# Patient Record
Sex: Female | Born: 1975
Health system: Southern US, Community
[De-identification: ages and names within clinical notes are randomized; demographics above are authoritative.]

## PROBLEM LIST (undated history)

## (undated) DIAGNOSIS — F32A Depression, unspecified: Secondary | ICD-10-CM

## (undated) DIAGNOSIS — R079 Chest pain, unspecified: Secondary | ICD-10-CM

## (undated) DIAGNOSIS — R102 Pelvic and perineal pain: Secondary | ICD-10-CM

## (undated) DIAGNOSIS — K219 Gastro-esophageal reflux disease without esophagitis: Secondary | ICD-10-CM

## (undated) DIAGNOSIS — R51 Headache: Secondary | ICD-10-CM

## (undated) DIAGNOSIS — N289 Disorder of kidney and ureter, unspecified: Secondary | ICD-10-CM

## (undated) DIAGNOSIS — G8929 Other chronic pain: Secondary | ICD-10-CM

## (undated) DIAGNOSIS — M5030 Other cervical disc degeneration, unspecified cervical region: Secondary | ICD-10-CM

## (undated) DIAGNOSIS — G629 Polyneuropathy, unspecified: Secondary | ICD-10-CM

## (undated) DIAGNOSIS — R42 Dizziness and giddiness: Secondary | ICD-10-CM

## (undated) DIAGNOSIS — Z9289 Personal history of other medical treatment: Secondary | ICD-10-CM

## (undated) DIAGNOSIS — I10 Essential (primary) hypertension: Secondary | ICD-10-CM

## (undated) DIAGNOSIS — F329 Major depressive disorder, single episode, unspecified: Secondary | ICD-10-CM

## (undated) DIAGNOSIS — R519 Headache, unspecified: Secondary | ICD-10-CM

## (undated) DIAGNOSIS — K760 Fatty (change of) liver, not elsewhere classified: Secondary | ICD-10-CM

## (undated) HISTORY — DX: Fatty (change of) liver, not elsewhere classified: K76.0

## (undated) HISTORY — DX: Major depressive disorder, single episode, unspecified: F32.9

## (undated) HISTORY — DX: Depression, unspecified: F32.A

## (undated) HISTORY — PX: ROTATOR CUFF REPAIR: SHX139

## (undated) HISTORY — PX: BREAST BIOPSY: SHX20

## (undated) HISTORY — PX: TUBAL LIGATION: SHX77

## (undated) HISTORY — DX: Gastro-esophageal reflux disease without esophagitis: K21.9

---

## 1998-11-03 ENCOUNTER — Ambulatory Visit (HOSPITAL_COMMUNITY): Admission: RE | Admit: 1998-11-03 | Discharge: 1998-11-03 | Payer: Self-pay | Admitting: *Deleted

## 1998-12-08 ENCOUNTER — Inpatient Hospital Stay (HOSPITAL_COMMUNITY): Admission: AD | Admit: 1998-12-08 | Discharge: 1998-12-08 | Payer: Self-pay | Admitting: *Deleted

## 1999-01-14 ENCOUNTER — Ambulatory Visit (HOSPITAL_COMMUNITY): Admission: RE | Admit: 1999-01-14 | Discharge: 1999-01-14 | Payer: Self-pay | Admitting: Obstetrics

## 1999-02-20 ENCOUNTER — Inpatient Hospital Stay (HOSPITAL_COMMUNITY): Admission: AD | Admit: 1999-02-20 | Discharge: 1999-02-20 | Payer: Self-pay | Admitting: *Deleted

## 1999-04-01 ENCOUNTER — Encounter: Payer: Self-pay | Admitting: Obstetrics & Gynecology

## 1999-04-01 ENCOUNTER — Inpatient Hospital Stay (HOSPITAL_COMMUNITY): Admission: AD | Admit: 1999-04-01 | Discharge: 1999-04-07 | Payer: Self-pay | Admitting: Obstetrics & Gynecology

## 1999-04-15 ENCOUNTER — Inpatient Hospital Stay (HOSPITAL_COMMUNITY): Admission: AD | Admit: 1999-04-15 | Discharge: 1999-04-15 | Payer: Self-pay | Admitting: Obstetrics & Gynecology

## 2003-06-23 ENCOUNTER — Emergency Department (HOSPITAL_COMMUNITY): Admission: AD | Admit: 2003-06-23 | Discharge: 2003-06-23 | Payer: Self-pay | Admitting: Family Medicine

## 2003-06-25 ENCOUNTER — Ambulatory Visit (HOSPITAL_COMMUNITY): Admission: RE | Admit: 2003-06-25 | Discharge: 2003-06-25 | Payer: Self-pay | Admitting: Family Medicine

## 2003-12-02 ENCOUNTER — Ambulatory Visit: Payer: Self-pay | Admitting: Nurse Practitioner

## 2004-01-02 ENCOUNTER — Ambulatory Visit: Payer: Self-pay | Admitting: Nurse Practitioner

## 2004-01-28 ENCOUNTER — Ambulatory Visit: Payer: Self-pay | Admitting: Nurse Practitioner

## 2004-08-13 ENCOUNTER — Ambulatory Visit (HOSPITAL_COMMUNITY): Admission: RE | Admit: 2004-08-13 | Discharge: 2004-08-13 | Payer: Self-pay | Admitting: *Deleted

## 2004-10-21 ENCOUNTER — Ambulatory Visit: Payer: Self-pay | Admitting: *Deleted

## 2004-10-27 ENCOUNTER — Ambulatory Visit (HOSPITAL_COMMUNITY): Admission: RE | Admit: 2004-10-27 | Discharge: 2004-10-27 | Payer: Self-pay | Admitting: *Deleted

## 2004-10-27 ENCOUNTER — Ambulatory Visit: Payer: Self-pay | Admitting: *Deleted

## 2004-12-22 ENCOUNTER — Ambulatory Visit: Payer: Self-pay | Admitting: *Deleted

## 2004-12-24 ENCOUNTER — Inpatient Hospital Stay (HOSPITAL_COMMUNITY): Admission: AD | Admit: 2004-12-24 | Discharge: 2004-12-27 | Payer: Self-pay | Admitting: Obstetrics & Gynecology

## 2004-12-24 ENCOUNTER — Ambulatory Visit: Payer: Self-pay | Admitting: Obstetrics & Gynecology

## 2004-12-24 ENCOUNTER — Encounter (INDEPENDENT_AMBULATORY_CARE_PROVIDER_SITE_OTHER): Payer: Self-pay | Admitting: Specialist

## 2005-06-13 ENCOUNTER — Ambulatory Visit: Payer: Self-pay | Admitting: Nurse Practitioner

## 2005-08-05 ENCOUNTER — Ambulatory Visit: Payer: Self-pay | Admitting: *Deleted

## 2007-02-19 ENCOUNTER — Encounter (INDEPENDENT_AMBULATORY_CARE_PROVIDER_SITE_OTHER): Payer: Self-pay | Admitting: Family Medicine

## 2007-02-19 ENCOUNTER — Ambulatory Visit: Payer: Self-pay | Admitting: Family Medicine

## 2007-02-19 LAB — CONVERTED CEMR LAB
Antibody Screen: NEGATIVE
Eosinophils Relative: 1 % (ref 0–5)
HCT: 39 % (ref 36.0–46.0)
Hemoglobin: 12.7 g/dL (ref 12.0–15.0)
Lymphocytes Relative: 22 % (ref 12–46)
Lymphs Abs: 1.9 10*3/uL (ref 0.7–4.0)
Monocytes Absolute: 0.4 10*3/uL (ref 0.1–1.0)
Monocytes Relative: 5 % (ref 3–12)
Neutro Abs: 6.1 10*3/uL (ref 1.7–7.7)
RBC: 4.75 M/uL (ref 3.87–5.11)
RDW: 16.6 % — ABNORMAL HIGH (ref 11.5–15.5)
Rh Type: POSITIVE
Rubella: 83.5 intl units/mL — ABNORMAL HIGH

## 2007-02-20 ENCOUNTER — Encounter: Payer: Self-pay | Admitting: Family Medicine

## 2007-02-20 ENCOUNTER — Encounter (INDEPENDENT_AMBULATORY_CARE_PROVIDER_SITE_OTHER): Payer: Self-pay | Admitting: Family Medicine

## 2007-02-20 ENCOUNTER — Ambulatory Visit: Payer: Self-pay | Admitting: Family Medicine

## 2007-02-20 DIAGNOSIS — E119 Type 2 diabetes mellitus without complications: Secondary | ICD-10-CM

## 2007-02-20 LAB — CONVERTED CEMR LAB
ALT: 25 units/L (ref 0–35)
CO2: 21 meq/L (ref 19–32)
Calcium: 8.4 mg/dL (ref 8.4–10.5)
Chloride: 108 meq/L (ref 96–112)
Creatinine, Ser: 0.45 mg/dL (ref 0.40–1.20)
Glucose, Bld: 100 mg/dL — ABNORMAL HIGH (ref 70–99)
Hgb A1c MFr Bld: 7.6 %
TSH: 1.815 microintl units/mL (ref 0.350–5.50)
Total Bilirubin: 0.4 mg/dL (ref 0.3–1.2)
Uric Acid, Serum: 2.8 mg/dL (ref 2.4–7.0)

## 2007-02-21 ENCOUNTER — Encounter (INDEPENDENT_AMBULATORY_CARE_PROVIDER_SITE_OTHER): Payer: Self-pay | Admitting: *Deleted

## 2007-02-23 ENCOUNTER — Encounter (INDEPENDENT_AMBULATORY_CARE_PROVIDER_SITE_OTHER): Payer: Self-pay | Admitting: Family Medicine

## 2007-03-05 ENCOUNTER — Encounter: Payer: Self-pay | Admitting: *Deleted

## 2007-03-06 ENCOUNTER — Ambulatory Visit: Payer: Self-pay | Admitting: Family Medicine

## 2007-03-06 LAB — CONVERTED CEMR LAB: Protein, U semiquant: NEGATIVE

## 2007-03-14 ENCOUNTER — Encounter (INDEPENDENT_AMBULATORY_CARE_PROVIDER_SITE_OTHER): Payer: Self-pay | Admitting: *Deleted

## 2007-03-19 ENCOUNTER — Encounter: Payer: Self-pay | Admitting: Obstetrics & Gynecology

## 2007-03-19 ENCOUNTER — Encounter: Admission: RE | Admit: 2007-03-19 | Discharge: 2007-03-19 | Payer: Self-pay | Admitting: Obstetrics & Gynecology

## 2007-03-19 ENCOUNTER — Ambulatory Visit: Payer: Self-pay | Admitting: Obstetrics & Gynecology

## 2007-03-26 ENCOUNTER — Ambulatory Visit (HOSPITAL_COMMUNITY): Admission: RE | Admit: 2007-03-26 | Discharge: 2007-03-26 | Payer: Self-pay | Admitting: Obstetrics & Gynecology

## 2007-03-26 ENCOUNTER — Ambulatory Visit: Payer: Self-pay | Admitting: Obstetrics & Gynecology

## 2007-04-02 ENCOUNTER — Ambulatory Visit: Payer: Self-pay | Admitting: Family Medicine

## 2007-04-04 ENCOUNTER — Encounter: Payer: Self-pay | Admitting: *Deleted

## 2007-04-09 ENCOUNTER — Ambulatory Visit (HOSPITAL_COMMUNITY): Admission: RE | Admit: 2007-04-09 | Discharge: 2007-04-09 | Payer: Self-pay | Admitting: Gynecology

## 2007-04-16 ENCOUNTER — Ambulatory Visit: Payer: Self-pay | Admitting: Obstetrics & Gynecology

## 2007-07-23 ENCOUNTER — Ambulatory Visit: Payer: Self-pay | Admitting: Obstetrics & Gynecology

## 2007-07-23 ENCOUNTER — Encounter: Payer: Self-pay | Admitting: Obstetrics & Gynecology

## 2007-07-23 ENCOUNTER — Inpatient Hospital Stay (HOSPITAL_COMMUNITY): Admission: RE | Admit: 2007-07-23 | Discharge: 2007-07-26 | Payer: Self-pay | Admitting: Obstetrics & Gynecology

## 2007-07-30 ENCOUNTER — Ambulatory Visit: Payer: Self-pay | Admitting: Obstetrics & Gynecology

## 2007-08-08 ENCOUNTER — Inpatient Hospital Stay (HOSPITAL_COMMUNITY): Admission: AD | Admit: 2007-08-08 | Discharge: 2007-08-08 | Payer: Self-pay | Admitting: Obstetrics and Gynecology

## 2007-08-17 ENCOUNTER — Telehealth (INDEPENDENT_AMBULATORY_CARE_PROVIDER_SITE_OTHER): Payer: Self-pay | Admitting: Family Medicine

## 2007-08-17 ENCOUNTER — Encounter (INDEPENDENT_AMBULATORY_CARE_PROVIDER_SITE_OTHER): Payer: Self-pay | Admitting: *Deleted

## 2007-08-24 ENCOUNTER — Ambulatory Visit: Payer: Self-pay | Admitting: Family Medicine

## 2007-08-24 ENCOUNTER — Encounter (INDEPENDENT_AMBULATORY_CARE_PROVIDER_SITE_OTHER): Payer: Self-pay | Admitting: Family Medicine

## 2007-08-24 LAB — CONVERTED CEMR LAB
Bilirubin Urine: NEGATIVE
Glucose, Urine, Semiquant: NEGATIVE
Hgb A1c MFr Bld: 5.8 %
Urobilinogen, UA: 0.2
WBC Urine, dipstick: NEGATIVE
pH: 5.5

## 2007-08-29 ENCOUNTER — Encounter (INDEPENDENT_AMBULATORY_CARE_PROVIDER_SITE_OTHER): Payer: Self-pay | Admitting: *Deleted

## 2008-05-29 ENCOUNTER — Ambulatory Visit: Payer: Self-pay | Admitting: Family Medicine

## 2008-05-29 DIAGNOSIS — M549 Dorsalgia, unspecified: Secondary | ICD-10-CM | POA: Insufficient documentation

## 2008-07-23 ENCOUNTER — Encounter (INDEPENDENT_AMBULATORY_CARE_PROVIDER_SITE_OTHER): Payer: Self-pay | Admitting: Family Medicine

## 2008-07-23 ENCOUNTER — Ambulatory Visit: Payer: Self-pay | Admitting: Family Medicine

## 2008-07-25 LAB — CONVERTED CEMR LAB
ALT: 44 units/L — ABNORMAL HIGH (ref 0–35)
AST: 43 units/L — ABNORMAL HIGH (ref 0–37)
Alkaline Phosphatase: 96 units/L (ref 39–117)
CO2: 26 meq/L (ref 19–32)
LDL Cholesterol: 88 mg/dL (ref 0–99)
Sodium: 143 meq/L (ref 135–145)
Total Bilirubin: 0.4 mg/dL (ref 0.3–1.2)
Total CHOL/HDL Ratio: 3.8
Total Protein: 7.3 g/dL (ref 6.0–8.3)
VLDL: 23 mg/dL (ref 0–40)

## 2008-07-29 ENCOUNTER — Ambulatory Visit: Payer: Self-pay | Admitting: Family Medicine

## 2008-07-29 ENCOUNTER — Encounter (INDEPENDENT_AMBULATORY_CARE_PROVIDER_SITE_OTHER): Payer: Self-pay | Admitting: Family Medicine

## 2008-07-30 LAB — CONVERTED CEMR LAB
Bilirubin, Direct: 0.1 mg/dL (ref 0.0–0.3)
HCV Ab: NEGATIVE
Hep B S Ab: NEGATIVE
Hepatitis B Surface Ag: NEGATIVE
Indirect Bilirubin: 0.2 mg/dL (ref 0.0–0.9)
Total Bilirubin: 0.3 mg/dL (ref 0.3–1.2)

## 2008-08-21 ENCOUNTER — Telehealth: Payer: Self-pay | Admitting: *Deleted

## 2008-09-17 ENCOUNTER — Ambulatory Visit: Payer: Self-pay | Admitting: Family Medicine

## 2008-09-17 DIAGNOSIS — K219 Gastro-esophageal reflux disease without esophagitis: Secondary | ICD-10-CM

## 2008-09-23 ENCOUNTER — Ambulatory Visit: Payer: Self-pay | Admitting: Family Medicine

## 2008-09-23 LAB — CONVERTED CEMR LAB: Protein, U semiquant: 30

## 2008-09-25 ENCOUNTER — Encounter: Payer: Self-pay | Admitting: Family Medicine

## 2008-12-04 ENCOUNTER — Ambulatory Visit: Payer: Self-pay | Admitting: Family Medicine

## 2008-12-16 ENCOUNTER — Encounter: Payer: Self-pay | Admitting: Family Medicine

## 2008-12-16 ENCOUNTER — Ambulatory Visit: Payer: Self-pay | Admitting: Family Medicine

## 2008-12-16 DIAGNOSIS — R109 Unspecified abdominal pain: Secondary | ICD-10-CM | POA: Insufficient documentation

## 2008-12-16 LAB — CONVERTED CEMR LAB
Bilirubin Urine: NEGATIVE
Chlamydia, DNA Probe: NEGATIVE
Specific Gravity, Urine: >=1.03
WBC Urine, dipstick: NEGATIVE
pH: 5.5

## 2008-12-18 ENCOUNTER — Encounter (INDEPENDENT_AMBULATORY_CARE_PROVIDER_SITE_OTHER): Payer: Self-pay | Admitting: *Deleted

## 2009-01-23 ENCOUNTER — Telehealth: Payer: Self-pay | Admitting: *Deleted

## 2009-02-16 ENCOUNTER — Telehealth: Payer: Self-pay | Admitting: *Deleted

## 2009-02-18 ENCOUNTER — Ambulatory Visit: Payer: Self-pay | Admitting: Family Medicine

## 2009-02-18 DIAGNOSIS — B353 Tinea pedis: Secondary | ICD-10-CM

## 2009-04-06 ENCOUNTER — Ambulatory Visit: Payer: Self-pay | Admitting: Family Medicine

## 2009-04-14 ENCOUNTER — Encounter: Payer: Self-pay | Admitting: *Deleted

## 2009-04-28 ENCOUNTER — Ambulatory Visit: Payer: Self-pay | Admitting: Family Medicine

## 2009-04-28 DIAGNOSIS — H11159 Pinguecula, unspecified eye: Secondary | ICD-10-CM

## 2009-05-18 ENCOUNTER — Ambulatory Visit: Payer: Self-pay | Admitting: Family Medicine

## 2009-05-18 LAB — CONVERTED CEMR LAB: Hgb A1c MFr Bld: 8.8 %

## 2009-06-01 ENCOUNTER — Ambulatory Visit: Payer: Self-pay | Admitting: Family Medicine

## 2009-06-18 ENCOUNTER — Ambulatory Visit: Payer: Self-pay | Admitting: Family Medicine

## 2009-06-22 ENCOUNTER — Ambulatory Visit: Payer: Self-pay | Admitting: Family Medicine

## 2009-07-20 ENCOUNTER — Ambulatory Visit: Payer: Self-pay | Admitting: Family Medicine

## 2009-08-24 ENCOUNTER — Ambulatory Visit: Payer: Self-pay | Admitting: Family Medicine

## 2009-08-24 DIAGNOSIS — N926 Irregular menstruation, unspecified: Secondary | ICD-10-CM | POA: Insufficient documentation

## 2009-08-24 DIAGNOSIS — R519 Headache, unspecified: Secondary | ICD-10-CM | POA: Insufficient documentation

## 2009-08-24 DIAGNOSIS — R51 Headache: Secondary | ICD-10-CM

## 2009-09-08 ENCOUNTER — Ambulatory Visit: Payer: Self-pay | Admitting: Family Medicine

## 2009-09-08 ENCOUNTER — Encounter: Payer: Self-pay | Admitting: Family Medicine

## 2009-09-08 DIAGNOSIS — J029 Acute pharyngitis, unspecified: Secondary | ICD-10-CM

## 2009-09-08 LAB — CONVERTED CEMR LAB: Rapid Strep: POSITIVE

## 2009-09-09 ENCOUNTER — Emergency Department (HOSPITAL_COMMUNITY): Admission: EM | Admit: 2009-09-09 | Discharge: 2009-09-09 | Payer: Self-pay | Admitting: Emergency Medicine

## 2009-09-09 ENCOUNTER — Emergency Department (HOSPITAL_COMMUNITY): Admission: EM | Admit: 2009-09-09 | Discharge: 2009-09-10 | Payer: Self-pay | Admitting: Emergency Medicine

## 2009-09-15 ENCOUNTER — Ambulatory Visit: Payer: Self-pay | Admitting: Family Medicine

## 2009-09-15 ENCOUNTER — Encounter: Payer: Self-pay | Admitting: Family Medicine

## 2009-09-15 DIAGNOSIS — K5289 Other specified noninfective gastroenteritis and colitis: Secondary | ICD-10-CM

## 2009-09-15 LAB — CONVERTED CEMR LAB
BUN: 8 mg/dL (ref 6–23)
CO2: 25 meq/L (ref 19–32)
Calcium: 9.4 mg/dL (ref 8.4–10.5)
Chloride: 102 meq/L (ref 96–112)
Glucose, Bld: 102 mg/dL — ABNORMAL HIGH (ref 70–99)
Sodium: 138 meq/L (ref 135–145)

## 2009-09-28 ENCOUNTER — Ambulatory Visit: Payer: Self-pay | Admitting: Family Medicine

## 2009-12-10 ENCOUNTER — Encounter: Payer: Self-pay | Admitting: Family Medicine

## 2009-12-10 ENCOUNTER — Ambulatory Visit: Payer: Self-pay | Admitting: Family Medicine

## 2009-12-10 DIAGNOSIS — M25569 Pain in unspecified knee: Secondary | ICD-10-CM

## 2009-12-10 DIAGNOSIS — R300 Dysuria: Secondary | ICD-10-CM | POA: Insufficient documentation

## 2009-12-10 LAB — CONVERTED CEMR LAB
Bilirubin Urine: NEGATIVE
Ketones, urine, test strip: NEGATIVE
Nitrite: NEGATIVE
Protein, U semiquant: 100
Urobilinogen, UA: 0.2
WBC, UA: >20 cells/hpf

## 2009-12-17 ENCOUNTER — Ambulatory Visit: Payer: Self-pay | Admitting: Family Medicine

## 2009-12-17 LAB — CONVERTED CEMR LAB
Glucose, Urine, Semiquant: NEGATIVE
Nitrite: NEGATIVE
Specific Gravity, Urine: >=1.03
WBC Urine, dipstick: NEGATIVE
pH: 5.5

## 2010-02-08 ENCOUNTER — Ambulatory Visit: Payer: Self-pay | Admitting: Family Medicine

## 2010-02-08 DIAGNOSIS — K625 Hemorrhage of anus and rectum: Secondary | ICD-10-CM

## 2010-02-08 DIAGNOSIS — N760 Acute vaginitis: Secondary | ICD-10-CM | POA: Insufficient documentation

## 2010-02-08 DIAGNOSIS — K055 Other periodontal diseases: Secondary | ICD-10-CM

## 2010-02-08 LAB — CONVERTED CEMR LAB: Whiff Test: NEGATIVE

## 2010-03-31 ENCOUNTER — Ambulatory Visit: Admission: RE | Admit: 2010-03-31 | Discharge: 2010-03-31 | Payer: Self-pay | Source: Home / Self Care

## 2010-03-31 DIAGNOSIS — K089 Disorder of teeth and supporting structures, unspecified: Secondary | ICD-10-CM | POA: Insufficient documentation

## 2010-04-03 ENCOUNTER — Emergency Department (HOSPITAL_COMMUNITY)
Admission: EM | Admit: 2010-04-03 | Discharge: 2010-04-03 | Payer: Self-pay | Source: Home / Self Care | Admitting: Emergency Medicine

## 2010-04-04 ENCOUNTER — Encounter: Payer: Self-pay | Admitting: *Deleted

## 2010-04-06 LAB — URINALYSIS, ROUTINE W REFLEX MICROSCOPIC
Ketones, ur: NEGATIVE mg/dL
Protein, ur: 30 mg/dL — AB
Urine Glucose, Fasting: >1000 mg/dL — AB
pH: 6 (ref 5.0–8.0)

## 2010-04-06 LAB — URINE MICROSCOPIC-ADD ON

## 2010-04-07 ENCOUNTER — Ambulatory Visit: Admit: 2010-04-07 | Payer: Self-pay

## 2010-04-07 LAB — URINE CULTURE: Culture  Setup Time: 201201211732

## 2010-04-14 NOTE — Assessment & Plan Note (Addendum)
Summary: nutr. appt/eo   Vital Signs:  Patient profile:   35 year old female Height:      63 inches Weight:      228.3 pounds BMI:     40.59  Vitals Entered By: Wyona Almas PHD (Jul 20, 2009 10:03 AM)  Primary Care Provider:  Ellery Plunk MD   History of Present Illness: Assessment:  Spent 30 minutes with pt.  [name] has been walking 30 minutes 2-3 X DAY.  Food changes she has made include avoiding tortillas, soda, and sweets.  FBS have been 100-115, which is a good reflection of dietary and exercise changes, but her weight has not gone down.  24-hr recall suggests kcal intake of  ~1500: B (8:30 AM)- 1 c hot chocolate; Snk (10 AM)- 1 apple, 1 slice toast, water; L (2 PM)- chx w/ broth, 1 slc toast, water; Snk (3:30 PM)- 1 small mango; Snk (6 PM)- handful of peanuts, water, D (7:30 PM)- steak, 1 1/2 c potatoes, onion, tomato, peppers; Snk (8 PM) - cantaloupe.  She is not always getting veg's at lunch time, and is usually not eating breakfast.  In fact, she sometimes skips other meals, related to depression.  She mentioned today still feeling very sad at her mother's death; it will be 3 months tomorrow, which is Mother's Day in [place].   Nutrition Diagnosis: Continued progress on physical inactivity (NB-2.1) as evidenced by walking up to 3 X day.  Progress on excessive energy intake (NI-1.5) related to expenditure as evidenced by 24-hr recall of 1500 kcal.   Intervention: See Patient Instructions.    Monitoring/Eval:  Dietary intake, body weight, and exercise at 4 to 6-wk F/U WITH INTERPRETOR.      Allergies: No Known Drug Allergies   Complete Medication List: 1)  Glyburide 5 Mg Tabs (Glyburide) .... Take two tablets daily 2)  True Track Meter Strips and Lancets  .... Check 4 times daily. disp 1 month supply 3)  Clotrimazole 1 % Crea (Clotrimazole) .... Apply topical two times a day.  disp qs x1 month. 4)  Lac-hydrin 12 % Lotn (Ammonium lactate) .... Apply to feet 4 times  daily.  disp qs x1 month. 5)  Metformin Hcl 1000 Mg Tabs (Metformin hcl) .... Take one two times a day 6)  Lisinopril 5 Mg Tabs (Lisinopril) .... Take one daily 7)  Colace 100 Mg Caps (Docusate sodium) .... Take one two times a day  Other [facility]  Patient Instructions: 1)  Vegetables at lunch and at dinner.   2)  Great job on all the walking.  Keep up the good work.  Getting outside to walk may be helpful when you feel depressed, even when you don't really want to go.   3)  Try to get some food for breakfast each day, such as yogurt or even a piece of fruit.   4)  Strawberries are especially good for you!   5)  Continue to limit your starchy foods to one or two per meals.   6)  Please schedule at the front desk for an appointment in 4-6 weeks.

## 2010-04-14 NOTE — Miscellaneous (Addendum)
Summary: Re; Ophthalmology referral.  Clinical Lists Changes  received notification from [facility] that they are unable to complete the ophthalmology referral at this time due to lack of volunteer physicians in this specialty group. they will notify patient we they can process this claim. Theresia Lo RN  April 14, 2009 11:03 AM

## 2010-04-14 NOTE — Assessment & Plan Note (Addendum)
Summary: DM/UTI/knee pain   Vital Signs:  Patient profile:   35 year old female Height:      63 inches Weight:      217.3 pounds BMI:     38.63 Temp:     98.3 degrees F oral Pulse rate:   68 / minute BP sitting:   92 / 62  (left arm) Cuff size:   large  Vitals Entered By: Garen Grams LPN (December 10, 2009 9:24 AM) CC: DM, UTI, and knee pain Is Patient Diabetic? Yes Did you bring your meter with you today? No Pain Assessment Patient in pain? no        Primary Care Provider:  Ellery Plunk MD  CC:  DM, UTI, and and knee pain.  History of Present Illness: 1. DMII:  She has been taking her medicines as prescribed.  She hasn't been able to check her blood sugars because she ran out of the strips.    ROS: Denies numbness / weakness, vision changes  2. UTI:  For the past 1 month she has had burning when she urinates.  She also endorses increased frequency and some urine incontinence.  Has not been taking anything for it  ROS: Denies fevers, malaise, hematuria.  Endorses some chronic back pain.  3. Bilateral knee pain:  She has had bilateral knee pain for the past 3 months.  The pain is in the anterior part of her knee right around the knee caps.  It is worse in th morning and hurts when she goes up stairs.  ROS: Denies catching, locking, or giving out  Habits & Providers  Alcohol-Tobacco-Diet     Tobacco Status: never     Cigarette Packs/Day: n/a  Current Medications (verified): 1)  Glyburide 5 Mg Tabs (Glyburide) .... Take Two Tablets Daily 2)  True Track Meter Strips and Lancets .... Check 4 Times Daily. Disp 1 Month Supply 3)  Clotrimazole 1 % Crea (Clotrimazole) .... Apply Topical Two Times A Day.  Disp Qs X1 Month. 4)  Lac-Hydrin 12 % Lotn (Ammonium Lactate) .... Apply To Feet 4 Times Daily.  Disp Qs X1 Month. 5)  Metformin Hcl 1000 Mg Tabs (Metformin Hcl) .... Take One Two Times A Day 6)  Lisinopril 5 Mg Tabs (Lisinopril) .... Take One Daily 7)  Colace 100 Mg  Caps (Docusate Sodium) .... Take One Two Times A Day 8)  Penicillin V Potassium 500 Mg Tabs (Penicillin V Potassium) .... One Tab By Mouth Three Times A Day X 10 Days 9)  Promethazine Hcl 12.5 Mg Tabs (Promethazine Hcl) .... Take 1 Every 6 Hours As Needed For Nausea  Allergies: No Known Drug Allergies  Past History:  Past Medical History: Reviewed history from 05/18/2009 and no changes required. DM - Had GDM with first pregnancy and then later diagnosed with DM. [facility] years ago.  Now with microalbuminuria.  Social History: Reviewed history from 02/20/2007 and no changes required. No smoking, alcohol, or illegal drugs. 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   Physical Exam  General:  vitals reviewed.  Afebrile. no acute distress Lungs:  normal respiratory effort and normal breath sounds.   Heart:  normal rate, regular rhythm, and no murmur.   Abdomen:  + suprapubic tenderness.  + left flank tenderness, no right flank tenderness.  otherwise s/nt/nd.  +bs Msk:  right knee:  No swelling, redness, or deformity.  + Crepitation under the patella with knee extension.  Pain with palpation of the  patella.  No joint line tenderness.  Good stability.  left knee:  No swelling, redness, or deformity.  + Crepitation under the patella with knee extension.  Pain with palpation of the patella.  No joint line tenderness.  Good stability. Extremities:  no lower extremity edema   Impression & Recommendations:  Problem # 1:  DYSURIA (ICD-788.1) Assessment New UA not that impressive.  She did have a lot of WBCs and small blood which is concerning.  She also had flank pain on exam and I'm not sure if this is new or part of her chronic back pain that she endorses.  I am most concerned about early pyelo since she has had this dysuria for 1 month.  I am going to treat aggressively since she has trouble getting into clinic.  I will give her a shot of CTX and then treat with  Cipro.  Would like for her to come back in 1 week to recheck.  Will send urine for culture. Her updated medication list for this problem includes:    Penicillin V Potassium 500 Mg Tabs (Penicillin v potassium) ..... One tab by mouth three times a day x 10 days    Ciprofloxacin Hcl 500 Mg Tabs (Ciprofloxacin hcl) .Marland Kitchen... 1 tab by mouth twice a day for 7 days  Orders: Urinalysis-[facility] (00000) Urine Culture-[facility] ([address]) Rocephin  250mg  (U9811) [facility]- Est  Level 4 (91478)  Problem # 2:  KNEE PAIN, BILATERAL (GNF-621.30) Assessment: New  Exam most consistent with bilateral patellar arthritis or patellofemoral syndrome.  Advised Tylenol / Ibuprofen as needed.  Also provided prescription for knee sleaves.  Orders: [facility]- Est  Level 4 (86578)  Complete Medication List: 1)  Glyburide 5 Mg Tabs (Glyburide) .... Take two tablets daily 2)  True Track Meter Strips and Lancets  .... Check 4 times daily. disp 1 month supply 3)  Clotrimazole 1 % Crea (Clotrimazole) .... Apply topical two times a day.  disp qs x1 month. 4)  Lac-hydrin 12 % Lotn (Ammonium lactate) .... Apply to feet 4 times daily.  disp qs x1 month. 5)  Metformin Hcl 1000 Mg Tabs (Metformin hcl) .... Take one two times a day 6)  Lisinopril 5 Mg Tabs (Lisinopril) .... Take one daily 7)  Colace 100 Mg Caps (Docusate sodium) .... Take one two times a day 8)  Penicillin V Potassium 500 Mg Tabs (Penicillin v potassium) .... One tab by mouth three times a day x 10 days 9)  Promethazine Hcl 12.5 Mg Tabs (Promethazine hcl) .... Take 1 every 6 hours as needed for nausea 10)  Ciprofloxacin Hcl 500 Mg Tabs (Ciprofloxacin hcl) .Marland Kitchen.. 1 tab by mouth twice a day for 7 days  Other Orders: A1C-[facility] (46962)  Patient Instructions: 1)  I think that you probably do have a urinary tract infection 2)  Please take the antibiotics as directed 3)  Your knee pain is likely caused by patellar arthritis.  Tylenol Arthritis and Ibuprofen can both be taken for  this 4)  Please schedule a follow up appointment in 1 week to see how you are doing Prescriptions: CIPROFLOXACIN HCL 500 MG TABS (CIPROFLOXACIN HCL) 1 tab by mouth twice a day for 7 days  #14 x 0   Entered and Authorized by:   Angelena Sole MD   Signed by:   Angelena Sole MD on 12/10/2009   Method used:   Print then Give to Patient   RxID:   [id number] METFORMIN HCL 1000 MG TABS (METFORMIN HCL) take  one two times a day  #60 x 4   Entered and Authorized by:   Angelena Sole MD   Signed by:   Angelena Sole MD on 12/10/2009   Method used:   Print then Give to Patient   RxID:   [id number] GLYBURIDE 5 MG TABS (GLYBURIDE) take two tablets daily  #60 x 4   Entered and Authorized by:   Angelena Sole MD   Signed by:   Angelena Sole MD on 12/10/2009   Method used:   Print then Give to Patient   RxID:   [id number]   Laboratory Results   Urine Tests  Date/Time Received: December 10, 2009 9:08 AM  Date/Time Reported: December 10, 2009 9:49 AM   Routine Urinalysis   Color: yellow Appearance: Clear Glucose: negative   (Normal Range: Negative) Bilirubin: small;  reflex ictotest = negative   (Normal Range: Negative) Ketone: negative   (Normal Range: Negative) Spec. Gravity: >=1.030   (Normal Range: 1.003-1.035) Blood: small   (Normal Range: Negative) pH: 6.0   (Normal Range: 5.0-8.0) Protein: 100   (Normal Range: Negative) Urobilinogen: 0.2   (Normal Range: 0-1) Nitrite: negative   (Normal Range: Negative) Leukocyte Esterace: negative   (Normal Range: Negative)  Urine Microscopic WBC/HPF: >20 RBC/HPF: 0-3 Bacteria/HPF: 2+ rods and cocci Mucous/HPF: 2+ Epithelial/HPF: 15-25    Comments: ...............test performed by......Marland KitchenBonnie A. [place], MLS (ASCP)cm   Blood Tests   Date/Time Received: December 10, 2009 9:08 AM  Date/Time Reported: December 10, 2009 9:49 AM   HGBA1C: 7.4%   (Normal Range: Non-Diabetic - 3-6%   Control Diabetic -  6-8%)  Comments: ...............test performed by......Marland KitchenBonnie A. [place], MLS (ASCP)cm     Medication Administration  Injection # 1:    Medication: Rocephin  250mg     Diagnosis: DYSURIA (ICD-788.1)    Route: IM    Site: RUOQ gluteus    Exp Date: 10/13/2010    Lot #: [id number]    Mfr: baxter    Comments: Meds given in 2 shots.  One in [place] and one in [place].  In total 1 gram of rocephin given    Patient tolerated injection without complications    Given by: Jone Baseman CMA (December 10, 2009 10:29 AM)  Orders Added: 1)  A1C-[facility] [83036] 2)  Urinalysis-[facility] [00000] 3)  Urine Culture-[facility] [[id number]] 4)  Rocephin  250mg  [J0696] 5)  [facility]- Est  Level 4 [10272]

## 2010-04-14 NOTE — Assessment & Plan Note (Addendum)
Summary: F/U  UTI PER [facility]DERS/SPIEGEL NOT AVAIL/KH   Vital Signs:  Patient profile:   35 year old female Height:      63 inches Weight:      219.1 pounds BMI:     38.95 Temp:     98.3 degrees F oral Pulse rate:   78 / minute BP sitting:   123 / 77  (left arm) Cuff size:   large  Vitals Entered By: Garen Grams LPN (December 17, 2009 2:18 PM) CC: f/u UTI Is Patient Diabetic? Yes Did you bring your meter with you today? No Pain Assessment Patient in pain? no        Primary Care Provider:  Ellery Plunk MD  CC:  f/u UTI.  History of Present Illness: 1) UTI: Seen 9/29 by Dr. Lelon Perla for dysuria, left flank pain - treated with Rocephin x 1 dose, ciprofloxacin x 7 days for concern for early pyelonephritis. Urine culture with E Coli sens to cipro, rocephin. Continues to have dysuria and left flank pain which is unchanged from prior. Also reports some increased urge and increased urinary frequency (this is somewhat improved since starting antibiotics). Pain somewhat relieved by Tylenol.   ROS: Denies fever, chills, nausea, vomiting or diarrhea, constipation, hematuria, pyuria, vaginal discharge, lethargy     Habits & Providers  Alcohol-Tobacco-Diet     Tobacco Status: never     Cigarette Packs/Day: n/a  Allergies (verified): No Known Drug Allergies  Past History:  Past Medical History: Last updated: 05/18/2009 DM - Had GDM with first pregnancy and then later diagnosed with DM. [facility] at [facility] years ago.  Now with microalbuminuria.  Physical Exam  General:  vitals reviewed.  afebrile. no acute distress Mouth:  moist membranes  Lungs:  normal respiratory effort and normal breath sounds.   Heart:  normal rate, regular rhythm, and no murmur.   Abdomen:  + suprapubic tenderness.  + left flank tenderness with mild guarding.  no right flank tenderness.  otherwise s/nt/nd.  +bs Pulses:  2+ radials  Extremities:  wel perfused  Skin:  warm and dry     Impression & Recommendations:  Problem # 1:  DYSURIA (ICD-788.1) Assessment Unchanged Will continue course of ciprofloxacin as below. Will treat for concurrent yeats infection (based on urine as well). Afebrile / vital signs stable, however still concern for pyelonephritis as cause of flank pain. Will have follow up in one week - if no improvement in symptoms WOULD CONSIDER U/S at that time. Tylenol / motrin for pain. Red flags that would prompt return to care reviewed with patient.   Her updated medication list for this problem includes:    Ciprofloxacin Hcl 500 Mg Tabs (Ciprofloxacin hcl) .Marland Kitchen... 1 tab by mouth twice a day for 7 days  Orders: Urinalysis-[facility] (00000) [facility]- Est Level  3 (91478)  Complete Medication List: 1)  Glyburide 5 Mg Tabs (Glyburide) .... Take two tablets daily 2)  True Track Meter Strips and Lancets  .... Check 4 times daily. disp 1 month supply 3)  Clotrimazole 1 % Crea (Clotrimazole) .... Apply topical two times a day.  disp qs x1 month. 4)  Lac-hydrin 12 % Lotn (Ammonium lactate) .... Apply to feet 4 times daily.  disp qs x1 month. 5)  Metformin Hcl 1000 Mg Tabs (Metformin hcl) .... Take one two times a day 6)  Lisinopril 5 Mg Tabs (Lisinopril) .... Take one daily 7)  Colace 100 Mg Caps (Docusate sodium) .... Take  one two times a day 8)  Promethazine Hcl 12.5 Mg Tabs (Promethazine hcl) .... Take 1 every 6 hours as needed for nausea 9)  Ciprofloxacin Hcl 500 Mg Tabs (Ciprofloxacin hcl) .Marland Kitchen.. 1 tab by mouth twice a day for 7 days 10)  Fluconazole 150 Mg Tabs (Fluconazole) .... One tab by mouth x 1 day for yeast infection - may repeat x 1 dose if not improving Prescriptions: FLUCONAZOLE 150 MG TABS (FLUCONAZOLE) one tab by mouth x 1 day for yeast infection - may repeat x 1 dose if not improving  #1 x 1   Entered and Authorized by:   Bobby Rumpf  MD   Signed by:   Bobby Rumpf  MD on 12/17/2009   Method used:   Print then Give to Patient   RxID:    [id number] CIPROFLOXACIN HCL 500 MG TABS (CIPROFLOXACIN HCL) 1 tab by mouth twice a day for 7 days  #14 x 0   Entered and Authorized by:   Bobby Rumpf  MD   Signed by:   Bobby Rumpf  MD on 12/17/2009   Method used:   Print then Give to Patient   RxID:   [id number]       Laboratory Results   Urine Tests  Date/Time Received: December 17, 2009 2:13 PM  Date/Time Reported: December 17, 2009 2:39 PM   Routine Urinalysis   Color: yellow Appearance: Clear Glucose: negative   (Normal Range: Negative) Bilirubin: negative   (Normal Range: Negative) Ketone: negative   (Normal Range: Negative) Spec. Gravity: >=1.030   (Normal Range: 1.003-1.035) Blood: small   (Normal Range: Negative) pH: 5.5   (Normal Range: 5.0-8.0) Protein: negative   (Normal Range: Negative) Urobilinogen: 0.2   (Normal Range: 0-1) Nitrite: negative   (Normal Range: Negative) Leukocyte Esterace: negative   (Normal Range: Negative)  Urine Microscopic WBC/HPF: 0-3 RBC/HPF: 0-3 Bacteria/HPF: trace Mucous/HPF: 1+ Epithelial/HPF: 5-10 Yeast/HPF: mod    Comments: ...............test performed by......Marland KitchenBonnie A. [place], MLS (ASCP)cm   Blood Tests   Date/Time Received:  Date/Time Reported:

## 2010-04-14 NOTE — Assessment & Plan Note (Addendum)
Summary: hospital f/u/mj   Vital Signs:  Patient profile:   35 year old female Weight:      216 pounds BMI:     38.40 Temp:     98.8 degrees F oral BP sitting:   123 / 82  (left arm) Cuff size:   regular  Vitals Entered By: Jimmy Footman, CMA (September 15, 2009 2:29 PM) CC: Patient is here for sugar rechack and er f/u. Head & stomach pain chronic Is Patient Diabetic? Yes Did you bring your meter with you today? Yes Pain Assessment Patient in pain? yes     Location: head&stomach Intensity: 9 Type: chronic   Primary Care Provider:  Ellery Plunk MD  CC:  Patient is here for sugar rechack and er f/u. Head & stomach pain chronic.  History of Present Illness: pt went to UC after being seen here for strep throat.  got CTX shot.  has been throwing up and having diarrhea and CBGs have been high.  she noted that one CBG was >400 so she went back to UC and they gave her insulin.  no blood in diarrhea, no fevers since seen for strep.  no sick contacts.  not having diarrhea now but thinks taht she does when she eats.  CBG this am was 120.    Habits & Providers  Alcohol-Tobacco-Diet     Tobacco Status: never  Current Medications (verified): 1)  Glyburide 5 Mg Tabs (Glyburide) .... Take Two Tablets Daily 2)  True Track Meter Strips and Lancets .... Check 4 Times Daily. Disp 1 Month Supply 3)  Clotrimazole 1 % Crea (Clotrimazole) .... Apply Topical Two Times A Day.  Disp Qs X1 Month. 4)  Lac-Hydrin 12 % Lotn (Ammonium Lactate) .... Apply To Feet 4 Times Daily.  Disp Qs X1 Month. 5)  Metformin Hcl 1000 Mg Tabs (Metformin Hcl) .... Take One Two Times A Day 6)  Lisinopril 5 Mg Tabs (Lisinopril) .... Take One Daily 7)  Colace 100 Mg Caps (Docusate Sodium) .... Take One Two Times A Day 8)  Penicillin V Potassium 500 Mg Tabs (Penicillin V Potassium) .... One Tab By Mouth Three Times A Day X 10 Days 9)  Promethazine Hcl 12.5 Mg Tabs (Promethazine Hcl) .... Take 1 Every 6 Hours As Needed For  Nausea  Allergies (verified): No Known Drug Allergies  Review of Systems       The patient complains of abdominal pain.  The patient denies fever and weight loss.    Physical Exam  General:  Well-developed,well-nourished,in no acute distress; alert,appropriate and cooperative throughout examination Lungs:  Normal respiratory effort, chest expands symmetrically. Lungs are clear to auscultation, no crackles or wheezes. Heart:  Normal rate and regular rhythm. S1 and S2 normal without gallop, murmur, click, rub or other extra sounds. Abdomen:  Bowel sounds positive,abdomen soft and non-tender without masses, organomegaly or hernias noted.   Impression & Recommendations:  Problem # 1:  GASTROENTERITIS WITHOUT DEHYDRATION (ICD-558.9) Assessment New think that high CBGs likely stress response from illness.  gave phenergan for nausea.  check BMET to insure that there is not an electrolyte abnormality. Orders: Basic Met-[facility] ([address]) [facility]- Est Level  3 (09811)  Complete Medication List: 1)  Glyburide 5 Mg Tabs (Glyburide) .... Take two tablets daily 2)  True Track Meter Strips and Lancets  .... Check 4 times daily. disp 1 month supply 3)  Clotrimazole 1 % Crea (Clotrimazole) .... Apply topical two times a day.  disp qs x1 month. 4)  Lac-hydrin 12 % Lotn (Ammonium lactate) .... Apply to feet 4 times daily.  disp qs x1 month. 5)  Metformin Hcl 1000 Mg Tabs (Metformin hcl) .... Take one two times a day 6)  Lisinopril 5 Mg Tabs (Lisinopril) .... Take one daily 7)  Colace 100 Mg Caps (Docusate sodium) .... Take one two times a day 8)  Penicillin V Potassium 500 Mg Tabs (Penicillin v potassium) .... One tab by mouth three times a day x 10 days 9)  Promethazine Hcl 12.5 Mg Tabs (Promethazine hcl) .... Take 1 every 6 hours as needed for nausea  Patient Instructions: 1)  Lamento que no se siente bien. Por favor, vuelva a verme en una semana si no son mejores. 2)   Si le da otra fiebre, sangre  en las heces o el azcar en sangre es superior a los 300 venga a verme. 3)   su receta para fenergan est en el departamento de salud Prescriptions: PROMETHAZINE HCL 12.5 MG TABS (PROMETHAZINE HCL) take 1 every 6 hours as needed for nausea  #30 x 1   Entered and Authorized by:   Ellery Plunk MD   Signed by:   Ellery Plunk MD on 09/15/2009   Method used:   Faxed to ...       [facility] (retail)       [address] [place], [place]  [address]       Ph: [phone]       Fax: [phone]   RxID:   [id number]

## 2010-04-14 NOTE — Assessment & Plan Note (Addendum)
Summary: dm/eo   Vital Signs:  Patient profile:   35 year old female Height:      63 inches Weight:      220.5 pounds BMI:     39.20 Temp:     98.7 degrees F oral Pulse rate:   76 / minute BP sitting:   123 / 75  (left arm) Cuff size:   large  Vitals Entered By: Gladstone Pih (August 24, 2009 10:20 AM) CC: F/U DM Is Patient Diabetic? Yes Did you bring your meter with you today? No Pain Assessment Patient in pain? no        Diabetic Foot Exam Foot Inspection Is there a history of a foot ulcer?              No Is there a foot ulcer now?              No Can the patient see the bottom of their feet?          Yes Are the shoes appropriate in style and fit?          No Is there swelling or an abnormal foot shape?          No Are the toenails long?                No Are the toenails thick?                Yes Are the toenails ingrown?              No Is there heavy callous build-up?              No Is there pain in the calf muscle (Intermittent claudication) when walking?    NoIs there a claw toe deformity?              No Is there elevated skin temperature?            No Is there limited ankle dorsiflexion?            No Is there foot or ankle muscle weakness?            No  Diabetic Foot Care Education Patient educated on appropriate care of diabetic feet.   High Risk Feet? Yes Set Next Diabetic Foot Exam here: 12/02/2009   10-g (5.07) Semmes-Weinstein Monofilament Test Performed by: Ellery Plunk MD          Right Foot          Left Foot Visual Inspection     normal         normal Test Control      normal         normal Site 1         normal         normal Site 2         normal         normal Site 3         normal         normal Site 4         normal         normal Site 5         normal         normal Site 6         normal         normal Site 7         normal  normal Site 8         normal         normal Site 9         abnormal         abnormal Site 10          normal         normal  Impression      abnormal         abnormal   Primary Care Provider:  Ellery Plunk MD  CC:  F/U DM.  History of Present Illness: DM- did not bring meter today.  Seeing Dr. Gerilyn Pilgrim for help with nutrition.  CBG in AM around 100.  taking glyburide and metformin and lisinopril  irregular period- hx of BTL after last preg. normally regular but this month had one period for 5 days (usually 8) then had lighter bleeding for 5 dyas 2 weeks later.  has some pain in hip flexors, but likely associated with increased walking, is concerned that hasn't had a june period yhet.  desires pregnancy test.  HA/dizziness- for last two weeks has had occasional HA and dizziness.  CBG are not low when she has checked during symptoms.  urine is mod to dark in color  Habits & Providers  Alcohol-Tobacco-Diet     Tobacco Status: never  Current Medications (verified): 1)  Glyburide 5 Mg Tabs (Glyburide) .... Take Two Tablets Daily 2)  True Track Meter Strips and Lancets .... Check 4 Times Daily. Disp 1 Month Supply 3)  Clotrimazole 1 % Crea (Clotrimazole) .... Apply Topical Two Times A Day.  Disp Qs X1 Month. 4)  Lac-Hydrin 12 % Lotn (Ammonium Lactate) .... Apply To Feet 4 Times Daily.  Disp Qs X1 Month. 5)  Metformin Hcl 1000 Mg Tabs (Metformin Hcl) .... Take One Two Times A Day 6)  Lisinopril 5 Mg Tabs (Lisinopril) .... Take One Daily 7)  Colace 100 Mg Caps (Docusate Sodium) .... Take One Two Times A Day  Allergies (verified): No Known Drug Allergies  Review of Systems       The patient complains of headaches.  The patient denies anorexia, fever, and syncope.    Physical Exam  General:  Well-developed,obese,in no acute distress; alert,appropriate and cooperative throughout examination Head:  Normocephalic and atraumatic without obvious abnormalities. No apparent alopecia or balding. Mouth:  teeth missing.   Lungs:  Normal respiratory effort, chest expands symmetrically. Lungs  are clear to auscultation, no crackles or wheezes. Heart:  Normal rate and regular rhythm. S1 and S2 normal without gallop, murmur, click, rub or other extra sounds.  Diabetes Management Exam:    Foot Exam (with socks and/or shoes not present):       Sensory-Monofilament:          Left foot: abnormal          Right foot: abnormal   Impression & Recommendations:  Problem # 1:  DIABETES-TYPE 2 (ICD-250.00) Assessment Improved A1C greatly improved from 8.8.  continue current regimen. Her updated medication list for this problem includes:    Glyburide 5 Mg Tabs (Glyburide) .Marland Kitchen... Take two tablets daily    Metformin Hcl 1000 Mg Tabs (Metformin hcl) .Marland Kitchen... Take one two times a day    Lisinopril 5 Mg Tabs (Lisinopril) .Marland Kitchen... Take one daily  Orders: A1C-[facility] (54098) [facility]- Est  Level 4 (11914)  Problem # 2:  IRREGULAR MENSES (ICD-626.4) Assessment: New ? stress with recent death of mother.  u preg neg.  will monitor.  Orders: [facility]- Est  Level 4 ([address])  Problem # 3:  HEADACHE (ICD-784.0) Assessment: New with dizziness.  thought that they might be related to low CBGs, asked her to check blood sugar with symptoms.  could consider decrease in glyburide.  also could be dehydration.  advised to drink plenty of fluids. Orders: [facility]- Est  Level 4 (60454)  Complete Medication List: 1)  Glyburide 5 Mg Tabs (Glyburide) .... Take two tablets daily 2)  True Track Meter Strips and Lancets  .... Check 4 times daily. disp 1 month supply 3)  Clotrimazole 1 % Crea (Clotrimazole) .... Apply topical two times a day.  disp qs x1 month. 4)  Lac-hydrin 12 % Lotn (Ammonium lactate) .... Apply to feet 4 times daily.  disp qs x1 month. 5)  Metformin Hcl 1000 Mg Tabs (Metformin hcl) .... Take one two times a day 6)  Lisinopril 5 Mg Tabs (Lisinopril) .... Take one daily 7)  Colace 100 Mg Caps (Docusate sodium) .... Take one two times a day  Other Orders: U Preg-[facility] (09811)  Patient Instructions: 1)  please  come back in 3 months to be checked 2)  If you have worse headaches or dizziness, please call the clinic 3)  Keep up the good work.  You have lost weight and your blood sugars look great! 4)  por favor regrese en 3 meses para ser revisado 5)   Si usted tiene peores [place] de cabeza o [place], por favor llame a la clnica 6)   Mantener el buen [place]. Ha perdido peso y Insurance claims handler en la sangre estan maravilloso! Prescriptions: LISINOPRIL 5 MG TABS (LISINOPRIL) take one daily  #30 x 4   Entered and Authorized by:   Ellery Plunk MD   Signed by:   Ellery Plunk MD on 08/24/2009   Method used:   Faxed to ...       [facility] Department (retail)       [address] [place], [place]  [address]       Ph: [phone]       Fax: [phone]   RxID:   [id number] METFORMIN HCL 1000 MG TABS (METFORMIN HCL) take one two times a day  #60 x 4   Entered and Authorized by:   Ellery Plunk MD   Signed by:   Ellery Plunk MD on 08/24/2009   Method used:   Faxed to ...       [facility] Department (retail)       [address] [place], [place]  [address]       Ph: [phone]       Fax: [phone]   RxID:   [id number] GLYBURIDE 5 MG TABS (GLYBURIDE) take two tablets daily  #60 x 4   Entered and Authorized by:   Ellery Plunk MD   Signed by:   Ellery Plunk MD on 08/24/2009   Method used:   Faxed to ...       [facility] Department (retail)       [address] [place], [place]  [address]       Ph: [phone]       Fax: [phone]   RxID:   [id number]    Laboratory Results   Urine Tests  Date/Time Received: August 24, 2009 11:30 AM  Date/Time Reported: August 24, 2009 11:35 AM     Urine  HCG: negative Comments: ...........test performed by...........Marland KitchenTerese Door, CMA   Blood Tests   Date/Time Received: August 24, 2009 10:29 AM  Date/Time Reported: August 24, 2009 11:01 AM   HGBA1C: 6.4%   (Normal Range:  Non-Diabetic - 3-6%   Control Diabetic - 6-8%)  Comments: ...........test performed by...........Marland KitchenTerese Door, CMA      Prevention & Chronic Care Immunizations   Influenza vaccine: Not documented    Tetanus booster: 09/18/2008: Tdap    Pneumococcal vaccine: Pneumovax  (09/18/2008)  Other Screening   Pap smear: NEGATIVE FOR INTRAEPITHELIAL LESIONS OR MALIGNANCY.  (12/16/2008)   Pap smear due: 12/17/2010   Smoking status: never  (08/24/2009)  Diabetes Mellitus   HgbA1C: 6.4  (08/24/2009)   Hemoglobin A1C due: 11/24/2009    Eye exam: Not documented   Diabetic eye exam action/deferral: Ophthalmology referral  (12/04/2008)    Foot exam: yes  (08/24/2009)   Foot exam action/deferral: Do today   High risk foot: Yes  (08/24/2009)   Foot care education: Done  (08/24/2009)   Foot exam due: 12/02/2009    Urine microalbumin/creatinine ratio: Not documented  Self-Management Support :   Personal Goals (by the next clinic visit) :     Personal A1C goal: 7  (05/18/2009)   Diabetes self-management support: Written self-care plan  (05/18/2009)    Diabetes self-management support not done because: Good outcomes  (02/18/2009)   Nursing Instructions: Diabetic foot exam today

## 2010-04-14 NOTE — Assessment & Plan Note (Addendum)
Summary: N&v,HA/Valentine/dspiegel   Vital Signs:  Patient profile:   35 year old female Height:      63 inches Weight:      218 pounds BMI:     38.76 BSA:     2.01 Temp:     98.6 degrees F Pulse rate:   77 / minute BP sitting:   128 / 90  Vitals Entered By: Jone Baseman CMA (September 08, 2009 3:44 PM) CC: N & V and HA Is Patient Diabetic? No Pain Assessment Patient in pain? yes     Location: chest and back Intensity: 8   Primary Care Provider:  Ellery Plunk MD  CC:  N & V and HA.  History of Present Illness: 1) Sore throat: x 2 days. Reports headache, lightheadedness, chills today. Reports nausea, emesis x 4 times today. Denies fever, cough, myalgia, runny nose, dyspnea, chest pain, rash, sick contact, syncope.   Habits & Providers  Alcohol-Tobacco-Diet     Tobacco Status: never  Current Medications (verified): 1)  Glyburide 5 Mg Tabs (Glyburide) .... Take Two Tablets Daily 2)  True Track Meter Strips and Lancets .... Check 4 Times Daily. Disp 1 Month Supply 3)  Clotrimazole 1 % Crea (Clotrimazole) .... Apply Topical Two Times A Day.  Disp Qs X1 Month. 4)  Lac-Hydrin 12 % Lotn (Ammonium Lactate) .... Apply To Feet 4 Times Daily.  Disp Qs X1 Month. 5)  Metformin Hcl 1000 Mg Tabs (Metformin Hcl) .... Take One Two Times A Day 6)  Lisinopril 5 Mg Tabs (Lisinopril) .... Take One Daily 7)  Colace 100 Mg Caps (Docusate Sodium) .... Take One Two Times A Day 8)  Penicillin V Potassium 500 Mg Tabs (Penicillin V Potassium) .... One Tab By Mouth Three Times A Day X 10 Days  Allergies (verified): No Known Drug Allergies  Review of Systems       as per HPI o/w negative   Physical Exam  General:  obese, well-hydrated, NAD  Head:  no sinus tenderness with palpation  Eyes:  no conjunctivitis  Nose:  no rhinorrhea or congestion  Mouth:  bilateral tonsillar hypertrophy w/o exudate; moist membranes  Neck:  mild lymphadenopathy anterior chain  Lungs:  CTAB  Abdomen:  soft, obese,  non-tender, no rebound or guarding  Neurologic:  alert & oriented X3.   Skin:  no rash    Impression & Recommendations:  Problem # 1:  SORE THROAT (ICD-462) Assessment New +strep. History and exam consistent with strep pharyngitis. Will treat as below. Advised regarding symptomatic treatment as well. Follow up as needed.    Her updated medication list for this problem includes:    Penicillin V Potassium 500 Mg Tabs (Penicillin v potassium) ..... One tab by mouth three times a day x 10 days  Orders: Rapid Strep-[facility] (04540) [facility]- Est Level  3 (98119)  Complete Medication List: 1)  Glyburide 5 Mg Tabs (Glyburide) .... Take two tablets daily 2)  True Track Meter Strips and Lancets  .... Check 4 times daily. disp 1 month supply 3)  Clotrimazole 1 % Crea (Clotrimazole) .... Apply topical two times a day.  disp qs x1 month. 4)  Lac-hydrin 12 % Lotn (Ammonium lactate) .... Apply to feet 4 times daily.  disp qs x1 month. 5)  Metformin Hcl 1000 Mg Tabs (Metformin hcl) .... Take one two times a day 6)  Lisinopril 5 Mg Tabs (Lisinopril) .... Take one daily 7)  Colace 100 Mg Caps (Docusate sodium) .... Take one two  times a day 8)  Penicillin V Potassium 500 Mg Tabs (Penicillin v potassium) .... One tab by mouth three times a day x 10 days  Patient Instructions: 1)  Usted puede tomar Chloraseptic spray para el dolor de garganta 2)   Usted puede tomar ibuprofeno para Chief Technology Officer o la fiebre 3)   Tomar el antibitico como se indica. 4)   Beba mucha agua 5)   Volver si no hay mejora en 4 o 5 das Prescriptions: PENICILLIN V POTASSIUM 500 MG TABS (PENICILLIN V POTASSIUM) one tab by mouth three times a day x 10 days  #30 x 0   Entered and Authorized by:   Bobby Rumpf  MD   Signed by:   Bobby Rumpf  MD on 09/08/2009   Method used:   Print then Give to Patient   RxID:   [id number]   Laboratory Results  Date/Time Received: September 08, 2009 3:55 PM  Date/Time Reported: September 08, 2009 3:55  PM   Other Tests  Rapid Strep: positive Comments: ..................test performed by..............Marland KitchenTessie Fass CMA

## 2010-04-14 NOTE — Miscellaneous (Addendum)
Summary: walk in  Clinical Lists Changes walked in with her own interpretor. c/o HA x 3 days. took tylenol. also felt nausea last night & vomited this am. has a sharp pain in her back on l side. placed in 3pm work in. aware there may be a wait of more than an hour. declined UC. interpretor states she will come back when pt is taken in. pt will call her.[place] RN  September 08, 2009 1:58 PM

## 2010-04-14 NOTE — Assessment & Plan Note (Addendum)
Summary: f/u DM   Vital Signs:  Patient profile:   35 year old female Height:      63 inches Weight:      225.7 pounds BMI:     40.13 Temp:     97.8 degrees F oral Pulse rate:   81 / minute BP sitting:   125 / 80  (left arm) Cuff size:   regular  Vitals Entered By: Garen Grams LPN (May 18, 1608 3:53 PM) CC: 0.f/u dm Is Patient Diabetic? Yes Did you bring your meter with you today? Yes Pain Assessment Patient in pain? no        Primary Care Provider:  Ellery Plunk MD  CC:  0.f/u dm.  History of Present Illness: Pt's mother recently died.  She has been eating a lot and her CBGs have been up.  She is going to a group at hospice in [place] for grief counseling.  pt only taking 5mg  glyburide.  not sure about carbs/proper nutrition.  eating one big meal per day.  no breakfast.  pt stopped breast feeding.  Habits & Providers  Alcohol-Tobacco-Diet     Tobacco Status: never  Current Medications (verified): 1)  Glyburide 5 Mg Tabs (Glyburide) .... Take Two Tablets Daily 2)  True Track Meter Strips .... Check 4 Times Daily. Disp 1 Month Supply 3)  Clotrimazole 1 % Crea (Clotrimazole) .... Apply Topical Two Times A Day.  Disp Qs X1 Month. 4)  Lac-Hydrin 12 % Lotn (Ammonium Lactate) .... Apply To Feet 4 Times Daily.  Disp Qs X1 Month. 5)  Metformin Hcl 500 Mg Tabs (Metformin Hcl) .... Take One Tablet Daily For One Week, Then 1 Tablets Twice Daily 6)  Lisinopril 5 Mg Tabs (Lisinopril) .... Take One Daily  Allergies (verified): No Known Drug Allergies  Past History:  Past Medical History: DM - Had GDM with first pregnancy and then later diagnosed with DM. Was seen at [facility] for her diabetes 2 years ago.  Now with microalbuminuria.  Family History: Dad - DM Mom - HTN, died 28-Apr-2009 Sister - hysterectomy because of a cyst No cancer  Review of Systems       The patient complains of weight loss.  The patient denies anorexia, chest pain, syncope, and dyspnea on  exertion.    Physical Exam  General:  good hygiene and overweight-appearing.   Head:  normocephalic and atraumatic.   Lungs:  Normal respiratory effort, chest expands symmetrically. Lungs are clear to auscultation, no crackles or wheezes. Heart:  Normal rate and regular rhythm. S1 and S2 normal without gallop, murmur, click, rub or other extra sounds. Abdomen:  Bowel sounds positive,abdomen soft and non-tender without masses, organomegaly or hernias noted. Extremities:  No clubbing, cyanosis, edema, or deformity noted with normal full range of motion of all joints.     Impression & Recommendations:  Problem # 1:  DIABETES-TYPE 2 (ICD-250.00) Assessment Deteriorated A!C of 8.8 now.  pt no longer breastfeeding.  will start lisinopril for microalbuminuria, will add metformin and increase glyburide to 10mg /day.  see back in 1 month for CBG log check.  at that time will do foot exam.  also, recommended pt to see Dr. Gerilyn Pilgrim for nutrition help.    Her updated medication list for this problem includes:    Glyburide 5 Mg Tabs (Glyburide) .Marland Kitchen... Take two tablets daily    Metformin Hcl 500 Mg Tabs (Metformin hcl) .Marland Kitchen... Take one tablet daily for one week, then 1 tablets twice daily  Lisinopril 5 Mg Tabs (Lisinopril) .Marland Kitchen... Take one daily  Orders: A1C-[facility] ([address]) [facility]- Est Level  3 (60454)  Complete Medication List: 1)  Glyburide 5 Mg Tabs (Glyburide) .... Take two tablets daily 2)  True Track Meter Strips  .... Check 4 times daily. disp 1 month supply 3)  Clotrimazole 1 % Crea (Clotrimazole) .... Apply topical two times a day.  disp qs x1 month. 4)  Lac-hydrin 12 % Lotn (Ammonium lactate) .... Apply to feet 4 times daily.  disp qs x1 month. 5)  Metformin Hcl 500 Mg Tabs (Metformin hcl) .... Take one tablet daily for one week, then 1 tablets twice daily 6)  Lisinopril 5 Mg Tabs (Lisinopril) .... Take one daily  Patient Instructions: 1)  It was nice to see you today. 2)  come back in one month  and bring your blood sugar log. 3)  Make an appt with Dr. Gerilyn Pilgrim for nutrition. 4)  I have added a medicine called metformin.  take one pill daily for one week, then increase to one pill two times a day. 5)  I have increased your glyburide to two times a day 6)  Fue un placer verte hoy. 7)   vuelve en un mes y traer Manufacturing engineer de azcar en la sangre. 8)   Haga una cita con el Dr. Gerilyn Pilgrim para la nutricin. 9)   He aadido un medicamento llamado metformina. tome una pastilla al da para Elisabeth Most, luego aumentar a Insurance risk surveyor. 10)   He aumentado su gliburida o [employer] al da Prescriptions: TRUE TRACK METER STRIPS Check 4 times daily. Disp 1 month supply  #1 x 12   Entered and Authorized by:   Ellery Plunk MD   Signed by:   Ellery Plunk MD on 05/18/2009   Method used:   Faxed to ...       [facility] (retail)             [place], [place]         Ph:        Fax: [phone]   RxID:   [id number] LISINOPRIL 5 MG TABS (LISINOPRIL) take one daily  #30 x 3   Entered and Authorized by:   Ellery Plunk MD   Signed by:   Ellery Plunk MD on 05/18/2009   Method used:   Electronically to        [facility] (retail)       [address]       [place], [place]  [address]       Ph: [phone]       Fax: [phone]   RxID:   [id number] METFORMIN HCL 500 MG TABS (METFORMIN HCL) take one tablet daily for one week, then 1 tablets twice daily  #60 x 1   Entered and Authorized by:   Ellery Plunk MD   Signed by:   Ellery Plunk MD on 05/18/2009   Method used:   Electronically to        [facility] (retail)       [address]       [place], [place]  [address]       Ph: [phone]       Fax: [phone]   RxID:   [id number] GLYBURIDE 5 MG TABS (GLYBURIDE) take two tablets daily  #60 x  3   Entered and Authorized by:   Ellery Plunk MD   Signed by:   Ellery Plunk MD on  05/18/2009   Method used:   Electronically to        [facility] (retail)       [address]       [place], [place]  [address]       Ph: [phone]       Fax: [phone]   RxID:   [id number]   Laboratory Results   Blood Tests   Date/Time Received: May 18, 2009 3:55 PM  Date/Time Reported: May 18, 2009 4:13 PM   HGBA1C: 8.8%   (Normal Range: Non-Diabetic - 3-6%   Control Diabetic - 6-8%)  Comments: ...............test performed by......Marland KitchenBonnie A. [place], MLS (ASCP)cm      Prevention & Chronic Care Immunizations   Influenza vaccine: Not documented    Tetanus booster: 09/18/2008: Tdap    Pneumococcal vaccine: Pneumovax  (09/18/2008)  Other Screening   Pap smear: NEGATIVE FOR INTRAEPITHELIAL LESIONS OR MALIGNANCY.  (12/16/2008)   Pap smear due: 08/23/2009   Smoking status: never  (05/18/2009)  Diabetes Mellitus   HgbA1C: 8.8  (05/18/2009)   Hemoglobin A1C due: 08/18/2009    Eye exam: Not documented   Diabetic eye exam action/deferral: Ophthalmology referral  (12/04/2008)    Foot exam: yes  (12/04/2008)   Foot exam action/deferral: Deferred   High risk foot: Not documented   Foot care education: Not documented   Foot exam due: 08/18/2009    Urine microalbumin/creatinine ratio: Not documented    Diabetes flowsheet reviewed?: Yes   Progress toward A1C goal: Deteriorated    Stage of readiness to change (diabetes management): Preparation  Self-Management Support :   Personal Goals (by the next clinic visit) :     Personal A1C goal: 7  (05/18/2009)   Patient will work on the following items until the next clinic visit to reach self-care goals:     Medications and monitoring: take my medicines every day, check my blood sugar, bring all of my medications to every visit, examine my feet every day  (05/18/2009)    Diabetes self-management support: Written self-care plan  (05/18/2009)   Diabetes care plan printed     Diabetes self-management support not done because: Good outcomes  (02/18/2009)

## 2010-04-14 NOTE — Assessment & Plan Note (Addendum)
Summary: left eye redness    Vital Signs:  Patient profile:   35 year old female Weight:      225.6 pounds Temp:     98.2 degrees F Pulse rate:   100 / minute BP sitting:   120 / 80  (left arm) Cuff size:   large  Vitals Entered By: Arlyss Repress CMA, (April 28, 2009 11:19 AM) CC: woke up with redness in left eye on Sunday. denies pain, itching, injury to eye or strain. Is Patient Diabetic? Yes Pain Assessment Patient in pain? no       Vision Screening:Left eye w/o correction: 20 / 20 Right Eye w/o correction: 20 / 40 Both eyes w/o correction:  20/ 25        Vision Entered By: Arlyss Repress CMA, (April 28, 2009 11:21 AM)   Primary Care Provider:  Ellery Plunk MD  CC:  woke up with redness in left eye on Sunday. denies pain, itching, and injury to eye or strain.Marland Kitchen  History of Present Illness: 1) Left eye red: Patient with left eye redness x 3 days. Has never had this before. Not painful but slightly uncomfortable. Has full range of motion without pain. Denies trauma, drainage, sick contact, vision change, headache. Reports sthat her mother passed away last week; the funeral was this past weekend; she has been under a lot of stress.   Habits & Providers  Alcohol-Tobacco-Diet     Tobacco Status: never  Current Medications (verified): 1)  Glyburide 5 Mg  Tabs (Glyburide) .... Take One Tablet Bid. Patient's Name Is Incorrect At Your Store.  At [facility] Name Is Bonito-Rosete. Please Correct 2)  Glucometer, Strips, and Lancets .... Please Provide Cheapest Glucometer Along With Strips and Lancets. Check Daily. Disp 1 Month Supply 3)  Clotrimazole 1 % Crea (Clotrimazole) .... Apply Topical Two Times A Day.  Disp Qs X1 Month. 4)  Lac-Hydrin 12 % Lotn (Ammonium Lactate) .... Apply To Feet 4 Times Daily.  Disp Qs X1 Month.  Allergies (verified): No Known Drug Allergies  Physical Exam  General:  obese, well appearing, NAD  Head:  non tender  Eyes:  vision intact  grossly 20/25 both eyes 20/20 L  20/40 R PERRL, EOMI w/o pain, no drainage, normal tearing.  small inflamed pinguecula left eye nasal portion - does not extend to margin of iris RIGHT retina w/ area of pigmentation just lateral to optic disk o/w fundi normal  Nose:  no congestion or rhinorrhea  Mouth:  moist membranes, no erythema or exudate  Neck:  no lymphadenopathy   Lungs:  CTAB w/o wheeze, crackles or airway congestion    Impression & Recommendations:  Problem # 1:  PINGUECULA (ICD-372.51) Assessment New  Left eye pingueculitis. Reasured patient. Symptomatic treatment with lubricating eye drops. Abx drops not indicated. Follow up as needed with PCP.   ****Patient also awaiting Diabetic Eye Exam - referral was made but awaiting physician volunteers   Orders: [facility]- Est Level  3 (40102)  Complete Medication List: 1)  Glyburide 5 Mg Tabs (Glyburide) .... Take one tablet bid. patient's name is incorrect at your store.  at [facility] name is bonito-rosete. please correct 2)  Glucometer, Strips, and Lancets  .... Please provide cheapest glucometer along with strips and lancets. check daily. disp 1 month supply 3)  Clotrimazole 1 % Crea (Clotrimazole) .... Apply topical two times a day.  disp qs x1 month. 4)  Lac-hydrin 12 % Lotn (Ammonium lactate) .... Apply to feet 4 times daily.  disp qs x1 month.

## 2010-04-14 NOTE — Assessment & Plan Note (Addendum)
Summary: f/u DM   Vital Signs:  Patient profile:   35 year old female Height:      63 inches Weight:      229.4 pounds BMI:     40.78 Temp:     97.6 degrees F oral Pulse rate:   111 / minute BP sitting:   118 / 80  (right arm) Cuff size:   large  Vitals Entered By: Garen Grams LPN (April 06, 2009 8:54 AM) CC: f/u dm Is Patient Diabetic? Yes Did you bring your meter with you today? No Pain Assessment Patient in pain? no        Primary Care Provider:  Ellery Plunk MD  CC:  f/u dm.  History of Present Illness: DM- presents for f/u of DM.  pt lost meter at friends house 3 weeks ago.  she was taking her CBGs 3x/day at that time.  THey were higher than normal because she is stressed about her mother who is sick.  She has been tired and she is eating more, especially fruit.  She is walking 10 min/day.    feet-  was unable to get presciption cream bc was too expensive.  she is now on another cream for feet that had cracked feet in the picture on the tube.  she does not know if this is antifungal.  she will bring this to next appt.  She is still breast feeding but is weening.  she will let me know when she is done.  Habits & Providers  Alcohol-Tobacco-Diet     Tobacco Status: never  Current Medications (verified): 1)  Glyburide 5 Mg  Tabs (Glyburide) .... Take One Tablet Bid. Patient's Name Is Incorrect At Your Store.  At [facility] Name Is Bonito-Rosete. Please Correct 2)  Glucometer, Strips, and Lancets .... Please Provide Cheapest Glucometer Along With Strips and Lancets. Check Daily. Disp 1 Month Supply 3)  Clotrimazole 1 % Crea (Clotrimazole) .... Apply Topical Two Times A Day.  Disp Qs X1 Month. 4)  Lac-Hydrin 12 % Lotn (Ammonium Lactate) .... Apply To Feet 4 Times Daily.  Disp Qs X1 Month.  Allergies (verified): No Known Drug Allergies  Review of Systems       The patient complains of headaches.  The patient denies anorexia, fever, weight loss, and abdominal pain.          blurry vision  Physical Exam  General:  alert, appropriate dress, cooperative to examination, and overweight-appearing.   Head:  normocephalic and atraumatic.   Eyes:  pupils equal, pupils round, and pupils reactive to light.   Lungs:  Normal respiratory effort, chest expands symmetrically. Lungs are clear to auscultation, no crackles or wheezes. Heart:  Normal rate and regular rhythm. S1 and S2 normal without gallop, murmur, click, rub or other extra sounds. Abdomen:  Bowel sounds positive,abdomen soft and non-tender without masses, organomegaly or hernias noted. obese Extremities:  No clubbing, cyanosis, edema, or deformity noted with normal full range of motion of all joints.     Impression & Recommendations:  Problem # 1:  DERMATOPHYTOSIS OF FOOT (ICD-110.4) Assessment Unchanged will bring the creams she is taking at next visit.  They have not helped her feet, but i am not sure if they include a antifungal.  will recommend she at least try lamisil if this cream doesn't have it.  can give an oral antifungal when she stops breastfeeding.  Her updated medication list for this problem includes:    Clotrimazole 1 % Crea (Clotrimazole) .Marland KitchenMarland KitchenMarland KitchenMarland Kitchen  Apply topical two times a day.  disp qs x1 month.  Problem # 2:  DIABETES-TYPE 2 (ICD-250.00) Assessment: Deteriorated asked pt to log CBGs and bring to next appt.  her a1c was up.  may be due to diet and stress.  discussed diet with pt, who might benefit from another session with a diabetes educator.  will consider increasing medication at next visit.  Ashby Dawes was translating and [facility] and give support for her stress.   Her updated medication list for this problem includes:    Glyburide 5 Mg Tabs (Glyburide) .Marland Kitchen... Take one tablet bid. patient's name is incorrect at your store.  at [facility] name is bonito-rosete. please correct  Orders: Ophthalmology Referral (Ophthalmology) [facility]- Est Level   3 ([address]295)  Complete Medication List: 1)  Glyburide 5 Mg Tabs (Glyburide) .... Take one tablet bid. patient's name is incorrect at your store.  at [facility] name is bonito-rosete. please correct 2)  Glucometer, Strips, and Lancets  .... Please provide cheapest glucometer along with strips and lancets. check daily. disp 1 month supply 3)  Clotrimazole 1 % Crea (Clotrimazole) .... Apply topical two times a day.  disp qs x1 month. 4)  Lac-hydrin 12 % Lotn (Ammonium lactate) .... Apply to feet 4 times daily.  disp qs x1 month.  Patient Instructions: 1)  please come back in one month 2)  bring your blood sugar log, and take your blood sugar every morning before breakfast. 3)  try to walk more, this will help with your anxiety. 4)  bring your medicines with you then. 5)  1) por favor regrese en un mes 6)   2) llevar el registro de Banker, y tome su azcar en la sangre todas las maanas antes del desayuno. 7)   3) tratar de caminar ms, esto le ayudar con su ansiedad. 8)   4) traiga sus medicamentos con usted la proxima vez. Prescriptions: GLYBURIDE 5 MG  TABS (GLYBURIDE) Take one tablet BID. patient's name is incorrect at your store.  At [facility] name is Bonito-Rosete. Please correct  #90 x 3   Entered and Authorized by:   Ellery Plunk MD   Signed by:   Ellery Plunk MD on 04/06/2009   Method used:   Print then Give to Patient   RxID:   [id number] GLUCOMETER, STRIPS, AND LANCETS Please provide cheapest glucometer along with strips and lancets. Check daily. Disp 1 month supply  #1 x 11   Entered and Authorized by:   Ellery Plunk MD   Signed by:   Ellery Plunk MD on 04/06/2009   Method used:   Print then Give to Patient   RxID:   [id number] GLYBURIDE 5 MG  TABS (GLYBURIDE) Take one tablet BID. patient's name is incorrect at your store.  At [facility] name is Bonito-Rosete. Please correct  #90 x 3   Entered and Authorized by:   Ellery Plunk MD   Signed by:   Ellery Plunk MD on 04/06/2009   Method used:   Electronically to        [facility] (retail)       [address]       [place], [place]  [address]       Ph: [phone]       Fax: [phone]   RxID:   [id number]

## 2010-04-14 NOTE — Assessment & Plan Note (Addendum)
Summary: nutrition appt pe Dr. Spiegel/bmc   Vital Signs:  Patient profile:   35 year old female Height:      63 inches Weight:      227.7 pounds BMI:     40.48  Vitals Entered By: Mary Almas PHD (June 01, 2009 10:14 AM)  Primary Care Provider:  Ellery Plunk MD   History of Present Illness: Assessment: Spent 30 minutes wiht pt.  [name] lives with her husband and 3 children.  Usual eating pattern includes lunch and dinner and variable snacks.  24-hr recall suggests kcal intake of  ~1000: B (up  ~9 AM)- none; Snk (10:30 AM)- meat, tomato, onion soup; L (2 PM)- 1 slc bread toasted, carrots & dip, water;D (8 PM)- chx w/ chili sauce, 1 c rice.  [name] said this is a pretty normal day for her, but it's difficult to reconcile this as a typical day with her BMI of >40.  Her main physical activity is when she walks her children to school, which is 5 min each way, and she does this morning and afternoon.  She said she also goes up and down the stairs in their apt building frequently.  [name] talked of seeing both her parents with DM, so she tries to limit carbohydrates herself.    Nutrition Diagnosis: Physical inactivity (NB-2.1) as evidenced by no reported exercise except 5-min walk 4 X day, 5 X wk.  Excessive energy intake (NI-1.5) related to expenditure as evidenced by BMI of >40.   Intervention: See Patient Instructions.    Monitoring/Eval:  Dietary intake, body weight, and exercise at 2-3-wk F/U WITH INTERPRETOR.     Allergies: No Known Drug Allergies   Complete Medication List: 1)  Glyburide 5 Mg Tabs (Glyburide) .... Take two tablets daily 2)  True Track Meter Strips  .... Check 4 times daily. disp 1 month supply 3)  Clotrimazole 1 % Crea (Clotrimazole) .... Apply topical two times a day.  disp qs x1 month. 4)  Lac-hydrin 12 % Lotn (Ammonium lactate) .... Apply to feet 4 times daily.  disp qs x1 month. 5)  Metformin Hcl 500 Mg Tabs (Metformin hcl)  .... Take one tablet daily for one week, then 1 tablets twice daily 6)  Lisinopril 5 Mg Tabs (Lisinopril) .... Take one daily  Other Orders: Inital Assessment Each - [facility] [id number])  Patient Instructions: 1)  Eat at least 3 meals and 1-2 snacks per day. Eat breakfast within the first hour of getting up, and go no more than 5 hours between eating. 2)  Obtain twice as many veg's as protein or carbohydrate foods for both lunch and dinner. 3)  Limit carbohydrate foods to 2 per meal.  4)  After you walk your children to school in the morning, walk another 25 minutes at least, to get at least 30 minutes of walking 5 X wk.   5)  Schedule a follow-up appointment in 2-3 weeks.

## 2010-04-14 NOTE — Assessment & Plan Note (Addendum)
Summary: f/u with dr Zaquan Duffner/bmc   Vital Signs:  Patient profile:   35 year old female Height:      63 inches Weight:      226.2 pounds BMI:     40.21  Vitals Entered By: Wyona Almas PHD (June 22, 2009 10:36 AM)  Primary Care Provider:  Ellery Plunk MD   History of Present Illness: Assessment:  Spent 60 minutes with pt.  [name] has started dancing  ~60 min 3 X wk, and she has walked a bit more than usual, including a 30-min walk to and 30-min walk home from the store, which made her tired and sore.  She has been eating breakfast  ~half the days of the week:  Coffee with 2% milk and 2 slices of bread.  For lunch and dinner, she has limited starches to 0-2 servings, and she has been eating vegetables at both meals.  She said her BG levels are much improved, ranging from 99 to 130s, with one occasionally in the 150s.  Previously most were >200.  24-hr recall suggests kcal intake of  ~900: B (8:30 AM)- coffee with 2% milk, 2 slc plain bread; L (12:30 PM)- 1 c veg's, 3/4 c beans, 1/2 c rice, water; D (6:30 PM)- 1 c veg's, 1 chx leg w/ no skin.  Usual intake includes occasional fruit snack, which she did not have yesterday.    Nutrition Diagnosis: Progress noted on physical inactivity (NB-2.1) as evidenced by no reported exercise except 5-min walk 4 X day, 5 X wk.  Significant progress on excessive energy intake (NI-1.5) related to expenditure as evidenced by 24-hr recall of only 900 kcal.   Intervention: See Patient Instructions.    Monitoring/Eval:  Dietary intake, body weight, and exercise at 4-wk F/U WITH INTERPRETOR.     Allergies: No Known Drug Allergies   Complete Medication List: 1)  Glyburide 5 Mg Tabs (Glyburide) .... Take two tablets daily 2)  True Track Meter Strips and Lancets  .... Check 4 times daily. disp 1 month supply 3)  Clotrimazole 1 % Crea (Clotrimazole) .... Apply topical two times a day.  disp qs x1 month. 4)  Lac-hydrin 12 % Lotn (Ammonium lactate)  .... Apply to feet 4 times daily.  disp qs x1 month. 5)  Metformin Hcl 1000 Mg Tabs (Metformin hcl) .... Take one two times a day 6)  Lisinopril 5 Mg Tabs (Lisinopril) .... Take one daily 7)  Colace 100 Mg Caps (Docusate sodium) .... Take one two times a day  Other Orders: Reassessment Each 15 min unit- [facility] [phone])  Patient Instructions: 1)  Tu est a haciendo muy buen [place]. 2)  Por favor a continua incluyendo vegetales in el almuevzo y la [place].  3)  Me gustavia que caminara por la menos 15 minutos, 5 dias a la semana, y continua llevando tu nious a la escuela. 4)  Continua bailando tres dias a la semana.

## 2010-04-14 NOTE — Assessment & Plan Note (Addendum)
Summary: f/u visit/bmc   Vital Signs:  Patient profile:   35 year old female Height:      63 inches Weight:      228 pounds Temp:     98.6 degrees F oral Pulse rate:   74 / minute BP sitting:   110 / 74  (left arm) Cuff size:   large  Vitals Entered By: [place], SMA CC: F/U Is Patient Diabetic? Yes Pain Assessment Patient in pain? yes     Location: back Intensity: 6   Primary Care Provider:  Ellery Plunk MD  CC:  F/U.  History of Present Illness: Pt presents with several days of bright red blood per rectum, that was from saturday to [place] and is now stopped.  This blood is on the toilet paper as well as the stool. This has happened once before with episode of constipation.  SHe denies constipation now, but has someo n and off for several years.  She does not eat very much fiber.    DM- 2 week average is 129.  went to nutritionist, was helpful. started eating breakfast.    Current Medications (verified): 1)  Glyburide 5 Mg Tabs (Glyburide) .... Take Two Tablets Daily 2)  True Track Meter Strips and Lancets .... Check 4 Times Daily. Disp 1 Month Supply 3)  Clotrimazole 1 % Crea (Clotrimazole) .... Apply Topical Two Times A Day.  Disp Qs X1 Month. 4)  Lac-Hydrin 12 % Lotn (Ammonium Lactate) .... Apply To Feet 4 Times Daily.  Disp Qs X1 Month. 5)  Metformin Hcl 1000 Mg Tabs (Metformin Hcl) .... Take One Two Times A Day 6)  Lisinopril 5 Mg Tabs (Lisinopril) .... Take One Daily 7)  Colace 100 Mg Caps (Docusate Sodium) .... Take One Two Times A Day  Allergies (verified): No Known Drug Allergies  Review of Systems  The patient denies anorexia, fever, weight loss, chest pain, dyspnea on exertion, prolonged cough, and abdominal pain.    Physical Exam  General:  Well-developed,well-nourished,in no acute distress; alert,appropriate and cooperative throughout examination Lungs:  Normal respiratory effort, chest expands symmetrically. Lungs are clear to auscultation,  no crackles or wheezes. Heart:  Normal rate and regular rhythm. S1 and S2 normal without gallop, murmur, click, rub or other extra sounds. Abdomen:  Bowel sounds positive,abdomen soft and non-tender without masses, organomegaly or hernias noted. Rectal:  stool negative for occult blood.no external abnormalities, no hemorrhoids, normal sphincter tone, no masses, no fissures, and no fistulae.  very tender   Impression & Recommendations:  Problem # 1:  RECTAL BLEEDING (ICD-569.3) Assessment New examined with anoscopy and rectal exam. Did not see a fissure or hemmorhoid for source of bleeding, but think there might be a small tear that is hard to see.  started colace, recommended fiber and lots  of water.  asked her to RTC if continues bleeding.  would consider a lidocaine gel if pain does not improve.  would consider GI consult in future if stools are soft and still continuing. Orders: Anoscopy - [facility] (46600) [facility]- Est  Level 4 ([address]109)  Problem # 2:  DIABETES-TYPE 2 (ICD-250.00) Assessment: Unchanged increased her metformin and refilled meds.  doing better.  recheck A1C in 2 months. Her updated medication list for this problem includes:    Glyburide 5 Mg Tabs (Glyburide) .Marland Kitchen... Take two tablets daily    Metformin Hcl 1000 Mg Tabs (Metformin hcl) .Marland Kitchen... Take one two times a day    Lisinopril 5 Mg Tabs (Lisinopril) .Marland KitchenMarland KitchenMarland KitchenMarland Kitchen  Take one daily  Orders: [facility]- Est  Level 4 ([address])  Complete Medication List: 1)  Glyburide 5 Mg Tabs (Glyburide) .... Take two tablets daily 2)  True Track Meter Strips and Lancets  .... Check 4 times daily. disp 1 month supply 3)  Clotrimazole 1 % Crea (Clotrimazole) .... Apply topical two times a day.  disp qs x1 month. 4)  Lac-hydrin 12 % Lotn (Ammonium lactate) .... Apply to feet 4 times daily.  disp qs x1 month. 5)  Metformin Hcl 1000 Mg Tabs (Metformin hcl) .... Take one two times a day 6)  Lisinopril 5 Mg Tabs (Lisinopril) .... Take one daily 7)  Colace 100 Mg Caps  (Docusate sodium) .... Take one two times a day  Patient Instructions: 1)  It was nice to see you today. come back in one month to check on this. 2)  your problem may be due to constipation or strain. 3)  Please take Colace and eat foods with lots of fiber to help this.  You should also drink 8 glasses of water per day.  Exercise will also help.  30 min per day of walking is great! 4)  Fue Paramedic.  5)  su problema puede ser debido al estreimiento o tensin. 6)   Por favor, tenga Colace y comer alimentos con mucha fibra para este fin. Tambin deben beber 8 vasos de agua al [employer]. El ejercicio [place]. 30 minutos por da de [address] bueno! Prescriptions: TRUE TRACK METER STRIPS AND LANCETS Check 4 times daily. Disp 1 month supply  #1 x 6   Entered and Authorized by:   Ellery Plunk MD   Signed by:   Ellery Plunk MD on 06/18/2009   Method used:   Print then Give to Patient   RxID:   [id number] METFORMIN HCL 1000 MG TABS (METFORMIN HCL) take one two times a day  #60 x 6   Entered and Authorized by:   Ellery Plunk MD   Signed by:   Ellery Plunk MD on 06/18/2009   Method used:   Electronically to        [facility] (retail)       [address]       [place], [place]  [address]       Ph: [phone]       Fax: [phone]   RxID:   [id number] COLACE 100 MG CAPS (DOCUSATE SODIUM) take one two times a day  #60 x 0   Entered and Authorized by:   Ellery Plunk MD   Signed by:   Ellery Plunk MD on 06/18/2009   Method used:   Electronically to        [facility] (retail)       [address]       [place], [place]  [address]       Ph: [phone]       Fax: [phone]   RxID:   [id number]     Prevention & Chronic Care Immunizations   Influenza vaccine: Not documented    Tetanus booster: 09/18/2008: Tdap    Pneumococcal vaccine: Pneumovax  (09/18/2008)  Other Screening    Pap smear: NEGATIVE FOR INTRAEPITHELIAL LESIONS OR MALIGNANCY.  (12/16/2008)   Pap smear due: 08/23/2009    Smoking status: never  (05/18/2009)  Diabetes Mellitus   HgbA1C: 8.8  (05/18/2009)  Hemoglobin A1C due: 08/18/2009    Eye exam: Not documented   Diabetic eye exam action/deferral: Ophthalmology referral  (12/04/2008)    Foot exam: yes  (12/04/2008)   Foot exam action/deferral: Deferred   High risk foot: Not documented   Foot care education: Not documented   Foot exam due: 08/18/2009    Urine microalbumin/creatinine ratio: Not documented    Diabetes flowsheet reviewed?: Yes   Progress toward A1C goal: Unchanged  Self-Management Support :   Personal Goals (by the next clinic visit) :     Personal A1C goal: 7  (05/18/2009)   Patient will work on the following items until the next clinic visit to reach self-care goals:     Medications and monitoring: take my medicines every day, check my blood sugar, bring all of my medications to every visit, examine my feet every day  (05/18/2009)    Diabetes self-management support: Written self-care plan  (05/18/2009)    Diabetes self-management support not done because: Good outcomes  (02/18/2009)

## 2010-04-14 NOTE — Assessment & Plan Note (Addendum)
Summary: fu diabetes/mj   Vital Signs:  Patient profile:   35 year old female Height:      63 inches Weight:      223.7 pounds BMI:     39.77 Temp:     98.5 degrees F oral Pulse rate:   80 / minute BP sitting:   118 / 78  (left arm) Cuff size:   large  Vitals Entered By: Garen Grams LPN (February 08, 2010 10:33 AM) CC: vaginal irritation, rectal bleeding Is Patient Diabetic? Yes Did you bring your meter with you today? No Pain Assessment Patient in pain? no        Primary Care Provider:  Ellery Plunk MD  CC:  vaginal irritation and rectal bleeding.  History of Present Illness: vaginal irritation- thick d/c, bad smell, itching.  using a liner every day.  skin is irritated.  some burning as well.  happened once before when she was pregnant.    rectal bleeding- happened about 6 months before.  exam at that time did not show hemmorhoid or fissure.  bleeding went away once stool softened with meds. now stool is hard again.  2 weeks of bleeding with small hard stools, hurts to have BM.  DM- under good control per pt.  taking meds as perscribed.    Habits & Providers  Alcohol-Tobacco-Diet     Tobacco Status: never     Cigarette Packs/Day: n/a  Current Medications (verified): 1)  Glyburide 5 Mg Tabs (Glyburide) .... Take Two Tablets Daily 2)  True Track Meter Strips and Lancets .... Check 4 Times Daily. Disp 1 Month Supply 3)  Clotrimazole 1 % Crea (Clotrimazole) .... Apply Topical Two Times A Day.  Disp Qs X1 Month. 4)  Lac-Hydrin 12 % Lotn (Ammonium Lactate) .... Apply To Feet 4 Times Daily.  Disp Qs X1 Month. 5)  Metformin Hcl 1000 Mg Tabs (Metformin Hcl) .... Take One Two Times A Day 6)  Lisinopril 5 Mg Tabs (Lisinopril) .... Take One Daily 7)  Colace 100 Mg Caps (Docusate Sodium) .... Take One Two Times A Day 8)  Promethazine Hcl 12.5 Mg Tabs (Promethazine Hcl) .... Take 1 Every 6 Hours As Needed For Nausea 9)  Ciprofloxacin Hcl 500 Mg Tabs (Ciprofloxacin Hcl) .Marland Kitchen.. 1  Tab By Mouth Twice A Day For 7 Days 10)  Fluconazole 150 Mg Tabs (Fluconazole) .... One Tab By Mouth X 1 Day For Yeast Infection - May Repeat X 1 Dose If Not Improving 11)  Miralax  Powd (Polyethylene Glycol 3350) .Marland Kitchen.. 1 Cap in 8 Oz of Water Q 4 Hours Until Stool Is Soft.  Allergies (verified): No Known Drug Allergies  Review of Systems  The patient denies anorexia, fever, and weight loss.    Physical Exam  General:  NAD vs reviewed.   Lungs:  Normal respiratory effort, chest expands symmetrically. Lungs are clear to auscultation, no crackles or wheezes. Heart:  Normal rate and regular rhythm. S1 and S2 normal without gallop, murmur, click, rub or other extra sounds. Abdomen:  obese, mild tenderness LLQ Rectal:  no external abnormalities and normal sphincter tone.   Genitalia:  Pelvic Exam:        External: normal female genitalia without lesions or masses        Vagina: normal without lesions or masses        Cervix: small area of whiteness on cervix at 9 o'clock        Adnexa: normal bimanual exam without masses or fullness  Uterus: normal by palpation        Pap smear: performed   Impression & Recommendations:  Problem # 1:  DIABETES-TYPE 2 (ICD-250.00) Assessment Unchanged continue current meds.  a1c today. will see back in one month to discuss CBGs as pt did not bring log. Her updated medication list for this problem includes:    Glyburide 5 Mg Tabs (Glyburide) .Marland Kitchen... Take two tablets daily    Metformin Hcl 1000 Mg Tabs (Metformin hcl) .Marland Kitchen... Take one two times a day    Lisinopril 5 Mg Tabs (Lisinopril) .Marland Kitchen... Take one daily  Problem # 2:  RECTAL BLEEDING (ICD-569.3) Assessment: Deteriorated recurrent.  last time resolved with meds for constipation.  likely a rectal fissure caused by constipation.  pt refused internal exam.  if not relieved with miralax and colace, will reevaluate. Orders: [facility]- Est  Level 4 (86578)  Complete Medication List: 1)  Glyburide 5 Mg Tabs  (Glyburide) .... Take two tablets daily 2)  True Track Meter Strips and Lancets  .... Check 4 times daily. disp 1 month supply 3)  Clotrimazole 1 % Crea (Clotrimazole) .... Apply topical two times a day.  disp qs x1 month. 4)  Lac-hydrin 12 % Lotn (Ammonium lactate) .... Apply to feet 4 times daily.  disp qs x1 month. 5)  Metformin Hcl 1000 Mg Tabs (Metformin hcl) .... Take one two times a day 6)  Lisinopril 5 Mg Tabs (Lisinopril) .... Take one daily 7)  Colace 100 Mg Caps (Docusate sodium) .... Take one two times a day 8)  Promethazine Hcl 12.5 Mg Tabs (Promethazine hcl) .... Take 1 every 6 hours as needed for nausea 9)  Ciprofloxacin Hcl 500 Mg Tabs (Ciprofloxacin hcl) .Marland Kitchen.. 1 tab by mouth twice a day for 7 days 10)  Fluconazole 150 Mg Tabs (Fluconazole) .... One tab by mouth x 1 day for yeast infection - may repeat x 1 dose if not improving 11)  Miralax Powd (Polyethylene glycol 3350) .Marland Kitchen.. 1 cap in 8 oz of water q 4 hours until stool is soft.  [facility]: [phone][facility] Pap Smear-[facility] ([address]) Dental Referral (Dentist)  Patient Instructions: 1)  Envi a la medicacin para la constipacin en el departamento de salud. Por favor tome el Miralax hasta que su materia fecal es blanda y tomar el Colace todos [place]. 2)   Para la irritacin vaginal, se trata de una infeccin por levaduras. Por favor, tome una pastilla fluconazol en una semana luego tomar otra dosis. Llame si la irritacin no desaparece. 3)   Por favor, vuelva en un mes para hablar sobre su diabetes Prescriptions: MIRALAX  POWD (POLYETHYLENE GLYCOL 3350) 1 cap in 8 oz of water q 4 hours until stool is soft.  #1 x 1   Entered and Authorized by:   Ellery Plunk MD   Signed by:   Ellery Plunk MD on 02/08/2010   Method used:   Faxed to ...       [facility] (retail)             [place], [place]         Ph:        Fax: [phone]   RxID:   [id number] FLUCONAZOLE 150 MG TABS  (FLUCONAZOLE) one tab by mouth x 1 day for yeast infection - may repeat x 1 dose if not improving  #2 x 1   Entered and Authorized by:   Ellery Plunk MD   Signed by:  Ellery Plunk MD on 02/08/2010   Method used:   Faxed to ...       [facility] (retail)             [place], [place]         Ph:        Fax: [phone]   RxID:   [id number] COLACE 100 MG CAPS (DOCUSATE SODIUM) take one two times a day  #60 x 11   Entered and Authorized by:   Ellery Plunk MD   Signed by:   Ellery Plunk MD on 02/08/2010   Method used:   Faxed to ...       [facility] (retail)             [place], [place]         Ph:        Fax: [phone]   RxID:   [id number] MIRALAX  POWD (POLYETHYLENE GLYCOL 3350) 1 cap in 8 oz of water q 4 hours until stool is soft.  #1 x 1   Entered and Authorized by:   Ellery Plunk MD   Signed by:   Ellery Plunk MD on 02/08/2010   Method used:   Electronically to        [facility] (retail)       [address]       [place], [place]  [address]       Ph: [phone]       Fax: [phone]   RxID:   [id number] FLUCONAZOLE 150 MG TABS (FLUCONAZOLE) one tab by mouth x 1 day for yeast infection - may repeat x 1 dose if not improving  #2 x 1   Entered and Authorized by:   Ellery Plunk MD   Signed by:   Ellery Plunk MD on 02/08/2010   Method used:   Electronically to        [facility] (retail)       [address]       [place], [place]  [address]       Ph: [phone]       Fax: [phone]   RxID:   [id number] COLACE 100 MG CAPS (DOCUSATE SODIUM) take one two times a day  #60 x 11   Entered and Authorized by:   Ellery Plunk MD   Signed by:   Ellery Plunk MD on 02/08/2010   Method used:   Electronically to        [facility] (retail)       [address]       [place], [place]  [address]       Ph:  [phone]       Fax: [phone]   RxID:   [id number]    Orders Added: 1)  Wet Prep- [facility] [87210] 2)  Pap Smear-[facility] [[id number]] 3)  Dental Referral [Dentist] 4)  [facility]- Est  Level 4 [85277]    Laboratory Results  Date/Time Received: February 08, 2010 11:33 AM  Date/Time Reported: February 08, 2010 11:38 AM   Wet [place] Source: vaginal WBC/hpf: 5-10 Bacteria/hpf: 3+  Rods Clue cells/hpf: none  Negative whiff Yeast/hpf: moderate Trichomonas/hpf: none Comments: ...........test performed by...........Marland KitchenTerese Door, CMA     Prevention & Chronic Care Immunizations   Influenza vaccine: Not documented  Tetanus booster: 09/18/2008: Tdap    Pneumococcal vaccine: Pneumovax  (09/18/2008)  Other Screening   Pap smear: NEGATIVE FOR INTRAEPITHELIAL LESIONS OR MALIGNANCY.  (12/16/2008)   Pap smear due: 12/17/2011    Smoking status: never  (02/08/2010)  Diabetes Mellitus   HgbA1C: 7.4  (12/10/2009)   Hemoglobin A1C due: 11/24/2009    Eye exam: Not documented   Diabetic eye exam action/deferral: Ophthalmology referral  (12/04/2008)    Foot exam: yes  (08/24/2009)   Foot exam action/deferral: Do today   High risk foot: Yes  (08/24/2009)   Foot care education: Done  (08/24/2009)   Foot exam due: 12/02/2009    Urine microalbumin/creatinine ratio: Not documented    Diabetes flowsheet reviewed?: Yes   Progress toward A1C goal: Unchanged  Lipids   Total Cholesterol: 150  (07/23/2008)   Lipid panel action/deferral: Not indicated   LDL: 88  (07/23/2008)   LDL Direct: Not documented   HDL: 39  (07/23/2008)   Triglycerides: 117  (07/23/2008)  Self-Management Support :   Personal Goals (by the next clinic visit) :     Personal A1C goal: 7  (05/18/2009)     Personal blood pressure goal: 140/90  (02/08/2010)     Personal LDL goal: 100  (02/08/2010)    Patient will work on the following items until the next clinic visit to reach self-care goals:     Medications  and monitoring: take my medicines every day, check my blood sugar, bring all of my medications to every visit, examine my feet every day  (05/18/2009)    Diabetes self-management support: Written self-care plan  (05/18/2009)    Diabetes self-management support not done because: Good outcomes  (02/18/2009)

## 2010-04-14 NOTE — Assessment & Plan Note (Addendum)
Summary: NUTRI APPT/KH   Vital Signs:  Patient profile:   35 year old female Height:      63 inches Weight:      215.4 pounds BMI:     38.29  Vitals Entered By: Wyona Almas PHD (September 28, 2009 4:37 PM)  Primary Care Provider:  Ellery Plunk MD   History of Present Illness: Assessment:  Spent 30 minutes with pt.  [name] has lost a significant amount of weight following illness of strep throat and gastroenteritis (down 12 lb since May, some of which was lost before illness).  She said her appetite is still not normal, and she talked of struggling with depression, especially since news of a loved one dying in [place].  She has been walking 30 minutes a day with her children, however, often at a quick pace, as the children run.  24-hr recall suggests intake of <900 kcal: B (8:30 AM)- coffee w/ 1/2 c 1% milk; Snk (10 AM)- apple; L (1 PM)- ~1 c beef, salsa, 1 slc bread, water; Snk (4:30 PM)- 1 orange, water; D (7:30 PM)- 1 oz tuna, tomato, onion, mayonnaise, 1 1/2 slc toast, water; Snk (9:40 PM)- 1 handful of Cheerios.   FBG has been running 100-120.  Kerly has not been eating breakfast usually, although she does get a snack mid-morning at least 3-4 X wk.    Nutrition Diagnosis: Stable progress on physical inactivity (NB-2.1) as evidenced by walking at least once a day.  Probably too much progress on excessive energy intake (NI-1.5) related to expenditure as evidenced by 24-hr recall of <900 kcal.   Intervention: See Patient Instructions.    Monitoring/Eval:  Dietary intake, body weight, and exercise at  ~6-wk F/U WITH INTERPRETOR.      Allergies: No Known Drug Allergies   Complete Medication List: 1)  Glyburide 5 Mg Tabs (Glyburide) .... Take two tablets daily 2)  True Track Meter Strips and Lancets  .... Check 4 times daily. disp 1 month supply 3)  Clotrimazole 1 % Crea (Clotrimazole) .... Apply topical two times a day.  disp qs x1 month. 4)  Lac-hydrin 12 % Lotn (Ammonium lactate)  .... Apply to feet 4 times daily.  disp qs x1 month. 5)  Metformin Hcl 1000 Mg Tabs (Metformin hcl) .... Take one two times a day 6)  Lisinopril 5 Mg Tabs (Lisinopril) .... Take one daily 7)  Colace 100 Mg Caps (Docusate sodium) .... Take one two times a day 8)  Penicillin V Potassium 500 Mg Tabs (Penicillin v potassium) .... One tab by mouth three times a day x 10 days 9)  Promethazine Hcl 12.5 Mg Tabs (Promethazine hcl) .... Take 1 every 6 hours as needed for nausea  Other [facility]  Patient Instructions: 1)  Include vegetables twice a day, and fruits twice a day.   2)  Try to get something to eat within the first hour that you are up.   3)  Continue to limit starch to 1 to 2 servings per meal.   4)  Continue to walk daily, at least 30 minutes.   (Get back to dancing when you can.) 5)  Please schedule a nutrition appointment in September.

## 2010-04-15 NOTE — Assessment & Plan Note (Addendum)
Summary: TOOTACHE/NEED ANTIBIOTIC FOR INFEC/PAIN MED & REFERRAL/[facility]   Vital Signs:  Patient profile:   35 year old female Height:      63 inches Weight:      216.4 pounds BMI:     38.47 Temp:     98.2 degrees F oral Pulse rate:   67 / minute BP sitting:   118 / 75  (left arm) Cuff size:   large  Vitals Entered By: Garen Grams LPN (March 31, 2010 2:15 PM) CC: toothache Is Patient Diabetic? No Pain Assessment Patient in pain? no        Primary Care Provider:  Ellery Plunk MD  CC:  toothache.  History of Present Illness: toothache x 2 months loose teeth, pain, bleeding and swelling.  no fevers since she had strep.    Habits & Providers  Alcohol-Tobacco-Diet     Tobacco Status: never     Cigarette Packs/Day: n/a  Current Medications (verified): 1)  Glyburide 5 Mg Tabs (Glyburide) .... Take Two Tablets Daily 2)  True Track Meter Strips and Lancets .... Check 4 Times Daily. Disp 1 Month Supply 3)  Clotrimazole 1 % Crea (Clotrimazole) .... Apply Topical Two Times A Day.  Disp Qs X1 Month. 4)  Lac-Hydrin 12 % Lotn (Ammonium Lactate) .... Apply To Feet 4 Times Daily.  Disp Qs X1 Month. 5)  Metformin Hcl 1000 Mg Tabs (Metformin Hcl) .... Take One Two Times A Day 6)  Lisinopril 5 Mg Tabs (Lisinopril) .... Take One Daily 7)  Colace 100 Mg Caps (Docusate Sodium) .... Take One Two Times A Day 8)  Promethazine Hcl 12.5 Mg Tabs (Promethazine Hcl) .... Take 1 Every 6 Hours As Needed For Nausea 9)  Fluconazole 150 Mg Tabs (Fluconazole) .... One Tab By Mouth X 1 Day For Yeast Infection - May Repeat X 1 Dose If Not Improving 10)  Miralax  Powd (Polyethylene Glycol 3350) .Marland Kitchen.. 1 Cap in 8 Oz of Water Q 4 Hours Until Stool Is Soft. 11)  Amoxicillin 500 Mg Caps (Amoxicillin) .... Take One Two Times A Day For 14 Days 12)  Vicodin 5-500 Mg Tabs (Hydrocodone-Acetaminophen) .... Take One Q 6 Hours As Needed Pain  Allergies (verified): No Known Drug Allergies  Review of Systems  The  patient denies fever, chest pain, and abdominal pain.    Physical Exam  General:  Well-developed,well-nourished,in no acute distress; alert,appropriate and cooperative throughout examination Mouth:  several loose teeth.  back lower molar bilatterally loose, tender with redness and swelling around them   Impression & Recommendations:  Problem # 1:  BLEEDING GUMS (ICD-523.8) Assessment Deteriorated back of mouth looks like some infection may be present.  will give amox and some pain meds.  refer to dentist.   Orders: [facility]- Est Level  3 (56387) Dental Referral (Dentist)  Complete Medication List: 1)  Glyburide 5 Mg Tabs (Glyburide) .... Take two tablets daily 2)  True Track Meter Strips and Lancets  .... Check 4 times daily. disp 1 month supply 3)  Clotrimazole 1 % Crea (Clotrimazole) .... Apply topical two times a day.  disp qs x1 month. 4)  Lac-hydrin 12 % Lotn (Ammonium lactate) .... Apply to feet 4 times daily.  disp qs x1 month. 5)  Metformin Hcl 1000 Mg Tabs (Metformin hcl) .... Take one two times a day 6)  Lisinopril 5 Mg Tabs (Lisinopril) .... Take one daily 7)  Colace 100 Mg Caps (Docusate sodium) .... Take one two times a day 8)  Promethazine  Hcl 12.5 Mg Tabs (Promethazine hcl) .... Take 1 every 6 hours as needed for nausea 9)  Fluconazole 150 Mg Tabs (Fluconazole) .... One tab by mouth x 1 day for yeast infection - may repeat x 1 dose if not improving 10)  Miralax Powd (Polyethylene glycol 3350) .Marland Kitchen.. 1 cap in 8 oz of water q 4 hours until stool is soft. 11)  Amoxicillin 500 Mg Caps (Amoxicillin) .... Take one two times a day for 14 days 12)  Vicodin 5-500 Mg Tabs (Hydrocodone-acetaminophen) .... Take one q 6 hours as needed pain  Patient Instructions: 1)  FOr your toothache, please take the amoxicillin twice a day for 14 days. 2)  Also, take the pain medicine as needed 3)  Go to [facility] to get recertified 4)  I will send the dental appointment and they should call with  the information 5)  please come back in one month 6)  Para su dolor de muelas, por favor tome la amoxicilina dos veces al da durante [address]. 7)   Adems, de tomar el medicamento cuando sea necesario 8)   Ir a [facility] para obtener recertificacin 9)   Yo enviar la cita con el dentista y que deben llamar a la informacin 10)   por favor regrese en un mes Prescriptions: VICODIN 5-500 MG TABS (HYDROCODONE-ACETAMINOPHEN) take one q 6 hours as needed pain  #15 x 0   Entered and Authorized by:   Ellery Plunk MD   Signed by:   Ellery Plunk MD on 03/31/2010   Method used:   Print then Give to Patient   RxID:   [id number] AMOXICILLIN 500 MG CAPS (AMOXICILLIN) take one two times a day for 14 days  #28 x 0   Entered and Authorized by:   Ellery Plunk MD   Signed by:   Ellery Plunk MD on 03/31/2010   Method used:   Electronically to        [facility] (retail)       [address]       [place], [place]  [address]       Ph: [phone]       Fax: [phone]   RxID:   [id number]    Orders Added: 1)  [facility]- Est Level  3 [[address]403] 2)  Dental Referral [Dentist]

## 2010-05-06 ENCOUNTER — Ambulatory Visit (INDEPENDENT_AMBULATORY_CARE_PROVIDER_SITE_OTHER): Payer: Self-pay | Admitting: Family Medicine

## 2010-05-06 ENCOUNTER — Other Ambulatory Visit: Payer: Self-pay | Admitting: Family Medicine

## 2010-05-06 ENCOUNTER — Encounter: Payer: Self-pay | Admitting: Family Medicine

## 2010-05-06 VITALS — BP 122/79 | HR 82 | Temp 98.7°F | Ht <= 58 in | Wt 213.1 lb

## 2010-05-06 DIAGNOSIS — R300 Dysuria: Secondary | ICD-10-CM

## 2010-05-06 DIAGNOSIS — N760 Acute vaginitis: Secondary | ICD-10-CM

## 2010-05-06 DIAGNOSIS — K055 Other periodontal diseases: Secondary | ICD-10-CM

## 2010-05-06 DIAGNOSIS — E119 Type 2 diabetes mellitus without complications: Secondary | ICD-10-CM

## 2010-05-06 DIAGNOSIS — K089 Disorder of teeth and supporting structures, unspecified: Secondary | ICD-10-CM

## 2010-05-06 DIAGNOSIS — M109 Gout, unspecified: Secondary | ICD-10-CM

## 2010-05-06 LAB — POCT URINALYSIS DIPSTICK
Glucose, UA: 500
Leukocytes, UA: NEGATIVE
Nitrite, UA: NEGATIVE
Urobilinogen, UA: 0.2

## 2010-05-06 LAB — POCT WET PREP (WET MOUNT)

## 2010-05-06 LAB — POCT UA - MICROSCOPIC ONLY

## 2010-05-06 LAB — CONVERTED CEMR LAB: Chlamydia, DNA Probe: NEGATIVE

## 2010-05-06 LAB — POCT GLYCOSYLATED HEMOGLOBIN (HGB A1C): Hemoglobin A1C: 11.1

## 2010-05-06 MED ORDER — FLUCONAZOLE 150 MG PO TABS: 150.0000 mg | ORAL_TABLET | Freq: Every day | ORAL | Status: DC

## 2010-05-06 MED ORDER — GLYBURIDE 5 MG PO TABS: 10.0000 mg | ORAL_TABLET | Freq: Every day | ORAL | Status: DC

## 2010-05-06 MED ORDER — HYDROCODONE-ACETAMINOPHEN 5-500 MG PO TABS: 1.0000 | ORAL_TABLET | Freq: Four times a day (QID) | ORAL | Status: DC | PRN

## 2010-05-06 MED ORDER — CEPHALEXIN 500 MG PO CAPS: 500.0000 mg | ORAL_CAPSULE | Freq: Two times a day (BID) | ORAL | Status: AC

## 2010-05-06 MED ORDER — LISINOPRIL 5 MG PO TABS: 5.0000 mg | ORAL_TABLET | Freq: Every day | ORAL | Status: DC

## 2010-05-06 MED ORDER — METRONIDAZOLE 500 MG PO TABS: 500.0000 mg | ORAL_TABLET | Freq: Three times a day (TID) | ORAL | Status: AC

## 2010-05-06 MED ORDER — METFORMIN HCL 1000 MG PO TABS: 1000.0000 mg | ORAL_TABLET | Freq: Two times a day (BID) | ORAL | Status: DC

## 2010-05-06 NOTE — Assessment & Plan Note (Signed)
Dental referral in. Metronidazole and keflex today to cover oral bacteria and presumed uti.  vicodin for pain

## 2010-05-06 NOTE — Assessment & Plan Note (Addendum)
Lab Results  Component Value Date   HGBA1C 11.1 05/06/2010   I think that her blood sugar may be up because of dental infection.  However, after we get her to the dentist, if not improved will need to discuss insulin.  Continue current regimen for now

## 2010-05-06 NOTE — Assessment & Plan Note (Signed)
ua and culture today.  Keflex.  Last uti grew e coli sensitive to keflex

## 2010-05-06 NOTE — Assessment & Plan Note (Signed)
?   If this is candida vs BV.  No candida on wet prep, but considering symptoms, CBGs and mult antibiotics, will give course of diflucan

## 2010-05-06 NOTE — Progress Notes (Signed)
  Subjective:    Patient ID: [name], female    DOB: 1975/12/19, 35 y.o.   MRN: [id number]    DIABETES  Disease Monitoring: Blood Sugar ranges-AM 105-115 Polyuria- denies New Visual problems- none  Medications: Compliance- good with medications, doing better with diet Hypoglycemic symptoms- none    Tooth pain- continues, pt feels that her face is swollen on the left side due to pain, pressure from teeth  Gout- pt reports visit to ED and diagnosis of gout.  Better within a few days.  No other flairs  UTI- treated 1 month ago in ED, again having symptoms of burning, having some incontinence with walking, which is new for her    ROS See HPI above   PMH Smoking Status noted -nonsmoker    HPI   Review of Systems Denies fevers, CP, SOB, abdominal pain    Objective:   Physical Exam  Vitals reviewed.     Physical Examination: General appearance - alert, well appearing, and in no distress, oriented to person, place, and time and overweight Mental status - alert, oriented to person, place, and time, normal mood, behavior, speech, dress, motor activity, and thought processes Eyes - pupils equal and reactive, extraocular eye movements intact, sclera anicteric Mouth - mucous membranes moist, pharynx normal without lesions, dental hygiene poor and left lower teeth loose and gums swollen do not have pus Neck - supple, no significant adenopathy Chest - clear to auscultation, no wheezes, rales or rhonchi, symmetric air entry Heart - normal rate, regular rhythm, normal S1, S2, no murmurs, rubs, clicks or gallops, normal rate and regular rhythm Abdomen - soft, nontender, nondistended, no masses or organomegaly Pelvic - VULVA: normal appearing vulva with no masses, tenderness or lesions, VAGINA: vaginal tenderness with insertion of speculum, vaginal discharge - white and thin, WET MOUNT done - results: KOH done, clue cells, excessive bacteria, DNA probe for chlamydia and GC  obtained, CERVIX: cervical discharge present - white, cervical motion tenderness present, UTERUS: uterus is normal size, shape, consistency and nontender, ADNEXA: normal adnexa in size, nontender and no masses, WET MOUNT done - results: KOH done, clue cells, excessive bacteria, exam chaperoned by nurse Extremities - peripheral pulses normal, no pedal edema, no clubbing or cyanosis, feet normal, good pulses, normal color, temperature and sensation, monofilament sensory exam is normal in both feet Skin - normal coloration and turgor, no rashes, no suspicious skin lesions noted     Assessment & Plan:

## 2010-05-06 NOTE — Patient Instructions (Signed)
Por favor regrese si sus sntomas empeoran, o si su gota empeora.  Por favor, tome los antibiticos  Voy a llamar si tenemos que cambiarlos  por favor traiga su medidor prxima vez  regresar en 3 meses

## 2010-05-07 LAB — GC/CHLAMYDIA PROBE AMP, GENITAL
Chlamydia, DNA Probe: NEGATIVE
GC Probe Amp, Genital: NEGATIVE

## 2010-05-08 LAB — URINE CULTURE: Colony Count: >=100000

## 2010-05-30 LAB — BASIC METABOLIC PANEL
BUN: 8 mg/dL (ref 6–23)
Chloride: 103 mEq/L (ref 96–112)
Potassium: 3.5 mEq/L (ref 3.5–5.1)
Sodium: 135 mEq/L (ref 135–145)

## 2010-05-30 LAB — URINALYSIS, ROUTINE W REFLEX MICROSCOPIC
Glucose, UA: >1000 mg/dL — AB
Ketones, ur: NEGATIVE mg/dL
Leukocytes, UA: NEGATIVE
Nitrite: NEGATIVE
Protein, ur: 100 mg/dL — AB
Urobilinogen, UA: 1 mg/dL (ref 0.0–1.0)

## 2010-05-30 LAB — GLUCOSE, CAPILLARY: Glucose-Capillary: 344 mg/dL — ABNORMAL HIGH (ref 70–99)

## 2010-05-30 LAB — POCT PREGNANCY, URINE: Preg Test, Ur: NEGATIVE

## 2010-05-30 LAB — URINE MICROSCOPIC-ADD ON

## 2010-06-22 LAB — GLUCOSE, CAPILLARY: Glucose-Capillary: 103 mg/dL — ABNORMAL HIGH (ref 70–99)

## 2010-06-23 ENCOUNTER — Inpatient Hospital Stay (INDEPENDENT_AMBULATORY_CARE_PROVIDER_SITE_OTHER)
Admission: RE | Admit: 2010-06-23 | Discharge: 2010-06-23 | Disposition: A | Discharge status: Home / Self Care | Payer: Self-pay | Source: Ambulatory Visit | Attending: Emergency Medicine | Admitting: Emergency Medicine

## 2010-06-23 ENCOUNTER — Ambulatory Visit (INDEPENDENT_AMBULATORY_CARE_PROVIDER_SITE_OTHER): Payer: Self-pay

## 2010-06-23 DIAGNOSIS — K5900 Constipation, unspecified: Secondary | ICD-10-CM

## 2010-06-23 DIAGNOSIS — R109 Unspecified abdominal pain: Secondary | ICD-10-CM

## 2010-06-23 LAB — POCT URINALYSIS DIP (DEVICE)
Bilirubin Urine: NEGATIVE
Glucose, UA: NEGATIVE mg/dL
Ketones, ur: NEGATIVE mg/dL
Specific Gravity, Urine: 1.025 (ref 1.005–1.030)

## 2010-06-23 LAB — WET PREP, GENITAL
WBC, Wet Prep HPF POC: NONE SEEN
Yeast Wet Prep HPF POC: NONE SEEN

## 2010-06-23 LAB — POCT PREGNANCY, URINE: Preg Test, Ur: NEGATIVE

## 2010-06-24 LAB — GC/CHLAMYDIA PROBE AMP, GENITAL: Chlamydia, DNA Probe: NEGATIVE

## 2010-07-05 ENCOUNTER — Encounter: Payer: Self-pay | Admitting: Family Medicine

## 2010-07-05 ENCOUNTER — Ambulatory Visit (INDEPENDENT_AMBULATORY_CARE_PROVIDER_SITE_OTHER): Payer: Self-pay | Admitting: Family Medicine

## 2010-07-05 VITALS — BP 124/64 | HR 73 | Temp 98.8°F | Ht 58.25 in | Wt 218.0 lb

## 2010-07-05 DIAGNOSIS — M62830 Muscle spasm of back: Secondary | ICD-10-CM | POA: Insufficient documentation

## 2010-07-05 DIAGNOSIS — M545 Low back pain: Secondary | ICD-10-CM

## 2010-07-05 DIAGNOSIS — M5380 Other specified dorsopathies, site unspecified: Secondary | ICD-10-CM

## 2010-07-05 LAB — POCT UA - MICROSCOPIC ONLY

## 2010-07-05 LAB — POCT URINALYSIS DIPSTICK
Bilirubin, UA: NEGATIVE
Glucose, UA: NEGATIVE
Ketones, UA: NEGATIVE
Leukocytes, UA: NEGATIVE
Nitrite, UA: NEGATIVE
pH, UA: 5.5

## 2010-07-05 MED ORDER — CYCLOBENZAPRINE HCL 5 MG PO TABS: 5.0000 mg | ORAL_TABLET | Freq: Three times a day (TID) | ORAL | Status: AC | PRN

## 2010-07-05 NOTE — Assessment & Plan Note (Signed)
Muscle spasm of paraspinus muscle.  rec flexiril, 3 days of motrin and heat.  RTC if not improved or worsened.  Unlikely kidney stone and urine not consisitent with UTI

## 2010-07-05 NOTE — Patient Instructions (Signed)
Fue muy agradable verte hoy  Te estoy dando un relajante muscular para tratar de la noche. que puede causar sueo.  Adems, durante los prximos 3 das, tomar Motrin tres veces al [employer] calor puede ayudar, as mantener activa para ayudar al tramo msculos y curar pero no hasta el punto de dolor

## 2010-07-05 NOTE — Progress Notes (Signed)
  Subjective:    Patient ID: [name], female    DOB: 01-Oct-1975, 35 y.o.   MRN: [id number]  HPI  Back pain in flank area for 1 week.  Has been seen at urgent care and treated for BV and constipation.  Constipation improved but back pain no better.  Has been active with zumba but doesn't think she injured herself.  Review of Systems    deneis CP, SOB Objective:   Physical Exam    Vital signs reviewed General appearance - alert, well appearing, and in no distress and oriented to person, place, and time MSK: back with full ROM, nontender over spine but pressing on paraspinus muscles on left at L2-3 replicates symptoms.      Assessment & Plan:

## 2010-07-09 ENCOUNTER — Inpatient Hospital Stay (INDEPENDENT_AMBULATORY_CARE_PROVIDER_SITE_OTHER)
Admission: RE | Admit: 2010-07-09 | Discharge: 2010-07-09 | Disposition: A | Discharge status: Home / Self Care | Payer: Self-pay | Source: Ambulatory Visit | Attending: Family Medicine | Admitting: Family Medicine

## 2010-07-09 ENCOUNTER — Emergency Department (HOSPITAL_COMMUNITY)
Admission: EM | Admit: 2010-07-09 | Discharge: 2010-07-10 | Disposition: A | Discharge status: Home / Self Care | Payer: Self-pay | Attending: Emergency Medicine | Admitting: Emergency Medicine

## 2010-07-09 ENCOUNTER — Emergency Department (HOSPITAL_COMMUNITY): Payer: Self-pay

## 2010-07-09 DIAGNOSIS — N898 Other specified noninflammatory disorders of vagina: Secondary | ICD-10-CM | POA: Insufficient documentation

## 2010-07-09 DIAGNOSIS — Z8639 Personal history of other endocrine, nutritional and metabolic disease: Secondary | ICD-10-CM | POA: Insufficient documentation

## 2010-07-09 DIAGNOSIS — R109 Unspecified abdominal pain: Secondary | ICD-10-CM | POA: Insufficient documentation

## 2010-07-09 DIAGNOSIS — R10819 Abdominal tenderness, unspecified site: Secondary | ICD-10-CM | POA: Insufficient documentation

## 2010-07-09 DIAGNOSIS — B9689 Other specified bacterial agents as the cause of diseases classified elsewhere: Secondary | ICD-10-CM | POA: Insufficient documentation

## 2010-07-09 DIAGNOSIS — N760 Acute vaginitis: Secondary | ICD-10-CM | POA: Insufficient documentation

## 2010-07-09 DIAGNOSIS — R112 Nausea with vomiting, unspecified: Secondary | ICD-10-CM | POA: Insufficient documentation

## 2010-07-09 DIAGNOSIS — E119 Type 2 diabetes mellitus without complications: Secondary | ICD-10-CM | POA: Insufficient documentation

## 2010-07-09 DIAGNOSIS — Z862 Personal history of diseases of the blood and blood-forming organs and certain disorders involving the immune mechanism: Secondary | ICD-10-CM | POA: Insufficient documentation

## 2010-07-09 DIAGNOSIS — R197 Diarrhea, unspecified: Secondary | ICD-10-CM | POA: Insufficient documentation

## 2010-07-09 DIAGNOSIS — A499 Bacterial infection, unspecified: Secondary | ICD-10-CM | POA: Insufficient documentation

## 2010-07-09 LAB — URINALYSIS, ROUTINE W REFLEX MICROSCOPIC
Bilirubin Urine: NEGATIVE
Glucose, UA: NEGATIVE mg/dL
Ketones, ur: NEGATIVE mg/dL
Protein, ur: NEGATIVE mg/dL
pH: 5.5 (ref 5.0–8.0)

## 2010-07-09 LAB — URINE MICROSCOPIC-ADD ON

## 2010-07-09 LAB — CBC
MCH: 23.7 pg — ABNORMAL LOW (ref 26.0–34.0)
MCV: 73.7 fL — ABNORMAL LOW (ref 78.0–100.0)
Platelets: 205 10*3/uL (ref 150–400)
RDW: 15.6 % — ABNORMAL HIGH (ref 11.5–15.5)

## 2010-07-09 LAB — LIPASE, BLOOD: Lipase: 29 U/L (ref 11–59)

## 2010-07-09 LAB — WET PREP, GENITAL

## 2010-07-09 LAB — COMPREHENSIVE METABOLIC PANEL
Albumin: 3.3 g/dL — ABNORMAL LOW (ref 3.5–5.2)
BUN: 8 mg/dL (ref 6–23)
Creatinine, Ser: 0.41 mg/dL (ref 0.4–1.2)
Glucose, Bld: 111 mg/dL — ABNORMAL HIGH (ref 70–99)
Total Bilirubin: 0.6 mg/dL (ref 0.3–1.2)
Total Protein: 6.7 g/dL (ref 6.0–8.3)

## 2010-07-09 LAB — DIFFERENTIAL
Eosinophils Absolute: 0.1 10*3/uL (ref 0.0–0.7)
Eosinophils Relative: 2 % (ref 0–5)
Lymphs Abs: 1.9 10*3/uL (ref 0.7–4.0)
Monocytes Absolute: 0.4 10*3/uL (ref 0.1–1.0)
Monocytes Relative: 5 % (ref 3–12)
Neutrophils Relative %: 64 % (ref 43–77)

## 2010-07-10 MED ORDER — IOHEXOL 300 MG/ML  SOLN: 100.0000 mL | Freq: Once | INTRAMUSCULAR | Status: DC | PRN

## 2010-07-12 ENCOUNTER — Ambulatory Visit (INDEPENDENT_AMBULATORY_CARE_PROVIDER_SITE_OTHER): Payer: Self-pay | Admitting: Family Medicine

## 2010-07-12 VITALS — BP 143/94 | HR 96 | Temp 99.1°F | Ht 58.25 in | Wt 215.0 lb

## 2010-07-12 DIAGNOSIS — R300 Dysuria: Secondary | ICD-10-CM

## 2010-07-12 DIAGNOSIS — E119 Type 2 diabetes mellitus without complications: Secondary | ICD-10-CM

## 2010-07-12 DIAGNOSIS — R109 Unspecified abdominal pain: Secondary | ICD-10-CM

## 2010-07-12 LAB — POCT UA - MICROSCOPIC ONLY

## 2010-07-12 LAB — POCT URINALYSIS DIPSTICK
Glucose, UA: NEGATIVE
Ketones, UA: NEGATIVE
Nitrite, UA: NEGATIVE
Protein, UA: 30
Spec Grav, UA: >=1.03
Spec Grav, UA: 1.025

## 2010-07-12 LAB — POCT URINALYSIS DIP (DEVICE)
Glucose, UA: NEGATIVE mg/dL
Nitrite: NEGATIVE
Protein, ur: >=300 mg/dL — AB
Specific Gravity, Urine: >=1.03 (ref 1.005–1.030)
Urobilinogen, UA: 0.2 mg/dL (ref 0.0–1.0)
pH: 5.5 (ref 5.0–8.0)

## 2010-07-12 LAB — GC/CHLAMYDIA PROBE AMP, GENITAL
Chlamydia, DNA Probe: NEGATIVE
GC Probe Amp, Genital: NEGATIVE

## 2010-07-12 MED ORDER — AZITHROMYCIN 1 G PO PACK
1.0000 g | PACK | Freq: Once | ORAL | Status: AC
  Administered 2010-07-12: 1 g via ORAL

## 2010-07-12 MED ORDER — CEFTRIAXONE SODIUM 250 MG IJ SOLR
250.0000 mg | Freq: Once | INTRAMUSCULAR | Status: AC
  Administered 2010-07-12: 250 mg via INTRAMUSCULAR

## 2010-07-12 NOTE — Assessment & Plan Note (Signed)
Now with cervical motion tenderness. Will treat as PID

## 2010-07-12 NOTE — Progress Notes (Signed)
  Subjective:    Patient ID: Josem Kaufmann, female    DOB: 1975/11/02, 35 y.o.   MRN: [id number]  HPISra Benito-Rosete has had lower abdominal pain and vaginal discharge for weeks. A GC/clamydia test on 06/23/10 was negative and she says her husband has not had penile symptoms. During that ER visit she complains of lower abdominal pain x 3 days and was diagnosed with constipation after an unremarkable pelvic exam, abd film and negative urinalysis.  She had been taking Metronidazole since early March and still had clue cells on that visit. until She was evaluated for vomiting, diarrhea, and LLQ and pelvic pain x 2 days 07/09/10.and was seen at the [facility] urgent care center then sent to the [facility] ER where she had a negative pelvic exam and abdominal/pelvic CT that showed only sludge vs small stones in the gall bladder. Urinalysis was again not suggestive of UTI. Her WBC was 6.8K and Hgb 10.8 microcytic, CMET normal, few clue cells on wet prep.   She comes to the [facility] today because of persistent pelvic and L sided pain, chills but no fever, and dysuria with urgency and nocturia x 4.   Also complains of R knee pain she attributes to gout  Review of Systems     Objective:   Physical ExamMorbidly obese. Mildly ill appearing Intermittently holding lower abdomen.  Chest clear Cor RR no murmur Back tender L CVA area, not on Right Abdomen. Obese soft, mild guarding in the epigastrium, L side and suprapubic area Pelvic No external lesions, Vagina not inflamed, watery clear discharge, Cervix Nabothian cyst, + cervical motion tenderness that was severe. Exam limited by this and obesity.       Assessment & Plan:

## 2010-07-12 NOTE — Assessment & Plan Note (Signed)
Contributing to DM and limiting physical examination

## 2010-07-12 NOTE — Assessment & Plan Note (Addendum)
Urine sent for culture since no periurethral sx to account for sx. Will ask her to have her husband come in for GC/chlamydia

## 2010-07-12 NOTE — Patient Instructions (Addendum)
Hacemos un cultivo de la orina de la sonda y vamos a [employer] en tres dias. Voy a llamarle con los [place].  We did a urine culture. I will call you with the results in 3 days.   Porque Ud tiene mucho dolor pelvico, la inyeccion y medicamento para tomar fue tratarle para la posibilidad de infeccion trasmitido por Clinical research associate. Antes de [employer] sexuales otra vez, su esposo debe venir por una prueba de infeccion.  Because you have much pain on pelvic examination, we are going to treat you for infection there. Continue not to have sexual relations with your husband until after he is checked for infection.   Si no mejora el dolor en tres dias, tiene vomitos, o fiebre mas de 101, regrese a la clinica mas temprano. Return to the clinic if you have persistent vomiting,  fever over 101, or pain.   Make an appointment for your husband to have a check-up with Dr Hulen Luster or in [facility]. He shouldn't urinate for an hour before the visit.

## 2010-07-12 NOTE — Assessment & Plan Note (Signed)
Poorly controlled recently per last A1c

## 2010-07-12 NOTE — Progress Notes (Signed)
  Subjective:    Patient ID: [name], female    DOB: [birth year], 35 y.o.   MRN: [id number]  HPI DYSURIA Started today.  No hematuria. Nocturia x 3-4 x last night.  No fever. A little nausea. +Left flank pain. No diarrhea.   Currently being treated for BV with Flagyl since March 2012.  Was seen at [facility] and ED on 07/09/10 and CT abd/pelvis showed no acute abd or pelvic abscess. +Sludge within gall bladder.  and exam at that time showed  Abdomen: soft, suprapubic tenderness, LLQ tenderness, RLQ tenderness, nondistended, no masses, no hepatosplenomegaly, normal bowel sounds, no guarding, no rebound tenderness Genitourinary: no vaginal discharge, no adnexal mass, no adnexal tenderness, uterus normal size and nontender, no CVA tenderness, vaginal bleeding NOTE - Menstruation on pelvic exam. Body habitus limits exam  Review of Systems     Objective:   Physical Exam Gen: obese, mild        Assessment & Plan:

## 2010-07-14 LAB — URINE CULTURE: Colony Count: 25000

## 2010-07-14 MED ORDER — CEPHALEXIN 500 MG PO CAPS: 500.0000 mg | ORAL_CAPSULE | Freq: Three times a day (TID) | ORAL | Status: AC

## 2010-07-14 NOTE — Progress Notes (Signed)
Addended by: Zachery Dauer on: 07/14/2010 03:20 PM   Modules accepted: Orders

## 2010-07-14 NOTE — Progress Notes (Signed)
  Subjective:    Patient ID: [name], female    DOB: 1975-05-06, 35 y.o.   MRN: [id number]  HPIShe came by office in response to my call with lab results. She says the vomiting has resolved and her LLQ pain is better    Review of Systems     Objective:   Physical ExamAppears well (seen in hall) Urine culture growing gram negatives 25 K from cath specimen        Assessment & Plan:  She was given a written Rx for Cephalexin and told to schedule follow up with Dr Hulen Luster in 10 days.

## 2010-07-27 NOTE — Op Note (Signed)
NAME:  [name], [name]       ACCOUNT NO.:  [id number]   MEDICAL RECORD NO.:  [id number]          PATIENT TYPE:  WOC   LOCATION:  WOC                          FACILITY:  [facility]   PHYSICIAN:  Lesly Dukes, M.D. DATE OF BIRTH:  1976/03/08   DATE OF PROCEDURE:  DATE OF DISCHARGE:                               OPERATIVE REPORT   PREOPERATIVE DIAGNOSES:  1. Intrauterine pregnancy at 39 weeks 4 days' gestation.  2. Oligohydramnios.  3. Type 2 diabetes.  4. Lapse of prenatal care.  5. History of previous cesarean section x2.  6. Desires sterilization.   POSTOPERATIVE DIAGNOSES:  1. Intrauterine pregnancy at 39 weeks 4 days gestation.  2. Oligohydramnios.  3. Type 2 diabetes.  4. Lapse of prenatal care.  5. History of previous cesarean section x2.  6. Extensive intraabdominal adhesions.  7. Desires sterilization.   PROCEDURE:  Repeat low-transverse cesarean section with bilateral tubal  occlusion.   SURGEON:  Grant Ruts, MD   ASSISTANT:  Karlton Lemon, MD   ANESTHESIA:  Spinal.   FINDINGS:  1. Viable infant female, weighing 8 pounds 12 ounces, Apgars 9 at 1      minute, 9 at 5 minutes with cord pH of 7.27.  2. Clear amniotic fluid.  3. Extensive intraabdominal adhesions.  4. Normal female pelvic anatomy including tubes, ovaries, and uterus.   ESTIMATED BLOOD LOSS:  800 mL.   DRAINS:  Foley with clear yellow urine.   COMPLICATIONS:  None immediate.   SPECIMENS:  1. Placenta, labor and delivery.  2. Cord pH to the lab.  3. Cord blood to the lab.   INDICATIONS FOR PROCEDURE:  [name] is a gravida 3, para 2-0-0-  2 at 55 weeks 4 days' gestation who presents for a repeat low-transverse  cesarean section after being found to have oligohydramnios in clinic  today.  She also has a diagnosis of diabetes mellitus and has had 2  previous cesarean sections.   DESCRIPTION OF PROCEDURE:  The patient was taken to the operating room  and after adequate spinal  anesthesia, was prepped and draped in the  usual sterile manner in the supine position with left lateral uterine  displacement.  After ensuring adequate anesthesia, a Pfannenstiel skin  incision was made using the scalpel.  The incision was carried down  through the subcutaneous tissues using the scalpel.  The rectus fascia  was nicked in the midline and the incision was extended laterally in  each direction using the Mayo scissors.  The rectus muscle was dissected  free of the fascia superiorly with both sharp and blunt dissection.  Rectus diastasis was noted and the rectus muscles were separated in the  midline.  A Maylard incision of the rectus muscles bilaterally was  performed to provide good visualization in the intraabdominal cavity.  The peritoneum has been broken down with the opening of the rectus  muscles.  Bladder blade was placed and a reflection of the visceral  peritoneum superior to the bladder was identified, elevated, and incised  using Metzenbaum scissors.  The incision was then extended laterally and  a bladder flap was created digitally and retracted  with bladder blade.  Low-transverse uterine incision was made using the scalpel and the  incision was extended laterally and superiorly using blunt dissection  and bandage scissors.  A hand was placed within uterine cavity and used  to elevate and flex the head of the infant, which was delivered without  difficulty.  The infant was bulb suctioned after delivery.  The body of  the infant was delivered without difficulty.  The cord was doubly  clamped and cut and the infant handed to the nursery team in attendance.  Specimens were collected for cord blood and cord pH.  Placenta was  delivered with cord traction and appeared intact.  The endometrial  cavity was wiped free of any traces of membranes using wet laparotomy  sponges.  The edges of the uterine incision were grasped with Allis  clamps and the uterine incision was  closed with 1 layer of running  locking stitch of 0 Vicryl.  Small areas of bleeding were controlled  using 1 figure-of-eight stitch.  The uterine incision was inspected and  found to have good hemostasis.  Attention was then turned to the  patient's left tube, which was clamped with 2 Babcock clamps and  identified out to the fimbria.  A Filshie clip was placed between the 2  Babcock clamps under direct visualization at the isthmus-ampullary  junction.  Babcocks released and the tube was allowed to fall back  within the peritoneal cavity.  The right tube was then identified and  clamped with 2 Babcock clamps.  It was then followed out to the fimbria  and then a Filshie clip was placed between 2 Babcock clamps and isthmus-  ampullary junction.  Once again, the tube was released from the Babcocks  clamps and allowed to fall back within the peritoneal cavity.  The  uterine incision was inspected once more and found to have adequate  hemostasis.  The rectus muscle was inspected for areas of bleeding and a  running stitch of 0 Chromic was used to control bleeding of the left  rectus muscle.  Remainder of bleeding of the rectus muscles were  controlled using Bovie cautery.  Good hemostasis was obtained for the  rectus muscles.  The fascia was then reapproximated with 1 stitch of  looped PDS in a running locking fashion.  The skin was then  reapproximated with stainless steel skin staples.  Sponge, needle, and  instrument counts were correct x3.  The patient tolerated the procedure  well and went to the postanesthesia care unit in stable condition.     Karlton Lemon, MD  Electronically Signed     ______________________________  Lesly Dukes, M.D.   NS/MEDQ  D:  07/24/2007  T:  07/25/2007  Job:  [id number]

## 2010-07-30 NOTE — Discharge Summary (Signed)
NAMEMYKAILA, BLUNCK NO.:  [id number]   MEDICAL RECORD NO.:  [id number]          PATIENT TYPE:  WOC   LOCATION:  WOC                          FACILITY:  [facility]   PHYSICIAN:  Lesly Dukes, M.D. DATE OF BIRTH:  Aug 07, 1975   DATE OF ADMISSION:  12/22/2004  DATE OF DISCHARGE:  12/27/2004                                 DISCHARGE SUMMARY   ADMISSION DIAGNOSIS:  Repeat elective cesarean section.   DISCHARGE DIAGNOSIS:  Repeat elective cesarean section.   PROCEDURE:  Low transverse cesarean section on December 24, 2004.   HISTORY OF PRESENT ILLNESS:  In brief, Ms. Presley Raddle is a 35 year old, Hispanic  female, G2, now P2 who underwent a repeat low transverse C-section on  December 24, 2004, at 39-2/7 weeks and also had gestational diabetes which  was uncontrolled.  She delivered a viable female infant at 1339 hours with  Apgar's of 9 and 9 in the cephalic position.  Infant's weight was 8 pounds  12 ounces.  Placenta delivered at 1341 hours.  Estimated blood loss was 500  mL and intact three-vessel cord was clamped at that time and delivered.  On  postpartum day #3, the patient was allowed to return home.  The patient had  no complications postop.   LABORATORY DATA AND X-RAY FINDINGS:  Rubella immune, O positive.  GBS  negative.  Postpartum hemoglobin 9.2, hematocrit 27.3 on December 25, 2004.   DISCHARGE MEDICATIONS:  1.  Glyburide 5 mg tablet take 2 tablets p.o. daily with the first meal,      dispense #60, all refills through primary care physician.  2.  Micro-Nor 0.35 mg one tablet p.o. daily, 2 month's supply.  All refills      through primary care physician.  3.  Percocet 5/325 mg one to two tablets p.o. q.4-6h. p.r.n. pain, dispense      #20.  All refills through primary care physician.  4.  Prenatal vitamin one tablet p.o. daily while breast-feeding, dispense      #30.  All refills through primary care physician.  5.  Ibuprofen 600 mg one tablet p.o. q.4-6h.  p.r.n. cramping, dispense #20.      All refills through primary care physician.   ACTIVITY:  No heavy lifting anything larger than the size of the infant for  1-2 weeks.   DIET:  Low carbohydrate/diabetic diet.   CONDITION ON DISCHARGE:  Her status was well.   FOLLOW UP:  She was instructed to follow up at [facility] in 4-6 weeks  and to call for this appointment.      Alanson Puls, M.D.    ______________________________  Lesly Dukes, M.D.    MR/MEDQ  D:  12/27/2004  T:  12/27/2004  Job:  [id number]

## 2010-07-30 NOTE — Discharge Summary (Signed)
[name], [name]       ACCOUNT NO.:  [id number]   MEDICAL RECORD NO.:  [id number]          PATIENT TYPE:  INP   LOCATION:  9126                          [facility]   PHYSICIAN:  Lesly Dukes, M.D. DATE OF BIRTH:  11/30/1975   DATE OF ADMISSION:  07/23/2007  DATE OF DISCHARGE:  07/26/2007                               DISCHARGE SUMMARY   ADMITTING DIAGNOSES:  1. Intrauterine pregnancy at 38 weeks and 4/7th days with      oligohydramnios.  2. Repeat cesarean section.  3. Diabetes type 2.  4. Questionable hypertension.  5. Previous cesarean section x2.   DISCHARGE DIAGNOSES:  1. Status post repeat low-transverse cesarean section and bilateral      tubal sterilization.  2. Type 2 diabetic, on oral agents.   PROCEDURE:  The patient had a repeat low-transverse cesarean section and  bilateral tubal sterilization.   REASON FOR ADMISSION:  Please see written H and P.   HOSPITAL COURSE:  [name] is a 35 year old, gravida 3, para 2-0-0-2,  who presented at 38 weeks and 4 days and was found to have an AFI of 4.5  and a lapse in care times approximately 3 months.  Given her  oligohydramnios status and desire for repeat cesarean section, the  decision was made to admit the patient with plans for repeat cesarean  section and bilateral tubal sterilization.  The patient underwent this  procedure, it was performed by Dr. Penne Lash and Dr. Darrol Angel.  It produced  a viable female infant weighing 8 pounds and 12 ounces.  Apgars were 9 and  9.  She was found to have extensive adhesions.  Estimated blood loss was  800 mL and there were no complications reported.  For details, please  see the dictated operative note of this procedure.  On hospital day #1,  the patient's vital signs were stable.  Pain was being well controlled  on Motrin.  Her exam was within normal limits.  On postoperative day #2,  the patient again is doing well with complaints of moderate pain, being  tolerated  on oral pain meds.  Her vital signs were stable.  Exam was  within normal limits.  Her white count was 7.7, hematocrit was 27.8,  hemoglobin 9.7, and platelets 166.  On hospital day #2, her oral  glyburide 2.5 mg was restarted secondary to elevated blood sugars.  On  postoperative day #2, her fasting sugar was 150, postprandial was 138.  The patient was discharged on postoperative day #3, feeling well, pain  being well controlled on p.o. meds.  Exam was within normal limits.  Her  incision was clean, dry, and intact and staples were in place without  signs of infection.  Her blood sugars were improved on her glyburide.   CONDITION ON DISCHARGE:  Good.   DIET:  Diabetic diet as tolerated.   ACTIVITY:  No heavy lifting, no driving x2 weeks, and no vaginal entry.   FOLLOWUP:  [facility]  [facility] and the [facility] nurse will come to the patient's  home to discontinue her  staples on postoperative day #5 through #7.  She  was given instructions concerning signs and symptoms of infection and  reasons to return to the hospital.   DISCHARGE MEDICATIONS:  1. Percocet 5/325 with instructions of 1-2 p.o. q.4-6h. p.r.n. pain.      She was dispensed #30.  2. Motrin 600 mg every 6 hours p.r.n. pain.  3. Ferrous sulfate 325 mg 1 p.o. daily.  4. Colace 100 mg p.o. daily as needed.  5. Glyburide 2.5 mg p.o. nightly.      Maylon Cos, C.N.M.      Lesly Dukes, M.D.  Electronically Signed    SS/MEDQ  D:  09/13/2007  T:  09/13/2007  Job:  [id number]

## 2010-07-30 NOTE — Op Note (Signed)
[name], [name] NO.:  [id number]   MEDICAL RECORD NO.:  [id number]          PATIENT TYPE:  INP   LOCATION:  9199                          [facility]ACILITY[facility]   PHYSICIAN:  Lesly Dukes, M.D. DATE OF BIRTH:  04-24-1975   DATE OF PROCEDURE:  12/24/2004  DATE OF DISCHARGE:                                 OPERATIVE REPORT   PREOPERATIVE DIAGNOSES:  1.  Thirty-nine and 2/7 weeks' intrauterine pregnancy, history of cesarean      section, elect repeat.  2.  Gestational diabetes, uncontrolled.  3.  Elective repeat, history of previous cesarean section.   POSTOPERATIVE DIAGNOSES:  1.  Thirty-nine and 2/7 weeks' intrauterine pregnancy, history of cesarean      section, elect repeat.  2.  Gestational diabetes, uncontrolled.  3.  Elective repeat, history of previous cesarean section.   PROCEDURE:  Elective repeat low transverse cesarean section via  Pfannenstiel.   SURGEON:  Lesly Dukes, M.D.   ASSISTANTKaroline Caldwell B. Merlene Morse, M.D.   ANESTHESIA:  Spinal.   COMPLICATIONS:  None.   ESTIMATED BLOOD LOSS:  500 mL.   FLUID:  2 L.   URINE OUTPUT:  200 mL clear urine at end of procedure.   INDICATIONS:  A 35 year old G2, P1, at 39-2/7 weeks with gestational  diabetes with previous C-section, desirous of elective repeat.   FINDINGS:  A female infant in cephalic presentation.  Pediatrics present at  delivery.  Apgars 9 and 9.  Weight 8 pounds 14 ounces.  Normal uterus,  tubes, and ovaries.   PROCEDURE:  The patient is taken to the operating room, where epidural  anesthesia was found to be adequate.  She was then prepped and draped in the  normal sterile fashion in the dorsal supine position with a leftward tilt.  A Pfannenstiel skin incision was then made with a scalpel and carried  through to the underlying layer of fascia with a scalpel and the Bovie.  The  fascia was incised in the midline and the incision extended laterally with  the Mayo scissors.   The superior aspect of the fascial incision was then  grasped with Kocher clamp, elevated and the underlying rectus muscle was  dissected off bluntly.  Attention was then turned to the inferior aspect of  the incision, which in serial fashion was grasped with a Kocher clamp and  the rectus muscle dissected off bluntly.  The rectus muscle was then  separated in the midline and then was separated laterally in a Maylard  fashion.  The peritoneum identified, tented up and entered sharply with the  Metzenbaum scissors.  The peritoneal incision was then extended superiorly  and inferiorly with good visualization of the bladder.  The bladder blade  was then inserted and the vesicouterine peritoneum identified, grasped with  the pick-ups and entered sharply with the Metzenbaum scissors.  The incision  was then extended laterally with the bladder flap created digitally.  The  bladder blade was then reinserted and the lower uterine segment incised in a  transverse fashion with the scalpel.  The uterine incision was then  extended  laterally with the bandage scissors.  The bladder blade was then removed and  the infant's head delivered atraumatically.  The nose and mouth were  suctioned with the DeLee suction.  The cord was clamped and cut and the  infant was handed off to the waiting pediatrician.  Cord gas was 7.26.  A  cord blood was sent.  The placenta was then removed manually, the uterus  exteriorized, cleared of all clots and debris.  The uterine incision was  repaired in a running locked fashion, bladder flap.  Gutters were cleared of  all clots and the muscle was reapproximated with a single stitch and  Interceed was then placed.  The fascia was reapproximated in a running  fashion.  The skin was closed with staples.  The patient tolerated the  procedure well.  The sponge, lap and needle count were correct x2.  Ancef 1  g was given at cord clamp.  The patient was taken to the recovery room in   stable condition.     ______________________________  August Saucer Merlene Morse, MD    ______________________________  Lesly Dukes, M.D.    ABC/MEDQ  D:  12/24/2004  T:  12/24/2004  Job:  [id number]

## 2010-07-30 NOTE — Op Note (Signed)
[facility]  Patient:    [name]                        MRN: [id number] Proc. Date: 04/03/99 Adm. Date:  [id number] Attending:  Antionette Char Dictator:   Bernette Redbird, M.D. CC:         Marcelle Overlie, M.D.                           Operative Report  PREOPERATIVE DIAGNOSES:       1. Thirty-eight and 2/7-week intrauterine pregnancy.                               2. Arrest of dilation.  POSTOPERATIVE DIAGNOSES:      1. Thirty-eight and 2/7-week intrauterine pregnancy.                               2. Arrest of dilation.  PROCEDURE:                    Primary low transverse cesarean section via Pfannenstiel.  SURGEON:                      Marcelle Overlie, M.D.  ASSISTANT:                    Bernette Redbird, M.D.  ANESTHESIA:                   Epidural.  COMPLICATIONS:                None.  ESTIMATED BLOOD LOSS:         700 ml.  FLUIDS:                       1100 ml crystalloid.  URINE OUTPUT:                 100 ml clear urine.  INDICATIONS:                  [age]-year-old gravida 1, para 0, induced for oligohydramnios, maximum dilation 3 cm.  FINDINGS:                     A female infant in cephalic occipitoposterior position delivered at 0315, thin green meconium, none below the cords, pediatrics present at delivery, nuchal cord x 1, Apgars 8/9, weight 6 pounds 4 ounces. Normal uterus, tubes and ovaries.  DESCRIPTION OF PROCEDURE:     The patient was brought to the operating room and  after satisfactory redosing of the epidural, the abdomen was prepped and draped in the usual sterile fashion in the dorsal supine position with a leftward tilt.  Pfannenstiel skin incision was made with a Bovie and a scalpel, that was deepened down to the fascia.  The fascia was nicked in the midline and the fascial incision was extended to the left and to the right with curved Mayo scissors.  The superior and inferior  fascial edges were taken off the rectus muscle with both blunt and  sharp dissection.  The rectus muscle was divided in the midline both bluntly and sharply with curved Mayo scissors, being careful to avoid the urinary bladder inferiorly.  The peritoneum was grasped  with the hemostats and was sharply entered and the incision was extended superiorly and inferiorly, being careful to avoid the urinary bladder inferiorly.  The bladder blade was positioned and the vesicouterine fold of the peritoneum above the reflection of urinary bladder was grasped with the forceps and was incised and undermined with the Metzenbaum scissors.  The incision was extended to the left and to the right with the Metzenbaum scissors.  The bladder flap was bluntly developed and the bladder blade was repositioned in front of the urinary bladder, placing it well out of the operative field.  The uterus was entered transversely in the lower uterine segment with a scalpel.  A moderate amount of very thin meconium-stained fluid was expelled.  The vertex was noted o be in the occipitoposterior position.  The vertex was delivered with the aid of a vacuum suction cup and fundal pressure from the assistant.  The infants mouth and nose were suctioned with the DeLee suction trap.  The delivery was then completed with the aid of fundal pressure from the assistant.  The umbilical cord was clamped and cut and the infant was handed off to the awaiting pediatricians.  Cord blood was obtained and the placenta was manually removed from the uterine cavity. The uterus was exteriorized and the edges of the uterine incision were grasped with the ring forceps.  The uterine incision was then closed with a continuous interlocking suture of 0 chromic.  Hemostasis was excellent and the uterus was then placed back in its normal anatomic position.  The closure of the uterus was again observed or hemostasis and there was no active  bleeding noted.  The abdomen was then closed as follows:  The peritoneum was approximated with a running suture of 0 Vicryl, the rectus muscle was approximated with a few sutures of 0 Vicryl, the fascia was reapproximated with 0 Vicryl from each corner to the center, all areas of subcutaneous bleeding were coagulated with the Bovie; the skin was then approximated with surgical stainless steel staples.  Surgical technician indicated that all needle, sponge and instrument counts were correct.  The patient tolerated the procedure well and was transported to the recovery room in satisfactory condition. DD:  04/03/99 TD:  04/06/99 Job: [id number]

## 2010-08-27 ENCOUNTER — Inpatient Hospital Stay (INDEPENDENT_AMBULATORY_CARE_PROVIDER_SITE_OTHER)
Admission: RE | Admit: 2010-08-27 | Discharge: 2010-08-27 | Disposition: A | Discharge status: Home / Self Care | Payer: Self-pay | Source: Ambulatory Visit | Attending: Family Medicine | Admitting: Family Medicine

## 2010-08-27 DIAGNOSIS — T6391XA Toxic effect of contact with unspecified venomous animal, accidental (unintentional), initial encounter: Secondary | ICD-10-CM

## 2010-08-27 DIAGNOSIS — B9789 Other viral agents as the cause of diseases classified elsewhere: Secondary | ICD-10-CM

## 2010-08-27 LAB — GLUCOSE, CAPILLARY: Glucose-Capillary: 136 mg/dL — ABNORMAL HIGH (ref 70–99)

## 2010-08-30 ENCOUNTER — Ambulatory Visit (INDEPENDENT_AMBULATORY_CARE_PROVIDER_SITE_OTHER): Payer: Self-pay | Admitting: Family Medicine

## 2010-08-30 VITALS — BP 122/82 | HR 80 | Temp 98.2°F | Ht 58.25 in | Wt 216.0 lb

## 2010-08-30 DIAGNOSIS — E119 Type 2 diabetes mellitus without complications: Secondary | ICD-10-CM

## 2010-08-30 DIAGNOSIS — R109 Unspecified abdominal pain: Secondary | ICD-10-CM

## 2010-08-30 DIAGNOSIS — N760 Acute vaginitis: Secondary | ICD-10-CM

## 2010-08-30 DIAGNOSIS — J069 Acute upper respiratory infection, unspecified: Secondary | ICD-10-CM

## 2010-08-30 LAB — POCT GLYCOSYLATED HEMOGLOBIN (HGB A1C): Hemoglobin A1C: 7.4

## 2010-08-30 LAB — POCT WET PREP (WET MOUNT)
Trichomonas Wet Prep HPF POC: NEGATIVE
Yeast Wet Prep HPF POC: NEGATIVE

## 2010-08-30 NOTE — Assessment & Plan Note (Addendum)
Not at goal of A1c under 7, but stable at 7.4. I emphasized that she should lose weight to prevent worsening control of diabetes

## 2010-08-30 NOTE — Assessment & Plan Note (Signed)
Current wet prep does not indicate recurrence of bacteria vaginosis

## 2010-08-30 NOTE — Assessment & Plan Note (Signed)
Mild symptoms at this point without signs of complications

## 2010-08-30 NOTE — Progress Notes (Signed)
  Subjective:    Patient ID: [name], female    DOB: Jul 27, 1975, 35 y.o.   MRN: [id number]  HPIShe's had two days of nasal congestion and cough. Nausea but no fever, vomiting, or diarrhea.  She has had chronic pelvic pain since the birth of her last child. Her vaginal symptoms have recurred, ie vaginal discharge. Now aware of husband having penile symptoms.  She has found ticks in her bed and had one attached yesterday and also 3 weeks ago    Review of Systems     Objective:   Physical Exam  Constitutional:       Morbidly obese  Cardiovascular: Normal rate and regular rhythm.   No murmur heard. Pulmonary/Chest: Effort normal and breath sounds normal.  Abdominal: Soft. She exhibits no distension and no mass. There is tenderness. There is no rebound and no guarding.  Genitourinary: Uterus normal. Vaginal discharge found.       Minimal white discharge sent for wet prep Cervix benign appearing Marked cervical motion tenderness.  Abdominal obesity prevents bimanual examination  Skin: Skin is warm and dry.  Psychiatric: She has a normal mood and affect. Her behavior is normal.          Assessment & Plan:

## 2010-08-30 NOTE — Assessment & Plan Note (Addendum)
Chronic. GC and Chlamydia test repeated. Won't treat or test husband unless one is positive

## 2010-08-30 NOTE — Patient Instructions (Signed)
  Porque Ud tiene mucho dolor pelvico, hice una prueba para la posibilidad de infeccion trasmitido por Clinical research associate.  Su esposo debe venir por una prueba de infeccion.   Si no mejora el dolor en tres dias, tiene vomitos, o fiebre mas de 101, regrese a la clinica mas temprano. Return to the clinic if you have persistent vomiting, fever over 101, or pain.   Vamos a contactarle con los [employer]. La prueba vaginal no indica una vaginitis.   La prueba de diabetes, A1c hoy esta 7.4, lo mismo que en Septiembre. La meta es menos de 7.0, pero esta cerca. Lo mas importante es bajar de peso para mejorar la diabetes. Continua con los mismos medicamentos. Regrese en 3 meses para otra cheque de diabetes.   Para tos, puede comprar un jarabe sin azucar con dextromethorphan en la farmacia sin receta.

## 2010-09-30 ENCOUNTER — Telehealth (HOSPITAL_COMMUNITY): Payer: Self-pay | Admitting: Family Medicine

## 2010-09-30 NOTE — Telephone Encounter (Signed)
Pt called and stated that has a dizziness,difficulty swallowing,weak tongue, headache,vomiting. Dennison Nancy recommended pt to go to ER. [employer]

## 2010-10-01 ENCOUNTER — Emergency Department (HOSPITAL_COMMUNITY)
Admission: EM | Admit: 2010-10-01 | Discharge: 2010-10-02 | Disposition: A | Discharge status: Home / Self Care | Payer: Self-pay | Attending: Emergency Medicine | Admitting: Emergency Medicine

## 2010-10-01 DIAGNOSIS — R209 Unspecified disturbances of skin sensation: Secondary | ICD-10-CM | POA: Insufficient documentation

## 2010-10-01 DIAGNOSIS — J029 Acute pharyngitis, unspecified: Secondary | ICD-10-CM | POA: Insufficient documentation

## 2010-10-01 DIAGNOSIS — E119 Type 2 diabetes mellitus without complications: Secondary | ICD-10-CM | POA: Insufficient documentation

## 2010-10-01 DIAGNOSIS — B9789 Other viral agents as the cause of diseases classified elsewhere: Secondary | ICD-10-CM | POA: Insufficient documentation

## 2010-10-01 LAB — POCT I-STAT, CHEM 8
HCT: 33 % — ABNORMAL LOW (ref 36.0–46.0)
Hemoglobin: 11.2 g/dL — ABNORMAL LOW (ref 12.0–15.0)
Potassium: 3.5 mEq/L (ref 3.5–5.1)
Sodium: 140 mEq/L (ref 135–145)
TCO2: 22 mmol/L (ref 0–100)

## 2010-10-02 LAB — URINALYSIS, ROUTINE W REFLEX MICROSCOPIC
Ketones, ur: 15 mg/dL — AB
Leukocytes, UA: NEGATIVE
Protein, ur: NEGATIVE mg/dL
Urobilinogen, UA: 1 mg/dL (ref 0.0–1.0)

## 2010-10-02 LAB — RAPID STREP SCREEN (MED CTR MEBANE ONLY): Streptococcus, Group A Screen (Direct): NEGATIVE

## 2010-10-08 ENCOUNTER — Emergency Department (HOSPITAL_COMMUNITY)
Admission: EM | Admit: 2010-10-08 | Discharge: 2010-10-08 | Disposition: A | Discharge status: Home / Self Care | Payer: Self-pay | Attending: Emergency Medicine | Admitting: Emergency Medicine

## 2010-10-08 DIAGNOSIS — R197 Diarrhea, unspecified: Secondary | ICD-10-CM | POA: Insufficient documentation

## 2010-10-08 DIAGNOSIS — Z8639 Personal history of other endocrine, nutritional and metabolic disease: Secondary | ICD-10-CM | POA: Insufficient documentation

## 2010-10-08 DIAGNOSIS — R112 Nausea with vomiting, unspecified: Secondary | ICD-10-CM | POA: Insufficient documentation

## 2010-10-08 DIAGNOSIS — Z862 Personal history of diseases of the blood and blood-forming organs and certain disorders involving the immune mechanism: Secondary | ICD-10-CM | POA: Insufficient documentation

## 2010-10-08 DIAGNOSIS — R509 Fever, unspecified: Secondary | ICD-10-CM | POA: Insufficient documentation

## 2010-10-08 DIAGNOSIS — R109 Unspecified abdominal pain: Secondary | ICD-10-CM | POA: Insufficient documentation

## 2010-10-08 DIAGNOSIS — R300 Dysuria: Secondary | ICD-10-CM | POA: Insufficient documentation

## 2010-10-08 DIAGNOSIS — E119 Type 2 diabetes mellitus without complications: Secondary | ICD-10-CM | POA: Insufficient documentation

## 2010-10-08 DIAGNOSIS — Z79899 Other long term (current) drug therapy: Secondary | ICD-10-CM | POA: Insufficient documentation

## 2010-10-08 LAB — CBC
HCT: 33.3 % — ABNORMAL LOW (ref 36.0–46.0)
MCHC: 32.7 g/dL (ref 30.0–36.0)
MCV: 73.8 fL — ABNORMAL LOW (ref 78.0–100.0)
Platelets: 199 10*3/uL (ref 150–400)
RDW: 15.1 % (ref 11.5–15.5)
WBC: 10.4 10*3/uL (ref 4.0–10.5)

## 2010-10-08 LAB — URINALYSIS, ROUTINE W REFLEX MICROSCOPIC
Bilirubin Urine: NEGATIVE
Leukocytes, UA: NEGATIVE
Nitrite: NEGATIVE
Specific Gravity, Urine: 1.029 (ref 1.005–1.030)
Urobilinogen, UA: 1 mg/dL (ref 0.0–1.0)
pH: 6 (ref 5.0–8.0)

## 2010-10-08 LAB — DIFFERENTIAL
Basophils Absolute: 0.1 10*3/uL (ref 0.0–0.1)
Eosinophils Absolute: 0.1 10*3/uL (ref 0.0–0.7)
Eosinophils Relative: 1 % (ref 0–5)
Lymphs Abs: 1.2 10*3/uL (ref 0.7–4.0)
Monocytes Absolute: 0.5 10*3/uL (ref 0.1–1.0)

## 2010-10-08 LAB — COMPREHENSIVE METABOLIC PANEL
AST: 28 U/L (ref 0–37)
Albumin: 3.6 g/dL (ref 3.5–5.2)
BUN: 7 mg/dL (ref 6–23)
Chloride: 105 mEq/L (ref 96–112)
Creatinine, Ser: <0.47 mg/dL — ABNORMAL LOW (ref 0.50–1.10)
Total Bilirubin: 0.4 mg/dL (ref 0.3–1.2)
Total Protein: 7.6 g/dL (ref 6.0–8.3)

## 2010-10-08 LAB — URINE MICROSCOPIC-ADD ON

## 2010-10-08 LAB — POCT PREGNANCY, URINE: Preg Test, Ur: NEGATIVE

## 2010-10-08 LAB — LIPASE, BLOOD: Lipase: 49 U/L (ref 11–59)

## 2010-10-13 ENCOUNTER — Encounter: Payer: Self-pay | Admitting: Family Medicine

## 2010-10-13 ENCOUNTER — Ambulatory Visit (INDEPENDENT_AMBULATORY_CARE_PROVIDER_SITE_OTHER): Payer: Self-pay | Admitting: Family Medicine

## 2010-10-13 DIAGNOSIS — K089 Disorder of teeth and supporting structures, unspecified: Secondary | ICD-10-CM

## 2010-10-13 DIAGNOSIS — E119 Type 2 diabetes mellitus without complications: Secondary | ICD-10-CM

## 2010-10-13 DIAGNOSIS — K219 Gastro-esophageal reflux disease without esophagitis: Secondary | ICD-10-CM

## 2010-10-13 DIAGNOSIS — J029 Acute pharyngitis, unspecified: Secondary | ICD-10-CM

## 2010-10-13 DIAGNOSIS — R1084 Generalized abdominal pain: Secondary | ICD-10-CM | POA: Insufficient documentation

## 2010-10-13 LAB — POCT URINALYSIS DIPSTICK
Bilirubin, UA: NEGATIVE
Glucose, UA: NEGATIVE
Leukocytes, UA: NEGATIVE
pH, UA: 5.5

## 2010-10-13 LAB — POCT UA - MICROSCOPIC ONLY

## 2010-10-13 MED ORDER — BLOOD GLUCOSE METER KIT: PACK | Status: DC

## 2010-10-13 MED ORDER — GLYBURIDE 5 MG PO TABS: 10.0000 mg | ORAL_TABLET | Freq: Every day | ORAL | Status: DC

## 2010-10-13 NOTE — Progress Notes (Signed)
  Subjective:    Patient ID: [name], female    DOB: October 02, 1975, 35 y.o.   MRN: [id number]  HPI hfu from ED visit for sore throat, fever, N/V/D  Sore throat and fever- 2 weeks ago with fever to 101, no fever now, no cough.  Still sore throat esp when swallowing.  Initial ED labs showed neg rapid strep.    GI upset- nausea/vomiting/diarrhea since 7/27 ED visit.  Able to eat but has BM soon after.  No blood.  Diarrhea slightly improved.  Some occasional pain with urination, no increase in frequency.   Review of Systems Denies CP, SOB, HA     Objective:   Physical Exam  Vital signs reviewed General appearance - alert, well appearing, and in no distress and oriented to person, place, and time Heart - normal rate, regular rhythm, normal S1, S2, no murmurs, rubs, clicks or gallops Chest - clear to auscultation, no wheezes, rales or rhonchi, symmetric air entry, no tachypnea, retractions or cyanosis ABD-  Tender diffusely but worse in mid lower area above bladder, no rebound, pain is mild      Assessment & Plan:

## 2010-10-13 NOTE — Assessment & Plan Note (Signed)
2 weeks of sore throat with fever 2 weeks ago.  Check for strep.

## 2010-10-13 NOTE — Assessment & Plan Note (Signed)
Teeth loose but not infected.  Waiting for dental visit

## 2010-10-13 NOTE — Assessment & Plan Note (Addendum)
Lingering loose stools likely due to virus.  Will check u/a since tender over bladder.  Will also try prilosec.  Consider IBS diagnosis if not improving.  Some dizziness, will check orthostatics and advised hydration

## 2010-10-13 NOTE — Assessment & Plan Note (Signed)
May be playing a role in her stomach upset.  Will put on prilosec

## 2010-10-13 NOTE — Assessment & Plan Note (Signed)
Up to date on a1c.   Lab Results  Component Value Date   HGBA1C 7.4 08/30/2010   Continue current regimen.  Needs new meter.  CBGs have been ok through illness per ED labs

## 2010-10-13 NOTE — Patient Instructions (Signed)
Lamento que usted no se siente bien  Hoy nos marchamos de su orina y Administrator.  Por favor, beber mucha agua. por lo menos 6-8 vasos al da para mantenerse hidratado durante el verano.  Creo que el mareo es debido a la deshidratacin de la diarrea  Creo que la diarrea se debe a un virus y se pasan lentamente  Trate de comer muchos alimentos con fibra para frenar la diarrea  Estos incluyen frutas y verduras y suplementos de fibra como Metamucil

## 2010-10-27 ENCOUNTER — Encounter: Payer: Self-pay | Admitting: Family Medicine

## 2010-10-27 ENCOUNTER — Ambulatory Visit (INDEPENDENT_AMBULATORY_CARE_PROVIDER_SITE_OTHER): Payer: Self-pay | Admitting: Family Medicine

## 2010-10-27 DIAGNOSIS — E119 Type 2 diabetes mellitus without complications: Secondary | ICD-10-CM

## 2010-10-27 DIAGNOSIS — R51 Headache: Secondary | ICD-10-CM

## 2010-10-27 NOTE — Progress Notes (Signed)
  Subjective:    Patient ID: [name], female    DOB: [birth year], 35 y.o.   MRN: [id number]  HPI  24 hour food recall.  Woke at 5am.  At 11am ate 2 spoons of tuna salad, a chicken breast and a veggie enchilada.  Had water.  At 6pm she had a slice of watermelon.    She occasionally eats toast with butter for breakfast.  She likes Christy Gentles and is willing to try that for breakfast.  HA/nausea-  Daily.  Full head HA, uses 325mg  of tylenol occasionally which doesn't help.  Occasionally has dizziness with activity.    DM-  CBGs <140.  Taking meds.  Low energy.  Review of Systems    Denies CP, SOB, fever  Objective:   Physical Exam  Vital signs reviewed General appearance - alert, well appearing, and in no distress and oriented to person, place, and time Heart - normal rate, regular rhythm, normal S1, S2, no murmurs, rubs, clicks or gallops Chest - clear to auscultation, no wheezes, rales or rhonchi, symmetric air entry, no tachypnea, retractions or cyanosis       Assessment & Plan:  HEADACHE HA and nausea daily.  She is eating only at lunch.  No red flags.  Will try having her eat 3 meals a day no exceptions for 2 weeks and see if that helps her.    DIABETES-TYPE 2 CBGs all less than 140, some in the 90s.  Pt not eating regularly.  Encouraged regular meals.  Check A1C at next visit.  Morbid obesity Overall low calorie diet for the whole day but not eating enough to meet her nutritional needs.  Gave plan of 3 meals/day.  Will extend to other recommendations at next visit.

## 2010-10-27 NOTE — Patient Instructions (Signed)
Lo siento te sientes mal  Vamos a intentar regular su dieta para las [address], [address], trate de comer desayuno, Corporate treasurer y Mudlogger  tratar de Teacher, music algo de protena (como Curator) en el desayuno

## 2010-10-27 NOTE — Assessment & Plan Note (Signed)
CBGs all less than 140, some in the 90s.  Pt not eating regularly.  Encouraged regular meals.  Check A1C at next visit.

## 2010-10-27 NOTE — Assessment & Plan Note (Signed)
HA and nausea daily.  She is eating only at lunch.  No red flags.  Will try having her eat 3 meals a day no exceptions for 2 weeks and see if that helps her.

## 2010-10-27 NOTE — Assessment & Plan Note (Signed)
Overall low calorie diet for the whole day but not eating enough to meet her nutritional needs.  Gave plan of 3 meals/day.  Will extend to other recommendations at next visit.

## 2010-11-17 ENCOUNTER — Ambulatory Visit (INDEPENDENT_AMBULATORY_CARE_PROVIDER_SITE_OTHER): Payer: Self-pay | Admitting: Family Medicine

## 2010-11-17 ENCOUNTER — Encounter: Payer: Self-pay | Admitting: Family Medicine

## 2010-11-17 VITALS — BP 106/76 | HR 82 | Temp 98.0°F | Wt 217.4 lb

## 2010-11-17 DIAGNOSIS — B3731 Acute candidiasis of vulva and vagina: Secondary | ICD-10-CM | POA: Insufficient documentation

## 2010-11-17 DIAGNOSIS — B373 Candidiasis of vulva and vagina: Secondary | ICD-10-CM

## 2010-11-17 DIAGNOSIS — E119 Type 2 diabetes mellitus without complications: Secondary | ICD-10-CM

## 2010-11-17 DIAGNOSIS — R109 Unspecified abdominal pain: Secondary | ICD-10-CM

## 2010-11-17 MED ORDER — FLUCONAZOLE 150 MG PO TABS: 150.0000 mg | ORAL_TABLET | Freq: Once | ORAL | Status: DC

## 2010-11-17 NOTE — Assessment & Plan Note (Signed)
Concern for fibroid today. Will send for ultrasound to eval.  Could consider OCPs for dysmennorhea.  Will try motrin q8 for beginning of period.

## 2010-11-17 NOTE — Assessment & Plan Note (Signed)
Lab Results  Component Value Date   HGBA1C 6.4 11/17/2010   Well controlled continue current treatmetn

## 2010-11-17 NOTE — Assessment & Plan Note (Signed)
Feels that she has an infection that is the same as one that resolved with diflucan.  Does not want exam today, on period.  Will send diflucan

## 2010-11-17 NOTE — Progress Notes (Signed)
  Subjective:    Patient ID: [name], female    DOB: Oct 03, 1975, 35 y.o.   MRN: [id number]  HPI  DM- pt trying to eat more regularly.  Eating breakfast now.  No low CBGs.  No HA or N/V.  Taking meds.    Vaginal itch-  Having vaginal itch similar to one treated well with diflucan.  On period today and does not want exam.  No dysuria or odor.  Some thick d/c  Painful periods-  Periods are regular with one week heavy and 3 weeks off.  They have always been heavy however over the last 6 months they have become increasingly painful.  She says that when they start, she is has back and pelvic pain.  She denies diarrhea.    Review of Systems Denies CP, SOB, HA, N/V/D, fever     Objective:   Physical Exam Vital signs reviewed General appearance - alert, well appearing, and in no distress and oriented to person, place, and time Heart - normal rate, regular rhythm, normal S1, S2, no murmurs, rubs, clicks or gallops Chest - clear to auscultation, no wheezes, rales or rhonchi, symmetric air entry, no tachypnea, retractions or cyanosis        Assessment & Plan:  DIABETES-TYPE 2 Lab Results  Component Value Date   HGBA1C 6.4 11/17/2010   Well controlled continue current treatmetn  PELVIC  PAIN Concern for fibroid today. Will send for ultrasound to eval.  Could consider OCPs for dysmennorhea.  Will try motrin q8 for beginning of period.   Yeast infection involving the vagina and surrounding area Feels that she has an infection that is the same as one that resolved with diflucan.  Does not want exam today, on period.  Will send diflucan

## 2010-11-17 NOTE — Patient Instructions (Signed)
para su dolor con sus perodos, por favor tome motrin al inicio del dolor . Usted debe tomar 3 tabletas, 600 mg, cada 8 horas.   Te envo un ultrasonido

## 2010-11-22 ENCOUNTER — Other Ambulatory Visit: Payer: Self-pay | Admitting: Family Medicine

## 2010-11-22 ENCOUNTER — Ambulatory Visit (HOSPITAL_COMMUNITY)
Admission: RE | Admit: 2010-11-22 | Discharge: 2010-11-22 | Disposition: A | Discharge status: Home / Self Care | Payer: Self-pay | Source: Ambulatory Visit | Attending: Family Medicine | Admitting: Family Medicine

## 2010-11-22 DIAGNOSIS — N949 Unspecified condition associated with female genital organs and menstrual cycle: Secondary | ICD-10-CM | POA: Insufficient documentation

## 2010-11-22 DIAGNOSIS — R109 Unspecified abdominal pain: Secondary | ICD-10-CM

## 2010-12-01 ENCOUNTER — Ambulatory Visit: Payer: Self-pay | Admitting: Family Medicine

## 2010-12-02 LAB — POCT URINALYSIS DIP (DEVICE)
Protein, ur: NEGATIVE
Protein, ur: NEGATIVE
Specific Gravity, Urine: 1.02
Specific Gravity, Urine: 1.015
Urobilinogen, UA: 0.2
Urobilinogen, UA: 0.2
pH: 7

## 2010-12-03 LAB — POCT URINALYSIS DIP (DEVICE)
Glucose, UA: NEGATIVE
Glucose, UA: NEGATIVE
Hgb urine dipstick: NEGATIVE
Ketones, ur: NEGATIVE
Ketones, ur: 15 — AB
Protein, ur: 30 — AB
Specific Gravity, Urine: 1.025
Specific Gravity, Urine: 1.02
Urobilinogen, UA: 1
Urobilinogen, UA: 0.2

## 2010-12-15 ENCOUNTER — Ambulatory Visit (INDEPENDENT_AMBULATORY_CARE_PROVIDER_SITE_OTHER): Payer: Self-pay | Admitting: Family Medicine

## 2010-12-15 ENCOUNTER — Encounter: Payer: Self-pay | Admitting: Family Medicine

## 2010-12-15 DIAGNOSIS — R109 Unspecified abdominal pain: Secondary | ICD-10-CM

## 2010-12-15 DIAGNOSIS — N760 Acute vaginitis: Secondary | ICD-10-CM

## 2010-12-15 LAB — POCT WET PREP (WET MOUNT): Trichomonas Wet Prep HPF POC: NEGATIVE

## 2010-12-15 MED ORDER — METRONIDAZOLE 500 MG PO TABS: 500.0000 mg | ORAL_TABLET | Freq: Two times a day (BID) | ORAL | Status: AC

## 2010-12-15 MED ORDER — TERCONAZOLE 0.8 % VA CREA: 1.0000 | TOPICAL_CREAM | Freq: Every day | VAGINAL | Status: AC

## 2010-12-15 NOTE — Patient Instructions (Signed)
Me ha enviado en un medicamento para la infeccin por yeast  Por favor, use este durante 7 das.  Te envo una resonancia magntica de la pelvis.  Esto debera ayudarnos a ver si hay un problema en el tero que est causando su dolor.  por favor, ven a verme en 2 semanas  Return to clinic in 2 weeks

## 2010-12-15 NOTE — Assessment & Plan Note (Addendum)
No change. Patient has pelvic pain throughout her cycle unrelated to her menses. She also has painful periods. She agrees to try Motrin every 8 hours at the beginning of each period. I checked a wet prep today to check for yeast. She has diabetes and this contributes to her recurrent yeast infections. I will treat this patient with Terazol cream x7 days. On discussion with Dr. Lloyd Huger it was decided to send this patient for a pelvic MRI to rule out adenomyosis.

## 2010-12-15 NOTE — Progress Notes (Signed)
  Subjective:    Patient ID: [name], female    DOB: [birth year], 35 y.o.   MRN: [id number]  HPI  Patient presents for followup of ultrasound results as well as for pelvic pain. We reviewed the ultrasound results that were normal. Patient states that she has not had a period since her last visit. Her LMP is 11/12/2010.  has not had any cramping or bleeding since that. The patient stated at last visit that her periods are becoming increasingly painful. Patient notes that the ultrasound probe was painful and sex is also painful for her. She states that she has a thick white discharge and some mild itching. She also states that she has pelvic pain with movement sitting and standing throughout her cycle with relation to her menses  Review of Systems She denies weight loss nausea vomiting or diarrhea    Objective:   Physical Exam Vital signs reviewed General appearance - alert, well appearing, and in no distress and oriented to person, place, and time Abdominal: Patient reports pain with palpation over pelvic area especially on left and right sides. GYN-external genitalia appear normal. Patient is very sensitive at the introitus. She was in pain with a Q-tip exam. She described moderate to intense pain with pressure from a Q-tip at three six and 9:00.      Assessment & Plan:

## 2010-12-15 NOTE — Progress Notes (Signed)
Addended by: Reginold Agent on: 12/15/2010 04:18 PM   Modules accepted: Orders

## 2010-12-16 ENCOUNTER — Inpatient Hospital Stay (INDEPENDENT_AMBULATORY_CARE_PROVIDER_SITE_OTHER)
Admission: RE | Admit: 2010-12-16 | Discharge: 2010-12-16 | Disposition: A | Discharge status: Home / Self Care | Payer: Self-pay | Source: Ambulatory Visit | Attending: Family Medicine | Admitting: Family Medicine

## 2010-12-16 ENCOUNTER — Telehealth: Payer: Self-pay | Admitting: Family Medicine

## 2010-12-16 DIAGNOSIS — R51 Headache: Secondary | ICD-10-CM

## 2010-12-16 DIAGNOSIS — J069 Acute upper respiratory infection, unspecified: Secondary | ICD-10-CM

## 2010-12-16 NOTE — Telephone Encounter (Signed)
Patient has started her period and wants to make sure she is to continue  cream for yeast . Advised to continue.

## 2010-12-16 NOTE — Telephone Encounter (Signed)
Want to talk to nurse regarding cream for yeast infection because she has started her cycle.  The interpreter,Janet is there for about another hr.  Would like for nurse to call then

## 2010-12-23 ENCOUNTER — Ambulatory Visit (HOSPITAL_COMMUNITY)
Admission: RE | Admit: 2010-12-23 | Discharge: 2010-12-23 | Disposition: A | Discharge status: Home / Self Care | Payer: Self-pay | Source: Ambulatory Visit | Attending: Family Medicine | Admitting: Family Medicine

## 2010-12-23 DIAGNOSIS — R109 Unspecified abdominal pain: Secondary | ICD-10-CM

## 2010-12-23 DIAGNOSIS — N949 Unspecified condition associated with female genital organs and menstrual cycle: Secondary | ICD-10-CM | POA: Insufficient documentation

## 2010-12-29 ENCOUNTER — Ambulatory Visit (INDEPENDENT_AMBULATORY_CARE_PROVIDER_SITE_OTHER): Payer: Self-pay | Admitting: Family Medicine

## 2010-12-29 ENCOUNTER — Encounter: Payer: Self-pay | Admitting: Family Medicine

## 2010-12-29 VITALS — BP 114/77 | HR 67 | Temp 98.0°F | Ht 58.5 in | Wt 218.0 lb

## 2010-12-29 DIAGNOSIS — N898 Other specified noninflammatory disorders of vagina: Secondary | ICD-10-CM | POA: Insufficient documentation

## 2010-12-29 DIAGNOSIS — R109 Unspecified abdominal pain: Secondary | ICD-10-CM

## 2010-12-29 LAB — POCT WET PREP (WET MOUNT): Trichomonas Wet Prep HPF POC: NEGATIVE

## 2010-12-29 NOTE — Assessment & Plan Note (Addendum)
Discharge associated with odor and itch.  Will check wet prep--not revealing.  Asked to stop douching.  Will send to GYN.

## 2010-12-29 NOTE — Progress Notes (Signed)
  Subjective:    Patient ID: [name], female    DOB: 12/11/1975, 35 y.o.   MRN: [id number]  HPI Pt here for f/u of pelvic pain- she had her period since she was here last.  This was painful but better with motrin.  However, she says that her pelvic pain continues and she has trouble tolerating any pressure on her abd.  She would like evaluation by GYN.   D/c- pt notes copious d/c that is foul smelling and itchy.  She has tried a vinegar douche as a home remedy which she says helped some.  Now it is back x 4 days.   Review of Systems    Denies CP, SOB, HA, N/V/D, fever  Objective:   Physical Exam Vital signs reviewed General appearance - alert, well appearing, and in no distress and oriented to person, place, and time GYN- not performed as done at last visit.  Swab collected for wet prep.       Assessment & Plan:

## 2010-12-29 NOTE — Patient Instructions (Signed)
Marylu Lund (case worker) will get your appt info [phone]

## 2010-12-29 NOTE — Assessment & Plan Note (Signed)
MRI and [place] normal.  Pelvic pain not associated with periods.  Will send for eval by GYN.

## 2011-01-27 NOTE — Progress Notes (Signed)
Addended by: Jone Baseman D on: 01/27/2011 03:08 PM   Modules accepted: Orders

## 2011-02-08 ENCOUNTER — Encounter: Payer: Self-pay | Admitting: Family Medicine

## 2011-02-08 ENCOUNTER — Ambulatory Visit (INDEPENDENT_AMBULATORY_CARE_PROVIDER_SITE_OTHER): Payer: Self-pay | Admitting: Family Medicine

## 2011-02-08 VITALS — BP 120/76 | HR 90 | Temp 98.1°F | Ht 58.5 in | Wt 218.0 lb

## 2011-02-08 DIAGNOSIS — R109 Unspecified abdominal pain: Secondary | ICD-10-CM

## 2011-02-08 NOTE — Progress Notes (Signed)
  Subjective:    Patient ID: [name], female    DOB: [birth year], 35 y.o.   MRN: [id number]  HPI Patient came in today to followup on her pelvic pain. She never heard about an appointment with her gynecologist. She is still having the same symptoms. We discussed the next step would be to go to gynecologist. She did not get any better with the Diflucan. She is slightly better with vinegar douches.  Review of Systems     Objective:   Physical Exam        Assessment & Plan:  Plan is to call GYN tomorrow morning. Received a number to call her at tell her the information for the appointment. Patient was instructed to call here if she did not hear from [place] tomorrow.

## 2011-02-08 NOTE — Assessment & Plan Note (Signed)
Plan to be seen by gynecology

## 2011-02-09 ENCOUNTER — Other Ambulatory Visit: Payer: Self-pay | Admitting: Family Medicine

## 2011-02-09 DIAGNOSIS — R102 Pelvic and perineal pain: Secondary | ICD-10-CM

## 2011-02-10 ENCOUNTER — Telehealth: Payer: Self-pay | Admitting: Family Medicine

## 2011-02-10 NOTE — Telephone Encounter (Signed)
Is calling back to find out about her appointment with her OB/Gyn.

## 2011-02-10 NOTE — Telephone Encounter (Signed)
Will forward to [employer] to call pt back  Marines,  Unfortunately [facility] GYN clinic is very backed up, They are unable to schedule anyone until Mid to late January.  Can you please relay this message to patient?

## 2011-02-11 ENCOUNTER — Telehealth (HOSPITAL_COMMUNITY): Payer: Self-pay | Admitting: Family Medicine

## 2011-02-11 NOTE — Telephone Encounter (Signed)
I called and let VM. Pt will call us back.  [employer]

## 2011-04-13 ENCOUNTER — Other Ambulatory Visit: Payer: Self-pay | Admitting: Family Medicine

## 2011-05-19 ENCOUNTER — Encounter: Payer: Self-pay | Admitting: Obstetrics & Gynecology

## 2011-05-19 ENCOUNTER — Telehealth: Payer: Self-pay | Admitting: Family Medicine

## 2011-05-19 DIAGNOSIS — R102 Pelvic and perineal pain: Secondary | ICD-10-CM

## 2011-05-19 NOTE — Telephone Encounter (Signed)
Called and spoke with [facility].  They are showing that the referral was placed in the wrong workqueue.  Will do another referral and place in WOC. Dayzee Trower, Maryjo Rochester

## 2011-05-19 NOTE — Telephone Encounter (Signed)
Addended by: Jone Baseman D on: 05/19/2011 11:07 AM   Modules accepted: Orders

## 2011-05-19 NOTE — Telephone Encounter (Signed)
Pt needs to know referral status for gynecology.  [employer]

## 2011-05-23 ENCOUNTER — Telehealth: Payer: Self-pay | Admitting: Family Medicine

## 2011-05-23 NOTE — Telephone Encounter (Signed)
Hey [employer],  Her appt is 06/29/11 @ 1:45am.  Do you think you can write her letter in spanish and send it to her?

## 2011-05-23 NOTE — Telephone Encounter (Signed)
I called pt but phone number is disconnected

## 2011-05-24 ENCOUNTER — Telehealth: Payer: Self-pay | Admitting: Family Medicine

## 2011-05-24 ENCOUNTER — Encounter: Payer: Self-pay | Admitting: Family Medicine

## 2011-05-24 NOTE — Telephone Encounter (Signed)
[name] called pt again and any phone number that  We have in the system is working Meza typed and Meza sent it.  [name]

## 2011-05-25 ENCOUNTER — Other Ambulatory Visit: Payer: Self-pay | Admitting: Family Medicine

## 2011-05-25 MED ORDER — GLYBURIDE 5 MG PO TABS: 10.0000 mg | ORAL_TABLET | Freq: Every day | ORAL | Status: DC

## 2011-05-26 ENCOUNTER — Encounter: Payer: Self-pay | Admitting: Family Medicine

## 2011-05-26 ENCOUNTER — Other Ambulatory Visit: Payer: Self-pay | Admitting: Family Medicine

## 2011-05-26 ENCOUNTER — Ambulatory Visit (INDEPENDENT_AMBULATORY_CARE_PROVIDER_SITE_OTHER): Payer: Self-pay | Admitting: Family Medicine

## 2011-05-26 VITALS — BP 150/94 | HR 101 | Temp 98.2°F | Ht 58.5 in | Wt 231.0 lb

## 2011-05-26 DIAGNOSIS — R1084 Generalized abdominal pain: Secondary | ICD-10-CM

## 2011-05-26 DIAGNOSIS — I10 Essential (primary) hypertension: Secondary | ICD-10-CM

## 2011-05-26 DIAGNOSIS — F320 Major depressive disorder, single episode, mild: Secondary | ICD-10-CM

## 2011-05-26 DIAGNOSIS — E119 Type 2 diabetes mellitus without complications: Secondary | ICD-10-CM

## 2011-05-26 DIAGNOSIS — S93409A Sprain of unspecified ligament of unspecified ankle, initial encounter: Secondary | ICD-10-CM | POA: Insufficient documentation

## 2011-05-26 DIAGNOSIS — F32A Depression, unspecified: Secondary | ICD-10-CM | POA: Insufficient documentation

## 2011-05-26 DIAGNOSIS — F329 Major depressive disorder, single episode, unspecified: Secondary | ICD-10-CM | POA: Insufficient documentation

## 2011-05-26 DIAGNOSIS — R635 Abnormal weight gain: Secondary | ICD-10-CM

## 2011-05-26 LAB — COMPREHENSIVE METABOLIC PANEL
ALT: 31 U/L (ref 0–35)
AST: 32 U/L (ref 0–37)
Albumin: 4.1 g/dL (ref 3.5–5.2)
Alkaline Phosphatase: 98 U/L (ref 39–117)
BUN: 16 mg/dL (ref 6–23)
Calcium: 9 mg/dL (ref 8.4–10.5)
Chloride: 104 mEq/L (ref 96–112)
Potassium: 3.8 mEq/L (ref 3.5–5.3)
Sodium: 138 mEq/L (ref 135–145)
Total Protein: 6.9 g/dL (ref 6.0–8.3)

## 2011-05-26 LAB — POCT GLYCOSYLATED HEMOGLOBIN (HGB A1C): Hemoglobin A1C: 7.3

## 2011-05-26 LAB — LDL CHOLESTEROL, DIRECT: Direct LDL: 103 mg/dL — ABNORMAL HIGH

## 2011-05-26 MED ORDER — LISINOPRIL 10 MG PO TABS: 10.0000 mg | ORAL_TABLET | Freq: Every day | ORAL | Status: DC

## 2011-05-26 MED ORDER — GLYBURIDE 5 MG PO TABS: 10.0000 mg | ORAL_TABLET | Freq: Every day | ORAL | Status: DC

## 2011-05-26 MED ORDER — METFORMIN HCL 1000 MG PO TABS: 1000.0000 mg | ORAL_TABLET | Freq: Two times a day (BID) | ORAL | Status: DC

## 2011-05-26 MED ORDER — CITALOPRAM HYDROBROMIDE 20 MG PO TABS: 20.0000 mg | ORAL_TABLET | Freq: Every day | ORAL | Status: DC

## 2011-05-26 NOTE — Assessment & Plan Note (Signed)
Mild ankle sprain. Gave Ace wrap.

## 2011-05-26 NOTE — Progress Notes (Signed)
  Subjective:    Patient ID: [name], female    DOB: 04/19/1975, 36 y.o.   MRN: [id number]  HPI  Diabetes-patient is taking glyburide/metformin with no problems. She has not checked her blood sugars at home. She noticed that she's been gaining weight. She reports that she is skipping meals and eating late very frequently. When she eats late she is eating a lot more than usual. She has not been exercising but plans to start a little bit.  Hypertension-patient does not check her blood pressures at home. She denies headaches, shortness of breath, chest pain. She's not on any medications because she stopped her lisinopril several months ago.  Depression-she notes that her mood is significantly down especially when she stays home by herself. She notes that she seems more short tempered with her husband or her children. She occasionally has thoughts of hurting herself. She does not have a plan. She feels safe at home. She feels isolated at home because her neighborhood is not safe and she does not have transportation. She does have help with her translator who is looking for more services for her. She has been using a International aid/development worker but that person thinks that she needs a professional. She is looking for a professional therapist to help her.  Ankle pain-patient was walking quickly yesterday and twisted her ankle slightly. Since then she's had slight pain and swelling in that ankle. She is able to walk on it but it hurts. She is not taking her medication for it  Review of Systems See above    Objective:   Physical Exam Vital signs reviewed General appearance - alert, well appearing, and in no distress and oriented to person, place, and time Heart - normal rate, regular rhythm, normal S1, S2, no murmurs, rubs, clicks or gallops Chest - clear to auscultation, no wheezes, rales or rhonchi, symmetric air entry, no tachypnea, retractions or cyanosis Feet- no worrisome calluses. Good sensation.  Wearing good footwear. Ankle-left medial ankle below the malleolus is slightly swollen. This is also tender to palpation. There is no ecchymosis. She is able to walk. There is no bony tenderness. Psych-Patient appears calm and able to talk about her problems today. She is not tearful.       Assessment & Plan:

## 2011-05-26 NOTE — Assessment & Plan Note (Signed)
BP Readings from Last 3 Encounters:  05/26/11 150/94  02/08/11 120/76  12/29/10 114/77   Blood pressure elevated today. Patient needs lisinopril so we will restart. Recheck blood pressure one week. Recheck BMET 1 week.

## 2011-05-26 NOTE — Patient Instructions (Signed)
Please restart the lisinopril Start the celexa Come back in one week to get checked

## 2011-05-26 NOTE — Assessment & Plan Note (Signed)
Worse. Patient has also gained weight. Continue to drive and metformin. Patient to work on exercise and weight loss.

## 2011-05-26 NOTE — Assessment & Plan Note (Signed)
Patient with score of 14 on pHq 9. She has occasional thoughts of hurting herself but she has no plan. She feels safe at home. She has been to a International aid/development worker and she is looking for a therapist. I will start Celexa today. She will followup in one week

## 2011-05-27 ENCOUNTER — Emergency Department (HOSPITAL_COMMUNITY): Payer: Self-pay

## 2011-05-27 ENCOUNTER — Encounter (HOSPITAL_COMMUNITY): Payer: Self-pay | Admitting: *Deleted

## 2011-05-27 ENCOUNTER — Emergency Department (HOSPITAL_COMMUNITY)
Admission: EM | Admit: 2011-05-27 | Discharge: 2011-05-27 | Disposition: A | Discharge status: Home / Self Care | Payer: Self-pay | Attending: Emergency Medicine | Admitting: Emergency Medicine

## 2011-05-27 DIAGNOSIS — R51 Headache: Secondary | ICD-10-CM | POA: Insufficient documentation

## 2011-05-27 DIAGNOSIS — F329 Major depressive disorder, single episode, unspecified: Secondary | ICD-10-CM | POA: Insufficient documentation

## 2011-05-27 DIAGNOSIS — K219 Gastro-esophageal reflux disease without esophagitis: Secondary | ICD-10-CM | POA: Insufficient documentation

## 2011-05-27 DIAGNOSIS — R509 Fever, unspecified: Secondary | ICD-10-CM | POA: Insufficient documentation

## 2011-05-27 DIAGNOSIS — Z79899 Other long term (current) drug therapy: Secondary | ICD-10-CM | POA: Insufficient documentation

## 2011-05-27 DIAGNOSIS — IMO0001 Reserved for inherently not codable concepts without codable children: Secondary | ICD-10-CM | POA: Insufficient documentation

## 2011-05-27 DIAGNOSIS — R059 Cough, unspecified: Secondary | ICD-10-CM | POA: Insufficient documentation

## 2011-05-27 DIAGNOSIS — J020 Streptococcal pharyngitis: Secondary | ICD-10-CM | POA: Insufficient documentation

## 2011-05-27 DIAGNOSIS — R112 Nausea with vomiting, unspecified: Secondary | ICD-10-CM | POA: Insufficient documentation

## 2011-05-27 DIAGNOSIS — R05 Cough: Secondary | ICD-10-CM | POA: Insufficient documentation

## 2011-05-27 DIAGNOSIS — E119 Type 2 diabetes mellitus without complications: Secondary | ICD-10-CM | POA: Insufficient documentation

## 2011-05-27 DIAGNOSIS — F3289 Other specified depressive episodes: Secondary | ICD-10-CM | POA: Insufficient documentation

## 2011-05-27 LAB — URINALYSIS, ROUTINE W REFLEX MICROSCOPIC
Bilirubin Urine: NEGATIVE
Glucose, UA: NEGATIVE mg/dL
Ketones, ur: 15 mg/dL — AB
Leukocytes, UA: NEGATIVE
Nitrite: NEGATIVE
Protein, ur: 30 mg/dL — AB
Specific Gravity, Urine: 1.014 (ref 1.005–1.030)
Urobilinogen, UA: 0.2 mg/dL (ref 0.0–1.0)
pH: 6 (ref 5.0–8.0)

## 2011-05-27 LAB — POCT I-STAT, CHEM 8
BUN: 6 mg/dL (ref 6–23)
Hemoglobin: 11.2 g/dL — ABNORMAL LOW (ref 12.0–15.0)
Potassium: 3.7 mEq/L (ref 3.5–5.1)
Sodium: 138 mEq/L (ref 135–145)
TCO2: 23 mmol/L (ref 0–100)

## 2011-05-27 LAB — URINE MICROSCOPIC-ADD ON

## 2011-05-27 MED ORDER — HYDROCODONE-ACETAMINOPHEN 7.5-325 MG/15ML PO SOLN: 15.0000 mL | Freq: Four times a day (QID) | ORAL | Status: AC | PRN

## 2011-05-27 MED ORDER — PROMETHAZINE HCL 25 MG PO TABS: 25.0000 mg | ORAL_TABLET | Freq: Four times a day (QID) | ORAL | Status: DC | PRN

## 2011-05-27 MED ORDER — SODIUM CHLORIDE 0.9 % IV BOLUS (SEPSIS)
1000.0000 mL | Freq: Once | INTRAVENOUS | Status: AC
  Administered 2011-05-27: 1000 mL via INTRAVENOUS

## 2011-05-27 MED ORDER — MORPHINE SULFATE 4 MG/ML IJ SOLN
4.0000 mg | Freq: Once | INTRAMUSCULAR | Status: AC
  Administered 2011-05-27: 4 mg via INTRAVENOUS
  Filled 2011-05-27: qty 1

## 2011-05-27 MED ORDER — ONDANSETRON HCL 4 MG/2ML IJ SOLN
4.0000 mg | Freq: Once | INTRAMUSCULAR | Status: AC
  Administered 2011-05-27: 4 mg via INTRAVENOUS
  Filled 2011-05-27: qty 2

## 2011-05-27 MED ORDER — PENICILLIN G BENZATHINE 1200000 UNIT/2ML IM SUSP
1.2000 10*6.[IU] | INTRAMUSCULAR | Status: AC
  Administered 2011-05-27: 1.2 10*6.[IU] via INTRAMUSCULAR
  Filled 2011-05-27: qty 2

## 2011-05-27 MED ORDER — DEXAMETHASONE SODIUM PHOSPHATE 10 MG/ML IJ SOLN
10.0000 mg | Freq: Once | INTRAMUSCULAR | Status: AC
  Administered 2011-05-27: 10 mg via INTRAVENOUS
  Filled 2011-05-27: qty 1

## 2011-05-27 NOTE — Discharge Instructions (Signed)
Please review the instructions below. Your strep test tonight was positive. We have treated you here with an injection of penicillin. We have also given you a steroid in your IV to help with the swelling in your tonsils. You have been given fluids, medication for pain and nausea and admit to feeling some better. We are prescribing a short course of medication for pain and nausea. Please take as directed. Continue to alternate Tylenol and ibuprofen for fever and bodyaches. Review the instructions below regarding salt water gargles. Keep your scheduled appointment with Dr. Hulen Luster at [facility] on 06/08/2011 as planned. Return here for worsening symptoms.   Revise por favor las instrucciones abajo. Su prueba de la inflamacin de garganta esta noche fue positiva. Le hemos tratado aqu con una inyeccin de penicilina. Nosotros tambin le hemos dado un esteroide en su IV a ayudar con la hinchazn en las amgdalas. Ha sido dado lquidos, la medicina para el dolor y la nusea y confiesa sentirse Therapist, music. Prescribimos un curso corto de medicina para Chief Technology Officer y la nusea. Contine alternar Tylenol e ibuprofeno para la fiebre y bodyaches. Tome por favor como dirigido. Revise las instrucciones abajo con respecto a Technical sales engineer. Mantenga su cita planificada con Dr. Hulen Luster en [facility] on 06/08/2011. Regrese aqu para empeorarse sntomas.   Grgaras de agua con sal (Salt Water Gargle) Esta solucin har que sienta alivio en la boca y la garganta. INSTRUCCIONES PARA EL CUIDADO DOMICILIARIO  Mezcle 1 cucharadita de sal en 8 onzas de agua tibia.   Haga grgaras con esta solucin con la frecuencia que necesite o segn le hayan indicado. Hgalo suavemente si tiene lesiones o lastimaduras en la boca.   No trague esta solucin.  Document Released: 06/16/2008 Document Revised: 02/17/2011 [facility] Patient Information 2012 [place], [place].Angina por estreptococos (Strep Throat)  La  angina estreptocccica es una infeccin en la garganta causada por una bacteria llamada Streptococcus pyogenes. El mdico la llamar "amigdalitis" o "faringitis" estreptocccica, segn haya signos de inflamacin en las amgdalas o en la zona posterior de la garganta. Es ms frecuente en nios entre los 5 y los 15 aos durante los meses de fro, Biomedical engineer puede ocurrir a Dealer de Actuary. La infeccin se transmite de persona a persona (contagio) a travs de la tos, el estornudo u otro contacto cercano.  SNTOMAS  Fiebre o escalofros.   La garganta o las [employer] duelen y estn inflamadas.   Dolor o dificultad para tragar.   Puntos blancos o amarillos en las amgdalas o la garganta.   Ganglios linfticos hinchados a los lados del cuello o debajo de la [place].   Erupcin rojiza en todo el cuerpo (poco comn).  DIAGNSTICO Diferentes infecciones pueden causar los mismos sntomas. Para confirmar el diagnstico deber [employer], y as podrn prescribirle el tratamiento adecuado. La "prueba rpida de estreptococo" ayudar al mdico a hacer el diagnstico en algunos minutos. Si no se dispone de la prueba, se har un rpido hisopado de la zona afectada para hacer un cultivo de las secreciones de la garganta. Si se hace un cultivo, los resultados estarn disponibles en [employer].  TRATAMIENTO El estreptococo de garganta se trata con antibiticos. INSTRUCCIONES PARA EL CUIDADO DOMICILIARIO  Haga grgaras con 1 cucharadita de sal en 1 taza de agua tibia, 3  4 veces por da o cuando lo crea necesario para sentirse mejor.   Los [employer] de la familia que tambin Teaching laboratory technician en la  garganta deben ser evaluados y tratados con antibiticos si tienen la infeccin.   Asegrese de que todas las personas de su casa se laven [employer].   No comparta alimentos, tazas ni utensilios personales que puedan contagiar la infeccin.   Coma alimentos blandos hasta que el dolor de garganta  mejore.   Beba gran cantidad de lquido para mantener la orina de tono claro o color amarillo plido. Esto ayudar a Agricultural engineer.   Debe hacer reposo.   No concurra a la escuela o la trabajo hasta que haya tomado los antibiticos durante 24 horas.   Utilice los medicamentos de venta libre o de prescripcin para Chief Technology Officer, Environmental health practitioner o la [place], segn se lo indique el profesional que lo asiste.   Si le han recetado medicamentos, tmelos segn las indicaciones. Finalice la prescripcin completa, aunque se sienta mejor.  SOLICITE ATENCIN MDICA SI:  Los ganglios del cuello siguen agrandados.   Aparece una erupcin cutnea, tos o dolor de odos.   Tiene un catarro verde, amarillo amarronado o esputo sanguinolento.   Siente dolor o Dentist que no puede controlar con los medicamentos.   Los sntomas parecen empeorar en vez de mejorar.  SOLICITE ATENCIN MDICA DE INMEDIATO SI:  Presenta algn nuevo sntoma, como vmitos, dolor de cabeza intenso, rigidez o dolor en el cuello, dolor en el pecho o problemas respiratorios o dificultad para tragar.   Presenta dolor de garganta intenso, babeo o cambios en la voz.   Siente que el cuello se hincha o la piel de esa zona se vuelve roja y sensible.   Tiene fiebre.   Tiene signos de deshidratacin, como fatiga, boca seca y disminucin de la [place].   Comienza a sentir mucho sueo, o no puede despertarse bien.  Document Released: 12/08/2004 Document Revised: 02/17/2011 [facility] Patient Information 2012 [place], [place].Amigdalitis  (Tonsillitis)  Las amgdalas son bultos de tejido linftico que se encuentran el la zona posterior de la garganta. Cada amgdala tiene 20 grietas (criptas). Ayudan a Industrial/product designer las infecciones de la nariz y la garganta y a Automotive engineer que las infecciones se diseminen a otras zonas del [place], [place] los primeros 18 meses de vida. La amigdalitis es una infeccin de la garganta que hace que las amgdalas  se tornen rojas, sensibles e hinchadas. CAUSAS  La causa de la amigdalitis sbita(aguda), y con tratamiento temporaria, es una infeccin por la bacteria estreptococo. La amigdalitis de larga duracin (crnica) se produce cuando las grietas de las amigdalas se llenan con trozos de alimentos y bacterias, que favorece las infecciones constantes.  SNTOMAS  Los sntomas son:   Dolor de Advertising copywriter.   Placas blancas sobre la amgdala.   Grant Ruts.   Cansancio.  DIAGNSTICO  El diagnstico puede hacerse a travs de un examen fsico. Se confirma con los resultados de las pruebas de laboratorio, incluyendo un cultivo de secreciones de Administrator.  TRATAMIENTO  Los objetivos del tratamiento son la reduccin de la gravedad y duracin de los sntomas, prevencin de enfermedades asociadas y prevencin del contagio de la enfermedad. La amigdalitis bacteriana se puede tratar con antibiticos. Generalmente,el tratamiento con antibiticos comienza antes de conocerse la causa. Sin embargo, si se determina que la causa no es bacteriana, los antibiticos no podrn Barista. Si los ataques de amigdalitis son graves y frecuentes, Administrator la ciruga para extirpar las amgdalas (amigdalectoma).  INSTRUCCIONES PARA EL CUIDADO EN EL HOGAR   Descanse y duerma todo lo posible.  Beba lquido en abundancia. Mientras le duela la garganta, consuma alimentos blandos o lquidos, como sorbetes, sopas, o bebidas instantneas.   Tome helados de agua.   Los nios [employer] o los adultos pueden hacerse grgaras con lquidos tibios o fros para Personal assistant. Mezcle 1 cucharadita de sal en 1 taza de agua.   Los [employer] de la familia que presenten dolor de garganta o fiebre deben concurrir para un examen mdico o realizarse un cultivo de las secreciones de la garganta.   Solo tome medicamentos que se pueden comprar sin receta o recetados para Chief Technology Officer, Dentist o fiebre, como le indica el  mdico.   Si le han recetado antibiticos, tmelos segn las indicaciones. Tmelos todos, aunque se sienta mejor.  SOLICITE ATENCIN MDICA SI:   El beb tiene ms de 3 meses y su temperatura rectal es de 100.5 F (38.1 C) o ms durante ms de 1 da.   Le aparecen bultos grandes y dolorosos en el cuello.   Tiene una erupcin.   Elimina un esputo verde, marrn-amarillento o sanguinolento.   No puede tragar lquidos o alimentos durante 24 horas.   El nio no puede tragar lquidos o alimentos durante 12 horas.  SOLICITE ATENCIN MDICA DE INMEDIATO SI:   Presenta algn nuevo sntoma, como vmitos, dolor de odos, dolor de cabeza intenso, rigidez o dolor en el cuello, dolor en el pecho o problemas respiratorios o dificultad para tragar.   Comienza a Financial risk analyst de garganta ms intenso junto con babeo o cambios en la voz.   Siente un dolor intenso, que no se alivia con los [employer] han prescripto.   No puede abrir completamene la boca.   Siente un dolor intenso, hinchazn o enrojecimiento en el cuello.   Tiene fiebre.   Su beb tiene ms de 3 meses y su temperatura rectal es de 102 F (38.9 C) o ms.   Su beb tiene 3 meses o menos y su temperatura rectal es de 100.4 F (38 C) o ms.  ASEGRESE DE QUE:   Comprende estas instrucciones.   Controlar su enfermedad.   Solicitar ayuda de inmediato si no mejora o empeora.  Document Released: 12/08/2004 Document Revised: 02/17/2011 [facility] Patient Information 2012 [place], [place].Amigdalitis  (Tonsillitis)  Las amgdalas son bultos de tejido linftico que se encuentran el la zona posterior de la garganta. Cada amgdala tiene 20 grietas (criptas). Ayudan a Industrial/product designer las infecciones de la nariz y la garganta y a Automotive engineer que las infecciones se diseminen a otras zonas del [place], [place] los primeros 18 meses de vida. La amigdalitis es una infeccin de la garganta que hace que las amgdalas se tornen rojas, sensibles  e hinchadas. CAUSAS  La causa de la amigdalitis sbita(aguda), y con tratamiento temporaria, es una infeccin por la bacteria estreptococo. La amigdalitis de larga duracin (crnica) se produce cuando las grietas de las amigdalas se llenan con trozos de alimentos y bacterias, que favorece las infecciones constantes.  SNTOMAS  Los sntomas son:   Dolor de Advertising copywriter.   Placas blancas sobre la amgdala.   Grant Ruts.   Cansancio.  DIAGNSTICO  El diagnstico puede hacerse a travs de un examen fsico. Se confirma con los resultados de las pruebas de laboratorio, incluyendo un cultivo de secreciones de Administrator.  TRATAMIENTO  Los objetivos del tratamiento son la reduccin de la gravedad y duracin de los sntomas, prevencin de enfermedades asociadas y prevencin del contagio de la enfermedad. La amigdalitis bacteriana se puede tratar  con antibiticos. Generalmente,el tratamiento con antibiticos comienza antes de conocerse la causa. Sin embargo, si se determina que la causa no es bacteriana, los antibiticos no podrn Barista. Si los ataques de amigdalitis son graves y frecuentes, Administrator la ciruga para extirpar las amgdalas (amigdalectoma).  INSTRUCCIONES PARA EL CUIDADO EN EL HOGAR   Descanse y duerma todo lo posible.   Beba lquido en abundancia. Mientras le duela la garganta, consuma alimentos blandos o lquidos, como sorbetes, sopas, o bebidas instantneas.   Tome helados de agua.   Los nios [employer] o los adultos pueden hacerse grgaras con lquidos tibios o fros para Personal assistant. Mezcle 1 cucharadita de sal en 1 taza de agua.   Los [employer] de la familia que presenten dolor de garganta o fiebre deben concurrir para un examen mdico o realizarse un cultivo de las secreciones de la garganta.   Solo tome medicamentos que se pueden comprar sin receta o recetados para Chief Technology Officer, Dentist o fiebre, como le indica el mdico.   Si le han recetado  antibiticos, tmelos segn las indicaciones. Tmelos todos, aunque se sienta mejor.  SOLICITE ATENCIN MDICA SI:   El beb tiene ms de 3 meses y su temperatura rectal es de 100.5 F (38.1 C) o ms durante ms de 1 da.   Le aparecen bultos grandes y dolorosos en el cuello.   Tiene una erupcin.   Elimina un esputo verde, marrn-amarillento o sanguinolento.   No puede tragar lquidos o alimentos durante 24 horas.   El nio no puede tragar lquidos o alimentos durante 12 horas.  SOLICITE ATENCIN MDICA DE INMEDIATO SI:   Presenta algn nuevo sntoma, como vmitos, dolor de odos, dolor de cabeza intenso, rigidez o dolor en el cuello, dolor en el pecho o problemas respiratorios o dificultad para tragar.   Comienza a Financial risk analyst de garganta ms intenso junto con babeo o cambios en la voz.   Siente un dolor intenso, que no se alivia con los [employer] han prescripto.   No puede abrir completamene la boca.   Siente un dolor intenso, hinchazn o enrojecimiento en el cuello.   Tiene fiebre.   Su beb tiene ms de 3 meses y su temperatura rectal es de 102 F (38.9 C) o ms.   Su beb tiene 3 meses o menos y su temperatura rectal es de 100.4 F (38 C) o ms.  ASEGRESE DE QUE:   Comprende estas instrucciones.   Controlar su enfermedad.   Solicitar ayuda de inmediato si no mejora o empeora.  Document Released: 12/08/2004 Document Revised: 02/17/2011 [facility] Patient Information 2012 [place], [place].

## 2011-05-27 NOTE — ED Provider Notes (Signed)
History     CSN: [id number]  Arrival date & time 05/27/11  0865   First MD Initiated Contact with Patient 05/27/11 2103      Chief Complaint  Patient presents with  . Sore Throat     Patient is a 36 y.o. female presenting with pharyngitis.  Sore Throat This is a new problem. The current episode started in the past 7 days. The problem occurs constantly. The problem has been gradually worsening. Associated symptoms include chills, coughing, a fever, headaches, myalgias, nausea and vomiting. Pertinent negatives include no abdominal pain, change in bowel habit, congestion or rash. The symptoms are aggravated by swallowing. She has tried acetaminophen and NSAIDs for the symptoms. The treatment provided no relief.   patient reports a two-day history of worsening sore throat, fever, body aches, occasional cough, headache and 2 episodes of nausea and vomiting today. Admits to history of frequent tonsillitis. Denies any ill contacts. Pain and symptoms have not responded to Tylenol and ibuprofen. Past Medical History  Diagnosis Date  . Depression   . Diabetes mellitus   . GERD (gastroesophageal reflux disease)     Past Surgical History  Procedure Date  . Tubal ligation     Family History  Problem Relation Age of Onset  . Hypertension Mother   . Diabetes Father   . Heart disease Father     History  Substance Use Topics  . Smoking status: Never Smoker   . Smokeless tobacco: Never Used  . Alcohol Use: No    OB History    Grav Para Term Preterm Abortions TAB SAB Ect Mult Living                  Review of Systems  Constitutional: Positive for fever and chills.  HENT: Negative for congestion.   Eyes: Negative.   Respiratory: Positive for cough.   Cardiovascular: Negative.   Gastrointestinal: Positive for nausea and vomiting. Negative for abdominal pain and change in bowel habit.  Genitourinary: Negative.   Musculoskeletal: Positive for myalgias.  Skin: Negative.  Negative  for rash.  Neurological: Positive for headaches.  Hematological: Negative.   Psychiatric/Behavioral: Negative.     Allergies  Review of patient's allergies indicates no known allergies.  Home Medications   Current Outpatient Rx  Name Route Sig Dispense Refill  . CITALOPRAM HYDROBROMIDE 20 MG PO TABS Oral Take 20 mg by mouth daily.    . GLYBURIDE 5 MG PO TABS Oral Take 10 mg by mouth daily with breakfast.    . LISINOPRIL 10 MG PO TABS Oral Take 10 mg by mouth daily.    Marland Kitchen METFORMIN HCL 1000 MG PO TABS Oral Take 1,000 mg by mouth 2 (two) times daily with a meal.    . FLUCONAZOLE 150 MG PO TABS Oral Take 150 mg by mouth once. Take one now and again in 3 days      BP 108/60  Pulse 100  Temp(Src) 100 F (37.8 C) (Oral)  Resp 22  SpO2 99%  LMP 05/13/2011  Physical Exam  Constitutional: She is oriented to person, place, and time. She appears well-developed and well-nourished.  HENT:  Head: Normocephalic and atraumatic.  Right Ear: Hearing, tympanic membrane, external ear and ear canal normal.  Left Ear: Hearing, tympanic membrane, external ear and ear canal normal.  Nose: Nose normal.  Mouth/Throat: Uvula is midline. Posterior oropharyngeal erythema present. No oropharyngeal exudate or tonsillar abscesses.       Bilateral tonsils enlarged, 2-3+ bilaterally, mild erythema,  unable to visualize any exudate.  Eyes: Conjunctivae are normal.  Neck: Neck supple.  Cardiovascular: Normal rate and regular rhythm.   Pulmonary/Chest: Effort normal and breath sounds normal.  Abdominal: Soft. Bowel sounds are normal.  Musculoskeletal: Normal range of motion.  Neurological: She is alert and oriented to person, place, and time.  Skin: Skin is warm and dry. No erythema.  Psychiatric: She has a normal mood and affect.    ED Course  Procedures   Patient reports feeling some better after IV fluids and medication. Findings and clinical impression discussed with patient via interpreter phone. I  have discussed the chest x-ray with Dr. Radford Pax. Will treat for strep pharyngitis and encourage follow up with her primary care physician on 06/08/2011 as planned. Patient to return to ED for worsening symptoms. Patient verbalizes understanding of instructions.    Labs Reviewed  RAPID STREP SCREEN - Abnormal; Notable for the following:    Streptococcus, Group A Screen (Direct) POSITIVE (*)    All other components within normal limits  POCT I-STAT, CHEM 8 - Abnormal; Notable for the following:    Glucose, Bld 157 (*)    Hemoglobin 11.2 (*)    HCT 33.0 (*)    All other components within normal limits  URINALYSIS, ROUTINE W REFLEX MICROSCOPIC   No results found.   No diagnosis found.    MDM  HPI/PE and clinical findings c/w 1. Strep pharyngitis        Leanne Chang, NP 05/27/11 2257

## 2011-05-27 NOTE — ED Notes (Signed)
sorethroat vomiting with a sl temp since yesterday

## 2011-05-28 NOTE — ED Provider Notes (Signed)
Medical screening examination/treatment/procedure(s) were performed by non-physician practitioner and as supervising physician I was immediately available for consultation/collaboration.    Nelia Shi, MD 05/28/11 [phone]

## 2011-05-30 ENCOUNTER — Telehealth: Payer: Self-pay | Admitting: Family Medicine

## 2011-05-30 NOTE — Telephone Encounter (Signed)
Please call pt to let her know that labs were normal

## 2011-06-01 ENCOUNTER — Telehealth: Payer: Self-pay | Admitting: Family Medicine

## 2011-06-01 NOTE — Telephone Encounter (Signed)
I called Pt but phone is out of service. [employer]

## 2011-06-03 ENCOUNTER — Telehealth: Payer: Self-pay | Admitting: Family Medicine

## 2011-06-03 NOTE — Telephone Encounter (Signed)
Dr. Hulen Luster I been calling Pt many  time and phone number is not working I send a letter to Pt. I hope pt contact us soon and we will update pt's inf in EPIC. [employer]

## 2011-06-08 ENCOUNTER — Ambulatory Visit: Payer: Self-pay | Admitting: Family Medicine

## 2011-06-23 ENCOUNTER — Ambulatory Visit: Payer: Self-pay

## 2011-06-29 ENCOUNTER — Telehealth: Payer: Self-pay | Admitting: *Deleted

## 2011-06-29 ENCOUNTER — Encounter: Payer: Self-pay | Admitting: Obstetrics & Gynecology

## 2011-06-29 ENCOUNTER — Ambulatory Visit (INDEPENDENT_AMBULATORY_CARE_PROVIDER_SITE_OTHER): Payer: Self-pay | Admitting: Obstetrics & Gynecology

## 2011-06-29 VITALS — BP 128/75 | HR 66 | Temp 97.3°F | Resp 20 | Ht 58.5 in | Wt 228.4 lb

## 2011-06-29 DIAGNOSIS — N898 Other specified noninflammatory disorders of vagina: Secondary | ICD-10-CM

## 2011-06-29 DIAGNOSIS — IMO0002 Reserved for concepts with insufficient information to code with codable children: Secondary | ICD-10-CM

## 2011-06-29 DIAGNOSIS — N941 Unspecified dyspareunia: Secondary | ICD-10-CM

## 2011-06-29 LAB — POCT URINALYSIS DIP (DEVICE)
Ketones, ur: NEGATIVE mg/dL
Protein, ur: NEGATIVE mg/dL
Specific Gravity, Urine: 1.02 (ref 1.005–1.030)
pH: 6 (ref 5.0–8.0)

## 2011-06-29 MED ORDER — DICLOFENAC SODIUM 75 MG PO TBEC: 75.0000 mg | DELAYED_RELEASE_TABLET | Freq: Two times a day (BID) | ORAL | Status: DC

## 2011-06-29 NOTE — Telephone Encounter (Addendum)
Called patient mobile number with interpreter and left message we are calling to notify her that her prescription has been called in, please call with any questions Message copied by [facility], Samanthamarie Ezzell L on Wed Jun 29, 2011  3:56 PM ------      Message from: Jaynie Collins A      Created: Wed Jun 29, 2011  3:36 PM      Regarding: Call patient        Diclofenac ER Rx faxed in via Epic to [facility] pharmacy.  Please call to verify they got the RX and call patient to pick up medication. Spanish speaking only.            Gracias!            UAA

## 2011-06-29 NOTE — Telephone Encounter (Addendum)
Called [facility] pharmacy- they do not eprescribe, order given by phone .  Message copied by Gerome Apley on Wed Jun 29, 2011  3:45 PM ------      Message from: Jaynie Collins A      Created: Wed Jun 29, 2011  3:36 PM      Regarding: Call patient        Diclofenac ER Rx faxed in via Epic to [facility] pharmacy.  Please call to verify they got the RX and call patient to pick up medication. Spanish speaking only.            Gracias!            UAA

## 2011-06-29 NOTE — Patient Instructions (Addendum)
Dispareunia  (Dyspareunia) La dispareunia es el dolor durante las relaciones sexuales. Es ms comn en las mujeres, pero tambin ocurre [employer].  CAUSAS  Femenino El dolor se siente cuando algo penetra en la vagina, pero cualquier parte de los genitales pueden causar dolor durante las relaciones sexuales. Puede sentir dolor incluso al sentarse o usar pantalones. En algunos casos no se conoce la causa. Algunas causas:   Infecciones de la piel que rodea la vagina.   Las infecciones vaginales, tales como hongos, bacterias o infeccin viral.   Vaginismo. Es la imposibilidad de Warehouse manager algo en la vagina, incluso queriendo [place]. Hay una contraccin muscular automtica y Engineer, mining. El dolor de la contraccin muscular puede ser tan intenso que el coito es imposible.   Reaccin alrgica a los espermicidas, semen, preservativos, tampones perfumados, jabones, duchas y [employer].   Un saco lleno de lquido (quiste) en las glndulas de Cornel[place] o de [place], que se encuentra en la apertura de la vagina.   Tejido cicatrizal en la vagina por un agrandamiento quirrgico de la abertura (episiotoma) o desgarro despus de Warehouse manager un beb.   Sequedad vaginal. Esto es ms frecuente en la menopausia. Las secreciones normales de la vagina estn disminuidas. Los [employer] niveles de estrgeno y la mayor dificultad para excitarse pueden causar dolor durante el sexo. La sequedad vaginal tambin puede ocurrir cuando se toman pldoras anticonceptivas.   Adelgazamiento del tejido (atrofia) de la vulva y la vagina. Esto hace que la zona sea ms delgada, ms pequea, incapaz de estirarse para acomodar el pene, y propensa a infecciones y Insurance account manager.   Vestibulitis vulvar o vestibulodinia. Esta es una enfermedad que causa dolor y afecta el rea alrededor de la entrada de la vagina. La causa ms comn en las mujeres jvenes son las pldoras anticonceptivas. Las mujeres con niveles bajos de estrgenos (mujeres  posmenopusicas) tambin pueden experimentarlo.Otras causas incluyen reacciones alrgicas, gran cantidad de terminaciones nerviosas, afecciones de la piel y msculos plvicos que no pueden relajarse.   Dermatosis vulvar. Esto incluye enfermedades de la piel como el liquen escleroso y liquen plano.   Falta de juegos previos para la lubricacin de la vagina. Esto puede causar sequedad vaginal.   Los tumores no cancerosos (fibromas) en el tero.   El tejido que reviste internamente al tero se desarrolla fuera del mismo (endometriosis).   Embarazo que comienza en la trompa de [place] (embarazo tubrico).   El embarazo o estar amamantando al beb. Esto puede causar sequedad vaginal.   Inclinacin o prolapso del tero. El prolapso se produce cuando los msculos dbiles y estirados alrededor del tero dejan que caiga en la vagina.   Problemas en los ovarios, quistes o cicatrices. Esto puede ser peor con ciertas posiciones sexuales.   Cirugas previas causando adherencias o tejido cicatrizal en la vagina o la pelvis.   Problemas en la vejiga e intestinales.   Problemas psicolgicos (como depresin o ansiedad). Esto puede hacer que el dolor empeore.   Actitudes negativas con respecto al sexo, haber sufrido una violacin, agresin sexual, y Warehouse manager. Estos problemas suelen estar relacionados con algunos tipos de dolor.   Infeccin plvica previa, causando un tejido cicatrizal en la pelvis y en los rganos femeninos.   Quiste o tumor en el ovario.   Cncer en los rganos femeninos.   Ciertos medicamentos.   Enfermedades como diabetes, artritis, o enfermedad de la tiroides.  DIAGNSTICO   El mdico har la historia clnica y tendr que  describir Immunologist donde se Optometrist (fuera de la vagina, en la vagina, en la pelvis). Le preguntar en qu momento experimenta el dolor, durante la penetracin o con los choques.   Luego el [employer] har un examen fsico.  Hgale saber si sufre dolor durante el examen.   Durante la parte final del examen femenino, su mdico le palpar el tero y los ovarios con la mano sobre el abdomen y un dedo en la vagina. Es un examen plvico.   Le pedir anlisis de sangre, un Papan[place], cultivos en busca de infecciones, una ecografa y radiografas. Es posible que necesite ver a un especialista por problemas femeninos (gineclogo).   Su mdico indicar que se haga una tomografa computada, una resonancia magntica o una laparoscopia. La laparoscopia es un procedimiento para examinar la pelvis con un tubo con luz, a travs de un corte (incisin) en el abdomen.  TRATAMIENTO  Su mdico determinar el mejor curso de [place]. En algunos casos se solicitan ms pruebas. Contine con los estudios indicados hasta que el mdico est seguro acerca del diagnstico y [place]. A veces, es difcil de Veterinary surgeon causa del dolor. La bsqueda de la causa y el tratamiento pueden ser frustrantes. El tratamiento generalmente demora varias semanas o meses antes de notar [place]. Tambin puede ser necesario evitar la actividad sexual hasta que los sntomas mejoren. Si sigue teniendo Clinical research associate cuando tiene dolor puede Engineer, manufacturing la curacin y Diplomatic Services operational officer.  El tratamiento depende de la causa del dolor. Puede incluir:   Medicamentos como antibiticos, cremas vaginales, para la piel, hormonas, o antidepresivos.   Ciruga mayor o menor.   El asesoramiento psicolgico o terapia de [place].   Ejercicios de Kegel y dilatadores vaginales para ayudar en ciertos casos de vaginismo (espasmos). Hgalos slo si se lo recomienda su mdico. Los ejercicios Kegel pueden hacer que algunos problemas empeoren.   La aplicacin de un lubricante segn las indicaciones del mdico si tiene sequedad.   Terapia sexual para usted y su compaero.  Es comn que el dolor contine despus de tratar la causa. Puede ser por Neomia Dear respuesta  condicionada. Esto significa la persona se vuelve tan familiar con el dolor que persiste como [place], aunque se elimine la causa. La terapia sexual puede ayudar con este problema.  INSTRUCCIONES PARA EL CUIDADO EN EL HOGAR   Siga las instrucciones de su mdico acerca de como [employer], las [place], terapias y visitas de control.   No use tampones, duchas vaginales , aerosoles vaginales o jabones perfumados.   Use lubricantes a base de agua para la sequedad. Los lubricantes con aceites pueden causar irritacin.   No utilice espermicidas o condones que Ecologist.   Hable abiertamente con su pareja sobre su experiencia sexual , sus deseos, el juego previo y las diferentes posiciones sexuales para Neomia Dear relacin sexual ms cmoda y Geographical information systems officer.   nase a sesiones de [place] de [place], si es necesario.   Practique sexo seguro en todo momento.   Vace su vejiga antes de Management consultant.   Pruebe diferentes posiciones durante el coito.   Tome medicamentos de venta libre para Chief Technology Officer segn las indicaciones del mdico, antes de Management consultant.   No use panties. Use las medias que llegan a la altura de la rodilla o del muslo.   Evite frotar la vulva con un pao. Lave suavemente la zona y squela con una toalla dando golpecitos.  SOLICITE ATENCIN MDICA SI:  Tiene hemorragias durante las [employer].   Observa un bulto en la abertura de la vagina, aunque no sea doloroso.   Tiene flujo vaginal anormal.   Tiene sequedad vaginal.   Siente tiene picazn o irritacin de la vulva o la vagina.   Aparece una erupcin o tiene una reaccin a los medicamentos.  SOLICITE ATENCIN MDICA DE INMEDIATO SI:   Siente dolor abdominal intenso durante o poco despus de las [employer]. Usted podra haber sufrido la ruptura de un quiste ovrico o un embarazo ectpico.   Tiene fiebre.   Comienza a Financial risk analyst al orinar u [employer].   Tiene  relaciones sexuales dolorosas, y Forensic scientist ocurri antes.   Se desmaya despus de Management consultant.  Document Released: 11/10/2010 Document Revised: 02/17/2011 [facility] Patient Information 2012 [place], [place].

## 2011-06-29 NOTE — Progress Notes (Signed)
History:  36 y.o. [id number] here today for chronic "pelvic pain."  The translator phone was used. She reports that she has had this pelvic pain for the last 12 years and radiates around to her right flank/lumbar region intermittently. She describes the pain as "stabbing" on the inside.The pain is the worst with sex but is also aggravated by walking. The pain is constant but in intensity fluctuates with activity. Pain 8/10.   She is also having some "burning" with urination and brown/yellow vaginal mucus that has a fishy odor. She typically has regular periods, but in march she reports having a heavy period from the 3/1 until 3/12; with spotting until 3/15. She believes she has a UTI  She was recently diagnosed with HTN, DM.   Surgeries: BTL   FmHx: mother - DM; Father - HTN  SoHx: She stays at home with her children.   The following portions of the patient's history were reviewed and updated as appropriate: allergies, current medications, past family history, past medical history, past social history, past surgical history and problem list.  Review of Systems:  A comprehensive review of systems was negative except for: Genitourinary: positive for abnormal menstrual periods, vaginal discharge and burning with urination.   Objective:  Physical Exam Blood pressure 128/75, pulse 66, temperature 97.3 F (36.3 C), temperature source Oral, resp. rate 20, height 4' 10.5" (1.486 m), weight 103.602 kg (228 lb 6.4 oz), last menstrual period 05/13/2011. Gen: NAD Abd: Soft, obese, non distended, tender to palpation in the right lower quadrant and periumbilical area. No masses felt. Overlying skin normal.  Pelvic: Normal appearing external genitalia; normal appearing vaginal mucosa and cervix.  Normal discharge; samples obtained for cultures.  Tenderness in right lower quadrant of abdomen upon examination of vagina past the introitus, no discernable specific location for the pain. Pain not in vagina, but  referred to abdomen. Small uterus, no palpable masses or adnexal tenderness.  Assessment & Plan:   Pelvic Pain  - Follow up with OB/GYN post [place] - Diclofenac Rx given - UA negative   Orders Placed This Encounter  Procedures  . Wet prep, genital  . US Pelvis Complete    Standing Status: Future     Number of Occurrences:      Standing Expiration Date: 08/28/2012    Order Specific Question:  Reason for exam:    Answer:  Dyspareunia for several years    Order Specific Question:  Preferred imaging location?    Answer:  [facility]  . US Transvaginal Non-OB    Standing Status: Future     Number of Occurrences:      Standing Expiration Date: 08/28/2012    Order Specific Question:  Reason for exam:    Answer:  Dyspareunia for several years    Order Specific Question:  Preferred imaging location?    Answer:  [facility]  . GC/chlamydia probe amp, genital  . POCT urinalysis dip (device)     Agree with above note.  Jaynie Collins, M.D. 06/29/2011 3:41 PM

## 2011-06-30 LAB — WET PREP, GENITAL: WBC, Wet Prep HPF POC: NONE SEEN

## 2011-06-30 LAB — GC/CHLAMYDIA PROBE AMP, GENITAL: Chlamydia, DNA Probe: NEGATIVE

## 2011-06-30 NOTE — Telephone Encounter (Signed)
Called patient home and mobile, no answer and unable to leave message.

## 2011-07-05 ENCOUNTER — Telehealth: Payer: Self-pay | Admitting: *Deleted

## 2011-07-05 NOTE — Telephone Encounter (Signed)
Pt left message in Spanish stating that she is returning our call regarding her Rx.  Hospital Interpreter "Trinna Post" assisted w/pt call.  I returned the pt call w/Alex and informed her that the Rx has been called to the [facility] pharmacy and should be ready for pick up. Pt voiced understanding.

## 2011-07-06 ENCOUNTER — Ambulatory Visit (HOSPITAL_COMMUNITY): Payer: Self-pay

## 2011-07-07 NOTE — Telephone Encounter (Signed)
Called patient with interpreter Delorise Royals, verified she had gotten message and picked up prescription.

## 2011-07-13 ENCOUNTER — Ambulatory Visit (INDEPENDENT_AMBULATORY_CARE_PROVIDER_SITE_OTHER): Payer: Self-pay | Admitting: Family Medicine

## 2011-07-13 ENCOUNTER — Encounter: Payer: Self-pay | Admitting: Family Medicine

## 2011-07-13 VITALS — BP 120/85 | Temp 97.4°F | Ht 58.5 in | Wt 229.0 lb

## 2011-07-13 DIAGNOSIS — J029 Acute pharyngitis, unspecified: Secondary | ICD-10-CM

## 2011-07-13 LAB — POCT RAPID STREP A (OFFICE): Rapid Strep A Screen: NEGATIVE

## 2011-07-13 MED ORDER — CITALOPRAM HYDROBROMIDE 20 MG PO TABS: 20.0000 mg | ORAL_TABLET | Freq: Every day | ORAL | Status: DC

## 2011-07-13 MED ORDER — METFORMIN HCL 1000 MG PO TABS: 1000.0000 mg | ORAL_TABLET | Freq: Two times a day (BID) | ORAL | Status: DC

## 2011-07-13 MED ORDER — FLUTICASONE PROPIONATE 50 MCG/ACT NA SUSP: 2.0000 | Freq: Every day | NASAL | Status: DC

## 2011-07-13 MED ORDER — AMOXICILLIN-POT CLAVULANATE 875-125 MG PO TABS: 1.0000 | ORAL_TABLET | Freq: Two times a day (BID) | ORAL | Status: AC

## 2011-07-13 MED ORDER — LISINOPRIL 10 MG PO TABS: 10.0000 mg | ORAL_TABLET | Freq: Every day | ORAL | Status: DC

## 2011-07-13 MED ORDER — GLYBURIDE 5 MG PO TABS: 10.0000 mg | ORAL_TABLET | Freq: Every day | ORAL | Status: DC

## 2011-07-13 NOTE — Patient Instructions (Signed)
Rinitis Alrgica (Allergic Rhinitis) La rinitis alrgica aparece cuando las membranas mucosas de la nariz reaccionan a los alrgenos. Los alrgenos son las partculas que estn en el aire y a las que el organismo responde cuando existe una reaccin alrgica. Esto hace que usted libere anticuerpos de alergia. A travs de una sucesin de procesos, finalmente se libera histamina (de ah el uso de antihistamnicos) en el torrente sanguneo. Aunque esto implica una proteccin para su organismo, es lo que le produce las molestias., como estornudos frecuentes, congestin, picazn y goteos de la nariz.  CAUSAS Los alergenos del polen pueden provenir del csped, rboles y hierbas. Esto produce la rinitis alrgica estacional, o "fiebre de heno". Otras alrgenos pueden ocasionar rinitis alrgica persistente (rinitis alrgica perenne) como aquellos que contienen los caros del polvo del hogar, el pelaje de las mascotas y las esporas del moho.  SNTOMAS  Congestin nasal.   Picazn y goteo de la nariz con estornudos y lagrimeo de los ojos.   Generalmente, tambin puede haber picazn de la boca, ojos y odos.  Las alergias no pueden curarse pero pueden controlarse con medicamentos. DIAGNSTICO Si no reconoce exactamente cul es el alrgeno que le ocasiona el problema, podrn realizarle pruebas de sangre, o de piel para determinarlo. TRATAMIENTO  Evite el alrgeno.   Podrn ser tiles medicamentos y vacunas para la alergia (inmunoterapia).   Con frecuencia la fiebre de heno se trata simplemente con antihistamnicos en forma de pldoras o sprays nasales. Los antihistamnicos bloquean los efectos de la histamina. Existen medicamentos de venta libre que lo ayudarn a aliviar la picazn, la congestin nasal y la hinchazn alrededor de los ojos. Consulte con el profesional antes de tomar o administrar estos medicamentos.  Si estos medicamentos no le resultan efectivos, existen muchos otros nuevos que el  profesional que lo asiste puede prescribirle. Si las medidas iniciales no son efectivas, podrn utilizarse medicamentos ms fuertes. Las inyecciones desensibilizantes pueden utilizarse si los otros medicamentos fracasan. La desensibilizacin aparece cuando un paciente recibe inyecciones continuas hasta que el cuerpo se vuelve menos sensible al alrgeno. Asegrese de realizar un seguimiento con el profesional que lo asiste si los problemas continan. SOLICITE ANTENCIN MDICA SI:   Le sube la temperatura a ms de 100.5 F (38.1 C).   Presenta tos que no se alivia (persistente).   Le falta el aire.   Comienza a respirar con dificultad.   Los sntomas interfieren con las actividades diarias.  Document Released: 12/08/2004 Document Revised: 02/17/2011 [employer] Patient Information 2012 [employer]. 

## 2011-07-13 NOTE — Progress Notes (Signed)
  Subjective:    Patient ID: [name], female    DOB: Nov 25, 1975, 36 y.o.   MRN: [id number]  HPI 78-year-old female Spanish-speaking only here with translator here with complaint of sore throat. Duration: 2 days Character constant burning sensation Sick contacts no Travel history no New medication no  What she has tried at home: Has been using some home remedies at home such as tea which have helped somewhat.   Review of Systems Associated symptoms include some chills Patient denies fevers, nausea, vomiting, diarrhea or constipation. Patient also denies rash    Objective:   Physical Exam Vitals reviewed General: No apparent distress HEENT: Tympanic membranes nonbulging non-retracted no erythema turbinates are edematous and enlarged with erythema bilaterally patient does have a positive postnasal drip and some mild anterior lymphadenopathy Cardiovascular: Regular rate and rhythm no murmur Pulmonary: Clear to auscultation with some upper airway transmitted noises. Abdomen: Bowel sounds positive nontender Skin: No rash, patient does have a bruise on forearm from a dog bite skin does not show any signs of erythema no puncture wounds noted.    Assessment & Plan:

## 2011-07-13 NOTE — Assessment & Plan Note (Signed)
Patient has history of sore throat approximately this time last year as well. Patient was positive for strep throat it appears in the chart back this fall. Patient does have signs of show a potential allergic rhinitis as well. At this time we will treat with Flonaseand we'll give a wait-and-see medication in case patient worsens.

## 2011-07-20 ENCOUNTER — Ambulatory Visit (HOSPITAL_COMMUNITY): Payer: Self-pay

## 2011-07-22 ENCOUNTER — Ambulatory Visit (HOSPITAL_COMMUNITY)
Admission: RE | Admit: 2011-07-22 | Discharge: 2011-07-22 | Disposition: A | Discharge status: Home / Self Care | Payer: Self-pay | Source: Ambulatory Visit | Attending: Obstetrics & Gynecology | Admitting: Obstetrics & Gynecology

## 2011-07-22 DIAGNOSIS — N941 Unspecified dyspareunia: Secondary | ICD-10-CM

## 2011-07-22 DIAGNOSIS — IMO0002 Reserved for concepts with insufficient information to code with codable children: Secondary | ICD-10-CM | POA: Insufficient documentation

## 2011-08-01 ENCOUNTER — Emergency Department (HOSPITAL_COMMUNITY): Payer: Self-pay

## 2011-08-01 ENCOUNTER — Encounter (HOSPITAL_COMMUNITY): Payer: Self-pay | Admitting: Emergency Medicine

## 2011-08-01 ENCOUNTER — Emergency Department (HOSPITAL_COMMUNITY)
Admission: EM | Admit: 2011-08-01 | Discharge: 2011-08-02 | Disposition: A | Discharge status: Home / Self Care | Payer: Self-pay | Attending: Emergency Medicine | Admitting: Emergency Medicine

## 2011-08-01 DIAGNOSIS — R1013 Epigastric pain: Secondary | ICD-10-CM | POA: Insufficient documentation

## 2011-08-01 DIAGNOSIS — Z79899 Other long term (current) drug therapy: Secondary | ICD-10-CM | POA: Insufficient documentation

## 2011-08-01 DIAGNOSIS — I10 Essential (primary) hypertension: Secondary | ICD-10-CM | POA: Insufficient documentation

## 2011-08-01 DIAGNOSIS — M79609 Pain in unspecified limb: Secondary | ICD-10-CM | POA: Insufficient documentation

## 2011-08-01 DIAGNOSIS — E119 Type 2 diabetes mellitus without complications: Secondary | ICD-10-CM | POA: Insufficient documentation

## 2011-08-01 DIAGNOSIS — R079 Chest pain, unspecified: Secondary | ICD-10-CM | POA: Insufficient documentation

## 2011-08-01 DIAGNOSIS — R209 Unspecified disturbances of skin sensation: Secondary | ICD-10-CM | POA: Insufficient documentation

## 2011-08-01 DIAGNOSIS — R61 Generalized hyperhidrosis: Secondary | ICD-10-CM | POA: Insufficient documentation

## 2011-08-01 DIAGNOSIS — R42 Dizziness and giddiness: Secondary | ICD-10-CM | POA: Insufficient documentation

## 2011-08-01 DIAGNOSIS — R110 Nausea: Secondary | ICD-10-CM | POA: Insufficient documentation

## 2011-08-01 HISTORY — DX: Essential (primary) hypertension: I10

## 2011-08-01 LAB — BASIC METABOLIC PANEL
BUN: 15 mg/dL (ref 6–23)
Creatinine, Ser: 0.51 mg/dL (ref 0.50–1.10)
GFR calc Af Amer: >90 mL/min (ref >90)
GFR calc non Af Amer: >90 mL/min (ref >90)
Potassium: 4 mEq/L (ref 3.5–5.1)

## 2011-08-01 LAB — DIFFERENTIAL
Basophils Absolute: 0.1 10*3/uL (ref 0.0–0.1)
Eosinophils Absolute: 0.2 10*3/uL (ref 0.0–0.7)
Lymphocytes Relative: 27 % (ref 12–46)
Neutro Abs: 5.2 10*3/uL (ref 1.7–7.7)
Neutrophils Relative %: 65 % (ref 43–77)

## 2011-08-01 LAB — CBC
HCT: 33.9 % — ABNORMAL LOW (ref 36.0–46.0)
MCHC: 32.2 g/dL (ref 30.0–36.0)
Platelets: 234 10*3/uL (ref 150–400)
RDW: 14.9 % (ref 11.5–15.5)

## 2011-08-01 MED ORDER — ASPIRIN 81 MG PO CHEW
324.0000 mg | CHEWABLE_TABLET | Freq: Once | ORAL | Status: AC
  Administered 2011-08-02: 324 mg via ORAL
  Filled 2011-08-01: qty 4

## 2011-08-01 MED ORDER — GI COCKTAIL ~~LOC~~
30.0000 mL | Freq: Once | ORAL | Status: AC
  Administered 2011-08-02: 30 mL via ORAL
  Filled 2011-08-01: qty 30

## 2011-08-01 NOTE — ED Provider Notes (Signed)
History     CSN: [id number]  Arrival date & time 08/01/11  2217   First MD Initiated Contact with Patient 08/01/11 2337      Chief Complaint  Patient presents with  . Chest Pain    (Consider location/radiation/quality/duration/timing/severity/associated sxs/prior treatment) HPI Comments: 36 year old female with a history of diabetes who presents with onset of chest pain around 6:30 in the evening, this has been persistent, pressure, radiates to the arm and feels numbness around her lips. She also complains of an epigastric pain. She denies any shortness of breath, coughing but does feel like this came on suddenly and was associated with lightheadedness and sweating and nausea. Currently her feeling of pressure in her chest is approximately 7/10 in intensity. She had no medications prior to arrival. She is a diabetic who has been using her medications appropriately.  The history is provided by the patient. The history is limited by a language barrier. A language interpreter was used.    Past Medical History  Diagnosis Date  . Hypertension   . Diabetes mellitus     Past Surgical History  Procedure Date  . Cesarean section   . Tubal ligation     No family history on file.  History  Substance Use Topics  . Smoking status: Never Smoker   . Smokeless tobacco: Not on file  . Alcohol Use: No    OB History    Grav Para Term Preterm Abortions TAB SAB Ect Mult Living                  Review of Systems  All other systems reviewed and are negative.    Allergies  Review of patient's allergies indicates no known allergies.  Home Medications   Current Outpatient Rx  Name Route Sig Dispense Refill  . GLYBURIDE 5 MG PO TABS Oral Take 5 mg by mouth 2 (two) times daily with a meal.    . METFORMIN HCL 500 MG PO TABS Oral Take 500 mg by mouth 2 (two) times daily with a meal.    . FAMOTIDINE 20 MG PO TABS Oral Take 1 tablet (20 mg total) by mouth 2 (two) times daily. 30 tablet 0     BP 113/64  Pulse 69  Temp(Src) 97.7 F (36.5 C) (Oral)  Resp 16  SpO2 100%  LMP 06/29/2011  Physical Exam  Nursing note and vitals reviewed. Constitutional: She appears well-developed and well-nourished. No distress.  HENT:  Head: Normocephalic and atraumatic.  Mouth/Throat: Oropharynx is clear and moist. No oropharyngeal exudate.  Eyes: Conjunctivae and EOM are normal. Pupils are equal, round, and reactive to light. Right eye exhibits no discharge. Left eye exhibits no discharge. No scleral icterus.  Neck: Normal range of motion. Neck supple. No JVD present. No thyromegaly present.  Cardiovascular: Normal rate, regular rhythm, normal heart sounds and intact distal pulses.  Exam reveals no gallop and no friction rub.   No murmur heard. Pulmonary/Chest: Effort normal and breath sounds normal. No respiratory distress. She has no wheezes. She has no rales. She exhibits tenderness ( Reproducible tenderness to palpation in the mid chest).  Abdominal: Soft. Bowel sounds are normal. She exhibits no distension and no mass. There is tenderness (Epigastric tenderness that the patient states reproduces her pain. There is mild guarding in the epigastrium, no other abdominal tenderness).  Musculoskeletal: Normal range of motion. She exhibits no edema and no tenderness.  Lymphadenopathy:    She has no cervical adenopathy.  Neurological: She is alert.  Coordination normal.  Skin: Skin is warm and dry. No rash noted. No erythema.  Psychiatric: She has a normal mood and affect. Her behavior is normal.    ED Course  Procedures (including critical care time)  ED ECG REPORT   Date: 08/02/2011   Rate: 83  Rhythm: normal sinus rhythm  QRS Axis: normal  Intervals: normal  ST/T Wave abnormalities: normal  Conduction Disutrbances:none  Narrative Interpretation:   Old EKG Reviewed: none available   Labs Reviewed  CBC - Abnormal; Notable for the following:    Hemoglobin 10.9 (*)    HCT 33.9  (*)    MCV 69.3 (*)    MCH 22.3 (*)    All other components within normal limits  BASIC METABOLIC PANEL - Abnormal; Notable for the following:    Glucose, Bld 188 (*)    All other components within normal limits  COMPREHENSIVE METABOLIC PANEL - Abnormal; Notable for the following:    Glucose, Bld 189 (*)    AST 47 (*)    ALT 43 (*)    All other components within normal limits  URINALYSIS, ROUTINE W REFLEX MICROSCOPIC - Abnormal; Notable for the following:    APPearance CLOUDY (*)    Specific Gravity, Urine 1.034 (*)    Hgb urine dipstick TRACE (*)    Protein, ur 100 (*)    Leukocytes, UA MODERATE (*)    All other components within normal limits  URINE MICROSCOPIC-ADD ON - Abnormal; Notable for the following:    Squamous Epithelial / LPF FEW (*)    Bacteria, UA MANY (*)    All other components within normal limits  DIFFERENTIAL  POCT I-STAT TROPONIN I  LIPASE, BLOOD   Dg Chest 2 View  08/01/2011  *RADIOLOGY REPORT*  Clinical Data: Right-sided chest pain.  Shortness of breath. Generalized weakness.  CHEST - 2 VIEW  Comparison: None.  Findings: Suboptimal inspiration due to body habitus which accounts for crowded bronchovascular markings at the bases and accentuates the cardiac silhouette.  Taking this into account, cardiomediastinal silhouette unremarkable and lungs clear.  No pleural effusions.  Visualized bony thorax intact.  IMPRESSION: Suboptimal inspiration.  No acute cardiopulmonary disease.  Original Report Authenticated By: Arnell Sieving, M.D.     1. Chest pain       MDM  The patient has normal vital signs, normal EKG with no signs of ischemia, physical exam consistent with an epigastric pain and a chest wall pain. She does have her stackers for heart disease given her diabetes, will proceed with troponin, labs, lipase, GI cocktail, aspirin.  Repeat EKG, see below  ED ECG REPORT   Date: 08/02/2011   Rate: 74  Rhythm: normal sinus rhythm  QRS Axis: normal   Intervals: normal  ST/T Wave abnormalities: normal  Conduction Disutrbances:none  Narrative Interpretation:   Old EKG Reviewed: unchanged   Patient has had significant improvement after GI cocktail, on repeat exam still has mild tenderness in the epigastrium, labs otherwise negative, troponin negative, patient amenable to discharge on antihistamine H2 blocker, [facility] with [facility] where she is already an established patient.      Vida Roller, MD 08/02/11 0120

## 2011-08-01 NOTE — ED Notes (Signed)
PT. REPORTS MID CHEST PAIN WITH SLIGHT SOB AND NAUSEA ONSET  THIS EVENING , ALSO REPORTS ELEVATED BLOOD SUGAR 400 TODAY .

## 2011-08-02 ENCOUNTER — Encounter: Payer: Self-pay | Admitting: Family Medicine

## 2011-08-02 ENCOUNTER — Ambulatory Visit (INDEPENDENT_AMBULATORY_CARE_PROVIDER_SITE_OTHER): Payer: Self-pay | Admitting: Family Medicine

## 2011-08-02 VITALS — BP 101/71 | HR 80 | Temp 98.0°F | Ht 58.5 in | Wt 227.0 lb

## 2011-08-02 DIAGNOSIS — B49 Unspecified mycosis: Secondary | ICD-10-CM

## 2011-08-02 DIAGNOSIS — B379 Candidiasis, unspecified: Secondary | ICD-10-CM | POA: Insufficient documentation

## 2011-08-02 DIAGNOSIS — R300 Dysuria: Secondary | ICD-10-CM

## 2011-08-02 DIAGNOSIS — E119 Type 2 diabetes mellitus without complications: Secondary | ICD-10-CM

## 2011-08-02 DIAGNOSIS — M549 Dorsalgia, unspecified: Secondary | ICD-10-CM

## 2011-08-02 LAB — POCT URINALYSIS DIPSTICK
Leukocytes, UA: NEGATIVE
Nitrite, UA: NEGATIVE
Protein, UA: 100
Urobilinogen, UA: 0.2
pH, UA: 5.5

## 2011-08-02 LAB — URINE MICROSCOPIC-ADD ON

## 2011-08-02 LAB — URINALYSIS, ROUTINE W REFLEX MICROSCOPIC
Nitrite: NEGATIVE
Specific Gravity, Urine: 1.034 — ABNORMAL HIGH (ref 1.005–1.030)
Urobilinogen, UA: 0.2 mg/dL (ref 0.0–1.0)

## 2011-08-02 LAB — COMPREHENSIVE METABOLIC PANEL
CO2: 25 mEq/L (ref 19–32)
Calcium: 9.5 mg/dL (ref 8.4–10.5)
Creatinine, Ser: 0.52 mg/dL (ref 0.50–1.10)
GFR calc Af Amer: >90 mL/min (ref >90)
GFR calc non Af Amer: >90 mL/min (ref >90)
Glucose, Bld: 189 mg/dL — ABNORMAL HIGH (ref 70–99)

## 2011-08-02 LAB — LIPASE, BLOOD: Lipase: 34 U/L (ref 11–59)

## 2011-08-02 LAB — POCT UA - MICROSCOPIC ONLY

## 2011-08-02 MED ORDER — GLYBURIDE 5 MG PO TABS: 10.0000 mg | ORAL_TABLET | Freq: Two times a day (BID) | ORAL | Status: DC

## 2011-08-02 MED ORDER — FLUCONAZOLE 150 MG PO TABS: ORAL_TABLET | ORAL | Status: DC

## 2011-08-02 MED ORDER — FAMOTIDINE 20 MG PO TABS: 20.0000 mg | ORAL_TABLET | Freq: Two times a day (BID) | ORAL | Status: DC

## 2011-08-02 MED ORDER — GLYBURIDE 5 MG PO TABS: 10.0000 mg | ORAL_TABLET | Freq: Every day | ORAL | Status: DC

## 2011-08-02 NOTE — Discharge Instructions (Signed)
Return to the hospital for severe or worsening pain or vomiting or difficulty breathing, take the medication every night as prescribed, see your Dr. this week for a recheck.

## 2011-08-02 NOTE — Patient Instructions (Addendum)
Please take diflucan for yeast  We need to work on your diabetes Take your glyburide 2 pills twice a day Come back in one month   Dolor de espalda, adultos   (Back Pain, Adult)  El dolor de espalda es frecuente. Con frecuencia mejora luego de algn tiempo. La causa suele no ser un peligro para la vida. La mayora de las personas aprende a controlarlo por sus propios medios.   CUIDADOS EN EL HOGAR    Mantngase fsicamente activo. Si puede, comience a dar cortas caminatas en un suelo plano. Trate de caminar un poco ms cada da.   Nopermanezca sentado, de pie ni conduzca automviles durante ms de 30 minutos seguidos. Nose quede en la cama.   Noevite los ejercicios ni el trabajo. La actividad puede ayudar a que la espalda se cure ms rpido.   Tenga cuidado al inclinarse o al levantar un objeto. Doble las rodillas, mantenga el [place] cerca de su cuerpo y no gire.   Duerma sobre un [employer] Acustese sobre un lado y doble las rodillas. Si se [employer], coloque una almohada debajo de las rodillas.   Tome la medicacin slo como le haya indicado el mdico.   Aplique hielo sobre la zona lesionada.   Ponga el hielo en una bolsa plstica.   Colquese una toalla entre la piel y la bolsa de hielo.   Deje la bolsa de hielo durante 15 a 3 a 4 veces por da, durante los primeros 2 o 3 das. Luego puede ir alternando entre hielo y compresas calientes.   Consulte a su mdico sobre cul ejercicios o IT sales professional.   Evite sentirse ansioso o estresado. Encuentre la forma de enfrentar el estrs, como por Lexicographer ejercicios.  SOLICITE AYUDA DE INMEDIATO SI:    El dolor no desaparece aunque haga reposo o tome medicamentos para Chief Technology Officer.   El dolor no desaparece en una semana.   Tiene nuevos problemas.   No se siente mejor.   El dolor se extiende a las piernas.   No puede controlar la orina o la materia fecal.   Siente que los brazos estn  dbiles o pierde la sensibilidad (estn adormecidos).   Tiene Programme researcher, broadcasting/film/video (nuseas) y vmitos.   Siente dolor abdominal.   Siente que se desvanece (se desmaya).  ASEGRESE DE QUE:    Comprende estas instrucciones.   Controlar su enfermedad.   Solicitar ayuda de inmediato si no mejora o si empeora.  Document Released: 09/13/2010 Document Revised: 02/17/2011 [facility] Patient Information 2012 [place], [place].

## 2011-08-03 ENCOUNTER — Encounter: Payer: Self-pay | Admitting: Family Medicine

## 2011-08-03 NOTE — Assessment & Plan Note (Signed)
Chronic intermittent back pain. Likely due to strain. Gave instructions for modifying exercise as well as working on abdominal muscles.

## 2011-08-03 NOTE — Assessment & Plan Note (Signed)
Lab Results  Component Value Date   HGBA1C 7.3 05/26/2011    The last A1c was good, however her recent blood sugars have been high. We'll increase glyburide to 10 mg twice a day. Return for evaluation one month.

## 2011-08-03 NOTE — Progress Notes (Signed)
  Subjective:    Patient ID: [name], female    DOB: 1975/10/24, 36 y.o.   MRN: [id number]  HPI Dysuria-patient has been having some burning with urination as well as some leaking for about one month. She's been seen by GYN for evaluation. At that time they treated her for yeast. They also sent her for ultrasound, which was normal. She thinks she initially got a little bit better but now she is having the same symptoms.  Lumbar strain-patient has been increasing her activity and has noticed pain across the lower back. She started Zumba class with her friend and think that this makes a little bit worse. She's been trying to moderate her activity based on her pain.  Diabetes-patient has been taking her glyburide 10 mg in the morning as well as her metformin 1000 mg twice a day. She's been checking her sugar and they have been high to the 400s. She has not had any lows. She struggles to watch her diet and often eats carb heavy meals  Nonsmoker  Review of Systems Denies CP, SOB, HA, N/V/D, fever    Objective:   Physical Exam Vital signs reviewed General appearance - alert, well appearing, and in no distress Heart - normal rate, regular rhythm, normal S1, S2, no murmurs, rubs, clicks or gallops Chest - clear to auscultation, no wheezes, rales or rhonchi, symmetric air entry, no tachypnea, retractions or cyanosis Abdomen - soft, nontender, nondistended, no masses or organomegaly MSK-back with full range of motion. Tenderness is on the paraspinous muscles of the lumbar back. There is no bony tenderness. She has full strength of the lower extremity with normal sensation.       Assessment & Plan:

## 2011-08-03 NOTE — Assessment & Plan Note (Signed)
Urine does not show any sign of infection but does show yeast. I will send the urine for culture and treat for these infection.

## 2011-08-04 LAB — URINE CULTURE: Colony Count: 50000

## 2011-08-24 ENCOUNTER — Emergency Department (INDEPENDENT_AMBULATORY_CARE_PROVIDER_SITE_OTHER)
Admission: EM | Admit: 2011-08-24 | Discharge: 2011-08-24 | Disposition: A | Discharge status: Home / Self Care | Payer: Self-pay | Source: Home / Self Care | Attending: Family Medicine | Admitting: Family Medicine

## 2011-08-24 ENCOUNTER — Encounter (HOSPITAL_COMMUNITY): Payer: Self-pay | Admitting: *Deleted

## 2011-08-24 DIAGNOSIS — N390 Urinary tract infection, site not specified: Secondary | ICD-10-CM

## 2011-08-24 LAB — POCT URINALYSIS DIP (DEVICE)
Protein, ur: >=300 mg/dL — AB
Specific Gravity, Urine: 1.025 (ref 1.005–1.030)
Urobilinogen, UA: 1 mg/dL (ref 0.0–1.0)

## 2011-08-24 MED ORDER — PHENAZOPYRIDINE HCL 100 MG PO TABS: 100.0000 mg | ORAL_TABLET | Freq: Three times a day (TID) | ORAL | Status: DC | PRN

## 2011-08-24 MED ORDER — LIDOCAINE HCL (PF) 1 % IJ SOLN
INTRAMUSCULAR | Status: AC
  Filled 2011-08-24: qty 5

## 2011-08-24 MED ORDER — HYDROCODONE-ACETAMINOPHEN 5-500 MG PO TABS: 1.0000 | ORAL_TABLET | Freq: Four times a day (QID) | ORAL | Status: AC | PRN

## 2011-08-24 MED ORDER — CEPHALEXIN 500 MG PO CAPS: 500.0000 mg | ORAL_CAPSULE | Freq: Three times a day (TID) | ORAL | Status: DC

## 2011-08-24 MED ORDER — PHENAZOPYRIDINE HCL 100 MG PO TABS: 100.0000 mg | ORAL_TABLET | Freq: Three times a day (TID) | ORAL | Status: AC | PRN

## 2011-08-24 MED ORDER — CEFTRIAXONE SODIUM 1 G IJ SOLR
INTRAMUSCULAR | Status: AC
  Filled 2011-08-24: qty 10

## 2011-08-24 MED ORDER — CEFTRIAXONE SODIUM 1 G IJ SOLR
1.0000 g | Freq: Once | INTRAMUSCULAR | Status: AC
  Administered 2011-08-24: 1 g via INTRAMUSCULAR

## 2011-08-24 MED ORDER — HYDROCODONE-ACETAMINOPHEN 5-500 MG PO TABS: 1.0000 | ORAL_TABLET | Freq: Four times a day (QID) | ORAL | Status: DC | PRN

## 2011-08-24 MED ORDER — CEPHALEXIN 500 MG PO CAPS: 500.0000 mg | ORAL_CAPSULE | Freq: Three times a day (TID) | ORAL | Status: AC

## 2011-08-24 NOTE — ED Provider Notes (Signed)
History     CSN: [id number]  Arrival date & time 08/24/11  1539   First MD Initiated Contact with Patient 08/24/11 1629      Chief Complaint  Patient presents with  . Urinary Frequency  . Back Pain    (Consider location/radiation/quality/duration/timing/severity/associated sxs/prior treatment) HPI Comments: 36 y/o female with h/o diabetes and htn. Here c/o low abdominal pain, frequency, burning on urination for 3 days. Also reports bilateral low back pain associated with her symptoms. Denies vaginal discharge, fever although reports chills, headache and nausea. No vomiting. Has not taking any medication for her symptoms. temperatute is 99.6 here today.    Past Medical History  Diagnosis Date  . Hypertension   . Diabetes mellitus     Past Surgical History  Procedure Date  . Cesarean section   . Tubal ligation     Family History  Problem Relation Age of Onset  . Family history unknown: Yes    History  Substance Use Topics  . Smoking status: Never Smoker   . Smokeless tobacco: Not on file  . Alcohol Use: No    OB History    Grav Para Term Preterm Abortions TAB SAB Ect Mult Living   3               Review of Systems  Constitutional: Positive for chills. Negative for fever and diaphoresis.  Cardiovascular: Negative for leg swelling.  Gastrointestinal: Positive for nausea. Negative for vomiting and diarrhea.  Genitourinary: Positive for dysuria and frequency. Negative for hematuria, vaginal bleeding and vaginal discharge.  Skin: Negative for rash.  Neurological: Positive for headaches. Negative for dizziness.    Allergies  Review of patient's allergies indicates no known allergies.  Home Medications   Current Outpatient Rx  Name Route Sig Dispense Refill  . CEPHALEXIN 500 MG PO CAPS Oral Take 1 capsule (500 mg total) by mouth 3 (three) times daily. 21 capsule 0  . FAMOTIDINE 20 MG PO TABS Oral Take 1 tablet (20 mg total) by mouth 2 (two) times daily. 30  tablet 0  . GLYBURIDE 5 MG PO TABS Oral Take 5 mg by mouth 2 (two) times daily with a meal.    . HYDROCODONE-ACETAMINOPHEN 5-500 MG PO TABS Oral Take 1 tablet by mouth every 6 (six) hours as needed for pain. 20 tablet 0  . METFORMIN HCL 500 MG PO TABS Oral Take 500 mg by mouth 2 (two) times daily with a meal.    . PHENAZOPYRIDINE HCL 100 MG PO TABS Oral Take 1 tablet (100 mg total) by mouth 3 (three) times daily as needed for pain. 6 tablet 0    BP 113/80  Pulse 90  Temp 99.6 F (37.6 C) (Oral)  Resp 23  SpO2 98%  LMP 06/29/2011  Physical Exam  Nursing note and vitals reviewed. Constitutional: She is oriented to person, place, and time. She appears well-developed and well-nourished. No distress.  HENT:  Head: Normocephalic and atraumatic.  Eyes: No scleral icterus.  Cardiovascular: Normal heart sounds.   Pulmonary/Chest: Effort normal.  Abdominal: Soft. Bowel sounds are normal. She exhibits no distension and no mass. There is no rebound and no guarding.       Suprapubic tenderness. Otherwise  soft obese abdomen. No CVT.   Neurological: She is alert and oriented to person, place, and time.  Skin: No rash noted.    ED Course  Procedures (including critical care time)  Labs Reviewed  POCT URINALYSIS DIP (DEVICE) - Abnormal; Notable for  the following:    Hgb urine dipstick LARGE (*)     Protein, ur >=300 (*)     Nitrite POSITIVE (*)     Leukocytes, UA LARGE (*)  Biochemical Testing Only. Please order routine urinalysis from main lab if confirmatory testing is needed.   All other components within normal limits  POCT PREGNANCY, URINE  URINE CULTURE   No results found.   1. Urinary tract infection, acute       MDM  36 y/o obese diabetic female with significant low urinary symptoms and pathologic urine. Also reports back pain and nausea. Afebrile. Risk for pyelonephritis discussed with patient. Decided to treat with rocephin 1g IM x1 here. Prescribed Keflex and pyridium.  Patient reports she has appointment with her PCP tomorrow. Asked to [facility] for follow up as scheduled. Or return to the emergency department if fever, vomiting or any worsening symptoms despite treatment. Red flags that should prompt her return discussed in Spanish and also provided in writing.         Sharin Grave, MD 08/25/11 1027

## 2011-08-24 NOTE — Discharge Instructions (Signed)
Usted tiene una infeccion de [place]. empiece a tomar los [employer] he prescrito [employer] he indicado. Mantenga su cita manana con su doctora para seguimiento. Debe ir al departamento de emergencia si sus sintomas empeoran como aumento del dolor, comienza a tener fiebre, nausea o vomitos apesar de Technical brewer.

## 2011-08-24 NOTE — ED Notes (Signed)
Pt reports urinary frequency and back pain since sunday

## 2011-08-25 ENCOUNTER — Ambulatory Visit (INDEPENDENT_AMBULATORY_CARE_PROVIDER_SITE_OTHER): Payer: Self-pay | Admitting: Family Medicine

## 2011-08-25 ENCOUNTER — Encounter: Payer: Self-pay | Admitting: Family Medicine

## 2011-08-25 VITALS — BP 112/79 | HR 80 | Temp 98.1°F | Ht 58.5 in | Wt 225.0 lb

## 2011-08-25 DIAGNOSIS — M549 Dorsalgia, unspecified: Secondary | ICD-10-CM

## 2011-08-25 DIAGNOSIS — N390 Urinary tract infection, site not specified: Secondary | ICD-10-CM | POA: Insufficient documentation

## 2011-08-25 NOTE — Patient Instructions (Signed)
regrese en 1 semana   Distensin lumbo-sacra (Lumbosacral Strain) Distensin lumbo-sacra es una de las causas ms frecuentes de dolor de espalda. Hay numerosas causas que originan el dolor de espalda. Katha Hamming no son enfermedades graves.   CAUSAS La columna vertebral (espina dorsal) est conformada por 24 vrtebras principales, adems del sacro y el cccix que estn unidas entre s por tejidos fibrosos y resistentes denominados ligamentos, y sostenidas por los msculos. Las races [address] pasan a travs de los agujeros [place] vrtebras. Un movimiento brusco o un traumatismo en la espalda pueden ocasionar daos a estos nervios o presin sobre ellos. Esto puede traer [employer] consecuencia un dolor localizado en la espalda o un dolor que se irradia (se desplaza) hacia los glteos y por la pierna hasta el pie. Este trastorno, conocido como citica, est asociado frecuentemente a una ruptura (hernia) del disco. El dolor tambin puede estar causado slo por el espasmo muscular. El mdico a menudo podr Veterinary surgeon causa del dolor a partir de la informacin detallada de los sntomas y un examen fsico. En algunos casos, podr Pension scheme manager estudios (como radiografas), pero podran no ser los adecuados para usted. El profesional que lo asiste determinar si se necesitan otros estudios, segn el examen fsico especfico. INSTUCCIONES PARA EL CUIDADO DOMICILIARIO:  Evite el estilo de vida sedentario. El ejercicio [place], siguiendo las indicaciones del profesional que le asiste, es el arma ms importante con que cuenta para Production manager contra el dolor de espalda.   Si no se encuentra en buen estado fsico, evite las actividades intensas (tenis, frontn, esqu acutico). Esto podra crear o agravar el problema.   Si siente dolor en la espalda, es importante evitar los deportes que requieran de movimientos corporales bruscos. La natacin y las caminatas son las actividades ms seguras.   Mantenga una buena postura.     Evite el sobrepeso (obesidad).   Haga reposo en cama slo en caso de los episodios repentinos ms extremos y agudos. El profesional que lo asiste ayudar a Chief Strategy Officer cunto reposo le conviene.   En los casos agudos, coloque hielo en la zona lesionada.   Ponga el hielo en una bolsa plstica.   Colquese una toalla entre la piel y [place].   Aplique el hielo 15 a 20 minutos por vez, cada dos horas o segn sea necesario.   Una vez que ha mejorado y est ms [place], ser til Corporate treasurer durante 30 minutos antes de las [place].  Consulte con un mdico si el dolor se prolonga ms de lo esperado. El mdico podr ayudar o aconsejarle ejercicios adecuados o terapia si esta es necesaria. Con un buen entrenamiento fsico, podr evitar la mayor parte de los problemas de la espalda.   SOLICITE ATENCIN MDICA DE INMEDIATO SI:  Presenta entumecimiento, hormigueo, debilidad o algn problema en los brazos o las piernas.   Siente un dolor de espalda intenso que no mejora con medicamentos.   Observa cambios en el control del intestino o la vejiga.   Usted presenta dolor en algunas zonas del cuerpo que incluyen el estmago o el abdomen.   Sufre falta de aire, mareos o [place].   Comienza a sentir nuseas (ganas de vomitar), vmitos o sudoracin.   Decoloracin de sus dedos o piernas o pies que se ponen muy fros.   El dolor de [place].   Tiene fiebre.  EST SEGURO QUE:    Comprende las instrucciones para el alta mdica.   Controlar su enfermedad.   Solicitar atencin  mdica de inmediato segn las indicaciones.  Document Released: 12/08/2004 Document Revised: 02/17/2011 [facility] Patient Information 2012 [place], [place].

## 2011-08-25 NOTE — Progress Notes (Signed)
  Subjective:    Patient ID: [name], female    DOB: 1975/03/26, 36 y.o.   MRN: [id number]  HPI  Back pain- continues to have pain with movt since last visit.  Has continued to do zumba and has told instructor she needs gentle movt.  No changes in BMs.  No loss of bowel or bladder.  No numbness or tingling in legs  UTI- seen yesterday in UC for UTI.  Given 7 days of keflex.  Feels slightly better today.    Review of Systems    Denies CP, SOB, HA, N/V/D, fever  Objective:   Physical Exam Vital signs reviewed General appearance - alert, well appearing, and in no distress MSK- straight leg neg bilaterally, pain in hamstrings and quads with movt, some pain with back extension        Assessment & Plan:

## 2011-08-25 NOTE — Assessment & Plan Note (Signed)
Seen in UC yesterday and dx with UTI.  Under MRN [id number]. Given keflex x 7 days.  Will see back in one week, maks sure improved.

## 2011-08-25 NOTE — Assessment & Plan Note (Signed)
Continues to have back strain, likely due to tight hamstrings and quads.  No red flags.  Send to PT for further tx

## 2011-08-25 NOTE — Progress Notes (Signed)
Interpreter Kelsi Benham Namihira for Dr Spiegel  

## 2011-08-26 LAB — URINE CULTURE: Colony Count: >=100000

## 2011-08-26 NOTE — ED Notes (Signed)
Urine culture: >100,000 colonies E. Coli.  Pt. adequately treated with Keflex. Meeya Goldin M 08/26/2011  

## 2011-08-31 ENCOUNTER — Ambulatory Visit (INDEPENDENT_AMBULATORY_CARE_PROVIDER_SITE_OTHER): Payer: Self-pay | Admitting: Family Medicine

## 2011-08-31 ENCOUNTER — Encounter: Payer: Self-pay | Admitting: Family Medicine

## 2011-08-31 VITALS — BP 117/82 | HR 80 | Temp 97.8°F | Resp 18 | Wt 225.6 lb

## 2011-08-31 DIAGNOSIS — M549 Dorsalgia, unspecified: Secondary | ICD-10-CM

## 2011-08-31 DIAGNOSIS — E119 Type 2 diabetes mellitus without complications: Secondary | ICD-10-CM

## 2011-08-31 LAB — POCT GLYCOSYLATED HEMOGLOBIN (HGB A1C): Hemoglobin A1C: 8.4

## 2011-08-31 NOTE — Progress Notes (Signed)
  Subjective:    Patient ID: [name], female    DOB: [birth year], 36 y.o.   MRN: [id number]  HPI Patient continues to have right-sided pain that radiates from her back to her right lower corner. She says it is not connected to moving. It can happen at any time. She still eating and drinking well. Her urine is a normal color but has some burning when she pees. She has a mild nausea but is not vomiting. She has normal bowel movements. She has not had any fevers.   Review of Systems See above    Objective:   Physical Exam  Vital signs reviewed General appearance - alert, well appearing, and in no distress Heart - normal rate, regular rhythm, normal S1, S2, no murmurs, rubs, clicks or gallops Chest - clear to auscultation, no wheezes, rales or rhonchi, symmetric air entry, no tachypnea, retractions or cyanosis Abdomen-mild tenderness to palpation right lower quadrant and mild tenderness to palpation on the right lower back. No masses felt.     Assessment & Plan:

## 2011-08-31 NOTE — Assessment & Plan Note (Signed)
Back pain with radiation to right lower quadrant. Will get CT scan to evaluate for kidney stone since patient has had hematuria. Will see patient back after that result.

## 2011-08-31 NOTE — Patient Instructions (Addendum)
Por favor, vaya a la CT  Voy a dejar que usted sabe cul es el resultado de que tan pronto como lo [place]

## 2011-09-01 ENCOUNTER — Telehealth: Payer: Self-pay | Admitting: Family Medicine

## 2011-09-01 LAB — BASIC METABOLIC PANEL
BUN: 9 mg/dL (ref 6–23)
Chloride: 102 mEq/L (ref 96–112)
Potassium: 3.9 mEq/L (ref 3.5–5.3)

## 2011-09-01 NOTE — Telephone Encounter (Signed)
Pt has an upcoming appt & needs MD to sign off on the orders in epic

## 2011-09-02 ENCOUNTER — Other Ambulatory Visit: Payer: Self-pay | Admitting: Family Medicine

## 2011-09-02 DIAGNOSIS — M549 Dorsalgia, unspecified: Secondary | ICD-10-CM

## 2011-09-02 NOTE — Telephone Encounter (Signed)
I can't find any orders that need to be signed please let me know if they are

## 2011-09-02 NOTE — Telephone Encounter (Signed)
Spoke with [facility] @ radiology.  She will call Epic b/c they have had a few orders fall off and there is no sign of it in spigels box. Markhi Kleckner, Maryjo [place]

## 2011-09-05 ENCOUNTER — Other Ambulatory Visit: Payer: Self-pay | Admitting: Family Medicine

## 2011-09-05 ENCOUNTER — Ambulatory Visit (HOSPITAL_COMMUNITY)
Admission: RE | Admit: 2011-09-05 | Discharge: 2011-09-05 | Disposition: A | Discharge status: Home / Self Care | Payer: Self-pay | Source: Ambulatory Visit | Attending: Family Medicine | Admitting: Family Medicine

## 2011-09-05 ENCOUNTER — Telehealth: Payer: Self-pay | Admitting: Family Medicine

## 2011-09-05 DIAGNOSIS — R1031 Right lower quadrant pain: Secondary | ICD-10-CM | POA: Insufficient documentation

## 2011-09-05 DIAGNOSIS — K7689 Other specified diseases of liver: Secondary | ICD-10-CM | POA: Insufficient documentation

## 2011-09-05 DIAGNOSIS — M549 Dorsalgia, unspecified: Secondary | ICD-10-CM

## 2011-09-05 DIAGNOSIS — K8020 Calculus of gallbladder without cholecystitis without obstruction: Secondary | ICD-10-CM | POA: Insufficient documentation

## 2011-09-05 DIAGNOSIS — R109 Unspecified abdominal pain: Secondary | ICD-10-CM

## 2011-09-05 NOTE — Telephone Encounter (Signed)
Please call pt and let her know that she does not have a kidney stone.  She may have fatty liver and when she meets with her new doctor, she should discuss this with him.  She will need to see a GI doctor at that time.

## 2011-09-06 ENCOUNTER — Telehealth: Payer: Self-pay | Admitting: Family Medicine

## 2011-09-06 NOTE — Telephone Encounter (Signed)
Will call us back about what? [name], [name]

## 2011-09-06 NOTE — Telephone Encounter (Signed)
I called pt and LVM hopefully pt will call us back . ° ° °Marines °

## 2011-09-11 ENCOUNTER — Emergency Department (HOSPITAL_COMMUNITY)
Admission: EM | Admit: 2011-09-11 | Discharge: 2011-09-11 | Disposition: A | Discharge status: Home / Self Care | Payer: Self-pay | Attending: Emergency Medicine | Admitting: Emergency Medicine

## 2011-09-11 ENCOUNTER — Encounter (HOSPITAL_COMMUNITY): Payer: Self-pay | Admitting: *Deleted

## 2011-09-11 DIAGNOSIS — F3289 Other specified depressive episodes: Secondary | ICD-10-CM | POA: Insufficient documentation

## 2011-09-11 DIAGNOSIS — F329 Major depressive disorder, single episode, unspecified: Secondary | ICD-10-CM | POA: Insufficient documentation

## 2011-09-11 DIAGNOSIS — R1084 Generalized abdominal pain: Secondary | ICD-10-CM | POA: Insufficient documentation

## 2011-09-11 DIAGNOSIS — Z79899 Other long term (current) drug therapy: Secondary | ICD-10-CM | POA: Insufficient documentation

## 2011-09-11 DIAGNOSIS — E119 Type 2 diabetes mellitus without complications: Secondary | ICD-10-CM | POA: Insufficient documentation

## 2011-09-11 DIAGNOSIS — R112 Nausea with vomiting, unspecified: Secondary | ICD-10-CM | POA: Insufficient documentation

## 2011-09-11 DIAGNOSIS — K219 Gastro-esophageal reflux disease without esophagitis: Secondary | ICD-10-CM | POA: Insufficient documentation

## 2011-09-11 DIAGNOSIS — K529 Noninfective gastroenteritis and colitis, unspecified: Secondary | ICD-10-CM

## 2011-09-11 DIAGNOSIS — R197 Diarrhea, unspecified: Secondary | ICD-10-CM | POA: Insufficient documentation

## 2011-09-11 LAB — BASIC METABOLIC PANEL
BUN: 7 mg/dL (ref 6–23)
Chloride: 104 mEq/L (ref 96–112)
GFR calc non Af Amer: >90 mL/min (ref >90)
Glucose, Bld: 125 mg/dL — ABNORMAL HIGH (ref 70–99)
Potassium: 3.8 mEq/L (ref 3.5–5.1)
Sodium: 137 mEq/L (ref 135–145)

## 2011-09-11 LAB — CBC WITH DIFFERENTIAL/PLATELET
Basophils Relative: 0 % (ref 0–1)
Eosinophils Absolute: 0.2 10*3/uL (ref 0.0–0.7)
Eosinophils Relative: 2 % (ref 0–5)
HCT: 33 % — ABNORMAL LOW (ref 36.0–46.0)
Hemoglobin: 10.4 g/dL — ABNORMAL LOW (ref 12.0–15.0)
Lymphocytes Relative: 16 % (ref 12–46)
Monocytes Relative: 6 % (ref 3–12)
Neutro Abs: 5.8 10*3/uL (ref 1.7–7.7)
Neutrophils Relative %: 76 % (ref 43–77)
RBC: 4.68 MIL/uL (ref 3.87–5.11)
WBC: 7.7 10*3/uL (ref 4.0–10.5)

## 2011-09-11 LAB — URINALYSIS, ROUTINE W REFLEX MICROSCOPIC
Bilirubin Urine: NEGATIVE
Nitrite: NEGATIVE
Specific Gravity, Urine: 1.024 (ref 1.005–1.030)
Urobilinogen, UA: 0.2 mg/dL (ref 0.0–1.0)
pH: 5.5 (ref 5.0–8.0)

## 2011-09-11 LAB — URINE MICROSCOPIC-ADD ON

## 2011-09-11 LAB — POCT PREGNANCY, URINE: Preg Test, Ur: NEGATIVE

## 2011-09-11 MED ORDER — ONDANSETRON HCL 4 MG/2ML IJ SOLN
4.0000 mg | Freq: Once | INTRAMUSCULAR | Status: AC
  Administered 2011-09-11: 4 mg via INTRAVENOUS
  Filled 2011-09-11: qty 2

## 2011-09-11 MED ORDER — SODIUM CHLORIDE 0.9 % IV BOLUS (SEPSIS)
1000.0000 mL | Freq: Once | INTRAVENOUS | Status: AC
  Administered 2011-09-11: 1000 mL via INTRAVENOUS

## 2011-09-11 MED ORDER — SODIUM CHLORIDE 0.9 % IV SOLN
Freq: Once | INTRAVENOUS | Status: AC
  Administered 2011-09-11: 18:00:00 via INTRAVENOUS

## 2011-09-11 MED ORDER — ONDANSETRON HCL 4 MG PO TABS: 4.0000 mg | ORAL_TABLET | Freq: Four times a day (QID) | ORAL | Status: AC

## 2011-09-11 NOTE — ED Notes (Signed)
Pt reports n/v/d x 4-5 days and body aches. Pt does not speak english, used translator phones. No distress noted at triage.

## 2011-09-11 NOTE — ED Notes (Signed)
Reviewed instructions and prescriptions with patient she verbalized an understanding

## 2011-09-11 NOTE — ED Provider Notes (Signed)
Medical screening examination/treatment/procedure(s) were performed by non-physician practitioner and as supervising physician I was immediately available for consultation/collaboration.    Glynn Octave, MD 09/11/11 [phone]

## 2011-09-11 NOTE — Discharge Instructions (Signed)
FOLLOW UP WITH YOUR DOCTOR IF SYMPTOMS DO NOT RESOLVE IN 2 MORE DAYS. TAKE ZOFRAN AS NEEDED FOR NAUSEA. RETURN HERE WITH ANY WORSENING SYMPTOMS OR NEW CONCERNS.  Dieta para la Archivist (Diet for Diarrhea, Adult) La diarrea (deposicin frecuente de heces) tiene muchas causas. Este trastorno puede originarse o empeorar por lo que usted come o bebe. La diarrea pudiera aliviarse con un cambio en la dieta. SI NO TOLERA LOS ALIMENTOS SLIDOS:  Beba lquido en abundancia. Evite las bebidas azucaradas, las gaseosas y las bebidas lcteas.   Evite las bebidas que contengan cafena y alcohol.   Puede tratar con bebidas rehidratantes. O puede preparar usted mismo la siguiente receta:    cucharadita de sal.    cucharadita de bicarbonato.   ? de cucharadita de sal sustituta (cloruro de potasio).   1 Cucharada + 1 cucharadita de azcar.   1 litro de agua  A medida que las heces se vuelvan ms slidas, podr comenzar a ingerir alimentos slidos. Agregue un alimento por vez. Si ciertos alimentos le producen diarrea o se la empeoran, evtelos y pruebe con otros. Se recomienda una dieta baja en fibras y en grasas y sin lactosa. Las comidas frecuentes y en cantidades pequeas son mejor toleradas.  Fculas  Permitidos: [employer], francs, pita, bollos y rosquillas. Muffins, pan cimo. Galletas de [place], saladas o de graham. Pretzel, biscotes, bizcochos. Cereales cocidos en agua. Harina de maz, farina, crema de cereales. Cereales secos: Maz refinado, Wonda Cheng. Patatas preparadas de cualquier modo sin piel, macaroni, espaghetti, fideos, arroz refinado.   Evite: Pan, bollos o galletas preparadas con trigo entero, multigranos, salvado, semillas, frutos secos o coco. Tortilla de maz, bases de masa. Vase ms [place]. Chizitos, nachos. Cereales que contengan granos enteros, multigranos, coco, frutos secos o pasas de uva. Harina de avena cocida o seca. Cereales de grano grueso, granola. Cereales  promocionados como con "alto contenido de Eclectic". Cscara de patatas. Pastas de Marcy Panning, Cristino Martes. Palomitas de maz. Panecillos dulces, donas, panqueques, waffles, pan dulce.  Vegetales  Permitidos: Jugo de tomates o de vegetales. Vegetales bien cocidos o enlatados sin semillas. [place]: Deatra James, pepino sin cscara, calabaza, espinaca, brotes de soja.   Evite: [place], cocidos o enlatados: [place], porotos, remolacha, brccoli, repollitos de [place], maz, coles, legumbres, arvejas, batatas. Cocidos: Repollo verde o rojo, espinacas. Evite las porciones grandes de Education officer, environmental, debido a que los vegetales disminuyen su tamao al cocinarlos y contienen ms fibras por porcin.  Nils Pyle  Permitidos: Todas las frutas excepto el jugo de ciruelas. Cocidas o enlatadas: Duraznos, pur de [place], meln, cerezas, cctel de frutas, pomelo, uvas, kiwi, naranjas, melocotn, pera, ciruelas, sandas. [place]: Manzanas sin la piel, banana madura, uvas, meln, cerezas, pomelo, duraznos, naranjas, ciruelas. Limite las porciones a  taza o 1 unidad.   Evite: [place]: Manzana con piel, damasco, mango, pera, frambuesa, frutillas. Jugo de ciruela, compota o ciruelas secas. Frutas secas, pasas de uva, dtiles. Evite porciones grandes de todas las frutas frescas.  Carnes y [employer]  Permitidos: Carne molida o un bife tierno bien cocido, jamn, ternera, cordero, cerdo o aves. Huevos, queso. pescado, ostra, langostinos, [place], frutos de mar. Hgado y otros rganos.   Evite: Carnes duras y fibrosas con TEFL teacher. Mantequilla de man, suave o entera. Quesos con semillas, frutos secos u otros alimentos no permitidos. Frutos secos, semillas, legumbres, arvejas secas, lentejas.  Leche  Permitidos: Yogur, [employer], kefir, yogur bebible, suero de la [place], [place] de soja.   Evite: Osvaldo Human  chocolatada, bebidas hechas con leche, batidos.  Sopas  Permitidos: Consom, caldo o sopas hechas con los  alimentos permitidos. Cualquier sopa colada.   Evite: Sopas hechas con vegetales no permitidos, sopas basadas en cremas o leche.  Postres y [place]  Permitidos: Gelatina sin azcar, helados de agua sin azcar.   Evite: Tortas y masitas, pasteles hechos con frutas permitidas, budines, natillas, pasteles con crema. Gelatina, fruta, hielo, sorbetes, helados de agua. Helados, batidos sin frutos secos. Caramelos duros, miel, gelatina, melaza, jarabes, azcar, jarabe de chocolate, pastillas de goma, malvaviscos.  Grasas y Aceites  Permitidos: Evite todas las grasas y [place].   Evite: Semillas, frutos secos, aceitunas, paltas. Margarina, Phillips Grout, crema, mayonesa, aceites para [place], aderezos para ensaladas hechos con alimentos permitidos. Salsas, tocino sin corteza.  Bebidas  Permitidos: Agua, tes descafeinados, soluciones de rehidratacin oral, bebidas sin azcar.   Evite: Jugos de fruta, bebidas con cafena (como caf, t y bebidas cola), alcohol, bebidas deportivas o bebidas lima- limn.  Condimentos  Permitidos: Ketchup, mostaza, rbano picante, vinagre, cremas, crema de queso, polvo de cacao. Especias con moderacin: [place], laurel, perejil, curry, tomillo, gengibre, mejorana, polvo de cebolla o de ajo, organo, paprika, perejil, pimienta molida, romero, salvia, ajedrea, estragn, tomillo, crcuma.   Evite: Coco, miel.  Control del peso: [employer]. Controle su peso todas las maanas despus de orinar y antes de Engineer, maintenance. Psese siempre con la misma ropa. Registre su peso diariamente. En su prxima visita traiga el registro de sus pesos. Comunquese inmediatamente con su mdico si ha aumentado 3 libras (1,4 Kg) o ms en un da o 5 libras (2,3 Kg) en una semana. SOLICITE ATENCIN MDICA DE INMEDIATO SI:  No puede retener lquidos.   Aparecen vmitos o la diarrea se hace recurrente (vuelve una y Laverda Page).   Aparece dolor en el vientre (abdominal ) que aumenta o se siente  en un punto determinado (se localiza).   Usted tienen una temperatura oral de ms de 102 F (38.9 C) y no puede controlarla con medicamentos.   La diarrea se hace excesiva o contiene sangre o mucosidad.   Presenta debilidad excesiva, mareos, lipotimia o sed extrema.  ASEGRESE QUE:  Comprende estas instrucciones.   Controlar su enfermedad.   Solicitar ayuda inmediatamente si no mejora o si empeora.  Document Released: 02/28/2005 Document Revised: 02/17/2011 [facility] Patient Information 2012 [place], [place].  Dieta B.R.A.T. (B.R.A.T. Diet) Su mdico le ha recomendado la dieta B.R.A.T para usted o su hijo hasta que su enfermedad mejore. Se utiliza comnmente para ayudar a Chief Operating Officer los sntomas de diarrea y vmitos. Si usted o su hijo pueden tolerar el consumo de lquidos claros, tambin pueden consumir:  Bananas.   Arroz.   Compota de Anna.   Marvell Fuller (y otros almidones simples como galletas, patatas, y fideos).  Asegrese de [employer] productos lcteos, carnes, y alimentos grasosos hasta que los sntomas mejoren. Los jugos de fruta como el de [place], uvas, o ciruela, pueden [employer]. Evtelos. Contine esta dieta por 2 das o segn las indicaciones del profesional que lo asiste. Document Released: 02/28/2005 Document Revised: 02/17/2011 [facility] Patient Information 2012 [place], [place].

## 2011-09-11 NOTE — ED Notes (Signed)
Pt reports generalized abd pain, generalized body aches, headache, and N/V/D x3-4 days, pt's children have also w/same symptoms and are currently in the PEDS ED being treated for the same, pt denies any recent travel.

## 2011-09-11 NOTE — ED Provider Notes (Signed)
The patient arrives in CDU feeling improved. She appears comfortable, labs reviewed. Abdomen benign on exam. She is currently attempting PO fluids. Will re-evaluate.  5:55 - no further vomiting, she does endorse persistent nausea. She has had two small bowel movements. She continues to have some abdominal cramping, but no worse than before. Abdomen continues to be soft and minimally tender generally. Plan is to give another dose of Zofran and then to discharge home. Patient understands and agrees.   Rodena Medin, PA-C 09/11/11 1757

## 2011-09-11 NOTE — ED Provider Notes (Signed)
Medical screening examination/treatment/procedure(s) were performed by non-physician practitioner and as supervising physician I was immediately available for consultation/collaboration.   Aara Jacquot, MD 09/11/11 1616 

## 2011-09-11 NOTE — ED Provider Notes (Signed)
History     CSN: [id number]  Arrival date & time 09/11/11  1342   First MD Initiated Contact with Patient 09/11/11 1358      Chief Complaint  Patient presents with  . Emesis  . Diarrhea  . Abdominal Pain    (Consider location/radiation/quality/duration/timing/severity/associated sxs/prior treatment) HPI  Patient is a non-insulin-dependent diabetic who is followed by [facility] as her primary care physician presents the emergency department complaining of a 4 to five-day history of acute onset nausea vomiting and diarrhea with associated body aches. Patient is a non-English speaker and therefore history was obtained by patient with an interpreter. Patient states her 2 children at home have similar symptoms and that they're being evaluated in the pediatric ER for nausea vomiting and diarrhea. Patient states she's been taking no medication at home for the symptoms. Patient states symptoms are acute onset, persistent, and unchanging. She complains of mild diffuse abdominal pain it's associated with her vomiting and her diarrhea. She has no point specific abdominal pain. She denies known fevers. Patient denies any recent travel or camping. She denies aggravating or alleviating factors.   Past Medical History  Diagnosis Date  . Depression   . Diabetes mellitus   . GERD (gastroesophageal reflux disease)     Past Surgical History  Procedure Date  . Tubal ligation     Family History  Problem Relation Age of Onset  . Hypertension Mother   . Diabetes Father     History  Substance Use Topics  . Smoking status: Never Smoker   . Smokeless tobacco: Never Used  . Alcohol Use: No    OB History    Grav Para Term Preterm Abortions TAB SAB Ect Mult Living   3 3 3       3       Review of Systems  All other systems reviewed and are negative.    Allergies  Review of patient's allergies indicates no known allergies.  Home Medications   Current Outpatient Rx  Name  Route Sig Dispense Refill  . GLYBURIDE 5 MG PO TABS Oral Take 2 tablets (10 mg total) by mouth 2 (two) times daily with a meal. 120 tablet 11  . METFORMIN HCL 1000 MG PO TABS Oral Take 1 tablet (1,000 mg total) by mouth 2 (two) times daily with a meal. 180 tablet 1    BP 115/79  Pulse 90  Temp 98.4 F (36.9 C) (Oral)  Resp 19  SpO2 98%  LMP 08/28/2011  Physical Exam  Nursing note and vitals reviewed. Constitutional: She is oriented to person, place, and time. She appears well-developed and well-nourished. No distress.  HENT:  Head: Normocephalic and atraumatic.  Eyes: Conjunctivae are normal.  Neck: Normal range of motion. Neck supple.  Cardiovascular: Normal rate, regular rhythm, normal heart sounds and intact distal pulses.  Exam reveals no gallop and no friction rub.   No murmur heard. Pulmonary/Chest: Effort normal and breath sounds normal. No respiratory distress. She has no wheezes. She has no rales. She exhibits no tenderness.  Abdominal: Soft. Bowel sounds are normal. She exhibits no distension and no mass. There is tenderness. There is no rebound and no guarding.       Mild diffuse abdominal TTP with no point specific TTP, rigidity, guarding or peritoneal signs.   Musculoskeletal: Normal range of motion. She exhibits no edema and no tenderness.  Neurological: She is alert and oriented to person, place, and time.  Skin: Skin is warm and dry.  No rash noted. She is not diaphoretic. No erythema.  Psychiatric: She has a normal mood and affect.    ED Course  Procedures (including critical care time)  IV fluids, IV zofran.   Labs Reviewed  CBC WITH DIFFERENTIAL - Abnormal; Notable for the following:    Hemoglobin 10.4 (*)     HCT 33.0 (*)     MCV 70.5 (*)     MCH 22.2 (*)     RDW 15.7 (*)     All other components within normal limits  POCT PREGNANCY, URINE  BASIC METABOLIC PANEL  URINALYSIS, ROUTINE W REFLEX MICROSCOPIC   No results found.   No diagnosis  found.    MDM  Likely viral gastroenteritis given that she and her children have the same symptoms. However since patient is a non-insulin-dependent diabetic I will obtain labs to check on her blood glucose and electrolytes. Will give IV fluids. Sign out given to Lorenz Coaster, physician assistant, with patient's dispo pending her reevaluation and ability to tolerate by mouth fluids and based on her pending labs. She has a nonacute abdomen. She's afebrile nontoxic-appearing.       Ken Caryl, [place] 09/11/11 [phone]

## 2011-09-11 NOTE — ED Notes (Signed)
I gave the patient a warm blanket. 

## 2011-09-12 ENCOUNTER — Encounter (HOSPITAL_COMMUNITY): Payer: Self-pay | Admitting: *Deleted

## 2011-09-13 ENCOUNTER — Ambulatory Visit: Payer: Self-pay | Admitting: Family Medicine

## 2011-09-16 ENCOUNTER — Ambulatory Visit: Payer: Self-pay

## 2011-09-30 ENCOUNTER — Ambulatory Visit (INDEPENDENT_AMBULATORY_CARE_PROVIDER_SITE_OTHER): Payer: Self-pay | Admitting: Family Medicine

## 2011-09-30 ENCOUNTER — Encounter: Payer: Self-pay | Admitting: Family Medicine

## 2011-09-30 VITALS — BP 118/77 | HR 73 | Temp 97.7°F | Ht 58.5 in | Wt 226.1 lb

## 2011-09-30 DIAGNOSIS — K760 Fatty (change of) liver, not elsewhere classified: Secondary | ICD-10-CM

## 2011-09-30 DIAGNOSIS — R1084 Generalized abdominal pain: Secondary | ICD-10-CM

## 2011-09-30 DIAGNOSIS — K7689 Other specified diseases of liver: Secondary | ICD-10-CM

## 2011-09-30 MED ORDER — SIMVASTATIN 10 MG PO TABS: 10.0000 mg | ORAL_TABLET | Freq: Every day | ORAL | Status: DC

## 2011-09-30 NOTE — Patient Instructions (Addendum)
It was good to meet you today. Please stop by the lab today. Come back in one month.  Kalah Pflum M. Generoso Cropper, M.D.

## 2011-09-30 NOTE — Progress Notes (Signed)
Interpreter Keitha Kolk Namihira for [place] 

## 2011-10-01 ENCOUNTER — Encounter: Payer: Self-pay | Admitting: Family Medicine

## 2011-10-01 DIAGNOSIS — K760 Fatty (change of) liver, not elsewhere classified: Secondary | ICD-10-CM | POA: Insufficient documentation

## 2011-10-01 MED ORDER — OMEPRAZOLE 20 MG PO CPDR: 20.0000 mg | DELAYED_RELEASE_CAPSULE | Freq: Every day | ORAL | Status: DC

## 2011-10-01 NOTE — Assessment & Plan Note (Addendum)
Will step by step into address patient's abdominal pain. Her epigastric tenderness seems to be the most pressing issue today. Previously prescribed Prevacid which she was not taken. Patient may continue taking Prevacid and she has a home or will send him prescription for Prilosec 20 mg by mouth daily to the [facility]. Patient to avoid foods that may trigger her reflux such as spicy or acidic foods. We will also check hepatic function today. Patient to return in one month for followup. If she develops any dysuria, nonstop diarrhea, constipation or vomiting she should return to the clinic sooner.

## 2011-10-01 NOTE — Assessment & Plan Note (Signed)
BMI of 46. Patient does have history of diabetes. States she has been trying to diet, per her current social situation is making it difficult for her. We'll encourage her once again to try to lose weight and exercise. Also please refer to clinical social work as patient is living in a hotel at this time with her children.

## 2011-10-01 NOTE — Assessment & Plan Note (Addendum)
Incidental finding on abdominal CT scan. Patient does not have insurance therefore will try to do evaluation in clinic prior to GI referral. Will get hepatitis panel today. LFTs have been done recently. Will encourage patient to follow a low-fat diet. Return to clinic in one month.

## 2011-10-01 NOTE — Progress Notes (Signed)
  Subjective:    Patient ID: [name], female    DOB: 27-Dec-1975, 35 y.o.   MRN: [id number]  HPI  Patient is a 36 yr old female presenting to the office today for followup for abdominal pain. Interpreter present for entire interview and exam.  1. Abdominal Pain- patient has been worked up multiple times for generalized abdominal pain. His workup has included urinalysis, multiple office visits, and abdominal CT scan. Patient's complaint today continues. She reports pain throughout her abdomen but most specifically on the right flank as well as epigastric area. Patient's most recent abdominal CT scan did not reveal kidney stones but it did show hepatic steatosis. Patient states she has been trying to follow a low-fat diet, but nothing seems to make her pain better. She was treated for a urinary tract infection over one month ago and she states at that time her pain improved. She has had urinalysis since then which did not show a urinary tract infection. She states she was treated for a GI bug recently and continues to have intermittent diarrhea. She denies constipation, vomiting, bloody stool. She is currently on her menses, but this does not seem to contribute to her abdominal pain. Her abdominal pain is her main complaint today.  2. Obesity- patient is morbidly obese with a BMI of 46. She states she has been trying to diet. On further interview with interpreter, patient revealed that she is currently homeless. She lives in a hotel and does not have access to healthy  food choices.  3. Hepatic Steatosis- incidental finding on abdominal CT scan. More than likely this is a contributing to the extent of patient's abdominal pain. Recent LFTs were slightly elevated but not greatly elevated.  History reviewed: Nonsmoker  Review of Systems See HPI above    Objective:   Physical Exam  Constitutional: She is oriented to person, place, and time. She appears well-developed and well-nourished. No  distress.  HENT:  Head: Normocephalic and atraumatic.  Cardiovascular: Normal rate and regular rhythm.   Pulmonary/Chest: Effort normal.  Abdominal: Soft.       Obese abdomen. Tender to palpation in all quadrants. Most notable in epigastric area. No rebound. No CVA, but does have paraspinal tenderness.  Neurological: She is alert and oriented to person, place, and time.    Assessment & Plan:   36 yo F with abdominal pain

## 2011-10-06 ENCOUNTER — Telehealth: Payer: Self-pay | Admitting: Clinical
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
## 2011-11-01 ENCOUNTER — Ambulatory Visit: Payer: Self-pay | Admitting: Family Medicine

## 2011-11-29 ENCOUNTER — Encounter: Payer: Self-pay | Admitting: Family Medicine

## 2011-11-29 ENCOUNTER — Ambulatory Visit (INDEPENDENT_AMBULATORY_CARE_PROVIDER_SITE_OTHER): Payer: Self-pay | Admitting: Family Medicine

## 2011-11-29 VITALS — BP 94/66 | HR 99 | Temp 98.3°F | Ht 58.5 in | Wt 224.0 lb

## 2011-11-29 DIAGNOSIS — F329 Major depressive disorder, single episode, unspecified: Secondary | ICD-10-CM

## 2011-11-29 DIAGNOSIS — E119 Type 2 diabetes mellitus without complications: Secondary | ICD-10-CM

## 2011-11-29 DIAGNOSIS — F320 Major depressive disorder, single episode, mild: Secondary | ICD-10-CM

## 2011-11-29 DIAGNOSIS — K760 Fatty (change of) liver, not elsewhere classified: Secondary | ICD-10-CM

## 2011-11-29 DIAGNOSIS — K7689 Other specified diseases of liver: Secondary | ICD-10-CM

## 2011-11-29 DIAGNOSIS — R1084 Generalized abdominal pain: Secondary | ICD-10-CM

## 2011-11-29 MED ORDER — GLYBURIDE 5 MG PO TABS: 10.0000 mg | ORAL_TABLET | Freq: Two times a day (BID) | ORAL | Status: DC

## 2011-11-29 MED ORDER — METFORMIN HCL 1000 MG PO TABS: 1000.0000 mg | ORAL_TABLET | Freq: Two times a day (BID) | ORAL | Status: DC
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
## 2011-11-29 NOTE — Assessment & Plan Note (Signed)
Started on Zocor. Will return to clinic for fasting lab draws to check full lipid panel. Continue Zocor.

## 2011-11-29 NOTE — Progress Notes (Signed)
Interpreter Santhiago Collingsworth Namihira for [place] 

## 2011-11-29 NOTE — Assessment & Plan Note (Signed)
Improved. Continue Prilosec as well as Zocor. No further imaging at this time. I am pleased that she feels better.

## 2011-11-29 NOTE — Patient Instructions (Signed)
Thank you for coming in today. We will check labs and I will send you a letter with the results.  Please call [facility] to get an appointment.  Please come back in 3 months, or sooner if you need anything.  Virgen Belland M. Hani Patnode, M.D.

## 2011-11-29 NOTE — Assessment & Plan Note (Signed)
Will check A1C with next lab draw (in next few days.) For now, continue current regimen. Refills sent to the pharmacy.

## 2011-11-29 NOTE — Assessment & Plan Note (Signed)
Patient is safe. We had a verbal safety contract to call 9-1-1 if she does not feel safe or if she feels like hurting herself. Patient agrees. Will refer to [facility] for formal evaluation. No medication started today. RTC in 2 weeks for follow up for depression.

## 2011-12-02 ENCOUNTER — Other Ambulatory Visit (INDEPENDENT_AMBULATORY_CARE_PROVIDER_SITE_OTHER): Payer: Self-pay

## 2011-12-02 DIAGNOSIS — E119 Type 2 diabetes mellitus without complications: Secondary | ICD-10-CM

## 2011-12-02 LAB — LIPID PANEL
HDL: 29 mg/dL — ABNORMAL LOW (ref >39)
LDL Cholesterol: 56 mg/dL (ref 0–99)
Triglycerides: 161 mg/dL — ABNORMAL HIGH (ref <150)
VLDL: 32 mg/dL (ref 0–40)

## 2011-12-02 LAB — POCT GLYCOSYLATED HEMOGLOBIN (HGB A1C): Hemoglobin A1C: 7.7

## 2011-12-02 NOTE — Progress Notes (Unsigned)
FLP AND A1C DONE TODAY [name] 

## 2011-12-06 ENCOUNTER — Encounter: Payer: Self-pay | Admitting: Family Medicine

## 2011-12-07 ENCOUNTER — Ambulatory Visit (INDEPENDENT_AMBULATORY_CARE_PROVIDER_SITE_OTHER): Payer: Self-pay | Admitting: Family Medicine

## 2011-12-07 ENCOUNTER — Encounter: Payer: Self-pay | Admitting: Family Medicine

## 2011-12-07 VITALS — BP 103/70 | HR 99 | Temp 98.2°F | Resp 26 | Ht 58.5 in | Wt 221.0 lb

## 2011-12-07 DIAGNOSIS — J069 Acute upper respiratory infection, unspecified: Secondary | ICD-10-CM

## 2011-12-07 MED ORDER — GUAIFENESIN-CODEINE 100-10 MG/5ML PO SYRP: 5.0000 mL | ORAL_SOLUTION | Freq: Three times a day (TID) | ORAL | Status: DC | PRN

## 2011-12-07 MED ORDER — GUAIFENESIN 200 MG PO TABS: 400.0000 mg | ORAL_TABLET | ORAL | Status: DC | PRN

## 2011-12-07 NOTE — Progress Notes (Signed)
  Subjective:    Patient ID: [name], female    DOB: 08-20-1975, 36 y.o.   MRN: [id number]  HPI  [name] comes in with cough, nasal congestion, sore throat, and chills, that have been going on for about a 5 days.  She says she is coughing up green/yellow sputum.  She has had subjective fevers and chills, but has not taken her temperature.  She says she cannot breath through her nose, so it his hard to keep up with her children.  She says the sore throat is the worse part.  She has not taken any medications for this.    Review of Systems See HPI    Objective:   Physical Exam BP 103/70  Pulse 99  Temp 98.2 F (36.8 C) (Oral)  Resp 26  Ht 4' 10.5" (1.486 m)  Wt 221 lb (100.245 kg)  BMI 45.40 kg/m2  SpO2 100%  LMP 11/29/2011 General appearance: alert, cooperative and mild distress Ears: normal TM's and external ear canals both ears Nose: mild congestion, turbinates red Throat: lips, mucosa, and tongue normal; teeth and gums normal and no tonsillar exudates. Neck: no adenopathy and supple, symmetrical, trachea midline Lungs: clear to auscultation bilaterally and patient somewhat tachypnic Heart: regular rate and rhythm, S1, S2 normal, no murmur, click, rub or gallop Extremities: extremities normal, atraumatic, no cyanosis or edema       Assessment & Plan:

## 2011-12-07 NOTE — Patient Instructions (Signed)
Gripe en el adulto (Influenza, Adult) La gripe es una infeccin viral del tracto respiratorio. Causa escalofros, fiebre, tos, cefaleas, [place] corporales y Engineer, mining de Advertising copywriter. La gripe lo har sentir ms enfermo que cuando tiene un resfro comn. Lo sntomas de la enfermedad duran unos [employer]. La tos y el cansancio pueden durar otros 7 a 10 das. La gripe es muy contagiosa. Se disemina fcilmente a travs de las gotas de la tos y el estornudo. Una persona puede infectarse tocando algo que contenga el virus y luego tocndose la boca, los ojos o la Clinical cytogeneticist. La infeccin la causa un virus. Los sntomas no mejorarn con antibiticos. Los antibiticos se utilizan para destruir bacterias, no virus. DIAGNSTICO El diagnstico de la gripe est a menudo basado en la historia clnica y el examen fsico como tambin de informes de la presencia de la gripe en la comunidad. Si se sospecha la enfermedad pero no se tiene certeza, se Immunologist. TRATAMIENTO Como la cause es un virus, los antibiticos no son de [place]. El mdico podr prescribirle medicamentos antivirales para acortar la enfermedad y disminuir su gravedad. Tambin le podr recomendar una vacuna contra la gripe o medicamentos antivirales para los miembros de la familia para prevenir el contagio. INSTRUCCIONES PARA EL CUIDADO DOMICILIARIO  NO ADMINISTRE ASPIRINA A MENORES DE 18 AOS QUE PADEZCAN GRIPE. Puede provocar un dao en el cerebro y en el hgado si produjera el sndrome de [place]. Lea las etiquetas de los medicamentos de venta libre antes de Youth worker.   No vaya al Aleen Campi o a la escuela, si es posible, hasta que se hayan ido la mayor parte de sus sntomas.   Utilice los medicamentos de venta libre o de prescripcin para Chief Technology Officer, Environmental health practitioner o la [place], segn se lo indique el profesional que lo asiste.   Utilice un humidificador de niebla fra para aumentar la humedad del [place]. Esto le permitir Solicitor.   Haga reposo  hasta que la temperatura sea normal: 98.6 F (37 C). Generalmente esto lleva entre 3 y [address] Descanse lo suficiente.   Beba al menos 8 vasos de 250 cc de lquido por da, como agua, [place], caldo, [place]. [place] su boca y su nariz al toser o estornudar y lvese las manos con frecuencia para prevenir el contagio a Economist.  PREVENCIN La vacuna anual contra la gripe es la mejor forma de Cabin crew. Se recomienda una vacuna anual contra la gripe para todos los adultos de los [place]. SOLICITE ATENCIN MDICA SI:  Presenta dificultad respiratoria durante el reposo.   Tiene mucha tos con produccin de moco o siente dolor en el pecho.   Si tiene nuseas (ganas de vomitar), vmitos o diarrea.  SOLICITE ATENCIN MDICA DE INMEDIATO SI:  Si tiene dificultad para respirar, le falta la respiracin o la piel o las uas se tornan azulados.   Presenta dolor o rigidez en el cuello.   Comienza a sentir un dolor de cabeza intenso, dolor en el rostro o en los odos.   Tiene fiebre.   Presenta nuseas o vmitos que no puede controlar.  Document Released: 12/08/2004 Document Revised: 02/17/2011 [facility] Patient Information 2012 [place], [place].

## 2011-12-07 NOTE — Assessment & Plan Note (Signed)
Patient appears uncomfortable, but lungs are clear, and pulse ox is 100%, afebrile.  Reassured her, discussed symptomatic care for URI/Flu like illness.  Rx for cough medication with codeine in it, and mucinex.  Advised nasal saline washes.  Reviewed red flags for which to return to care.   Case discussed with Dr. Earnest Bailey, who also examined the patient.

## 2011-12-15 ENCOUNTER — Ambulatory Visit: Payer: Self-pay | Admitting: Family Medicine

## 2011-12-27 ENCOUNTER — Encounter: Payer: Self-pay | Admitting: Family Medicine

## 2011-12-27 ENCOUNTER — Ambulatory Visit (INDEPENDENT_AMBULATORY_CARE_PROVIDER_SITE_OTHER): Payer: Self-pay | Admitting: Family Medicine

## 2011-12-27 VITALS — BP 116/78 | HR 83 | Temp 98.6°F | Ht 58.5 in | Wt 220.0 lb

## 2011-12-27 DIAGNOSIS — E119 Type 2 diabetes mellitus without complications: Secondary | ICD-10-CM

## 2011-12-27 DIAGNOSIS — J029 Acute pharyngitis, unspecified: Secondary | ICD-10-CM | POA: Insufficient documentation

## 2011-12-27 DIAGNOSIS — F329 Major depressive disorder, single episode, unspecified: Secondary | ICD-10-CM

## 2011-12-27 DIAGNOSIS — F320 Major depressive disorder, single episode, mild: Secondary | ICD-10-CM

## 2011-12-27 NOTE — Assessment & Plan Note (Addendum)
Patient denies SI/HI at this time and states she feels safe. Patient states she would call Jonetta Speak, me or 911 if/when she feels another "crisis" coming on. I have left a VM at [facility] to call patient directly to schedule counseling. Interpreter present also recommended a walk-in clinic downtown that the patient is aware of. She could try there as well to get counseling. Discussed case briefly with Dr. Pascal Lux (after patient had left clinic) and mental health resources are limited. Patient does not meet criteria for IVC at this time, and she is very appropriate with her son in clinic today. Will RTC in one month for follow-up. If she needs anything before then, she has agreed to call clinic for an earlier appointment.    ADDENDUM from Spero Geralds, Psy.D.:  I called [facility] and left a message for them to call me to try to facilitate a referral - at least to see if they have Spanish speaking providers.  Additionally, I plugged "sliding fee schedule" and "Spanish speaking" into a mental health service provider database.  The resources that came up were not current.  I will email a Spanish speaking psychologist in the community to see if she knows of resources available for people that do not have insurance.

## 2011-12-27 NOTE — Progress Notes (Signed)
Patient ID: [name], female   DOB: 1975-11-17, 36 y.o.   MRN: [id number] [name] [name] M. Mary Sobocinski, MD Phone: [phone]   Subjective: HPI: Patient is a 36 y.o. female presenting to clinic today for follow up. Interpreter was present throughout entire history and physical exam. Concerns today include:  1. "Mental health" - Patient has a long standing history of depression. She has been seen by a few group counselors as well as her friend, [name], who she contacts via telephone. She states since she saw me last she had a "crisis" where her children were screaming and not listening and she got upset. At that time she picked up things but did not injure herself of her children. She denies SI at this time, although she states she has thought about it in the past but no plan because she thinks of her children. She does not go to the ED for help during her "crises" because she is afraid for her children. She has tried [employer] [facility] [place] without a reply. She does not take any medications for her depression at this time. She is easily tearful talking but smiles appropriately. Her son is also present in the exam room, and therefore he is hearing our discussion. Therefore once safety was confirmed, we did not dwell on her depression or thoughts.  2. Lab results- Lab results show excellent LDL, but low HDL. Also, her A1c was 7.7 which has improved since June. She was concerned about how tired she felt and on further review, she was somewhat anemic with a low MCV likely due to iron deficiency anemia. No headaches, bruising, syncope or edema.  3. Pork reaction- patient is concerned that 15 years ago she had "blisters" in her throat when she ate a pork product. She has not had any pork in 12 years and wants to know if she can eat pork again. It is unclear if this was due to allergic reaction or if she had an infection of her throat. She states she does not  have any other food allergies.  4. Diabetes- Better controlled. Not currently dieting or exercising. Does not check  CBG at home. Denies polydipsia, polyuria, increased abd pain or dizziness/lows.  History Reviewed: Non smoker. Health Maintenance: Needs flu shot  ROS: Please see HPI above.  Objective: Office vital signs reviewed.  Physical Examination:  General: Awake, alert. NAD. HEENT: Atraumatic, normocephalic Pulm: CTAB, no wheezes Cardio: RRR, no murmurs appreciated Abdomen: Obese. +BS, soft, nontender, nondistended Extremities: No edema Neuro: Grossly intact Psych: Well-groomed. Makes appropriate eye contact. Smiles and interacts. Becomes tearful when discussing stressors.   Assessment: 36 yo F follow up appointment  Plan: See Problem List and After Visit Summary

## 2011-12-27 NOTE — Assessment & Plan Note (Signed)
History of throat soreness when eating pork. Unsure if she had an infection in her throat or if she could have had a mild allergic reaction to pork product. If patient chooses to eat pork, she should only eat a small amount. If she feels flushed, swelling, throat pain, difficulty breathing or rash she should call 9-1-1 immediately.

## 2011-12-27 NOTE — Assessment & Plan Note (Signed)
A1c 7.7. Continue current medications. Encouraged diet and exercise. Recheck A1C in 3 months.

## 2011-12-27 NOTE — Patient Instructions (Signed)
It was good to see you today.   I have placed a call to [facility] and they may call you back. YOu can also go to the walk-in clinic at [facility] near [address]. This may be the best option for you although I do not know the name of the building.  Please come back to see me in one month or sooner if needed.  Trivia Heffelfinger M. Carrina Schoenberger, M.D.

## 2012-01-26 ENCOUNTER — Ambulatory Visit: Payer: Self-pay | Admitting: Family Medicine

## 2012-02-27 ENCOUNTER — Encounter: Payer: Self-pay | Admitting: Family Medicine

## 2012-02-27 ENCOUNTER — Telehealth: Payer: Self-pay | Admitting: Family Medicine

## 2012-02-27 ENCOUNTER — Other Ambulatory Visit (HOSPITAL_COMMUNITY)
Admission: RE | Admit: 2012-02-27 | Discharge: 2012-02-27 | Disposition: A | Discharge status: Home / Self Care | Payer: No Typology Code available for payment source | Source: Ambulatory Visit | Attending: Family Medicine | Admitting: Family Medicine

## 2012-02-27 ENCOUNTER — Ambulatory Visit (INDEPENDENT_AMBULATORY_CARE_PROVIDER_SITE_OTHER): Payer: No Typology Code available for payment source | Admitting: Family Medicine

## 2012-02-27 VITALS — BP 136/83 | HR 88 | Temp 97.6°F | Wt 219.0 lb

## 2012-02-27 DIAGNOSIS — N898 Other specified noninflammatory disorders of vagina: Secondary | ICD-10-CM

## 2012-02-27 DIAGNOSIS — R102 Pelvic and perineal pain: Secondary | ICD-10-CM

## 2012-02-27 DIAGNOSIS — N949 Unspecified condition associated with female genital organs and menstrual cycle: Secondary | ICD-10-CM

## 2012-02-27 DIAGNOSIS — Z113 Encounter for screening for infections with a predominantly sexual mode of transmission: Secondary | ICD-10-CM | POA: Insufficient documentation

## 2012-02-27 LAB — POCT WET PREP (WET MOUNT)

## 2012-02-27 MED ORDER — FLUCONAZOLE 150 MG PO TABS: 150.0000 mg | ORAL_TABLET | Freq: Once | ORAL | Status: DC

## 2012-02-27 NOTE — Telephone Encounter (Signed)
Please call patient and let her know that the wet prep showed a yeast infection.  Rx for diflucan, which she will take one pill once [facility].

## 2012-02-27 NOTE — Progress Notes (Signed)
  Subjective:    Patient ID: [name], female    DOB: 06/08/1975, 36 y.o.   MRN: [id number]  HPI  Jakara comes in with a "blister" next to her vagina that has been there for two weeks.  She denies any history of herpes, or other STD's.  No fevers, no dysuria, no abdominal pain.  She says she just used an OTC vaginal suppository for the irritation but is not sure what kind of medication it is.   Review of Systems See HPI    Objective:   Physical Exam BP 136/83  Pulse 88  Temp 97.6 F (36.4 C) (Oral)  Wt 219 lb (99.338 kg) General appearance: alert, cooperative, no distress and morbidly obese Pelvic:There is a small 1mm cut that runs vertically next to labia.  No drainage, no induration.  The cervix normal in appearance, no adnexal masses or tenderness, no cervical motion tenderness, rectovaginal septum normal and uterus normal size, shape, and consistency. There is a thick white substance in vaginal vault consistent with vaginal suppository.        Assessment & Plan:

## 2012-02-27 NOTE — Assessment & Plan Note (Signed)
Has open cut on labia without signs of infection.  Does not appear to be a blister.  Advised neosporin on the area.  Unclear if she truly has vaginal discharge due to use this morning of vaginal suppository.  Wet prep and GC/Chlamydia sent.

## 2012-02-27 NOTE — Patient Instructions (Signed)
You have a small cut or skin tear next to your vagina.  Please try some neosporin on it.    I will send you a letter with your lab results, or call you if anything is abnormal.    Usted tiene un pequeo corte o desgarro de la piel al lado de su vagina. Por favor, pruebe algunas neosporin en l.  Le enviar una carta con los resultados de laboratorio, o llamarle si algo es anormal.

## 2012-02-28 NOTE — Telephone Encounter (Signed)
I spoke with pt's husband and let a msn for pt about medication.  [employer]

## 2012-04-20 ENCOUNTER — Encounter: Payer: Self-pay | Admitting: Family Medicine

## 2012-04-20 ENCOUNTER — Ambulatory Visit (INDEPENDENT_AMBULATORY_CARE_PROVIDER_SITE_OTHER): Payer: No Typology Code available for payment source | Admitting: Family Medicine

## 2012-04-20 VITALS — BP 156/99 | HR 80 | Ht 58.5 in | Wt 217.0 lb

## 2012-04-20 DIAGNOSIS — E119 Type 2 diabetes mellitus without complications: Secondary | ICD-10-CM

## 2012-04-20 DIAGNOSIS — M549 Dorsalgia, unspecified: Secondary | ICD-10-CM | POA: Insufficient documentation

## 2012-04-20 DIAGNOSIS — R300 Dysuria: Secondary | ICD-10-CM

## 2012-04-20 LAB — POCT UA - MICROSCOPIC ONLY

## 2012-04-20 LAB — POCT URINALYSIS DIPSTICK
Glucose, UA: 500
Leukocytes, UA: NEGATIVE
Nitrite, UA: NEGATIVE
Protein, UA: 100
Urobilinogen, UA: 0.2

## 2012-04-20 LAB — POCT GLYCOSYLATED HEMOGLOBIN (HGB A1C): Hemoglobin A1C: 10

## 2012-04-20 MED ORDER — CEPHALEXIN 500 MG PO CAPS: 500.0000 mg | ORAL_CAPSULE | Freq: Three times a day (TID) | ORAL | Status: DC

## 2012-04-20 NOTE — Assessment & Plan Note (Addendum)
Given that dysuria/polyuria started at onset of back pain, concern for UTI.  UA findings consistent with UTI include moderate blood, 1+ bacteria. Urine dilute due to poor DM control.  Treated with Keflex x 7 days. Urine culture ordered.   Additionally, may be MSK component so encouraged heat and tylenol prn. Upper back pain atypical with UTI. Low back pain could be CVA tenderness would indicate more pyelonephritis but afebrile and diffusely tender so do not think this has progressed to kidneys.  Encouraged follow up within 1 week to follow up with PCP about DM and HTN.

## 2012-04-20 NOTE — Patient Instructions (Signed)
I am concerned you may have a urinary tract infection due to the burning with peeing. i want you to take this antibiotic for 7 days. i want you to follow up in 1 weeks with Dr. Mikel Cella to see if your back pain is getting better. It is also possible that you have strained some muscles. You do not need to rest your back. Keep doing your normal routine. Take ibuprofen or tylenol as needed for pain.   Thanks, Dr. Durene Cal   Health Maintenance Due  Topic Date Due  . Ophthalmology Exam  11/15/1984  . Pap Smear  11/15/1992  . Influenza Vaccine  11/13/2011

## 2012-04-20 NOTE — Progress Notes (Signed)
Subjective:   1. Back Pain  Pain rating, quality, Location-upper back on both sides of spine starting about a week ago, now expanded throughout her lower back. Sometimes feels hot and sometimes feels cold. Aching pain. Some pain over CVA.  8-9/10 at peak 7/10 with meds.  Started, duration-1 week, gradually improving Worse with (sitting-related to herniated disc compression)-laying down worse. Better up and moving. Not worse with sitting.  Relieved by-Ibuprofen helping  Previous Treatment-none other than ibuprofen Previous imaging-none  ROS-History negative for trauma, history of cancer, fever, chills, weight loss, recent bacterial infection, recent IV drug use, HIV, pain worse at night, saddle anesthesia, incontinence of urine or stool,  focal neurological deficits including weakness, radiation into legs. Patient does endorse pain with urination and dysuria which started at same time of onset as back pain.    ROS--See HPI. Addition, watery, painful, slightly red bump left eye x 1 week but no blurry vision or trouble seeing (improving-thinks she had a stye).   Past Medical History Patient Active Problem List  Diagnosis  . DIABETES-TYPE 2  . Morbid obesity  . GERD  . Depression  . Hypertension  . Hepatic steatosis   Reviewed problem list.  Medications- reviewed and updated Chief complaint-noted  Objective: BP 156/99  Pulse 80  Ht 4' 10.5" (1.486 m)  Wt 217 lb (98.431 kg)  BMI 44.58 kg/m2 Gen: NAD, resting comfortably on table, does not appear to be in any distress (reports 7/10 pain currently). Morbidly obese.  CV: RRR no murmurs rubs or gallops Lungs: CTAB no crackles, wheeze, rhonchi Skin: warm, dry Neuro: grossly normal, moves all extremities  Back - Normal skin, Spine with normal alignment and no deformity.  No tenderness to vertebral process palpation.  Paraspinous muscles are tender to palpation but patient is diffusely slightly tender throughout back including area of  CVA.   Range of motion is full at neck and lumbar sacral regions. 5/5 muscle strength in upper and lower extremities. Negative straight leg raise. Intact sensation in upper and lower extremities. Negative [place].   Assessment/Plan:

## 2012-04-22 LAB — URINE CULTURE: Colony Count: 75000

## 2012-05-11 ENCOUNTER — Ambulatory Visit: Payer: Self-pay | Admitting: Family Medicine

## 2012-05-14 ENCOUNTER — Ambulatory Visit: Payer: Self-pay | Admitting: Family Medicine

## 2012-06-07 ENCOUNTER — Telehealth: Payer: Self-pay | Admitting: Family Medicine

## 2012-06-07 DIAGNOSIS — E119 Type 2 diabetes mellitus without complications: Secondary | ICD-10-CM

## 2012-06-07 MED ORDER — GLYBURIDE 5 MG PO TABS: 10.0000 mg | ORAL_TABLET | Freq: Two times a day (BID) | ORAL | Status: DC

## 2012-06-07 MED ORDER — METFORMIN HCL 1000 MG PO TABS: 1000.0000 mg | ORAL_TABLET | Freq: Two times a day (BID) | ORAL | Status: DC

## 2012-06-07 NOTE — Telephone Encounter (Signed)
Refills sent. Please let patient know she can pick up her medications. Thanks, [employer]. Nancy Manuele, M.D.

## 2012-06-07 NOTE — Telephone Encounter (Signed)
Patient needs a refill of Metformin (health dept) and Glyburide ([facility]) called in.  She is out.

## 2012-06-08 NOTE — Telephone Encounter (Signed)
Will forward to [employer], as I am fairly sure this pt speaks spanish. Eshal Propps, Maryjo [place]

## 2012-06-08 NOTE — Telephone Encounter (Signed)
Pt is aware about medication refill. I lvm.  [employer]

## 2012-06-11 ENCOUNTER — Encounter (HOSPITAL_COMMUNITY): Payer: Self-pay | Admitting: Emergency Medicine

## 2012-06-11 DIAGNOSIS — I10 Essential (primary) hypertension: Secondary | ICD-10-CM | POA: Insufficient documentation

## 2012-06-11 DIAGNOSIS — E119 Type 2 diabetes mellitus without complications: Secondary | ICD-10-CM | POA: Insufficient documentation

## 2012-06-11 DIAGNOSIS — L089 Local infection of the skin and subcutaneous tissue, unspecified: Secondary | ICD-10-CM | POA: Insufficient documentation

## 2012-06-11 DIAGNOSIS — Z8659 Personal history of other mental and behavioral disorders: Secondary | ICD-10-CM | POA: Insufficient documentation

## 2012-06-11 DIAGNOSIS — N390 Urinary tract infection, site not specified: Secondary | ICD-10-CM | POA: Insufficient documentation

## 2012-06-11 DIAGNOSIS — R21 Rash and other nonspecific skin eruption: Secondary | ICD-10-CM | POA: Insufficient documentation

## 2012-06-11 DIAGNOSIS — Z3202 Encounter for pregnancy test, result negative: Secondary | ICD-10-CM | POA: Insufficient documentation

## 2012-06-11 DIAGNOSIS — Z79899 Other long term (current) drug therapy: Secondary | ICD-10-CM | POA: Insufficient documentation

## 2012-06-11 DIAGNOSIS — Z8719 Personal history of other diseases of the digestive system: Secondary | ICD-10-CM | POA: Insufficient documentation

## 2012-06-11 LAB — CBC
Hemoglobin: 10.1 g/dL — ABNORMAL LOW (ref 12.0–15.0)
MCHC: 31.4 g/dL (ref 30.0–36.0)
Platelets: 286 10*3/uL (ref 150–400)
RBC: 5.05 MIL/uL (ref 3.87–5.11)

## 2012-06-11 LAB — BASIC METABOLIC PANEL
GFR calc non Af Amer: >90 mL/min (ref >90)
Glucose, Bld: 184 mg/dL — ABNORMAL HIGH (ref 70–99)
Potassium: 3.5 mEq/L (ref 3.5–5.1)
Sodium: 134 mEq/L — ABNORMAL LOW (ref 135–145)

## 2012-06-11 LAB — LIPASE, BLOOD: Lipase: 42 U/L (ref 11–59)

## 2012-06-11 NOTE — ED Notes (Signed)
Patient complaining of burning during urination and back pain that started today.  Patient also complaining of abdominal pain.  Denies nausea and vomiting.

## 2012-06-12 ENCOUNTER — Emergency Department (HOSPITAL_COMMUNITY)
Admission: EM | Admit: 2012-06-12 | Discharge: 2012-06-12 | Disposition: A | Discharge status: Home / Self Care | Payer: No Typology Code available for payment source | Attending: Emergency Medicine | Admitting: Emergency Medicine

## 2012-06-12 DIAGNOSIS — R300 Dysuria: Secondary | ICD-10-CM

## 2012-06-12 DIAGNOSIS — L089 Local infection of the skin and subcutaneous tissue, unspecified: Secondary | ICD-10-CM

## 2012-06-12 LAB — URINALYSIS, ROUTINE W REFLEX MICROSCOPIC
Ketones, ur: 15 mg/dL — AB
Leukocytes, UA: NEGATIVE
Nitrite: NEGATIVE
Protein, ur: 30 mg/dL — AB
Urobilinogen, UA: 1 mg/dL (ref 0.0–1.0)
pH: 5.5 (ref 5.0–8.0)

## 2012-06-12 LAB — POCT PREGNANCY, URINE: Preg Test, Ur: NEGATIVE

## 2012-06-12 LAB — URINE MICROSCOPIC-ADD ON

## 2012-06-12 MED ORDER — SULFAMETHOXAZOLE-TRIMETHOPRIM 800-160 MG PO TABS: 1.0000 | ORAL_TABLET | Freq: Two times a day (BID) | ORAL | Status: DC

## 2012-06-12 MED ORDER — IBUPROFEN 800 MG PO TABS: ORAL_TABLET | ORAL | Status: DC

## 2012-06-12 NOTE — ED Notes (Signed)
Pt's blood pressure cuff is malpositioned on arm. Pt keeps moving it down to her forearm

## 2012-06-12 NOTE — ED Notes (Signed)
RN conducted appropriate assessments related to patients symptoms while on the phone with the spanish interpreter.

## 2012-06-12 NOTE — ED Provider Notes (Signed)
History     CSN: [id number]  Arrival date & time 06/11/12  2148   First MD Initiated Contact with Patient 06/12/12 0022      Chief Complaint  Patient presents with  . Urinary Tract Infection    (Consider location/radiation/quality/duration/timing/severity/associated sxs/prior treatment) Patient is a 37 y.o. female presenting with urinary tract infection. The history is provided by the patient (the pt complains of dysuria.  also skin lesion on chin). No language interpreter was used.  Urinary Tract Infection This is a new problem. The current episode started yesterday. The problem occurs constantly. The problem has not changed since onset.Pertinent negatives include no chest pain, no abdominal pain and no headaches. Nothing aggravates the symptoms. Nothing relieves the symptoms.    Past Medical History  Diagnosis Date  . Hypertension   . Depression   . Diabetes mellitus   . GERD (gastroesophageal reflux disease)     Past Surgical History  Procedure Laterality Date  . Cesarean section    . Tubal ligation      Family History  Problem Relation Age of Onset  . Hypertension Mother   . Diabetes Father     History  Substance Use Topics  . Smoking status: Never Smoker   . Smokeless tobacco: Never Used  . Alcohol Use: No    OB History   Grav Para Term Preterm Abortions TAB SAB Ect Mult Living   3 3 3       3       Review of Systems  Constitutional: Negative for fatigue.  HENT: Negative for congestion, sinus pressure and ear discharge.   Eyes: Negative for discharge.  Respiratory: Negative for cough.   Cardiovascular: Negative for chest pain.  Gastrointestinal: Negative for abdominal pain and diarrhea.  Genitourinary: Positive for dysuria. Negative for frequency and hematuria.  Musculoskeletal: Negative for back pain.  Skin: Positive for rash.  Neurological: Negative for seizures and headaches.  Psychiatric/Behavioral: Negative for hallucinations.    Allergies   Review of patient's allergies indicates no known allergies.  Home Medications   Current Outpatient Rx  Name  Route  Sig  Dispense  Refill  . glyBURIDE (DIABETA) 5 MG tablet   Oral   Take 2 tablets (10 mg total) by mouth 2 (two) times daily with a meal.   120 tablet   11   . metFORMIN (GLUCOPHAGE) 1000 MG tablet   Oral   Take 1 tablet (1,000 mg total) by mouth 2 (two) times daily with a meal.   180 tablet   3   . ibuprofen (ADVIL,MOTRIN) 800 MG tablet      Take one every 8 hours for pain   21 tablet   0   . sulfamethoxazole-trimethoprim (BACTRIM DS,SEPTRA DS) 800-160 MG per tablet   Oral   Take 1 tablet by mouth 2 (two) times daily. One po bid x 3 days   6 tablet   0     BP 79/58  Pulse 68  Temp(Src) 98.2 F (36.8 C) (Oral)  Resp 18  SpO2 100%  LMP 05/28/2012  Physical Exam  Constitutional: She is oriented to person, place, and time. She appears well-developed.  HENT:  Head: Normocephalic and atraumatic.  Eyes: Conjunctivae and EOM are normal. No scleral icterus.  Neck: Neck supple. No thyromegaly present.  Cardiovascular: Normal rate and regular rhythm.  Exam reveals no gallop and no friction rub.   No murmur heard. Pulmonary/Chest: No stridor. She has no wheezes. She has no rales. She exhibits  no tenderness.  Abdominal: She exhibits no distension. There is no tenderness. There is no rebound.  Musculoskeletal: Normal range of motion. She exhibits no edema.  Lymphadenopathy:    She has no cervical adenopathy.  Neurological: She is oriented to person, place, and time. Coordination normal.  Skin: Rash noted. No erythema.  Pt has a rash to her chin.  Possibly bacterial infection  Psychiatric: She has a normal mood and affect. Her behavior is normal.    ED Course  Procedures (including critical care time)  Labs Reviewed  CBC - Abnormal; Notable for the following:    Hemoglobin 10.1 (*)    HCT 32.2 (*)    MCV 63.8 (*)    MCH 20.0 (*)    RDW 16.3 (*)     All other components within normal limits  BASIC METABOLIC PANEL - Abnormal; Notable for the following:    Sodium 134 (*)    Glucose, Bld 184 (*)    Creatinine, Ser 0.47 (*)    All other components within normal limits  URINALYSIS, ROUTINE W REFLEX MICROSCOPIC - Abnormal; Notable for the following:    APPearance HAZY (*)    Glucose, UA 500 (*)    Ketones, ur 15 (*)    Protein, ur 30 (*)    All other components within normal limits  LIPASE, BLOOD  URINE MICROSCOPIC-ADD ON  URINALYSIS, ROUTINE W REFLEX MICROSCOPIC  POCT PREGNANCY, URINE   No results found.   1. Dysuria   2. Skin infection       MDM          Benny Lennert, MD 06/12/12 [id number]

## 2012-06-12 NOTE — ED Notes (Signed)
RN called cab for pt.

## 2012-06-12 NOTE — ED Notes (Signed)
Patient did give urine sample, but it was not enough to complete tests ordered. RN instructed pt that we would have to perform an in and out catheter. Pt informed RN that she would like a female to perform the procedure.

## 2012-06-12 NOTE — ED Notes (Signed)
MD at bedside. 

## 2012-06-12 NOTE — ED Notes (Signed)
Pt will need interpreter services. Pt is spanish speaking. Pt is here alone and can speak some english but not enough to explain her symptoms.

## 2012-06-20 ENCOUNTER — Ambulatory Visit (INDEPENDENT_AMBULATORY_CARE_PROVIDER_SITE_OTHER): Payer: No Typology Code available for payment source | Admitting: Family Medicine

## 2012-06-20 ENCOUNTER — Encounter: Payer: Self-pay | Admitting: Family Medicine

## 2012-06-20 VITALS — BP 99/67 | HR 78 | Temp 97.9°F | Ht <= 58 in | Wt 219.0 lb

## 2012-06-20 DIAGNOSIS — E119 Type 2 diabetes mellitus without complications: Secondary | ICD-10-CM

## 2012-06-20 DIAGNOSIS — F3289 Other specified depressive episodes: Secondary | ICD-10-CM

## 2012-06-20 DIAGNOSIS — F32A Depression, unspecified: Secondary | ICD-10-CM

## 2012-06-20 DIAGNOSIS — R300 Dysuria: Secondary | ICD-10-CM

## 2012-06-20 DIAGNOSIS — F329 Major depressive disorder, single episode, unspecified: Secondary | ICD-10-CM

## 2012-06-20 DIAGNOSIS — R1084 Generalized abdominal pain: Secondary | ICD-10-CM

## 2012-06-20 DIAGNOSIS — M25569 Pain in unspecified knee: Secondary | ICD-10-CM

## 2012-06-20 LAB — POCT URINALYSIS DIPSTICK
Bilirubin, UA: NEGATIVE
Glucose, UA: NEGATIVE
Ketones, UA: NEGATIVE
Spec Grav, UA: 1.025

## 2012-06-20 LAB — POCT UA - MICROSCOPIC ONLY

## 2012-06-20 MED ORDER — OXYBUTYNIN CHLORIDE 5 MG PO TABS: 5.0000 mg | ORAL_TABLET | Freq: Two times a day (BID) | ORAL | Status: DC

## 2012-06-20 MED ORDER — MELOXICAM 7.5 MG PO TABS: 7.5000 mg | ORAL_TABLET | Freq: Every day | ORAL | Status: DC

## 2012-06-20 MED ORDER — GLYBURIDE 5 MG PO TABS: 10.0000 mg | ORAL_TABLET | Freq: Two times a day (BID) | ORAL | Status: DC

## 2012-06-20 MED ORDER — METFORMIN HCL 1000 MG PO TABS: 1000.0000 mg | ORAL_TABLET | Freq: Two times a day (BID) | ORAL | Status: DC

## 2012-06-20 NOTE — Patient Instructions (Signed)
I will call you if your urine shows an infection.  2 new medications:  1. Mobic- For pain once daily. Stop taking Advil 2. Ditropan- Twice daily to help hold your urine.  Please make an appointment soon to come back to see me. Nargis Abrams M. Zayda Angell, M.D.

## 2012-06-20 NOTE — Assessment & Plan Note (Signed)
Likely multifactorial. Could be peripheral neuropathy due to her uncontrolled DM, but could also be osteoarthritis from normal wear and tear as well as obesity. Encouraged to lose weight and stretch. Also given Rx for Mobic to take daily for pain as needed. Pt agrees with this plan.

## 2012-06-20 NOTE — Assessment & Plan Note (Signed)
Will continue Glyburide and Metformin today. Refills sent. A1C was >10, therefore she was sent to [name] for [facility] documentation. RTC in 1-2 months for follow up. Continue to check blood sugar at home, and discussed symptomatic lows.

## 2012-06-20 NOTE — Progress Notes (Signed)
Patient ID: [name], female   DOB: 1975-08-21, 37 y.o.   MRN: [id number]  [name] [name] M. Mary Starkes, MD Phone: [phone]   Subjective: HPI: Patient is a 37 y.o. female presenting to clinic today for follow up appointment. Interpreter [name] from [place] was present throughout entire history and exam.  Concerns today include DM and hand/foot pain.  1. Diabetes:  High at home: 120 Low at home: When she was sick a few weeks ago it was less than 99. Felt dizzy and ate and blood sugar came up Taking medications: Yes, Metformin and Glyburide Side effects: None ROS: denies fever, chills, dizziness, LOC, polyuria, polydipsia, numbness or tingling in extremities or chest pain. Patient did see [name] today for [facility].  2. Pain: Has throbbing pain in feet, ankles and knees. Worse with walking. Has numbness and tingling. Taking Advil for the pain and it helps some. This has been going on for a while.  3. UTI: Diagnosed in ED on 4/1 as well as possible skin infection on her chin. Took Bactrim (as well as Keflex one month ago). Continues to have dysuria, frequency, pain in lower abdomen and peri-umbilical. She also has incontinence of urine, especially on public transportation. She has to wear a pad. (This may or may not be related to pain.) Incontinence has been going on for more than one year.   4. Depression:  Was able to get in touch with [name] of [place]. She has been busy with family issues and has not been able to go but she is aware of the location and services offered.  History Reviewed: Non smoker. Health Maintenance: UTD for age  ROS: Please see HPI above.  Objective: Office vital signs reviewed. BP 99/67  Pulse 78  Temp(Src) 97.9 F (36.6 C) (Oral)  Ht 4\' 10"  (1.473 m)  Wt 219 lb (99.338 kg)  BMI 45.78 kg/m2  LMP 06/17/2012  Physical Examination:  General: Awake, alert. NAD.  HEENT: Atraumatic, normocephalic.  MMM. Poor dental hygiene  Neck: No masses palpated. No LAD Pulm: CTAB, no wheezes Cardio: RRR, no murmurs appreciated Abdomen:obese, soft. TTP lower abdomen/suprapubic. Extremities: No edema. TTP of knees and ankles. Foot exam performed without lesions or calluses. 2+ pulses. Sensation grossly intact.  Neuro: Grossly intact  Assessment: 37 y.o. female follow up appointment  Plan: See Problem List and After Visit Summary

## 2012-06-20 NOTE — Assessment & Plan Note (Signed)
Not fully discussed today. 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 Also, I feel like her mental health can be playing a large role on her physical complaints.

## 2012-06-20 NOTE — Assessment & Plan Note (Signed)
Treated for multiple UTI in past, pain is persistent. Will get repeat UA today. She also complains of incontinence, most likely overflow. Will start on Ditropan and re-evaluate in one month.

## 2012-06-27 ENCOUNTER — Encounter: Payer: Self-pay | Admitting: *Deleted

## 2012-06-27 DIAGNOSIS — E119 Type 2 diabetes mellitus without complications: Secondary | ICD-10-CM

## 2012-07-17 ENCOUNTER — Ambulatory Visit: Payer: No Typology Code available for payment source | Admitting: Family Medicine

## 2012-07-24 ENCOUNTER — Encounter: Payer: Self-pay | Admitting: Family Medicine

## 2012-07-24 ENCOUNTER — Ambulatory Visit (INDEPENDENT_AMBULATORY_CARE_PROVIDER_SITE_OTHER): Payer: No Typology Code available for payment source | Admitting: Family Medicine

## 2012-07-24 VITALS — BP 113/79 | HR 85 | Temp 99.5°F | Ht 58.5 in | Wt 220.0 lb

## 2012-07-24 DIAGNOSIS — R300 Dysuria: Secondary | ICD-10-CM

## 2012-07-24 DIAGNOSIS — J309 Allergic rhinitis, unspecified: Secondary | ICD-10-CM

## 2012-07-24 DIAGNOSIS — E119 Type 2 diabetes mellitus without complications: Secondary | ICD-10-CM

## 2012-07-24 DIAGNOSIS — J302 Other seasonal allergic rhinitis: Secondary | ICD-10-CM

## 2012-07-24 LAB — POCT URINALYSIS DIPSTICK
Nitrite, UA: NEGATIVE
Spec Grav, UA: 1.025
Urobilinogen, UA: 0.2
pH, UA: 6

## 2012-07-24 LAB — POCT UA - MICROSCOPIC ONLY

## 2012-07-24 LAB — POCT GLYCOSYLATED HEMOGLOBIN (HGB A1C): Hemoglobin A1C: 7.6

## 2012-07-24 MED ORDER — METFORMIN HCL 1000 MG PO TABS: 1000.0000 mg | ORAL_TABLET | Freq: Two times a day (BID) | ORAL | Status: DC

## 2012-07-24 MED ORDER — GLYBURIDE 5 MG PO TABS: 10.0000 mg | ORAL_TABLET | Freq: Two times a day (BID) | ORAL | Status: DC

## 2012-07-24 MED ORDER — OXYBUTYNIN CHLORIDE 5 MG PO TABS: 5.0000 mg | ORAL_TABLET | Freq: Two times a day (BID) | ORAL | Status: DC

## 2012-07-24 MED ORDER — CETIRIZINE HCL 10 MG PO TABS: 10.0000 mg | ORAL_TABLET | Freq: Every day | ORAL | Status: DC

## 2012-07-24 NOTE — Patient Instructions (Signed)
It was good to see you today!  We will call you with the results from your urine.  For your throat, start taking the Zyrtec. For your Diabetes, keep taking your medications. For your urine, keep taking the medications and I will call you with the results.  Caelen Reierson M. Kyleigha Markert, M.D. 07/24/2012

## 2012-07-24 NOTE — Progress Notes (Signed)
Patient ID: [name], female   DOB: June 06, 1975, 37 y.o.   MRN: [id number]  [facility] Mary Eustice M. Janasha Barkalow, MD Phone: [phone]   Subjective: HPI: Patient is a 37 y.o. female presenting to clinic today for follow up appointment. Concerns today: Sore throat, DM, and continued urinary problems. Phone interpreter used for entire visit.  1. Diabetes:  High at home: 140 Low at home: 24 Taking medications: Metformin and Glyburide Side effects: None ROS: denies fever, chills, dizziness, LOC, or chest pain. Endorses dysuria and numbness/tingling in hands and feet. Last eye exam: April 2014 Last foot exam: Unsure Nephropathy screen indicated?: No, last Cr in March   2. Sore throat - 3 weeks of hoarseness, unchanged. Sometimes she loses voice and sometimes it feels better. It is better with hot water gargles. No history of seasonal allergies. Runny nose and watery eyes. Has to clear throat.  3. Urinary incontinence with dysuria- Started on Ditropan and helped a little bit but running out. Still has frequency but able to make it to the bathroom better. Continues to have pain with letting the urine go. This has been going on for a while with negative UA  History Reviewed: Nonsmoker.  ROS: Please see HPI above.  Objective: Office vital signs reviewed. BP 113/79  Pulse 85  Temp(Src) 99.5 F (37.5 C) (Oral)  Ht 4' 10.5" (1.486 m)  Wt 220 lb (99.791 kg)  BMI 45.19 kg/m2  Physical Examination:  General: Awake, alert. NAD. HEENT: Atraumatic, normocephalic. MMM Pulm: CTAB, no wheezes Cardio: RRR, no murmurs appreciated Abdomen: Obese, +BS, soft. TTP epigastric area.  Extremities: Trace edema, no TTP.  Neuro: Grossly intact   Assessment: 37 y.o. female follow up appointment  Plan: See Problem List and After Visit Summary

## 2012-07-26 DIAGNOSIS — J302 Other seasonal allergic rhinitis: Secondary | ICD-10-CM | POA: Insufficient documentation

## 2012-07-26 DIAGNOSIS — R300 Dysuria: Secondary | ICD-10-CM | POA: Insufficient documentation

## 2012-07-26 NOTE — Assessment & Plan Note (Signed)
A1C improved. Pt congratulated. Continue current medications. F/u in 3 months.

## 2012-07-26 NOTE — Assessment & Plan Note (Signed)
Ongoing problem without clear cause. Continue ditropan for now. UA without UTI in clinic. Consider starting SSRI or refer to urology if problem persists.

## 2012-07-26 NOTE — Assessment & Plan Note (Signed)
Sore throat most likely related to seasonal allergies. Given Rx for Zyrtec to try at night for symptoms. Continue other symptomatic treatment.

## 2012-08-21 ENCOUNTER — Encounter: Payer: Self-pay | Admitting: Family Medicine

## 2012-08-21 ENCOUNTER — Ambulatory Visit (INDEPENDENT_AMBULATORY_CARE_PROVIDER_SITE_OTHER): Payer: No Typology Code available for payment source | Admitting: Family Medicine

## 2012-08-21 VITALS — BP 106/75 | HR 84 | Temp 97.8°F | Wt 220.0 lb

## 2012-08-21 DIAGNOSIS — R350 Frequency of micturition: Secondary | ICD-10-CM

## 2012-08-21 DIAGNOSIS — R209 Unspecified disturbances of skin sensation: Secondary | ICD-10-CM

## 2012-08-21 DIAGNOSIS — R300 Dysuria: Secondary | ICD-10-CM

## 2012-08-21 DIAGNOSIS — R200 Anesthesia of skin: Secondary | ICD-10-CM

## 2012-08-21 DIAGNOSIS — M549 Dorsalgia, unspecified: Secondary | ICD-10-CM

## 2012-08-21 DIAGNOSIS — M79609 Pain in unspecified limb: Secondary | ICD-10-CM

## 2012-08-21 DIAGNOSIS — M79661 Pain in right lower leg: Secondary | ICD-10-CM

## 2012-08-21 DIAGNOSIS — M79662 Pain in left lower leg: Secondary | ICD-10-CM | POA: Insufficient documentation

## 2012-08-21 LAB — POCT UA - MICROSCOPIC ONLY

## 2012-08-21 LAB — POCT URINALYSIS DIPSTICK
Glucose, UA: 100
Leukocytes, UA: NEGATIVE
Protein, UA: 30
Spec Grav, UA: 1.025
Urobilinogen, UA: 0.2

## 2012-08-21 MED ORDER — FLUOXETINE HCL 20 MG PO TABS: 20.0000 mg | ORAL_TABLET | Freq: Every day | ORAL | Status: DC

## 2012-08-21 NOTE — Assessment & Plan Note (Signed)
History concerning for PAD given diabetes and pain with walking fast. Will send to Dr. Raymondo Band for ABI to assess her blood flow. Patient agrees with this plan.

## 2012-08-21 NOTE — Assessment & Plan Note (Signed)
No UTI on UA today. Most likely chronic cystitis. Discussed options with patient. For now, will start with low dose SSRI to see if that will help with the bladder spasm. RTC in 4-6 weeks for re-evaluation. She may benefit from referral to urology and/or pelvic ultrasound.

## 2012-08-21 NOTE — Progress Notes (Signed)
Patient ID: Sitlali Koerner, female   DOB: 1975/10/02, 37 y.o.   MRN: [id number]  [facility] Chanetta Moosman M. Ashlley Booher, MD Phone: [phone]   Subjective: HPI: Patient is a 36 y.o. female presenting to clinic today for follow up appointment. Graciella present as interpreter through entire history and physical. Concerns today include urinary pain, lower back pain, calf pain, face numbness  1. Urinary discomfort- Feels pain every time she goes to the restroom. She is on Ditropan which helps some. UA at every checked visit is sterile. Does not notice any blood. No fevers, no flank pain.  2. Back pain- Reports back pain bilaterally. Comes and goes, sometimes worse with activity but not always. Sometimes she lays down, usually goes away on its own. Meloxicam helps some.  3. Calf pain- Notices when walking. States she can walk 10-15 minutes before pain starts. The faster she walks, the worse the pain. Goes away with rest. States she uses public transportation so when she walks fast to catch the bus she has more pain. Been going on for 2-3 months, not worsening.  4. Face numbness- She was feeling a cold/warm sensation on the right side of her head. Throbbing on right side. Notices it more when she is stressed, afraid or nervous she feels the throbbing more.   History Reviewed: Never smoker. Health Maintenance: Needs labs  ROS: Please see HPI above.  Objective: Office vital signs reviewed. BP 106/75  Pulse 84  Temp(Src) 97.8 F (36.6 C) (Oral)  Wt 220 lb (99.791 kg)  BMI 45.19 kg/m2  Physical Examination:  General: Awake, alert. NAD.  HEENT: Atraumatic, normocephalic. Missing teeth Pulm: CTAB, no wheezes Cardio: RRR, no murmurs appreciated Abdomen: obese, soft,  Nondistended. TTP suprapubic area Back: lower thoracic paraspinal tenderness Extremities: No edema. No calf tenderness on palpation. Neuro: Grossly intact  Assessment: 37 y.o. female follow up  appointment  Plan: See Problem List and After Visit Summary

## 2012-08-21 NOTE — Assessment & Plan Note (Signed)
Likely secondary to over use and obesity. Encouraged to rest and stretch her back. Use Mobic prn for pain.

## 2012-08-21 NOTE — Assessment & Plan Note (Signed)
Most likely somatic representation of anxiety. No red flags on exam today. Continue to monitor. Hopefully the SSRI will help this as well.

## 2012-08-21 NOTE — Progress Notes (Signed)
Interpreter Terilyn Sano Namihira for Dr Hairford 

## 2012-08-21 NOTE — Patient Instructions (Addendum)
Make an appointment with Dr. Raymondo Band for ABI.  Start taking the Prozac daily.  Come back to see me in 4-6 weeks.  Foday Cone M. Drexler Maland, M.D.

## 2012-08-28 ENCOUNTER — Encounter: Payer: Self-pay | Admitting: Pharmacist

## 2012-08-28 ENCOUNTER — Ambulatory Visit (INDEPENDENT_AMBULATORY_CARE_PROVIDER_SITE_OTHER): Payer: No Typology Code available for payment source | Admitting: Pharmacist

## 2012-08-28 VITALS — BP 118/47 | HR 72 | Ht 59.0 in | Wt 220.0 lb

## 2012-08-28 DIAGNOSIS — M79661 Pain in right lower leg: Secondary | ICD-10-CM

## 2012-08-28 DIAGNOSIS — M79609 Pain in unspecified limb: Secondary | ICD-10-CM

## 2012-08-28 DIAGNOSIS — M25569 Pain in unspecified knee: Secondary | ICD-10-CM

## 2012-08-28 NOTE — Assessment & Plan Note (Signed)
Normal ABI and low likelihood of PAD based on ABI of 1.05 in a patient with symptoms of bilateral leg pain when walking, especially quickly or uphill.  No change in Tx recommended at this time. Results reviewed and written information provided.   F/U Clinic Visit with Dr. Hairford vin July for further work-up evaluation.  Total time in face-to-face counseling 30 minutes.  Patient seen with Lauren Bajbus, PharmD Resident 

## 2012-08-28 NOTE — Patient Instructions (Signed)
Thank you for coming in today! The results of the test shows that the blood flow in your legs is normal. Follow up with Dr. Mikel Cella in early July to keep working on reasons for your leg pain.   Gracias por venir! Los resultados de la prueba muestra que el flujo sanguneo en las piernas es normal. Seguimiento con el Dr. Mikel Cella a principios de julio para seguir trabajando sobre las razones de su dolor en la pierna.

## 2012-08-28 NOTE — Assessment & Plan Note (Signed)
Normal ABI and low likelihood of PAD based on ABI of 1.05 in a patient with symptoms of bilateral leg pain when walking, especially quickly or uphill.  No change in Tx recommended at this time. Results reviewed and written information provided.   F/U Clinic Visit with Dr. Clancy Gourd July for further work-up evaluation.  Total time in face-to-face counseling 30 minutes.  Patient seen with Brigid Re, PharmD Resident

## 2012-08-28 NOTE — Progress Notes (Signed)
  Subjective:    Patient ID: [name], female    DOB: [birth year], 37 y.o.   MRN: [id number]  HPI  Patient arrives alone and Graciela then arrives to help with interpretation.  She reports pain with walking.  Pain is described as burning, throbbing, stabbing, shooting which occurs after 10 minutes of exercise. Reports pain worsens when walking up hill or in a hurry. Reports pain when walking at an ordinary pace on a level surface   Reports pain resolves on sitting.  Pain is localized to calves and some into thighs, but is the same in both legs.  Review of Systems     Objective:   Physical Exam    Lower extremity Physical Exam includes thickened toenails, dry skin  bilaterally.  ABI overall = 1.05. Right Arm 100 mmHg    Left Arm 102 mmHg Right ankle posterior tibial 138 mmHg     dorsalis pedis 118 mmHg Left ankle posterior tibial 108 mmHg    dorsalis pedis 96 mmHg    Assessment & Plan:   Normal ABI and low likelihood of PAD based on ABI of 1.05 in a patient with symptoms of bilateral leg pain when walking, especially quickly or uphill.  No change in Tx recommended at this time. Results reviewed and written information provided.   F/U Clinic Visit with Dr. Clancy Gourd July for further work-up evaluation.  Total time in face-to-face counseling 30 minutes.  Patient seen with Brigid Re, PharmD Resident

## 2012-09-04 NOTE — Progress Notes (Signed)
Patient ID: [name], female   DOB: [birth year], 37 y.o.   MRN: [id number] Reviewed: Agree with Dr. Macky Lower documentation and management.

## 2012-09-13 ENCOUNTER — Encounter: Payer: Self-pay | Admitting: Family Medicine

## 2012-09-13 ENCOUNTER — Ambulatory Visit (INDEPENDENT_AMBULATORY_CARE_PROVIDER_SITE_OTHER): Payer: No Typology Code available for payment source | Admitting: Family Medicine

## 2012-09-13 VITALS — BP 126/85 | HR 107 | Temp 99.7°F | Ht 59.0 in | Wt 209.0 lb

## 2012-09-13 DIAGNOSIS — J029 Acute pharyngitis, unspecified: Secondary | ICD-10-CM

## 2012-09-13 DIAGNOSIS — R1013 Epigastric pain: Secondary | ICD-10-CM

## 2012-09-13 LAB — POCT H PYLORI SCREEN: H Pylori Screen, POC: POSITIVE

## 2012-09-13 MED ORDER — CLARITHROMYCIN 500 MG PO TABS: 500.0000 mg | ORAL_TABLET | Freq: Two times a day (BID) | ORAL | Status: DC

## 2012-09-13 MED ORDER — OMEPRAZOLE 20 MG PO CPDR: 20.0000 mg | DELAYED_RELEASE_CAPSULE | Freq: Two times a day (BID) | ORAL | Status: DC

## 2012-09-13 MED ORDER — ALUM & MAG HYDROXIDE-SIMETH 200-200-20 MG/5ML PO SUSP: 5.0000 mL | Freq: Four times a day (QID) | ORAL | Status: DC | PRN

## 2012-09-13 MED ORDER — AMOXICILLIN 500 MG PO CAPS: 1000.0000 mg | ORAL_CAPSULE | Freq: Two times a day (BID) | ORAL | Status: DC

## 2012-09-13 MED ORDER — FAMOTIDINE 20 MG PO TABS: 20.0000 mg | ORAL_TABLET | Freq: Two times a day (BID) | ORAL | Status: DC

## 2012-09-13 NOTE — Patient Instructions (Addendum)
[name], It was nice to meet you today. You were seen in clinic for your sore stomach and sore throat. There can be many causes of these types of pain, including irritation from medication, infection from bacteria or virus or reflux of the acid in your stomach.You were diagnosed with strep throat and an hpylori infection today 1. I would like you to start taking omeprazole, 2 capsules per day to help control the acid in your stomach 2. You will also 2 capsules of amoxicillin per day and 2 tabs of clarithromycin per day as prescribed this regimen will help treat your hpylori and your strep throat 3. Please continue to take as much fluids as you can, as dehydration can make you feel ill or dizzy 4. Please avoids NSAIDs for the pain as these pills can irritate your stomach 5. RTC for a f/u appointment with your PCP Dr. Ronni Rumble por Helicobacter Pylori y lcera (Helicobacter Pylori and Ulcer Disease) Es posible que exista una lcera en su estmago (lcera gstrica) o en la primera parte del intestino delgado, lo que se denomina el duodeno (lcera duodenal). Una lcera es una ruptura en el recubrimiento del estmago o del duodeno. La ruptura avanza hacia el tejido ms profundo. El Helicobacter pylori (H. Pylori) es un tipo de germen (bacteria) responsable de la mayora de las lceras gstricas o duodenales. CAUSAS  Un germen (bacteria). El H. pylori puede debilitar la mucosa protectora que cubre el estmago y [place]. Esto permite que ingrese cido en el recubrimiento sensible del estmago o duodeno y entonces puede formarse una lcera.  Ciertos medicamentos.  Utilizan sustancias que puedan irritar el recubrimiento del estmago (alcohol, tabaco, o medicamentos tales como Advil o Motrin) en la presencia de una infeccin por H. pylori. Esto puede aumentar las probabilidades de tener una lcera.  Cncer (poco frecuente). La mayor parte de las personas infectadas con H. pylori no tienen  lceras. No se sabe de que [employer] se contagian el H. pylori. Podra ser a travs de alimentos o del agua. El H. pylori se ha hallado en la saliva de algunas personas infectadas. Por lo tanto, es posible que la bacteria tambin se contagie a travs del contacto boca a boca, tal como al besar. SNTOMAS Los problemas (sntomas) de las lceras normalmente son:  Ardor persistente en la parte superior del vientre (abdomen). A menudo esto empeora si se tiene el estmago vaco. Puede mejorar consumiendo alimentos. Puede estar asociado con sentir ganas de vomitar (nuseas), hinchazn y vmitos.  Si la lcera resulta en sangrado, puede causar:  Materia fecal de color negro alquitranado.  Vmitos de sangre roja brillante  Vmito de una sustancia similar a la borra del caf. Si la hemorragia es grave, puede haber prdida de la conciencia y shock. Adems de lceras, el H. pylori tambin puede causar gastritis crnica (irritacin del recubrimiento del estmago, sin lcera) o un Programme researcher, broadcasting/film/video cido. Es posible que no tenga sntomas aunque tenga una infeccin por H. pylori. Aunque se trata de una infeccin, es posible que no tenga los sntomas comunes de una infeccin (tal como la [place]). DIAGNSTICO Las lceras se pueden diagnosticar de Environmental consultant. Si tiene [place], es importante que sepa si fue causada o no por H. pylori. El tratamiento para una lcera causada por el H. pylori es diferente al tratamiento para una lcera producida por otras causas. La mejor forma de Engineer, manufacturing H. pylori es obtener tejido directamente de la lcera durante un examen  endoscpico.   Neomia Dear endoscopa es un examen en el que se utiliza un endoscopio. Un endoscopio es un tubo fino que [place] y tiene una pequea cmara en el extremo. Es como un telescopio flexible. Se le dan drogas al paciente para qu est ms calmo (sedante). El profesional introduce el endoscopio por la boca, y lo hace bajar hacia el  estmago y [place]. Esto le permite al mdico observar el tejido que cubre el esfago, el [place] y [place].  Si no es necesario realizar una endoscopa, entonces l H. pylori puede detectarse con exmenes de sangre, de materia fecal, o incluso de aliento. TRATAMIENTO  El tratamiento de una lcera pptica por H. pylori normalmente incluye una combinacin de:  Medicamentos que destruyen grmenes (antibiticos).  cido-supresores.  Protectores de [employer].  No se recomienda el uso de un solo medicamento para tratar el H. Pylori. La forma ms eficaz de tratar el problema consiste en administrar durante 2 semanas lo que se conoce como terapia triple. Esta incluye el uso de dos antibiticos para Wellsite geologist las bacterias y un supresor de la secrecin de cido, o un protector del revestimiento gstrico. La terapia triple administrada durante dos semanas reduce los sntomas de la [place], destruye las bacterias y previene que se vuelvan a formar lceras en muchos pacientes.  Desafortunadamente, a las Optometrist complicado porque exige tomar hasta 20 pastillas al [employer], los antibiticos que se utilizan para la terapia triple pueden causar leves efectos secundarios. [address], se incluyen nuseas, vmitos, diarrea, heces de color oscuro, sabor metlico, [place], Jazelyn de [place] e infecciones por levaduras en las mujeres. Consulte con el profesional que lo asiste si tiene alguno de [employer]. INSTRUCCIONES PARA EL CUIDADO DOMICILIARIO  Tome los medicamentos segn las indicaciones y por todo el tiempo en que se los hayan prescripto. Comuniquese con profesional que lo asiste si tiene problemas o sufre efectos adversos debido a los medicamentos.  Contine con Brenton Grills y actividades habituales a menos que el profesional que lo asiste le aconseje otra cosa.  Evite el tabaco, el alcohol, y la cafena. El tabaco disminuir la velocidad de curacin.  Evite los medicamentos que  puedan ser nocivos. Entre ellos se incluye la aspirina y los AINES como el ibuprofeno y el naproxeno.  Evite aquellos elementos que Naval architect su estado o Forensic scientist.  Hay disponibles muchos medicamentos de venta libre con los que se puede controlar el cido estomacal y otros sntomas. Converse con el profesional que lo asiste antes de utilizarlos. No cambie los medicamentos de prescripcin por medicamentos de venta libre sin hablarlo con el profesional.  Generalmente no es necesario llevar dietas especiales.  Cumpla con las citas y exmenes de sangre tal como se le indic. SOLICITE ATENCIN MDICA SI:  El dolor u otros sntomas de la lcera no mejoran luego de [address] de iniciado el [place].  Presenta diarrea. Este problema puede estar relacionado con algunos tratamientos.  Tiene indigestin o acidez gstrica continua an cuando los principales sntomas de la lcera hayan mejorado.  Cree que tiene cualquier efecto secundario de los medicamentos o si no comprende cmo Chemical engineer sus medicamentos correctamente. SOLICITE ATENCIN MDICA DE INMEDIATO SI: Reece Agar lo siguiente:  Desarrolla una hemorragia rectal con sangre de color rojo brillante.  Tiene deposiciones de color negro alquitranado.  Vomita sangre.  Se siente mareado, dbil, tiene episodios de [place], [place] y siente fro.  Experimenta un dolor abdominal intenso que no puede controlar con los [employer].  No tome medicamentos para el dolor a menos que se lo haya indicado el profesional que lo asiste. EST SEGURO QUE:   Comprende las instrucciones para el alta mdica.  Controlar su enfermedad.  Solicitar atencin mdica de inmediato segn las indicaciones. Document Released: 02/28/2005 Document Revised: 05/23/2011 [facility] Patient Information 2013 [place], [place].

## 2012-09-13 NOTE — Progress Notes (Signed)
Subjective:     Patient ID: [name], female   DOB: 1975-05-03, 37 y.o.   MRN: [id number]  HPI The patient is a 37 y/o W with history of body aches, anxiety and DMII who presents for abdominal discomfort and sore throat. Abdominal pain is diffuse non radiating 8/10 in severity. Normal bowel movements, no difficulty with urination. Monday patient lost her appetite, no precipitating factors no recent sick contacts no ingestion of undercooked food. Since that time she has been experiencing shakes, chills and dizziness due to poor PO intake. Afebrile. She has noticed some thin streaks of blood in her saliva and feels irritation in her throat. Of note she has been taking NSAIDs regularly for her bone pain and attests to taking up to 8 200mg  ibuprofen per day. She denies any other changes to her medical history. She has been taking her medications as prescribed.   Review of Systems ROS   As noted in the HPI. Otherwise negative ROS Objective:   Physical Exam  HENT:  Head: Normocephalic and atraumatic.  Mouth/Throat: Mucous membranes are dry. Posterior oropharyngeal erythema present.  Neck: Neck supple. Muscular tenderness present.  Cardiovascular: Regular rhythm, S1 normal and S2 normal.   Abdominal: She exhibits no distension and no fluid wave. There is generalized tenderness. There is no rebound and no CVA tenderness.  Lymphadenopathy:    She has no cervical adenopathy.   Filed Vitals:   09/13/12 1530  BP: 126/85  Pulse: 107  Temp: 99.7 F (37.6 C)    Hpylori-post Rapid strep-post Glucose- 177    Assessment:     The patient is a 36y/o F with history of DMII, body aches and long term NSAID use. POC tests for Hpylori and Rapid Strep were positive    Plan:     1. Abdominal Pain- POC Hpylori test positive -triple therapy for 14 days -avoid NSAIDs as this could irritate underlying infection/increase gastritis   2. Sore throat- rapid strep positive -abx coverage with  amoxicillin (in triple therapy) -drink plenty of liquids to avoid dehyration    3. Elevated glucose -admits to drinking juices and pediasure bc of poor po intake -last ha1c 7.6, will f/u with PCP Dr. Mikel Cella for DM control

## 2012-09-20 ENCOUNTER — Other Ambulatory Visit: Payer: Self-pay

## 2012-10-04 ENCOUNTER — Encounter: Payer: Self-pay | Admitting: Family Medicine

## 2012-10-04 ENCOUNTER — Ambulatory Visit (INDEPENDENT_AMBULATORY_CARE_PROVIDER_SITE_OTHER): Payer: No Typology Code available for payment source | Admitting: Family Medicine

## 2012-10-04 VITALS — BP 105/71 | HR 67 | Temp 98.3°F | Ht 59.0 in | Wt 214.0 lb

## 2012-10-04 DIAGNOSIS — R300 Dysuria: Secondary | ICD-10-CM

## 2012-10-04 DIAGNOSIS — B379 Candidiasis, unspecified: Secondary | ICD-10-CM | POA: Insufficient documentation

## 2012-10-04 LAB — POCT URINALYSIS DIPSTICK
Bilirubin, UA: NEGATIVE
Ketones, UA: NEGATIVE
Leukocytes, UA: NEGATIVE
Protein, UA: 30
Spec Grav, UA: >=1.03

## 2012-10-04 MED ORDER — AMITRIPTYLINE HCL 10 MG PO TABS: 10.0000 mg | ORAL_TABLET | Freq: Every day | ORAL | Status: DC

## 2012-10-04 MED ORDER — FLUCONAZOLE 150 MG PO TABS: 150.0000 mg | ORAL_TABLET | Freq: Once | ORAL | Status: DC

## 2012-10-04 NOTE — Patient Instructions (Addendum)
Nice to meet you. I have prescribed a new medication for your bladder pain called amitriptyline. You will take 10 mg of this per night. I want you to follow up with Dr. Mikel Cella in 1-2 weeks to see if this is helping. I have also given you a prescription for fluconazole.

## 2012-10-04 NOTE — Progress Notes (Signed)
  Subjective:    Patient ID: [name], female    DOB: 04-Jun-1975, 37 y.o.   MRN: [id number]  Urinary Tract Infection    Patient presents with long history of pain with peeing. Has had multiple UAs that have all shown no signs of infection. Has tried ditropan without benefit. Was on prozac though states this caused a yeast infection. Now with some itchiness when goes to the bathroom.   Review of Systems see HPI     Objective:   Physical Exam  Constitutional: She appears well-developed and well-nourished.  Abdominal: Soft. She exhibits no distension. There is tenderness (suprapubic). There is no rebound and no guarding.  Genitourinary: Vagina normal. No vaginal discharge found.  No erythema  BP 105/71  Pulse 67  Temp(Src) 98.3 F (36.8 C) (Oral)  Ht 4\' 11"  (1.499 m)  Wt 214 lb (97.07 kg)  BMI 43.2 kg/m2      Assessment & Plan:

## 2012-10-07 NOTE — Assessment & Plan Note (Signed)
Patient with recent exposure to antibiotics. Complains of yeast infection. Treat with fluconazole.

## 2012-10-07 NOTE — Assessment & Plan Note (Addendum)
Patient with continued dysuria despite treatment with prozac. Will change to amitriptyline and have f/u in 1-2 weeks with PCP to see if this is helping.

## 2012-10-17 ENCOUNTER — Ambulatory Visit (INDEPENDENT_AMBULATORY_CARE_PROVIDER_SITE_OTHER): Payer: No Typology Code available for payment source | Admitting: Family Medicine

## 2012-10-17 ENCOUNTER — Encounter: Payer: Self-pay | Admitting: Family Medicine

## 2012-10-17 VITALS — BP 110/75 | HR 69 | Temp 98.3°F | Wt 213.0 lb

## 2012-10-17 DIAGNOSIS — R1084 Generalized abdominal pain: Secondary | ICD-10-CM

## 2012-10-17 DIAGNOSIS — M542 Cervicalgia: Secondary | ICD-10-CM | POA: Insufficient documentation

## 2012-10-17 DIAGNOSIS — N949 Unspecified condition associated with female genital organs and menstrual cycle: Secondary | ICD-10-CM

## 2012-10-17 DIAGNOSIS — R102 Pelvic and perineal pain: Secondary | ICD-10-CM

## 2012-10-17 NOTE — Assessment & Plan Note (Signed)
Cont to monitor. Decrease fat in diet. Stop all extra medications and we will recheck in 1 month.

## 2012-10-17 NOTE — Assessment & Plan Note (Signed)
Most likely muscle tightness. Asked to keep a journal of when her neck hurts and how she is feeling. I suspect stress is making this worse. No medications prescribed, but can use heat/ice prn.

## 2012-10-17 NOTE — Progress Notes (Signed)
Patient ID: Febe Champa, female   DOB: June 29, 1975, 37 y.o.   MRN: [id number]  [facility] Amber M. Hairford, MD Phone: [phone]   Subjective: HPI: Patient is a 37 y.o. female presenting to clinic today for follow up appointment. Phone interpreter used until [place] arrived.  Concerns today include neck pain  1. Pelvic pain- Improved with Amitriptyline. Taking every night. Wants to start over with all of her medications.   2. Neck pain- Pain in back of neck and cannot move it. Started a while ago. Comes and goes. When it happens it happens multiple times per day. She does notice it associated with stress. No injury. Goes away on its own. Does not use muscle rubs or heating pad. It comes on suddenly, and goes away suddenly.  Sometimes associated with headache or dizziness. Sometimes when bending over has shortness of breath.   3. Abd pain- Having sensitive stomach. Taking Metformin, Glyburide and Amitriptyline. Reports diarrhea and abd pain. Recently treated with abx for H.pylori  History Reviewed: Non-smoker.  ROS: Please see HPI above.  Objective: Office vital signs reviewed. BP 110/75  Pulse 69  Temp(Src) 98.3 F (36.8 C) (Oral)  Wt 213 lb (96.616 kg)  BMI 43 kg/m2  Physical Examination:  General: Awake, alert. NAD. HEENT: Atraumatic, normocephalic. Neck: No masses palpated. No LAD. No posterior cervical tenderness. No muscle tightness. Pulm: CTAB, no wheezes Cardio: RRR, no murmurs appreciated Abdomen:+BS, soft, mild TTP suprapubic, nondistended Extremities: No edema Neuro: Grossly intact  Assessment: 37 y.o. female follow up  Plan: See Problem List and After Visit Summary

## 2012-10-17 NOTE — Assessment & Plan Note (Signed)
Improving. Cont amitriptyline 10mg  nightly. F/u in 1 month and increase dose if needed.

## 2012-10-17 NOTE — Patient Instructions (Signed)
Keep taking only 3 medications:  Amitriptyline for pain. Metformin and Glyburide for your diabetes.  I will see you back in a few weeks for a follow up.  Kellye Mizner M. Stefani Baik, M.D.

## 2012-10-17 NOTE — Progress Notes (Signed)
Interpreter Pheonix Clinkscale Namihira for Dr Hairford 

## 2012-11-01 ENCOUNTER — Ambulatory Visit: Payer: No Typology Code available for payment source | Admitting: Family Medicine

## 2012-11-07 ENCOUNTER — Ambulatory Visit (INDEPENDENT_AMBULATORY_CARE_PROVIDER_SITE_OTHER): Payer: No Typology Code available for payment source | Admitting: Family Medicine

## 2012-11-07 ENCOUNTER — Encounter: Payer: Self-pay | Admitting: Family Medicine

## 2012-11-07 VITALS — BP 113/80 | HR 80 | Ht 59.0 in | Wt 211.0 lb

## 2012-11-07 DIAGNOSIS — I10 Essential (primary) hypertension: Secondary | ICD-10-CM

## 2012-11-07 DIAGNOSIS — M542 Cervicalgia: Secondary | ICD-10-CM

## 2012-11-07 DIAGNOSIS — E119 Type 2 diabetes mellitus without complications: Secondary | ICD-10-CM

## 2012-11-07 LAB — POCT GLYCOSYLATED HEMOGLOBIN (HGB A1C): Hemoglobin A1C: 9.8

## 2012-11-07 NOTE — Progress Notes (Signed)
Patient ID: [name], female   DOB: 1975/09/11, 37 y.o.   MRN: [id number]  [facility] Gennaro Lizotte M. Hasaan Radde, MD Phone: [phone]   Subjective: HPI: Patient is a 37 y.o. female presenting to clinic today for follow up appointment.  1. Diabetes: More stressed than usual and eating more. Weight is actually down and she is walking more than usual. High at home: 140 (fasting) Low at home: 100 Taking medications: Metformin and glyburide.  Side effects: None. Not missed any doses. ROS: denies fever, chills, dizziness, LOC, polyuria, polydipsia, numbness or tingling in extremities or chest pain. Last eye exam: 2 years ago - [facility] sent reminder. Will refer today.  Last foot exam: Needs today Nephropathy screen indicated?: Last one in March 2014 and creat was normal.  2. Hypertension Blood pressure today: 113/80 Taking Meds: No medication  ROS: Denies headache, visual changes, nausea, vomiting, chest pain, abdominal pain or shortness of breath.  3. Neck pain  Patient recorded her neck pain. Had total of 8 episodes of neck pain. A few times it also caused headache and arm pain. Taking Elavil qhs.   History Reviewed: Never smoker. Health Maintenance: Will need flu shot  ROS: Please see HPI above.  Objective: Office vital signs reviewed. BP 113/80  Pulse 80  Ht 4\' 11"  (1.499 m)  Wt 211 lb (95.709 kg)  BMI 42.59 kg/m2  Physical Examination:  General: Awake, alert. NAD HEENT: Atraumatic, normocephalic Neck: No masses palpated. No LAD. TTP of paracervical muscles Pulm: CTAB, no wheezes Cardio: RRR, no murmurs appreciated Abdomen:+BS, soft, diffuse tenderness, nondistended Extremities: No edema Neuro: Grossly intact  Assessment: 37 y.o. female follow up.  Plan: See Problem List and After Visit Summary

## 2012-11-07 NOTE — Patient Instructions (Addendum)
We will schedule you an eye appointment. [name] is going to work with you on your habits at home to see if we can get control of your blood sugars. I will see you back in 6 weeks for a follow up.   Good job on walking and weight loss!  Amber M. Hairford, M.D.

## 2012-11-07 NOTE — Assessment & Plan Note (Signed)
Stable. No medications at this time. 

## 2012-11-07 NOTE — Assessment & Plan Note (Signed)
A1C elevated. Patient's medications worked previously. Met with [name] today about behavior modification and will continue to follow up via telephone with her. RTC in 6 weeks to reassess. Continue Glyburide and Metformin for now

## 2012-11-07 NOTE — Assessment & Plan Note (Signed)
Likely a result of stress. [facility] psych intern saw patient and discussed options with her. She is open to medication today, and will contact [facility] clinic to discuss counseling. RTC in 6 weeks and likely start antidepressant at that time.

## 2012-11-08 ENCOUNTER — Ambulatory Visit: Payer: No Typology Code available for payment source | Admitting: Family Medicine

## 2012-12-05 ENCOUNTER — Encounter: Payer: Self-pay | Admitting: Family Medicine

## 2012-12-05 ENCOUNTER — Ambulatory Visit (INDEPENDENT_AMBULATORY_CARE_PROVIDER_SITE_OTHER): Payer: No Typology Code available for payment source | Admitting: Family Medicine

## 2012-12-05 VITALS — BP 110/79 | HR 79 | Temp 98.2°F | Wt 214.0 lb

## 2012-12-05 DIAGNOSIS — R21 Rash and other nonspecific skin eruption: Secondary | ICD-10-CM

## 2012-12-05 DIAGNOSIS — Z23 Encounter for immunization: Secondary | ICD-10-CM

## 2012-12-05 DIAGNOSIS — I10 Essential (primary) hypertension: Secondary | ICD-10-CM

## 2012-12-05 DIAGNOSIS — B379 Candidiasis, unspecified: Secondary | ICD-10-CM

## 2012-12-05 DIAGNOSIS — K7689 Other specified diseases of liver: Secondary | ICD-10-CM

## 2012-12-05 DIAGNOSIS — K760 Fatty (change of) liver, not elsewhere classified: Secondary | ICD-10-CM

## 2012-12-05 DIAGNOSIS — R300 Dysuria: Secondary | ICD-10-CM

## 2012-12-05 LAB — COMPREHENSIVE METABOLIC PANEL
ALT: 17 U/L (ref 0–35)
AST: 21 U/L (ref 0–37)
Albumin: 3.9 g/dL (ref 3.5–5.2)
CO2: 24 mEq/L (ref 19–32)
Calcium: 9 mg/dL (ref 8.4–10.5)
Chloride: 104 mEq/L (ref 96–112)
Potassium: 4.1 mEq/L (ref 3.5–5.3)
Sodium: 135 mEq/L (ref 135–145)
Total Protein: 7.2 g/dL (ref 6.0–8.3)

## 2012-12-05 LAB — CBC
HCT: 32.7 % — ABNORMAL LOW (ref 36.0–46.0)
MCHC: 30.6 g/dL (ref 30.0–36.0)
Platelets: 322 10*3/uL (ref 150–400)
RDW: 18.1 % — ABNORMAL HIGH (ref 11.5–15.5)
WBC: 6.9 10*3/uL (ref 4.0–10.5)

## 2012-12-05 LAB — LIPID PANEL
Cholesterol: 148 mg/dL (ref 0–200)
HDL: 41 mg/dL (ref >39)
LDL Cholesterol: 85 mg/dL (ref 0–99)
Triglycerides: 112 mg/dL (ref <150)

## 2012-12-05 MED ORDER — AMITRIPTYLINE HCL 10 MG PO TABS: 10.0000 mg | ORAL_TABLET | Freq: Every day | ORAL | Status: DC

## 2012-12-05 MED ORDER — TRIAMCINOLONE ACETONIDE 0.1 % EX CREA: TOPICAL_CREAM | Freq: Two times a day (BID) | CUTANEOUS | Status: DC

## 2012-12-05 MED ORDER — FLUCONAZOLE 100 MG PO TABS: 100.0000 mg | ORAL_TABLET | Freq: Every day | ORAL | Status: DC

## 2012-12-05 NOTE — Assessment & Plan Note (Signed)
Decline pelvic exam today. Based on history of itch/mild discharge and known uncontrolled DM, will treat with Fluconazole. F/u in 2 weeks if not improved for pelvic exam.

## 2012-12-05 NOTE — Assessment & Plan Note (Signed)
Due for labs today. 

## 2012-12-05 NOTE — Patient Instructions (Addendum)
Take Benadryl for itching. Use the Triamcinolone cream on the rash. It should go away within a few days.  Take one pill of Diflucan for the yeast infection. You can take the 2nd pill if you need to in 3 days. If it does not get better, please come back.  Picadura de insectos  Surveyor, minerals)  Los mosquitos, las moscas, las [place], las chinches y muchos otros insectos pueden morder. Las picaduras de insectos son diferentes si tienen aguijn. El aguijn inyecta un veneno en la piel. Algunos insectos pueden transmitir enfermedades infecciosas con las picaduras.  SNTOMAS  La picadura generalmente se pone roja, se hincha y pica durante 2 a 4 das. Generalmente desaparecen por s mismas.  TRATAMIENTO  El mdico indicar antibiticos si aparece una infeccin bacteriana en la zona de la picadura.  INSTRUCCIONES PARA EL CUIDADO EN EL HOGAR   No rasque la zona.  Mantenga la zona limpia y seca. Lave la herida suavemente con [place].  Aplquese hielo o compresas fras.  Ponga el hielo en una bolsa plstica.  Colquese una toalla entre la piel y la bolsa de hielo.  Deje la bolsa de hielo durante 20 minutos 4 veces por da, durante los primeros 2  3 das, o segn las indicaciones.  Puede aplicar una pasta con bicarbonato de sodio, una crema con corticoides, una locin de calamina, segn las indicaciones del mdico. Esto ayuda a reducir la picazn y la hinchazn.  Tome slo medicamentos de venta libre o recetados, segn las indicaciones del mdico.  Si le han recetado antibiticos, tmelos segn las indicaciones. Tmelos todos, aunque se sienta mejor. Deber aplicarse la vacuna contra el ttanos si:  No recuerda cundo se coloc la vacuna la ltima vez.  Nunca recibi esta vacuna.  La lesin ha [employer]. Si le han aplicado la vacuna contra el ttanos, el brazo podr hincharse, enrojecer y sentirse caliente al tacto. Esto es frecuente y no es un problema. Si usted necesita aplicarse  la vacuna y se niega a recibirla, corre riesgo de contraer ttanos. sta es una enfermedad grave.  SOLICITE ATENCIN MDICA DE INMEDIATO SI:   Siente un dolor cada vez ms intenso y observa enrojecimiento e hinchazn en la herida.  Hay una lnea roja en la piel, cercana a la zona de la picadura.  Tiene fiebre.  Siente dolor en la articulacin.  Siente dolor de cabeza intenso o dolor en el cuello.  Siente debilidad muscular no habitual.  Tiene una erupcin.  Siente falta de aire o Journalist, newspaper.  Tiene dolor abdominal, nuseas o vmitos.  Se siente muy cansado o confundido. EST SEGURO QUE:   Comprende las instrucciones para el alta mdica.  Controlar su enfermedad.  Solicitar atencin mdica de inmediato segn las indicaciones. Document Released: 02/28/2005 Document Revised: 05/23/2011 [facility] Patient Information 2014 [place], [place].

## 2012-12-05 NOTE — Assessment & Plan Note (Signed)
Rash appears to be insect bites. She is itching excessively and aggravating the scratch/itch cycle. Given triamcinolone topically for control of local irritation. Take Benadryl prn itching. Cool baths or ice packs to areas that are really bothering her. F/u if fails to improve

## 2012-12-05 NOTE — Progress Notes (Signed)
Patient ID: [name], female   DOB: January 03, 1976, 37 y.o.   MRN: [id number]  [facility] Kaitlyn Skowron M. Alik Mawson, MD Phone: [phone]   Subjective: HPI: Patient is a 37 y.o. female presenting to clinic today for concern of rash. Daughter is present as interpreter, and has signed sheet to decline provided interpreter.  Rash Patient presents for evaluation of a rash involving the entire body. Rash started 5 days ago. Lesions are pink, and raised in texture. Rash has changed over time. Rash is painful and is pruritic. Associated symptoms: none. Patient denies: fever, headache and nausea. Patient has not had contacts with similar rash. Patient has not had new exposures (soaps, lotions, laundry detergents, foods, medications, plants, insects or animals).  Vaginitis Patient presents for evaluation of an abnormal vaginal itching. Symptoms have been present for 2 weeks. Vaginal symptoms: burning and local irritation. Contraception: none. She denies abnormal bleeding and lesions. She has history of diabetes.  History Reviewed: Non-smoker. Health Maintenance: Needs flu shot  ROS: Please see HPI above.  Objective: Office vital signs reviewed. BP 110/79  Pulse 79  Temp(Src) 98.2 F (36.8 C) (Oral)  Wt 214 lb (97.07 kg)  BMI 43.2 kg/m2  Physical Examination:  General: Awake, alert. NAD. Constantly itching arms HEENT: Atraumatic, normocephalic. MMM. Pulm: CTAB, no wheezes Cardio: RRR, no murmurs appreciated Abdomen:+BS, soft, nontender, nondistended Extremities: No edema Neuro: Grossly intact Skin: Few areas of mildly erythematous papular rash on upper extremities. More excoriated rash on lower extremities, appears to be insect bites   Assessment: 37 y.o. female with rash  Plan: See Problem List and After Visit Summary

## 2012-12-10 ENCOUNTER — Ambulatory Visit: Payer: Self-pay

## 2012-12-17 ENCOUNTER — Ambulatory Visit: Payer: Self-pay

## 2012-12-20 ENCOUNTER — Ambulatory Visit: Payer: Self-pay | Admitting: Family Medicine

## 2012-12-20 ENCOUNTER — Ambulatory Visit: Payer: Self-pay

## 2012-12-21 ENCOUNTER — Encounter: Payer: Self-pay | Admitting: Family Medicine

## 2012-12-21 ENCOUNTER — Ambulatory Visit (INDEPENDENT_AMBULATORY_CARE_PROVIDER_SITE_OTHER): Payer: No Typology Code available for payment source | Admitting: Family Medicine

## 2012-12-21 VITALS — BP 120/66 | HR 78 | Temp 97.6°F | Ht 59.0 in | Wt 215.8 lb

## 2012-12-21 DIAGNOSIS — D649 Anemia, unspecified: Secondary | ICD-10-CM

## 2012-12-21 MED ORDER — FERROUS SULFATE 325 (65 FE) MG PO TABS: 325.0000 mg | ORAL_TABLET | Freq: Three times a day (TID) | ORAL | Status: DC

## 2012-12-21 NOTE — Patient Instructions (Addendum)
Tu hierro Dietitian. Empieza a tomar [employer] a ver si tus sintomas mejoran.  Haz una cita de seguimiento con tu doctora.

## 2012-12-21 NOTE — Progress Notes (Signed)
Family Medicine Office Visit Note   Subjective:   Patient ID: [name], female  DOB: [birth year], 37 y.o.. MRN: [id number]   Pt that comes today for same day appointment complaining about generalized fatigue and weakness. She has been having symptoms for two years but exacerbated in the past 2 days. She reports eating ice and feeling very tired. She has difficulty falling sleep and wake up very early in the morning.  She also reports heavy periods and intermittent low abdominal/pelvic pain that has been studied with no definitive answers. She also reports non bloody, non mucous soft stools. Pt denies SOB, chest pain, palpitations. No changes on urinary or BM habits. No unintentional weigh loss/gain.   LMP was 4 weeks ago, no recent blood loss. Birth control: tubal ligation  Review of Systems:  Per HPI  Objective:   Physical Exam: Gen:  Obese, NAD HEENT: Moist and pale mucous membranes  CV: Regular rate and rhythm, no murmurs rubs or gallops PULM: Clear to auscultation bilaterally.  ABD: Soft, non tender, non distended, normal bowel sounds EXT: No edema Neuro: Alert and oriented x3. No neurologic focalization.  Assessment & Plan:

## 2012-12-21 NOTE — Assessment & Plan Note (Addendum)
Unspecific generalized symptoms and pica with anemia per recent labs with low MCV.  Her only hx of blood loss is her menses that are reported to be irregular but with very heavy flow. No Hx of fibroma or other conditions that can explain heavy vag bleeding. No P or FHx of coagulopathies.    Discussed starting her on iron tablets 3 times a day for now and f/u with PCP. Consider further eval if GI symptoms continue.

## 2013-01-07 ENCOUNTER — Ambulatory Visit (INDEPENDENT_AMBULATORY_CARE_PROVIDER_SITE_OTHER): Payer: No Typology Code available for payment source | Admitting: Family Medicine

## 2013-01-07 VITALS — BP 122/71 | HR 86 | Temp 98.9°F | Wt 217.0 lb

## 2013-01-07 DIAGNOSIS — M79644 Pain in right finger(s): Secondary | ICD-10-CM

## 2013-01-07 DIAGNOSIS — M79609 Pain in unspecified limb: Secondary | ICD-10-CM

## 2013-01-07 DIAGNOSIS — D649 Anemia, unspecified: Secondary | ICD-10-CM

## 2013-01-07 MED ORDER — SULFAMETHOXAZOLE-TRIMETHOPRIM 800-160 MG PO TABS: 1.0000 | ORAL_TABLET | Freq: Two times a day (BID) | ORAL | Status: DC

## 2013-01-07 NOTE — Patient Instructions (Signed)
°  Infección en la punta de los dedos °(Fingertip Infection) °Usted padece una infección en el dedo. Cuando la infección está localizada alrededor de la uña, se denomina paroniquia. Cuando se produce en la punta del dedo se llama felón. Estas infecciones se deben a lesiones o rupturas menores en la piel. Si no se tratan adecuadamente, pueden conducir a una infección en el hueso y a un daño permanente en la uña. °Es necesario practicar una incisión y un drenaje si se ha formado pus (un absceso). También puede ser necesario administrar antibióticos y medicamentos para aliviar el dolor. Mantenga la mano elevada durante 2 ó 3 días, para disminuir la hinchazón y el dolor. Si le han colocado una compresa en el absceso, deberá retirarla en 1 ó 2 días. Remoje el dedo en agua tibia durante 20 minutos, 4 veces por día para favorecer el drenaje. °Mantenga las manos tan secas como sea posible. Utilice guantes protectores con interior de algodón. Puede ser de utilidad administrar medicamentos antimicóticos durante una o dos semanas. Comuníquese con los profesionales que lo asisten para realizar un seguimiento según las indicaciones. °INSTRUCCIONES PARA EL CUIDADO DOMICILIARIO °· Entre los períodos de enjuagues con agua tibia, mantenga la herida limpia, seca y vendada como se lo indicó el profesional que lo asiste. °· Remoje el dedo en agua con sal durante quince minutos, cuatro veces por día en caso de infección bacteriana. °· Para las infecciones bacterianas (por gérmenes) deberá tomar antibióticos (medicamentos que destruyen los gérmenes) según las indicaciones, y finalizar todos los que le han prescripto, aún si el problema parece haber mejorado antes de terminar el medicamento. °· Utilice los medicamentos de venta libre o de prescripción para el dolor, el malestar o la fiebre, según se lo indique el profesional que lo asiste. °SOLICITE ATENCIÓN MÉDICA DE INMEDIATO SI: °· Presenta enrojecimiento, hinchazón o aumento del dolor  en la herida. °· Aparece pus en la herida. °· Presenta una temperatura oral superior a 38,9° C (102° F). °· Advierte un olor fétido que proviene de la herida o del vendaje. °ESTÉ SEGURO QUE:  °· Comprende las instrucciones para el alta médica. °· Controlará su enfermedad. °· Solicitará atención médica de inmediato según las indicaciones. °Document Released: 02/28/2005 Document Revised: 05/23/2011 °[employer]® Patient Information ©2014 [employer]. °. ° °

## 2013-01-07 NOTE — Progress Notes (Signed)
Patient ID: [name], female   DOB: 1975-08-09, 37 y.o.   MRN: [id number]  [facility] Brayleigh Rybacki M. Sharnese Heath, MD Phone: [phone]   Subjective: HPI: Patient is a 37 y.o. female presenting to clinic today for finger pain.  Hit finger 3 months ago with door. Nail fell off then, new nail grew back. Finger is painful. Using topical antibiotics. Never injured finger before. Does report some drainage from around the nail. Extremely TTP and pain goes up hand/wrist/arm.  Also, started on iron a few weeks ago for anemia. Overall doing well with no concerns.  History Reviewed: Non smoker. Health Maintenance: UTD  ROS: Please see HPI above.  Objective: Office vital signs reviewed. BP 122/71  Pulse 86  Temp(Src) 98.9 F (37.2 C) (Oral)  Wt 217 lb (98.431 kg)  BMI 43.81 kg/m2  LMP 12/24/2012  Physical Examination:  General: Awake, alert. NAD Pulm: CTAB, no wheezes Cardio: RRR, no murmurs appreciated Extremities: Right middle finger with swelling. Lateral aspect of nail is raised from bed with ?callous formation. TTP of entire finger. Unable to appreciate purulent drainage. TTP of metacarpal as well. No gross deformity. Decreased grip and ROM of middle finger, likely secondary to pain. Neuro: Grossly intact  Assessment: 37 y.o. female with finger pain  Plan: See Problem List and After Visit Summary

## 2013-01-07 NOTE — Assessment & Plan Note (Signed)
Doing well on iron. Con't treatment. Recheck labs in 2-3 months.

## 2013-01-07 NOTE — Assessment & Plan Note (Signed)
Pain and swelling in finger after remote injury. Report of some drainage around nail, so possible paronychia? Will X-ray finger to eval for fracture. Bactrim x7 days for infection. If not improved, RTC for evaluation. May need to buddy tape or splint finger for comfort and to immobilize for better healing. Will see how she does on antibiotics alone first.

## 2013-01-08 ENCOUNTER — Ambulatory Visit (HOSPITAL_COMMUNITY)
Admission: RE | Admit: 2013-01-08 | Discharge: 2013-01-08 | Disposition: A | Discharge status: Home / Self Care | Payer: No Typology Code available for payment source | Source: Ambulatory Visit | Attending: Family Medicine | Admitting: Family Medicine

## 2013-01-08 DIAGNOSIS — X58XXXA Exposure to other specified factors, initial encounter: Secondary | ICD-10-CM | POA: Insufficient documentation

## 2013-01-08 DIAGNOSIS — M79609 Pain in unspecified limb: Secondary | ICD-10-CM | POA: Insufficient documentation

## 2013-01-08 DIAGNOSIS — M79644 Pain in right finger(s): Secondary | ICD-10-CM

## 2013-01-08 DIAGNOSIS — M7989 Other specified soft tissue disorders: Secondary | ICD-10-CM | POA: Insufficient documentation

## 2013-01-14 ENCOUNTER — Encounter (HOSPITAL_COMMUNITY): Payer: Self-pay | Admitting: Emergency Medicine

## 2013-01-14 ENCOUNTER — Ambulatory Visit: Payer: No Typology Code available for payment source | Admitting: Family Medicine

## 2013-01-14 ENCOUNTER — Emergency Department (HOSPITAL_COMMUNITY): Payer: No Typology Code available for payment source

## 2013-01-14 ENCOUNTER — Emergency Department (HOSPITAL_COMMUNITY)
Admission: EM | Admit: 2013-01-14 | Discharge: 2013-01-14 | Disposition: A | Discharge status: Home / Self Care | Payer: No Typology Code available for payment source | Attending: Emergency Medicine | Admitting: Emergency Medicine

## 2013-01-14 DIAGNOSIS — E119 Type 2 diabetes mellitus without complications: Secondary | ICD-10-CM | POA: Insufficient documentation

## 2013-01-14 DIAGNOSIS — F329 Major depressive disorder, single episode, unspecified: Secondary | ICD-10-CM | POA: Insufficient documentation

## 2013-01-14 DIAGNOSIS — Z79899 Other long term (current) drug therapy: Secondary | ICD-10-CM | POA: Insufficient documentation

## 2013-01-14 DIAGNOSIS — Z792 Long term (current) use of antibiotics: Secondary | ICD-10-CM | POA: Insufficient documentation

## 2013-01-14 DIAGNOSIS — F3289 Other specified depressive episodes: Secondary | ICD-10-CM | POA: Insufficient documentation

## 2013-01-14 DIAGNOSIS — K219 Gastro-esophageal reflux disease without esophagitis: Secondary | ICD-10-CM | POA: Insufficient documentation

## 2013-01-14 DIAGNOSIS — M5412 Radiculopathy, cervical region: Secondary | ICD-10-CM

## 2013-01-14 DIAGNOSIS — M62838 Other muscle spasm: Secondary | ICD-10-CM

## 2013-01-14 DIAGNOSIS — I10 Essential (primary) hypertension: Secondary | ICD-10-CM | POA: Insufficient documentation

## 2013-01-14 MED ORDER — TRAMADOL HCL 50 MG PO TABS: 50.0000 mg | ORAL_TABLET | Freq: Four times a day (QID) | ORAL | Status: DC | PRN

## 2013-01-14 MED ORDER — METHOCARBAMOL 500 MG PO TABS: 500.0000 mg | ORAL_TABLET | Freq: Two times a day (BID) | ORAL | Status: DC | PRN

## 2013-01-14 NOTE — ED Notes (Signed)
Pt c/o R shoulder pain since Thursday and R hand pain x months after slamming her R arm in car door. Cms intact

## 2013-01-14 NOTE — ED Provider Notes (Signed)
CSN: [id number]     Arrival date & time 01/14/13  1523 History  This chart was scribed for non-physician practitioner, Sharilyn Sites, PA-C working with Richardean Canal, MD by Greggory Stallion, ED scribe. This patient was seen in room TR10C/TR10C and the patient's care was started at 4:56 PM.   Chief Complaint  Patient presents with  . Arm Pain   The history is provided by the patient. No language interpreter was used.   HPI Comments: Nehemiah Montee is a 37 y.o. female who presents to the Emergency Department complaining of gradual onset, worsening right shoulder pain x 3 days.  Patient states pain begins along right posterior neck, and radiates down entire length of her right arm. Denies any numbness or weakness of right extremity.  Patient states she did slam her right arm in a car door several months ago, but these symptoms feel different. No recent injury, trauma, or falls.  Has been taking tylenol and applying ice without relief.  Pt has hx of same-- occuring intermittent over the past few years.  No prior neck injury.  Has never seen neurosurgery.  Past Medical History  Diagnosis Date  . Hypertension   . Depression   . Diabetes mellitus   . GERD (gastroesophageal reflux disease)    Past Surgical History  Procedure Laterality Date  . Cesarean section    . Tubal ligation     Family History  Problem Relation Age of Onset  . Hypertension Mother   . Diabetes Father    History  Substance Use Topics  . Smoking status: Never Smoker   . Smokeless tobacco: Never Used  . Alcohol Use: No   OB History   Grav Para Term Preterm Abortions TAB SAB Ect Mult Living   3 3 3       3      Review of Systems  Musculoskeletal: Positive for arthralgias.  All other systems reviewed and are negative.    Allergies  Review of patient's allergies indicates no known allergies.  Home Medications   Current Outpatient Rx  Name  Route  Sig  Dispense  Refill  . amitriptyline (ELAVIL) 10 MG  tablet   Oral   Take 1 tablet (10 mg total) by mouth at bedtime.   30 tablet   2   . ferrous sulfate (FERROUSUL) 325 (65 FE) MG tablet   Oral   Take 1 tablet (325 mg total) by mouth 3 (three) times daily with meals.   90 tablet   3   . glyBURIDE (DIABETA) 5 MG tablet   Oral   Take 2 tablets (10 mg total) by mouth 2 (two) times daily with a meal.   180 tablet   3   . metFORMIN (GLUCOPHAGE) 1000 MG tablet   Oral   Take 1 tablet (1,000 mg total) by mouth 2 (two) times daily with a meal.   180 tablet   3   . omeprazole (PRILOSEC) 20 MG capsule   Oral   Take 1 capsule (20 mg total) by mouth 2 (two) times daily.   28 capsule   3   . sulfamethoxazole-trimethoprim (BACTRIM DS) 800-160 MG per tablet   Oral   Take 1 tablet by mouth 2 (two) times daily.   14 tablet   0    BP 125/91  Pulse 86  Temp(Src) 97.8 F (36.6 C) (Oral)  Resp 20  Wt 215 lb 2 oz (97.58 kg)  SpO2 97%  LMP 01/12/2013  Physical Exam  Nursing note and vitals reviewed. Constitutional: She is oriented to person, place, and time. She appears well-developed and well-nourished.  HENT:  Head: Normocephalic and atraumatic.  Mouth/Throat: Oropharynx is clear and moist.  Eyes: Conjunctivae and EOM are normal. Pupils are equal, round, and reactive to light.  Neck: Normal range of motion.  Cardiovascular: Normal rate, regular rhythm and normal heart sounds.   Pulmonary/Chest: Effort normal and breath sounds normal.  Abdominal: Soft. Bowel sounds are normal.  Musculoskeletal: Normal range of motion.       Cervical back: She exhibits tenderness, pain and spasm.  Tenderness to palpation of right cervical paraspinal muscles with spasm present. Full ROM of right shoulder, elbow and hand without visible signs of trauma.  Strong radial pulse and cap refill; sensation intact  Neurological: She is alert and oriented to person, place, and time.  Skin: Skin is warm and dry.  Psychiatric: She has a normal mood and  affect.    ED Course  Procedures (including critical care time)  DIAGNOSTIC STUDIES: Oxygen Saturation is 97% on RA, normal by my interpretation.    COORDINATION OF CARE: 4:58 PM-Discussed treatment plan which includes pain medication with pt at bedside and pt agreed to plan. Advised pt to follow up with PCP if pain does not resolve.   Labs Review Labs Reviewed - No data to display Imaging Review Dg Shoulder Right  01/14/2013   CLINICAL DATA:  Right shoulder pain.  EXAM: RIGHT SHOULDER - 2+ VIEW  COMPARISON:  None.  FINDINGS: No fracture or dislocation. The [facility] joint is normal. The visualized right lung is clear.  IMPRESSION: No acute bony findings.   Electronically Signed   By: Loralie Champagne M.D.   On: 01/14/2013 16:38   Dg Hand Complete Right  01/14/2013   CLINICAL DATA:  Right hand pain.  EXAM: RIGHT HAND - COMPLETE 3+ VIEW  COMPARISON:  01/08/2013.  FINDINGS: The joint spaces are maintained.  No acute fracture.  IMPRESSION: No acute bony findings.   Electronically Signed   By: Loralie Champagne M.D.   On: 01/14/2013 16:39    EKG Interpretation   None       MDM   1. Cervical radiculopathy   2. Muscle spasms of neck     X-rays negative for acute fx or dislocation.  Signs/sx consistent with cervical radiculopathy.  Rx tramadol and robaxin.  FU with PCP if no improvement in the next few days. Discussed plan with patient, she agreed. Return precautions advised.  I personally performed the services described in this documentation, which was scribed in my presence. The recorded information has been reviewed and is accurate.  Garlon Hatchet, PA-C 01/14/13 2323

## 2013-01-14 NOTE — ED Notes (Signed)
Pt in xray

## 2013-01-17 NOTE — ED Provider Notes (Signed)
Medical screening examination/treatment/procedure(s) were performed by non-physician practitioner and as supervising physician I was immediately available for consultation/collaboration.  EKG Interpretation   None         David H Yao, MD 01/17/13 0857 

## 2013-03-05 LAB — HM DIABETES EYE EXAM

## 2013-05-13 ENCOUNTER — Ambulatory Visit: Payer: No Typology Code available for payment source

## 2013-07-31 ENCOUNTER — Encounter: Payer: Self-pay | Admitting: Family Medicine

## 2013-07-31 ENCOUNTER — Ambulatory Visit (INDEPENDENT_AMBULATORY_CARE_PROVIDER_SITE_OTHER): Payer: No Typology Code available for payment source | Admitting: Family Medicine

## 2013-07-31 VITALS — BP 128/82 | HR 80 | Temp 98.4°F | Ht 59.0 in | Wt 204.0 lb

## 2013-07-31 DIAGNOSIS — E119 Type 2 diabetes mellitus without complications: Secondary | ICD-10-CM

## 2013-07-31 DIAGNOSIS — D649 Anemia, unspecified: Secondary | ICD-10-CM

## 2013-07-31 DIAGNOSIS — W57XXXA Bitten or stung by nonvenomous insect and other nonvenomous arthropods, initial encounter: Secondary | ICD-10-CM

## 2013-07-31 DIAGNOSIS — T148 Other injury of unspecified body region: Secondary | ICD-10-CM

## 2013-07-31 MED ORDER — GLYBURIDE 5 MG PO TABS: 10.0000 mg | ORAL_TABLET | Freq: Two times a day (BID) | ORAL | Status: DC

## 2013-07-31 MED ORDER — METFORMIN HCL 1000 MG PO TABS: 1000.0000 mg | ORAL_TABLET | Freq: Two times a day (BID) | ORAL | Status: DC

## 2013-07-31 MED ORDER — FERROUS SULFATE 325 (65 FE) MG PO TABS: 325.0000 mg | ORAL_TABLET | Freq: Three times a day (TID) | ORAL | Status: DC

## 2013-07-31 MED ORDER — DOXYCYCLINE HYCLATE 100 MG PO TABS: 100.0000 mg | ORAL_TABLET | Freq: Two times a day (BID) | ORAL | Status: DC

## 2013-07-31 NOTE — Patient Instructions (Signed)
Tick Bite Information Ticks are insects that attach themselves to the skin and draw blood for food. There are various types of ticks. Common types include wood ticks and deer ticks. Most ticks live in shrubs and grassy areas. Ticks can climb onto your body when you make contact with leaves or grass where the tick is waiting. The most common places on the body for ticks to attach themselves are the scalp, neck, armpits, waist, and groin. Most tick bites are harmless, but sometimes ticks carry germs that cause diseases. These germs can be spread to a person during the tick's feeding process. The chance of a disease spreading through a tick bite depends on:   The type of tick.  Time of year.   How long the tick is attached.   Geographic location.  HOW CAN YOU PREVENT TICK BITES? Take these steps to help prevent tick bites when you are outdoors:  Wear protective clothing. Long sleeves and long pants are best.   Wear white clothes so you can see ticks more easily.  Tuck your pant legs into your socks.   If walking on a trail, stay in the middle of the trail to avoid brushing against bushes.  Avoid walking through areas with long grass.  Put insect repellent on all exposed skin and along boot tops, pant legs, and sleeve cuffs.   Check clothing, hair, and skin repeatedly and before going inside.   Brush off any ticks that are not attached.  Take a shower or bath as soon as possible after being outdoors.  WHAT IS THE PROPER WAY TO REMOVE A TICK? Ticks should be removed as soon as possible to help prevent diseases caused by tick bites. 1. If latex gloves are available, put them on before trying to remove a tick.  2. Using fine-point tweezers, grasp the tick as close to the skin as possible. You may also use curved forceps or a tick removal tool. Grasp the tick as close to its head as possible. Avoid grasping the tick on its body. 3. Pull gently with steady upward pressure until  the tick lets go. Do not twist the tick or jerk it suddenly. This may break off the tick's head or mouth parts. 4. Do not squeeze or crush the tick's body. This could force disease-carrying fluids from the tick into your body.  5. After the tick is removed, wash the bite area and your hands with soap and water or other disinfectant such as alcohol. 6. Apply a small amount of antiseptic cream or ointment to the bite site.  7. Wash and disinfect any instruments that were used.  Do not try to remove a tick by applying a hot match, petroleum jelly, or fingernail polish to the tick. These methods do not work and may increase the chances of disease being spread from the tick bite.  WHEN SHOULD YOU SEEK MEDICAL CARE? Contact your health care provider if you are unable to remove a tick from your skin or if a part of the tick breaks off and is stuck in the skin.  After a tick bite, you need to be aware of signs and symptoms that could be related to diseases spread by ticks. Contact your health care provider if you develop any of the following in the days or weeks after the tick bite:  Unexplained fever.  Rash. A circular rash that appears days or weeks after the tick bite may indicate the possibility of Lyme disease. The rash may resemble   a target with a bull's-eye and may occur at a different part of your body than the tick bite.  Redness and swelling in the area of the tick bite.   Tender, swollen lymph glands.   Diarrhea.   Weight loss.   Cough.   Fatigue.   Muscle, joint, or bone pain.   Abdominal pain.   Headache.   Lethargy or a change in your level of consciousness.  Difficulty walking or moving your legs.   Numbness in the legs.   Paralysis.  Shortness of breath.   Confusion.   Repeated vomiting.  Document Released: 02/26/2000 Document Revised: 12/19/2012 Document Reviewed: 08/08/2012 [employer] Patient Information 2014 [employer].  

## 2013-07-31 NOTE — Progress Notes (Signed)
Subjective:     Patient ID: [name], female   DOB: 08/16/1975, 38 y.o.   MRN: [id number]  HPI 38 y.o. F presents for evaluation of diffuse muscular pain in arms and legs with fevers after being bitten by several tics in [place] this past month. Pt states that she had a targetoid lesion around the bites. Pt showed pictures of tics but did not have picutres of the lesions that have since resolved.   Pt is also out of diabetes medication. Will refill today  Review of Systems +f/c, hx of targetoid lesions, and diffuse muscle pain     Objective:   Physical Exam Filed Vitals:   07/31/13 1610  BP: 128/82  Pulse: 80  Temp: 98.4 F (36.9 C)   VSS NAD CN2-12 intact no facial droop Diffuse muscle soreness, 5/5 strength Skin exam shows several bite sites but without targetoid lesion at this time.     Assessment:     38 y.o. F with hx of recent tick exposure and concerning symptoms.  Will tx for possible Lyme disease even though low risk area. Pt will be treated for 10d doxycline 100mg  BID x10d. Follow up in 2 weeks.   Muscle soreness: possible related to above, could be related to diabetes and nephropathy. OA likely contributing. Unlikely rhabo for 1 month. Trial motrin for pain.  Visit completed with [place] interpreter.   Fredrik Rigger, MD OB Fellow

## 2013-08-06 ENCOUNTER — Telehealth: Payer: Self-pay | Admitting: *Deleted

## 2013-08-06 MED ORDER — GLIPIZIDE 10 MG PO TABS: 10.0000 mg | ORAL_TABLET | Freq: Two times a day (BID) | ORAL | Status: DC

## 2013-08-06 NOTE — Telephone Encounter (Signed)
Rx changed and faxed to health dept.  Thanks, [employer]. Jayd Forrey, M.D.

## 2013-08-06 NOTE — Telephone Encounter (Signed)
[facility]. Calling request to change glyburide to glipizide 10mg  since this is what they current have in stock. Rx recently prescribed by Dr. Leslie Andrea. They will need a new rx called or faxed in. Will forward to PCP and Dr. Leslie Andrea

## 2013-08-14 ENCOUNTER — Ambulatory Visit (INDEPENDENT_AMBULATORY_CARE_PROVIDER_SITE_OTHER): Payer: No Typology Code available for payment source | Admitting: Family Medicine

## 2013-08-14 ENCOUNTER — Encounter: Payer: Self-pay | Admitting: Family Medicine

## 2013-08-14 VITALS — BP 119/81 | HR 52 | Temp 98.5°F | Wt 204.0 lb

## 2013-08-14 DIAGNOSIS — T148 Other injury of unspecified body region: Secondary | ICD-10-CM

## 2013-08-14 DIAGNOSIS — W57XXXA Bitten or stung by nonvenomous insect and other nonvenomous arthropods, initial encounter: Secondary | ICD-10-CM

## 2013-08-14 NOTE — Assessment & Plan Note (Signed)
A: Resolved. Question if it is truly tick bites vs. Bed bugs  P: - Continue hydration of skin - Use steroid cream if needed for itching - F/u as needed

## 2013-08-14 NOTE — Patient Instructions (Signed)
I will see you back in a few weeks to talk about your diabetes.  [name] [name], [name].

## 2013-08-14 NOTE — Progress Notes (Signed)
Patient ID: [name], female   DOB: November 15, 1975, 38 y.o.   MRN: [id number]    Subjective: HPI: Patient is a 38 y.o. female presenting to clinic today for follow up tick bites. Interpreter present for entire visit  Had tick bites a few weeks ago while in [place]. Was started on Doxy 2 weeks ago. Finished antibiotics and is feeling better. Still having itching all over. No redness around the bites, only scars. No fevers, no other rashes.   Patient states her children had the same thing and it came from a relatives house. This makes me think more that it is bedbugs rather than tick bites.  History Reviewed: Non smoker. Health Maintenance: Needs follow up for DM  ROS: Please see HPI above.  Objective: Office vital signs reviewed. BP 119/81  Pulse 52  Temp(Src) 98.5 F (36.9 C) (Oral)  Wt 204 lb (92.534 kg)  LMP 08/11/2013  Physical Examination:  General: Awake, alert. NAD HEENT: Atraumatic, normocephalic. MMM Skin: Hyperpigmented flat macules on forearms with excoriations. Diffusely dry skin Neuro: Grossly intact  Assessment: 38 y.o. female follow up  Plan: See Problem List and After Visit Summary

## 2013-08-20 ENCOUNTER — Encounter (HOSPITAL_COMMUNITY): Payer: Self-pay | Admitting: Family Medicine

## 2013-08-20 ENCOUNTER — Telehealth: Payer: Self-pay | Admitting: Family Medicine

## 2013-08-20 ENCOUNTER — Telehealth: Payer: Self-pay | Admitting: *Deleted

## 2013-08-20 ENCOUNTER — Other Ambulatory Visit (HOSPITAL_COMMUNITY)
Admission: RE | Admit: 2013-08-20 | Discharge: 2013-08-20 | Disposition: A | Discharge status: Home / Self Care | Payer: No Typology Code available for payment source | Source: Ambulatory Visit | Attending: Family Medicine | Admitting: Family Medicine

## 2013-08-20 ENCOUNTER — Ambulatory Visit (INDEPENDENT_AMBULATORY_CARE_PROVIDER_SITE_OTHER): Payer: No Typology Code available for payment source | Admitting: Family Medicine

## 2013-08-20 ENCOUNTER — Encounter: Payer: Self-pay | Admitting: Family Medicine

## 2013-08-20 VITALS — BP 111/77 | HR 73 | Temp 98.0°F | Wt 207.0 lb

## 2013-08-20 DIAGNOSIS — Z1151 Encounter for screening for human papillomavirus (HPV): Secondary | ICD-10-CM | POA: Insufficient documentation

## 2013-08-20 DIAGNOSIS — N898 Other specified noninflammatory disorders of vagina: Secondary | ICD-10-CM

## 2013-08-20 DIAGNOSIS — Z113 Encounter for screening for infections with a predominantly sexual mode of transmission: Secondary | ICD-10-CM | POA: Insufficient documentation

## 2013-08-20 DIAGNOSIS — N899 Noninflammatory disorder of vagina, unspecified: Secondary | ICD-10-CM

## 2013-08-20 DIAGNOSIS — Z20828 Contact with and (suspected) exposure to other viral communicable diseases: Secondary | ICD-10-CM

## 2013-08-20 DIAGNOSIS — Z124 Encounter for screening for malignant neoplasm of cervix: Secondary | ICD-10-CM | POA: Insufficient documentation

## 2013-08-20 DIAGNOSIS — E119 Type 2 diabetes mellitus without complications: Secondary | ICD-10-CM

## 2013-08-20 LAB — POCT GLYCOSYLATED HEMOGLOBIN (HGB A1C): Hemoglobin A1C: 13

## 2013-08-20 LAB — POCT WET PREP (WET MOUNT): Clue Cells Wet Prep Whiff POC: NEGATIVE

## 2013-08-20 LAB — RPR

## 2013-08-20 LAB — CBC
HEMATOCRIT: 40.1 % (ref 36.0–46.0)
HEMOGLOBIN: 13.4 g/dL (ref 12.0–15.0)
MCH: 26.6 pg (ref 26.0–34.0)
MCHC: 33.4 g/dL (ref 30.0–36.0)
MCV: 79.7 fL (ref 78.0–100.0)
Platelets: 192 10*3/uL (ref 150–400)
RBC: 5.03 MIL/uL (ref 3.87–5.11)
RDW: 15 % (ref 11.5–15.5)
WBC: 6.2 10*3/uL (ref 4.0–10.5)

## 2013-08-20 LAB — BASIC METABOLIC PANEL
BUN: 6 mg/dL (ref 6–23)
CALCIUM: 8.7 mg/dL (ref 8.4–10.5)
CO2: 27 mEq/L (ref 19–32)
Chloride: 99 mEq/L (ref 96–112)
Creat: 0.42 mg/dL — ABNORMAL LOW (ref 0.50–1.10)
GLUCOSE: 222 mg/dL — AB (ref 70–99)
Potassium: 3.7 mEq/L (ref 3.5–5.3)
SODIUM: 135 meq/L (ref 135–145)

## 2013-08-20 MED ORDER — CLOBETASOL PROPIONATE 0.05 % EX CREA: 1.0000 "application " | TOPICAL_CREAM | Freq: Two times a day (BID) | CUTANEOUS | Status: DC

## 2013-08-20 NOTE — Assessment & Plan Note (Addendum)
A: Reports itching. Exam concerning for lichen sclerosis vs. Yeast infection.  P: - Diflucan for yeast - Clobetasol prescribed for possible lichen however patient not able to afford it. Monitor for worsening symptoms - GC/CT, HIV, RPR today - Pap completed - F/u if fails to improve

## 2013-08-20 NOTE — Telephone Encounter (Signed)
Please let patient know she does have yeast infection. I sent Diflucan to the health department. Take it in addition to the other cream.  Jacion Dismore M. Carnell Casamento, M.D.

## 2013-08-20 NOTE — Assessment & Plan Note (Signed)
A: Not at goal. Endorses poor diet  P: - Con't current medications - Diet and exercise - Consider referral to education

## 2013-08-20 NOTE — Patient Instructions (Signed)
Use cream twice daily for itching. I will call you with any other results.  Keep taking your blood sugar medications. Try checking with [facility] about better control.  Follow up in 1-2 months.  Hamlin Devine M. Aul Mangieri, M.D.

## 2013-08-20 NOTE — Telephone Encounter (Signed)
Received fax from [facility] regarding Rx for Temovate 0.05% cream.  Pt can't afford medication and would like a change to Kenalog cream since pt was on medication in the past.  Per verbal Dr. Sheral Apley, will not change Rx; discontinue Temovate and have pt continue the Diflucan as prescribed.  Order given to Pharmacist at [facility].  Derl Barrow, RN

## 2013-08-20 NOTE — Progress Notes (Signed)
Patient ID: Fontaine Kossman, female   DOB: [birth year], 38 y.o.   MRN: [id number]    Subjective: HPI: Patient is a 38 y.o. female presenting to clinic today for follow up. Graciela present as interpreter for entire visit. Concerns today include   1. Diabetes: Feels like her sugars are elevated Taking medications: Metformin and Glyburide Side effects: None ROS: denies fever, chills, dizziness, LOC, polyuria, polydipsia, numbness or tingling in extremities or chest pain. Last eye exam: December 2014 Last foot exam: Needs today Nephropathy screen indicated?: Due today. Never been on ACE Last flu, zoster and/or pneumovax:   2. Vaginal itching: Reports vaginal itching and discharge. Bad odor. No new sexual contacts. No treatment. She has had yeast infections in the past. Reports possible STD exposure.  3. Health maintence Due for pap smear and labs today.  History Reviewed: Non smoker.  ROS: Please see HPI above.  Objective: Office vital signs reviewed. BP 111/77  Pulse 73  Temp(Src) 98 F (36.7 C) (Oral)  Wt 207 lb (93.895 kg)  LMP 08/11/2013  Physical Examination:  General: Awake, alert. NAD HEENT: Atraumatic, normocephalic. MMM Neck: No masses palpated. No LAD Pulm: CTAB, no wheezes Cardio: RRR, no murmurs appreciated Abdomen:+BS, soft, nontender, nondistended GU: Diffusely dry external and internal genitalia with mild hypopigmentation. Small amount of discharge noted in vault. Cervix visualized with dark blood at os. One very small white lesion noted at 11:00 position. Extremities: No edema Neuro: Grossly intact  Assessment: 38 y.o. female follow up  Plan: See Problem List and After Visit Summary

## 2013-08-20 NOTE — Telephone Encounter (Signed)
LM for patient through [place] interpreters to inform her that we sent another medication to her pharmacy.  Terrin Imparato,CMA

## 2013-08-21 LAB — HIV ANTIBODY (ROUTINE TESTING W REFLEX): HIV 1&2 Ab, 4th Generation: NONREACTIVE

## 2013-08-22 LAB — CYTOLOGY - PAP

## 2013-08-26 ENCOUNTER — Encounter: Payer: Self-pay | Admitting: Family Medicine

## 2013-09-11 DIAGNOSIS — Z9289 Personal history of other medical treatment: Secondary | ICD-10-CM

## 2013-09-11 HISTORY — DX: Personal history of other medical treatment: Z92.89

## 2013-10-08 ENCOUNTER — Ambulatory Visit (INDEPENDENT_AMBULATORY_CARE_PROVIDER_SITE_OTHER): Payer: No Typology Code available for payment source | Admitting: Family Medicine

## 2013-10-08 VITALS — BP 120/82 | HR 69 | Temp 97.4°F | Resp 18 | Wt 202.0 lb

## 2013-10-08 DIAGNOSIS — E119 Type 2 diabetes mellitus without complications: Secondary | ICD-10-CM

## 2013-10-08 DIAGNOSIS — R200 Anesthesia of skin: Secondary | ICD-10-CM

## 2013-10-08 DIAGNOSIS — L600 Ingrowing nail: Secondary | ICD-10-CM

## 2013-10-08 DIAGNOSIS — R209 Unspecified disturbances of skin sensation: Secondary | ICD-10-CM

## 2013-10-08 MED ORDER — LEVOFLOXACIN 500 MG PO TABS: 500.0000 mg | ORAL_TABLET | Freq: Every day | ORAL | Status: DC

## 2013-10-08 MED ORDER — ASPIRIN EC 81 MG PO TBEC: 81.0000 mg | DELAYED_RELEASE_TABLET | Freq: Every day | ORAL | Status: DC

## 2013-10-08 MED ORDER — ATORVASTATIN CALCIUM 40 MG PO TABS: 40.0000 mg | ORAL_TABLET | Freq: Every day | ORAL | Status: DC

## 2013-10-08 NOTE — Assessment & Plan Note (Signed)
Right big toe with ingrown nail and paronychia. Given patient with uncontrolled DM and with pus from lesion will start on Levaquin to treat infection. Patient will need to have this nail removed. This is to be scheduled for some time in the next week. Patient to follow-up within the week for removal.

## 2013-10-08 NOTE — Progress Notes (Signed)
Patient ID: [name], female   DOB: 1975/04/25, 38 y.o.   MRN: [id number]  [name], MD Phone: [phone]  [name] is a 38 y.o. female who presents today for f/u.  DIABETES Disease Monitoring: Blood Sugar ranges-she has not been checking her sugars  Polyuria/phagia/dipsia- yes      Medications: Compliance- taking Hypoglycemic symptoms- intermittent, eats chocolate for this  Right toe pain: has been there for the past week. There has been swelling and pus draining from this. She has been cleaning this with peroxide. She has been applying antibiotic ointment to the area. It has not been improving or getting worse, though continues to intermittently drain. Has pain when she walks on that foot. She denies fevers.  Right sided numbness and weakness: patient states she had an episode of this 2-3 weeks ago. This lasted for 3.5 days. It was associated with headache, photophobia, phonophobia. She had to stay in a dark room. She noted the weakness was to the point that she could not hold herself up with her right leg. She had numbness on the right side with this weakness. She has never had a HA previously. She has never had an episode like this previously. She is not currently on a statin. She does not take an ASA. She reports her symptoms have resolved.   ROS: Per HPI   Physical Exam Filed Vitals:   10/08/13 1603  BP: 120/82  Pulse: 69  Temp: 97.4 F (36.3 C)  Resp: 18    Gen: Well NAD HEENT: PERRL,  MMM Lungs: CTABL Nl WOB Heart: RRR no MRG MSK: right big toe noted to be tender on the medial aspect of the nail, there is good ROM in the toe Neuro: difficulty hearing finger rub on right and decreased sensation to cold on right face, otherwise CN 2-12 intact, 5/5 strength in bilateral biceps, triceps, grip, quads, hamstrings, plantar and dorsiflexion, sensation to light touch intact bilaterally, absent patellar reflexes bilaterally, normal monofilament  bilaterally, negative romberg, no pronator drift, normal finger to nose Skin: right big toe with medial aspect of nail ingrown and small area of broken down skin with surrounding swelling, no surrounding erythema noted Exts: Non edematous BL  LE, warm and well perfused.    Assessment/Plan: Please see individual problem list.  # Healthcare maintenance: needs lipid panel and PNA vaccine, foot exam done today

## 2013-10-08 NOTE — Assessment & Plan Note (Addendum)
Patient with episode of numbness and weakness of right side of body that resolved 2 weeks ago. Her symptoms have resolved at this time. Will plan to have the patient get an echo and carotid dopplers. These have been ordered at this time and patient will be contacted about scheduling these. Will not obtain an MRI brain at this time given the event was 2 weeks ago and patient had resolution of symptoms and this will not affect out treatment plan of risk factor modification. Will place patient on lipitor 40 mg daily. Patient is to start ASA 81 mg daily. Given return precautions. F/u after completion of echo and carotid dopplers.

## 2013-10-08 NOTE — Patient Instructions (Signed)
Nice to meet you. If you have further numbness or weakness please go to the emergency room. Please schedule an appointment as soon as possible for removal of your toenail. Please also schedule an appointment for as soon as possible to see Dr Valentina Lucks in the pharmacy clinic. If you develop fever please let us know.   Ictus isqumico (Ischemic Stroke) Un ictus (accidente cerebrovascular) es la muerte sbita del tejido cerebral. Esto es una emergencia mdica. Un ictus puede causar una prdida permanente de la actividad cerebral. Esto puede causar problemas con diferentes partes del cuerpo. Un ataque isqumico transitorio (AIT) es diferente porque no causa un dao permanente. Un AIT es un problema, de corta duracin, de flujo sanguneo deficiente, que afecta a una parte del cerebro. Un AIT tambin es un problema serio porque aumenta considerablemente las probabilidades de sufrir un ictus. Cuando los sntomas se presentan por primera vez, es imposible saber si se trata de un ictus o un AIT. CAUSAS  Un ictus se produce debido a una disminucin del suministro de oxgeno a una zona del cerebro. Generalmente, es el resultado de un pequeo cogulo sanguneo o acumulacin de colesterol o grasa (placa) que bloquea el flujo sanguneo en el cerebro. Un ictus tambin puede producirse por una obstruccin o dao en las arterias cartidas.  FACTORES DE RIESGO  Presin arterial elevada (hipertensin).  Colesterol elevado.  Diabetes mellitus.  Enfermedades cardacas.  La acumulacin de placa en los vasos sanguneos (enfermedad arterial perifrica o ateroesclerosis).  La acumulacin de placa en los vasos sanguneos que proveen sangre y oxgeno al cerebro (estenosis de la arteria cartida).  Ritmo cardaco anormal (fibrilacin auricular).  La obesidad.  Fumar.  Consumir anticonceptivos orales, especialmente en combinacin con el hbito de fumar.  Falta de actividad fsica.  Dieta rica en grasas, sal  (sodio) y muchas caloras.  Consumo de alcohol.  Consumo de drogas ilegales (especialmente cocana y metanfetamina).  Ser de Mining engineer.  Tener ms de 55aos.  Antecedentes familiares de ictus.  Historia previa de cogulos sanguneos, ictus, ataque isqumico transitorio o infarto.  Anemia drepanoctica. SNTOMAS  Estos sntomas por lo general aparecen en forma repentina, o podran aparecer al despertar:  Debilidad o adormecimiento sbito de la cara, el brazo o la pierna, especialmente en un lado del cuerpo.  Dificultad repentina para caminar o para mover los brazos o las piernas.  Confusin sbita.  Cambios de personalidad sbitos.  Dificultad para hablar (afasia) o comprender.  Dificultad para tragar.  Dificultad sbita en la visin de uno o ambos ojos.  Visin doble.  Mareos.  Prdida del equilibrio o de la coordinacin.  Dolor de [place] repentino sin causa aparente.  Dificultad para leer o escribir. DIAGNSTICO  El mdico podr determinar la presencia o ausencia de un ictus en funcin de los sntomas, antecedentes y del examen fsico. Malo, se realiza una tomografa computarizada (TC) del cerebro para confirmar el ictus, determinar las causas y la gravedad. Se pueden realizar otros estudios para hallar la causa del ictus. Estos estudios pueden incluir:  Aeronautical engineer.  Monitorizacin electrocardiogrfica continua:  Ecocardiograma.  Ecografa de la cartida.  Imgenes por Health visitor (IRM).  Estudio de la circulacin cerebral.  Anlisis de [place]. [place] sufrir un ictus puede disminuir al tratar, de manera apropiada, la hipertensin arterial, el colesterol alto, la diabetes, la enfermedad cardaca y la obesidad, y al dejar de fumar, limitar el consumo de alcohol y Buyer, retail fsicamente [place]. [place]  que busque tratamiento ante Software engineer signo de estos sntomas  porque podr recibir un medicamento para Surveyor, mining (tromboltico), que no puede administrarse si ha pasado demasiado tiempo desde el comienzo de los sntomas. Aunque no sepa cundo comenzaron los sntomas, obtenga tratamiento lo antes posible, dado que hay otras opciones de tratamiento disponibles, que incluyen oxgeno, lquidos intravenosos (IV) y anticoagulantes. El tratamiento del ictus depende de la duracin, la gravedad y la causa de los sntomas. Pueden usarse medicamentos e incluirse cambios en la dieta para tratar la diabetes, la hipertensin arterial y 36 factores de [place]. Los fisioterapeutas y terapeutas del habla y ocupacionales lo evaluarn y trabajarn con usted para mejorar las funciones deterioradas por el ictus. Se tomarn medidas para prevenir complicaciones a corto y Barrister's clerk, que incluyen infecciones por Naval architect extrao en los pulmones (neumona por aspiracin), cogulos sanguneos en las piernas, escaras y cadas. Pocas veces se requiere Ardelia Mems ciruga para extraer grandes cogulos de sangre o abrir arterias bloqueadas. [place] los medicamentos solamente como se lo haya indicado el mdico. Siga cuidadosamente las indicaciones. Podrn administrarle medicamentos para controlar los factores de riesgo del ictus. Asegrese de comprender todas las indicaciones en relacin a la administracin de los medicamentos.  Tal vez le indiquen que tome un medicamento para Building surveyor la [place], como la aspirina o el anticoagulante warfarina. Es necesario que reciba la warfarina exactamente segn las indicaciones.  Las dosis altas y bajas de warfarina son peligrosas. El exceso de warfarina aumenta el riesgo de [place]. Dosis demasiado bajas de warfarina aumentan el riesgo de formacin de cogulos. Durante el tratamiento con warfarina, es necesario que se realicen peridicamente anlisis de sangre que miden el tiempo de Proofreader. Los anlisis de  sangre incluirn el TP y el INR. Los resultados del [place] y del INR permiten al mdico ajustar la dosis de warfarina. La dosis puede cambiarse por varias razones. Es de suma importancia que tome la warfarina exactamente como se le prescribi, y que tenga los niveles de [place] y de INR exactamente como se le indic.  Muchos de los alimentos, especialmente los que contienen vitamina K pueden interferir con la warfarina y Print production planner los resultados del [place] y del INR. Los alimentos con alto contenido de vitamina K incluyen espinaca, col rizada, brcoli, repollo, acelga y nabos verdes, repollitos de [place], guisantes, coliflor, algas, perejil, hgado de cerdo y de [place], t verde y Scientist, research (life sciences) de soja. Debe consumir siempre una buena cantidad de alimentos ricos en vitamina K. Evite los [employer] en su dieta, o informe a su mdico antes de cambiar su dieta. Solicite una cita con un nutricionista para hacerle las preguntas que le surjan.  Muchos medicamentos interfieren con la warfarina y [employer] del [place] y del INR. Debe informarle a su mdico acerca de todos los medicamentos que tome. Esto incluye todas las vitaminas y suplementos. Tenga especial cuidado con la aspirina y los medicamentos antiinflamatorios. No tome ni suspenda ningn medicamento tanto recetado como de [employer], excepto si se lo indica el mdico o el farmacutico.  La warfarina puede tener [place], tales como hematomas o sangrado en exceso. Si se lastima, deber ejercer presin sobre las heridas por ms tiempo que lo habitual. Su mdico o su Development worker, international aid comentarn otros posibles efectos secundarios.  Evite practicar deportes que puedan causar lesiones o sangrado.  Sea cuidadoso al [employer], Risk manager hilo dental, o manipular objetos cortantes.  El alcohol puede modificar  la capacidad del organismo de metabolizar la warfarina. Es Museum/gallery conservator las bebidas alcohlicas o consumir solo cantidades muy pequeas cuando se  est en tratamiento con warfarina. Hgale saber a su mdico si modifica su consumo de alcohol.  Informe a su dentista o a otros profesionales antes de que le efecten cualquier procedimiento.  Si los estudios han determinado la presencia de su reflejo de deglucin, debera ingerir alimentos saludables. Una dieta que incluya 5o ms porciones de frutas y vegetales por da puede reducir el riesgo de ictus. Los alimentos deben tener una consistencia especial (blandos o hechos pur) o los bocados deben ser pequeos para no aspirarse o ahogarse. Se pueden indicar determinados cambios alimenticios para controlar la hipertensin arterial, los niveles elevados de colesterol, la diabetes o la obesidad.  Para controlar la hipertensin arterial, se recomiendan alimentos con bajo contenido de sodio, grasas saturadas, grasas (trans) y colesterol.  Los niveles de colesterol se pueden Chief Technology Officer con alimentos que tienen alto contenido de [place] y bajo contenido de grasas saturadas, grasas trans y Research officer, trade union.  Para controlar la diabetes, se recomienda controlar el consumo de carbohidratos y Location manager.  Para controlar la obesidad, se recomienda reducir el consumo de caloras y elegir alimentos con bajo contenido de sodio, grasas saturadas, grasas trans y colesterol.  Mantenga un peso saludable.  Mantngase fsicamente activo. Se recomienda que realice al menos 78IONGEXB de [place] todos o casi [employer].  No consuma ningn producto que contenga tabaco, como cigarrillos, tabaco de Higher education careers adviser o Psychologist, sport and exercise.  Limite el consumo de alcohol aunque no tome warfarina. Se considera consumo moderado:  No ms de 2 medidas por da para los hombres.  No ms de 1 medida por da para las mujeres que no estn [place].  La seguridad en el hogar. Un ambiente seguro en el hogar es importante para reducir el riesgo de cadas. El mdico podr disponer que los especialistas evalen su hogar. Tambin es Astronomer barras para sostn en la habitacin y el bao. El Buyer, retail un equipo para Occupational psychologist, como un inodoro elevado y un asiento para la ducha.  Terapia fsica, ocupacional y del habla. Tal vez sea necesario realizar un tratamiento continuo para optimizar la recuperacin despus de un ictus. Si le aconsejaron el uso de un bastn o andador, selo en todo momento. Concurra a todas las sesiones teraputicas.  Siga todas las indicaciones en cuanto a los controles con el mdico. Esto es muy importante. Aqu se incluyen interconsultas, fisioterapia, rehabilitacin y anlisis de laboratorio. El control apropiado puede evitar que se produzca otro ictus. SOLICITE ATENCIN MDICA SI:  Hay cambios en su personalidad.  Tiene dificultad para tragar.  Tiene visin doble  Tiene mareos.  Tiene fiebre.  Tiene lceras en la piel. SOLICITE ATENCIN MDICA DE INMEDIATO SI:  Cualquiera de estos sntomas puede representar un problema grave y es Engineer, maintenance (IT). No espere a que los sntomas desaparezcan. Solicite atencin mdica de inmediato. Comunquese con el servicio de emergencias de su localidad (911 en los [place]). No conduzca por sus propios medios [employer].  Siente debilidad o adormecimiento sbito de la cara, el brazo o la pierna, especialmente en un lado del cuerpo.  Tiene una dificultad repentina para caminar o para mover los brazos o las piernas.  Se siente repentinamente confundido.  Tiene dificultad para hablar (afasia) o comprender.  Presenta, sbitamente, dificultad para ver de uno o ambos ojos.  Pierde el equilibrio o la coordinacin.  Siente sbitamente  un dolor de cabeza intenso que no tiene causa aparente.  Siente un nuevo dolor en el pecho o latidos cardacos irregulares.  Sufre la prdida parcial o total de la conciencia. Document Released: 12/08/2004 Document Revised: 07/15/2013 [facility] Patient Information 2015 [place]. This  information is not intended to replace advice given to you by your health care provider. Make sure you discuss any questions you have with your health care provider.

## 2013-10-08 NOTE — Assessment & Plan Note (Signed)
VERY poorly controlled. A1c was 13 in June. She is currently on PO medications only. She needs to start on insulin. I will have her see Dr Valentina Lucks in pharmacy clinic as she will likely require extensive teaching for this initiation of insulin to take place. This may be especially complicated with her requiring an interpretor. She will hopefully see Dr Valentina Lucks in the next week for this. F/u with me after this pharmacy visit takes place.

## 2013-10-09 ENCOUNTER — Telehealth: Payer: Self-pay | Admitting: Family Medicine

## 2013-10-09 ENCOUNTER — Ambulatory Visit (HOSPITAL_COMMUNITY): Payer: No Typology Code available for payment source | Attending: Family Medicine

## 2013-10-09 NOTE — Telephone Encounter (Signed)
Returned call to regarding vomiting blood.  Pt stated she only vomit one time and it all blood.  Pt also complained of right leg numbness/tingling and headache.  Pt denies any abdominal pain, blurred vision, chest pain or dizziness.  Precepted with Dr. Gwendlyn Deutscher; have pt come into clinic for evaluation.  Pt informed that she needed to be seen for symptoms, pt stated she did not have a ride.  She has an appt 10/10/2013 for follow and will keep that appt.  Advised she should be evaluated; she should call 911 or go urgent care if symptoms persist or worsen.  Pt stated understanding.  Derl Barrow, RN

## 2013-10-09 NOTE — Telephone Encounter (Signed)
Tried to return pt's call, unable to leave voice message.  Derl Barrow, RN

## 2013-10-09 NOTE — Telephone Encounter (Signed)
Patient is vomiting blood. Was just seen by [facility] yesterday. Please call patient.

## 2013-10-10 ENCOUNTER — Encounter: Payer: Self-pay | Admitting: Family Medicine

## 2013-10-10 ENCOUNTER — Ambulatory Visit (INDEPENDENT_AMBULATORY_CARE_PROVIDER_SITE_OTHER): Payer: No Typology Code available for payment source | Admitting: Family Medicine

## 2013-10-10 ENCOUNTER — Ambulatory Visit (HOSPITAL_COMMUNITY)
Admission: RE | Admit: 2013-10-10 | Discharge: 2013-10-10 | Disposition: A | Discharge status: Home / Self Care | Payer: No Typology Code available for payment source | Source: Ambulatory Visit | Attending: Family Medicine | Admitting: Family Medicine

## 2013-10-10 VITALS — BP 110/70 | HR 76 | Temp 98.6°F | Ht 59.0 in | Wt 202.0 lb

## 2013-10-10 DIAGNOSIS — R209 Unspecified disturbances of skin sensation: Secondary | ICD-10-CM | POA: Insufficient documentation

## 2013-10-10 DIAGNOSIS — IMO0002 Reserved for concepts with insufficient information to code with codable children: Secondary | ICD-10-CM

## 2013-10-10 DIAGNOSIS — R200 Anesthesia of skin: Secondary | ICD-10-CM

## 2013-10-10 DIAGNOSIS — R300 Dysuria: Secondary | ICD-10-CM

## 2013-10-10 DIAGNOSIS — M792 Neuralgia and neuritis, unspecified: Secondary | ICD-10-CM

## 2013-10-10 MED ORDER — GABAPENTIN 100 MG PO CAPS: 100.0000 mg | ORAL_CAPSULE | Freq: Three times a day (TID) | ORAL | Status: DC

## 2013-10-10 MED ORDER — AMITRIPTYLINE HCL 25 MG PO TABS: 25.0000 mg | ORAL_TABLET | Freq: Every day | ORAL | Status: DC
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
## 2013-10-10 NOTE — Progress Notes (Signed)
  Echocardiogram 2D Echocardiogram has been performed.  [name] 10/10/2013, 11:13 AM
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
## 2013-10-11 ENCOUNTER — Telehealth: Payer: Self-pay | Admitting: Family Medicine

## 2013-10-11 DIAGNOSIS — T7411XA Adult physical abuse, confirmed, initial encounter: Secondary | ICD-10-CM
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
## 2013-10-14 ENCOUNTER — Encounter: Payer: Self-pay | Admitting: Family Medicine

## 2013-10-14 ENCOUNTER — Ambulatory Visit (INDEPENDENT_AMBULATORY_CARE_PROVIDER_SITE_OTHER): Payer: No Typology Code available for payment source | Admitting: Family Medicine

## 2013-10-14 VITALS — BP 116/80 | HR 66 | Temp 98.3°F | Wt 202.0 lb

## 2013-10-14 DIAGNOSIS — L600 Ingrowing nail: Secondary | ICD-10-CM

## 2013-10-14 NOTE — Patient Instructions (Addendum)
Epsom salts - soak your foot tomorrow in bath.  Ibuprofen for pain, 200-400mg  every 6 hours as needed
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
## 2013-10-14 NOTE — Progress Notes (Signed)
Patient ID: [name], female   DOB: 01/21/1976, 38 y.o.   MRN: [id number]   Subjective:    Patient ID: [name], female    DOB: 03-Feb-1976, 38 y.o.   MRN: [id number]  HPI  CC: toenail removal  # Right great toenail removal:  Had infection from suspected ingrown toe nail, given levaquin antibiotic therapy and asked to come back for removal  Pain is improved, but still present and patient desires full toe nail removal ROS: no fevers/chills  Review of Systems   See HPI for ROS. Objective:  BP 116/80  Pulse 66  Temp(Src) 98.3 F (36.8 C) (Oral)  Wt 202 lb (91.627 kg)  General: NAD Extremities: right great toe with thick nail consistent with onychomycosis, medial aspect of toe with some erythema and desquamating skin.  Right great toe removal for: Onychomycosis.  Patient given informed consent, signed copy in the chart. Appropriate time out taken.  Large rubberband tightened with forceps used as tourniquet placed at base of the toe.  Area prepped and draped in usual sterile fashion.  Digital block done using 2% lidocaine without epinephrine, 4cc total.  Nail elevated using nail elevator, removed using hemostats and traction force.  Nail bed was ablated using curettage.  No complications.  Minimal bleeding.  Good capillary refill post-procedure. Sterile bandage applied and post procedure instructions given.  Patient tolerated procedure well without complications.     Assessment & Plan:  See Problem List Documentation

## 2013-10-14 NOTE — Telephone Encounter (Signed)
CSW contacted pt however pt stated she was unavailable at the moment but would contact CSW back later today.  Hunt Oris, MSW, [place]

## 2013-10-14 NOTE — Assessment & Plan Note (Signed)
Right great toenail complete removal in office today. Patient tolerated well. Plan: postcare instructions, gauze bandage and antibiotic ointment, epsom salts baths tomorrow.

## 2013-10-21 ENCOUNTER — Ambulatory Visit (INDEPENDENT_AMBULATORY_CARE_PROVIDER_SITE_OTHER): Payer: No Typology Code available for payment source | Admitting: Pharmacist

## 2013-10-21 ENCOUNTER — Encounter: Payer: Self-pay | Admitting: Pharmacist

## 2013-10-21 ENCOUNTER — Ambulatory Visit (INDEPENDENT_AMBULATORY_CARE_PROVIDER_SITE_OTHER): Payer: No Typology Code available for payment source | Admitting: Family Medicine

## 2013-10-21 ENCOUNTER — Encounter: Payer: Self-pay | Admitting: Family Medicine

## 2013-10-21 VITALS — BP 143/96 | HR 71 | Ht 61.0 in | Wt 205.2 lb

## 2013-10-21 VITALS — BP 110/82 | HR 68 | Temp 98.7°F | Ht 59.0 in | Wt 205.8 lb

## 2013-10-21 DIAGNOSIS — L600 Ingrowing nail: Secondary | ICD-10-CM

## 2013-10-21 DIAGNOSIS — M79609 Pain in unspecified limb: Secondary | ICD-10-CM

## 2013-10-21 DIAGNOSIS — F3289 Other specified depressive episodes: Secondary | ICD-10-CM

## 2013-10-21 DIAGNOSIS — E119 Type 2 diabetes mellitus without complications: Secondary | ICD-10-CM

## 2013-10-21 DIAGNOSIS — F329 Major depressive disorder, single episode, unspecified: Secondary | ICD-10-CM

## 2013-10-21 DIAGNOSIS — M79645 Pain in left finger(s): Secondary | ICD-10-CM

## 2013-10-21 DIAGNOSIS — F32A Depression, unspecified: Secondary | ICD-10-CM

## 2013-10-21 MED ORDER — GLUCOSE BLOOD VI STRP: ORAL_STRIP | Status: DC

## 2013-10-21 MED ORDER — "INSULIN SYRINGE-NEEDLE U-100 30G X 1/2"" 1 ML MISC": 1.0000 | Freq: Once | Status: DC

## 2013-10-21 MED ORDER — INSULIN NPH ISOPHANE & REGULAR (70-30) 100 UNIT/ML ~~LOC~~ SUSP: 4.0000 [IU] | Freq: Two times a day (BID) | SUBCUTANEOUS | Status: DC

## 2013-10-21 NOTE — Assessment & Plan Note (Signed)
Healing well after toenail removal.  Plan: pt given permission to wear shoes but to try to keep area dry to avoid fungal infection.

## 2013-10-21 NOTE — Assessment & Plan Note (Addendum)
Diabetes diagnosed in 2003 currently uncontrolled despite max dose metformin and glipizide.  Pt previously not well controlled due depression and anxiety. Reports psychiatrist initiated Zoloft and Ativan 3 days ago.  Control is suboptimal due to diabetes duration. Added basal insulin 70/30  8 units q am and 4 units q pm. Check BG 2x/day before each dose of insulin. Continue metformin. Stop glipizide. Counseled not to take insulin if BG <80; on s/sx of hypoglycemia; drinking 4 oz juice/soda or 1 piece of candy and then eating meal. Reports adherence with medication. [place] meter, strips, [place] [facility] [place] [facility].
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
## 2013-10-21 NOTE — Progress Notes (Signed)
S:    Patient arrives well-appearing female w/ spanish interpreter.   Presents for diabetes for medication management.   Patient reports having history of Diabetes since the year of 2003.  Patient has taken oral medication since 2003 (metformin, glyburide, switched to glipizide).  Reports dizziness with hypoglycemia (<90).   O:  . Lab Results  Component Value Date   HGBA1C 13.0 08/20/2013    Glucose meter is broken. Used to check 3 times a day some days but fingers hurt.   A/P: Diabetes diagnosed in 2003 currently uncontrolled despite max dose metformin and glipizide.  Pt previously not well controlled due depression and anxiety. Reports psychiatrist initiated Zoloft and Ativan 3 days ago.  Control is suboptimal due to diabetes duration. Added basal insulin 70/30  8 units q am and 4 units q pm. Check BG 2x/day before each dose of insulin. Continue metformin. Stop glipizide. Counseled not to take insulin if BG <80; on s/sx of hypoglycemia; drinking 4 oz juice/soda or 1 piece of candy and then eating meal. Reports adherence with medication. Called new meter, strips, and needles into the county pharmacy.   Discussed weight loss with the patient. Pt set goal of 65 kg. Encouraged continuing walking and zumba as exercise and to watch diet.   Next A1C anticipated 11/2013.  Written patient instructions provided.  Follow up in Pharmacist Clinic Visit 3 weeks.   Total time in face to face counseling 60 minutes.  Patient seen with Kurtis Bushman, PharmD Candidate.

## 2013-10-21 NOTE — Patient Instructions (Addendum)
Inyectas 8 unidadas insulina cada manana con comida y 4 unidadas insulina en la tarde con comida .   Insulin Lispro; Insulin Lispro Protamine injection Qu es este medicamento? La INSULINA LISPRO; INSULINA LISPRO PROTAMINA es una insulina de origen [place]. Este medicamento disminuye la cantidad de Dispensing optician. Este medicamento es una combinacin de una insulina de accin rpida y Ardelia Mems insulina de duracin prolongada. Comienza a actuar rpidamente despus de la inyeccin y contina a actuar hasta 12 a 24 horas. Este medicamento puede ser utilizado para otros usos; si tiene alguna pregunta consulte con su proveedor de atencin mdica o con su farmacutico. MARCAS COMERCIALES DISPONIBLES: Humalog KwikPen Mix 50/50, Humalog KwikPen Mix 75/25, Humalog Mix 50/50, Humalog Mix 75/25 Qu le debo informar a mi profesional de la salud antes de tomar este medicamento? Necesita saber si usted presenta alguno de los siguientes problemas o situaciones: -episodios de hipoglucemia -enfermedad renal -enfermedad heptica -una reaccin alrgica o inusual a la insulina, al metacresol, a otros medicamentos, alimentos, colorantes o conservantes -si est embarazada o buscando quedar embarazada -si est amamantando a un beb Cmo debo utilizar este medicamento? Este medicamento es para inyeccin por va subcutnea. Utilizar exactamente como indicado. Es importante seguir las instrucciones de su mdico o su profesional de Technical sales engineer. Debe inyectarse el medicamento dentro de 15 minutos antes o despus de una comida. Le ensearn cmo utilizar este medicamento y cmo [place] las dosis para actividades y [place]. No utilice ms insulina de la recetada. No utilice su medicamento con ms o menos frecuencia de lo recetado. Siempre chequee el aspecto de su insulina antes de [place]. Este medicamento debe ser blanco y turbio. No lo utilice si no es turbio de modo uniforme despus de Optician, dispensing. Para mezclar este  medicamento, ruede el frasco con cuidado 10 veces en sus manos. Si est utilizando la pluma desechable de Humalog Mix, ruede la pluma con cuidado 10 veces en sus manos. Despus, vuelva la pluma boca abajo para que la bola de vidrio se mueva de un extremo al otro de la pluma. Hago esto por lo menos 10 veces. Asegrese de seguir las instrucciones de Optician, dispensing antes de cada inyeccin. Adems, debe preparar la pluma antes para cada inyeccin. Su mdico o su educador de la diabetes le ensearn cmo utilizar la pluma. No mezcle este medicamento con otra insulina o diluyente. Es importante que deseche las agujas y las jeringas usadas en un recipiente resistente a los pinchazos. No las deseche en una basura. Si no tiene un recipiente resistente a los pinchazos, consulte a Midwife o su proveedor de atencin para obtenerlo. Hable con su pediatra para informarse acerca del uso de este medicamento en nios. Puede requerir atencin especial. Sobredosis: Pngase en contacto inmediatamente con un centro toxicolgico o una sala de urgencia si usted cree que haya tomado demasiado medicamento. ATENCIN: [employer] es solo para usted. No comparta este medicamento con nadie. Qu sucede si me olvido de una dosis? Es importante de no olvidar una dosis. Su profesional de la salud o su mdico debe hablar con usted acerca de algn plan para dosis olvidadas. Si olvida una dosis, siga su plan. No use dosis dobles. Qu puede interactuar con este medicamento? -otros medicamentos para la diabetes Muchos medicamentos pueden aumentar o reducir Retail buyer de Dispensing optician, tales como: -bebidas alcohlicas -aspirina y medicamentos tipo aspirina -cloranfenicol -cromo -diurticos -hormonas femeninas, como estrgenos, progestias o pldoras anticonceptivas -medicamentos para el corazn -isoniazida -hormonas masculinas o esteroides anablicos -  medicamentos para bajar de peso -medicamentos para alergias, asma,  resfros o tos -medicamentos para problemas mentales -medicamentos llamados inhibidores de la monoamina oxidasa (IMAO), tales como [place], Parnate, Marplan, Eldepryl -niacina -AINE, medicamentos para el dolor y la inflamacin, tales como ibuprofeno o naproxeno -pentamidina -fenitona -probenecid -antibiticos quinolnicos, tales como ciprofloxacina, levofloxacina, ofloxacina -algunos suplementos diteticos a base de hierbas -medicamentos esteroideos como la prednisona o la cortisona -medicamentos tiroideos Algunos medicamentos pueden disimular los sntomas de advertencia de bajo nivel de Dispensing optician. Tendr que controlar atentamente su nivel de azcar en la sangre si est tomando algunos de stos medicamentos, tales como: -beta-bloqueantes, tales como atenolol, metoprolol, propranolol -clonidina -guanetidina -reserpina Puede ser que esta lista no menciona todas las posibles interacciones. Informe a su profesional de [employer]	[place] de [employer] productos a base de hierbas, medicamentos de [place] o suplementos nutritivos que est tomando. Si usted fuma, consume bebidas alcohlicas o si utiliza drogas ilegales, indqueselo tambin a su profesional de [employer]	[place]. Algunas sustancias pueden interactuar con su medicamento. A qu debo estar atento al usar [employer]? Visite a su mdico o a su profesional de la salud para chequear su evolucin peridicamente. Un examen llamado HbA1C (A1C) ser monitoreado. Es un simple examen de [place]. Mide su control de azcar en la sangre durante los ltimos 2 a 3 meses. Usted recibir [employer] cada 3 a 6 meses. Aprenda cmo controlar el nivel de azcar en la sangre. Aprenda a reconocer los sntomas de bajo y alto nivel de azcar en la sangre y cmo tratarlos. Siempre lleve consigo una fuente rpida de azcar por si acaso experimenta sntomas de bajo nivel de azcar en la sangre. Ejemplos incluyen caramelos duros o tabletas de glucosa. Asegrese de que  los miembros de su familia sepan que se puede ahogar si come o bebe mientras tiene sntomas graves de bajo nivel de azcar en la sangre, tales como convulsiones o prdida del conocimiento. Deben obtener ayuda mdica inmediatamente. Informe a su mdico o a su profesional de la salud si su nivel de azcar en la sangre es alto. Tal vez sea necesario cambiar la dosis de su medicamento. Si est enfermo o haciendo mucho ms ejercicio que el habitual, puede ser necesario cambiar la dosis de su medicamento. No se salte comidas. Pregunte a su mdico o a su profesional de la salud si debe evitar el consumo de alcohol. Muchos productos de venta libre para tos y resfros contienen azcar y alcohol. Estos pueden Magazine features editor de azcar en la sangre. Asegrese que tiene la [place] correcta para el tipo de insulina que est utilizando. Trate de no cambiar de [place] o tipo de [place] o [place] a menos que as lo indica su mdico o su profesional de Technical sales engineer. El [place] de [place] o tipos puede causar un alto o bajo nivel peligroso de Dispensing optician. Siempre lleve a mano un suministro adicional de [place], [place] y [employer]. Utilice la jeringa sola una vez. Deseche la Rhea Bleacher y [place] en un envase cerrado para prevenir pinchazos accidentales de agujas. Nunca debe compartir las plumas o los cartuchos de [place]. Aun si cambia la aguja, compartiendo puede resultar en la propagacin de virus, tales como hepatitis o VIH. Use una pulsera o cadena de identificacin mdica que indique que tiene diabetes y lleve una tarjeta que indique todos sus medicamentos. Qu efectos secundarios puedo tener al [employer] este medicamento? Efectos secundarios que debe informar a su mdico o a Barrister's clerk de  la salud tan pronto como sea posible: -Chief of Staff como erupcin cutnea, picazn o urticarias, hinchazn de la cara, labios o lengua -problemas respiratorios -signos o sntomas de alto nivel de azcar en la sangre tales  como Emily, boca seca, piel seca, aliento con olor a frutas, nuseas, Orchid estomacales, aumento de sed o apetito, frecuentes descargas de orina -signos o sntomas de bajo nivel de azcar en la sangre tales como sentirse ansioso, confusin, mareos, aumento de apetito, [place], debilidad o cansancio inusual, sudoracin, temblores, fro, irritabilidad, dolor de cabeza, visin borrosa, pulso cardaco rpido, prdida del conocimiento Efectos secundarios que, por lo general, no requieren atencin mdica (debe informarlos a su mdico o su profesional de la salud si persisten o si son molestos): -aumento o disminucin de los tejidos de grasas debajo de la piel, debido a Company secretary de inyeccin en particular -picazn, ardor, hinchazn o erupcin en el lugar de inyeccin Puede ser que esta lista no menciona todos los posibles efectos secundarios. Comunquese a su mdico por asesoramiento mdico [employer]. Usted puede informar los efectos secundarios a la FDA por telfono al 1-800-FDA-1088. Dnde debo guardar mi medicina? Mantngala fuera del alcance de los nios. Guarde los frascos de insulina sin abrir en el refrigerador a una temperatura de [place] 2 y 28 grados C (54 y 22 grados F). No la congele o utilice si la insulina ha [place]. Los frascos abiertos (frascos que est utilizando actualmente) pueden guardarse en el refrigerador o a temperatura ambiente, aproximadamente a 30 grados C (86 grados F) o ms fresco. Theatre manager su insulina a temperatura ambiente disminuye la cantidad de dolor que experimenta durante la inyeccin. Una vez abierta, la insulina se puede utilizar por 28 das. Despus de [address], deseche el frasco de [place]. Guarde las plumas de Humalog Mix sin abrir en el refrigerador a una temperatura de [place] 2 y 17 grados C (35 y 62 grados F). No la congele o utilice si la insulina ha [place]. Una vez abierta, las plumas deben ser mantenidas a  temperatura ambiente, aproximadamente a 30 grados C ( 86 grados F) o ms fresco. No la Corporate investment banker. Una vez abierta, la insulina se puede utilizar por 10 das. Despus de [address] [place] plumas. Proteger de la luz y Printmaker. Deseche todo el medicamento que no haya utilizado, despus de la fecha de vencimiento o despus que haya pasado el tiempo especificado para guardar a Engineer, water. ATENCIN: Este folleto es un resumen. Puede ser que no cubra toda la posible informacin. Si usted tiene preguntas acerca de esta medicina, consulte con su mdico, su farmacutico o su profesional de Technical sales engineer.  2015, Elsevier/Gold Standard. (2013-05-24 10:50:51)

## 2013-10-21 NOTE — Patient Instructions (Signed)
For your toe:  You can start to wear shoes, make sure that you take them off to keep the toes dry if you are sweating.   For your hand:  Ice both sides of your hand 15 minutes 2-3 times a day Take ibuprofen or aleve for the pain and decrease inflammation Avoid squeezing your grip Stretch the hands after icing If it does not start to get better in 2 weeks, please call the clinic

## 2013-10-21 NOTE — Assessment & Plan Note (Signed)
Recently started 3 days prior sertraline and lorazepam
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
## 2013-10-22 NOTE — Progress Notes (Signed)
Patient ID: [name], female   DOB: 1976/02/12, 38 y.o.   MRN: [id number] Reviewed: Agree with documentation and management by Dr. Valentina Lucks.

## 2013-10-25 ENCOUNTER — Telehealth: Payer: Self-pay | Admitting: Clinical
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
## 2013-10-30 ENCOUNTER — Ambulatory Visit: Payer: No Typology Code available for payment source | Admitting: Family Medicine

## 2013-11-01 ENCOUNTER — Encounter: Payer: Self-pay | Admitting: Clinical

## 2013-11-01 NOTE — Progress Notes (Signed)
CSW met with pt and spouse to openly talk about some of the challenges pt has been experiencing as it relates to her anxiety, depression and diabetes. CSW encouraged pt to share how she has been feeling the past few weeks and whether she has noticed any changes since starting her medications (zoloft/ativan). Pt stated that she has not noticed any changes however has been working on deep breathing when she becomes anxious. Pt does state she has been more "down" and tired. She doesn't know whether these symptoms are related to the medication or related to depression. Pt's husband also states that he has noticed pt wanting to stay in the bed more and appearing sad. Spouse states that he understands that she is dealing with depressive symptoms and some anxiety and he wants to provide support as best as possible. CSW praised both pt and spouse for coming and being willing to work together to help pt over come these symptoms. CSW asked pt what she believes spouse could do to help her through this season. Pt sates that she doesn't like to be alone and feels very bored at home and therefore would appreciate her spouse taking her out when he gets home. Pt spouse agreed that this is a realistic request and that he would take pt to church on Sundays, walking or to his sisters house when available. Pt agreeable to suggestions and willing to try other activities (walking in the neighborhood, playing music at home, cook/clean etc.)   CSW also encouraged pt to start keeping a log of symptoms in order to determine whether the medication could be the cause of feeling overtired. Pt agreeable to speak to her therapist and PCP about these feelings.   Pt denied any si/si.  Hunt Oris, MSW, [place]

## 2013-11-07 ENCOUNTER — Encounter: Payer: Self-pay | Admitting: Family Medicine

## 2013-11-07 ENCOUNTER — Ambulatory Visit (INDEPENDENT_AMBULATORY_CARE_PROVIDER_SITE_OTHER): Payer: No Typology Code available for payment source | Admitting: Family Medicine

## 2013-11-07 VITALS — BP 100/72 | HR 72 | Temp 98.1°F | Ht 59.0 in | Wt 202.0 lb

## 2013-11-07 DIAGNOSIS — M546 Pain in thoracic spine: Secondary | ICD-10-CM

## 2013-11-07 DIAGNOSIS — K921 Melena: Secondary | ICD-10-CM

## 2013-11-07 DIAGNOSIS — E119 Type 2 diabetes mellitus without complications: Secondary | ICD-10-CM

## 2013-11-07 NOTE — Patient Instructions (Addendum)
Nice to meet you. We are going to send you to see a GI specialist for the bleeding. You should increase your insulin to 10 units in the morning and 6 units at night.  You can try tylenol for your back pain.  Diabetes mellitus tipo2 (Type 2 Diabetes Mellitus) La diabetes mellitus tipo2, generalmente denominada diabetes tipo2, es una enfermedad prolongada (crnica). En la diabetes tipo2, el pncreas no produce suficiente insulina (una hormona), las clulas son menos sensibles a la insulina que se produce (resistencia a la insulina), o ambos. Normalmente, la [employer] azcares de los alimentos a las clulas de los tejidos. Las clulas de los tejidos [employer] azcares para Dealer. La falta de insulina o la falta de una respuesta normal a la insulina hace que el exceso de azcar se acumule en la sangre en lugar de Location manager en las clulas de los tejidos. Como resultado, se producen niveles altos de Dispensing optician (hiperglucemia). El efecto de los niveles altos de azcar (glucosa) puede causar muchas complicaciones.  La diabetes tipo2 antes tambin se denominaba diabetes del [place], pero puede ocurrir a Hotel manager.  [place] persona tiene mayor predisposicin a desarrollar diabetes tipo 2 si alguien en su familia tiene la enfermedad y tambin tiene uno o ms de los siguientes factores de riesgo principales:  Sobrepeso.  Estilo de vida sedentario.  Una historia de consumo constante de alimentos de altas caloras. Mantener un peso saludable y realizar actividad fsica regular puede reducir la probabilidad de desarrollar diabetes tipo 2. SNTOMAS  Una persona con diabetes tipo 2 no presenta sntomas en un principio. Los sntomas de la diabetes tipo 2 aparecen lentamente. Los sntomas son:  Aumento de la sed (polidipsia).  Aumento de la miccin (poliuria).  Orina con ms frecuencia durante la noche (nocturia).  Prdida de peso. La prdida de peso  puede ser muy rpida.  Infecciones frecuentes y recurrentes.  Cansancio (fatiga).  Debilidad.  Cambios en la visin, como visin borrosa.  Olor a Medical illustrator.  Dolor abdominal.  Nuseas o vmitos.  Cortes o moretones que tardan en sanarse.  Hormigueo o adormecimiento de las manos y los pies. DIAGNSTICO Con frecuencia la diabetes tipo 2 no se diagnostica hasta que se presentan las complicaciones de la diabetes. La diabetes tipo 2 se diagnostica cuando los sntomas o las complicaciones se presentan y cuando aumentan los niveles de glucosa en la [place]. El nivel de glucosa en la sangre puede controlarse en uno o ms de los siguientes anlisis de sangre:  Medicin de glucosa en la sangre en [place]. No se le permitir comer durante al menos 8 horas antes de que se tome [place].  Pruebas al azar de glucosa en la sangre. El nivel de glucosa en la sangre se controla en cualquier momento del da sin importar el momento en que haya comido.  Prueba de A1c (hemoglobina glucosilada) Una prueba de A1c proporciona informacin sobre el control de la glucosa en la sangre durante los ltimos 3 meses.  Prueba de tolerancia a la glucosa oral (PTGO). La glucosa en la sangre se mide despus de no haber comido (ayunas) durante dos horas y despus de beber una bebida que contenga glucosa. TRATAMIENTO   Usted puede necesitar administrarse insulina o medicamentos para la diabetes todos los das para [employer] niveles de glucosa en la sangre en el rango deseado.  Si [place] insulina, tal vez necesite ajustar la dosis segn los  carbohidratos que haya consumido en cada comida o colacin. El objetivo del tratamiento es mantener el nivel de azcar en la sangre previo a comer (glucosa preprandial) entre 30 y 130mg /dl. INSTRUCCIONES PARA EL CUIDADO EN EL HOGAR   Controle su nivel de hemoglobina A1c dos veces al ao.  Contrlese a diario Retail buyer de glucosa en la sangre segn las  indicaciones de su mdico.  Supervise las cetonas en la orina cuando est enferma y segn las indicaciones de su [place] medicamento para la diabetes o adminstrese insulina segn las indicaciones de su mdico para Contractor nivel de glucosa en la sangre en el rango deseado.  Nunca se quede sin medicamento para la diabetes o sin insulina. Es necesario que la reciba [employer].  Si [place] insulina, tal vez deba ajustar la cantidad de insulina administrada segn los carbohidratos consumidos. Los hidratos de carbono pueden aumentar los niveles de glucosa en la sangre, pero deben incluirse en su dieta. Los hidratos de carbono aportan vitaminas, minerales y [place] que son Ardelia Mems parte esencial de una dieta saludable. Los hidratos de carbono se encuentran en frutas, verduras, cereales integrales, productos lcteos, legumbres y alimentos que contienen azcares aadidos.  Consuma alimentos saludables. Programe una cita con un nutricionista certificado para que lo ayude a Building services engineer de alimentacin adecuado para usted.  Baje de peso si es necesario.  Lleve una tarjeta de alerta mdica o use una pulsera o medalla de alerta mdica.  Lleve con usted una colacin de 15gramos de hidratos de carbono en todo momento para controlar los niveles bajos de glucosa en la sangre (hipoglucemia). Algunos ejemplos de colaciones de 15gramos de hidratos de carbono son los siguientes:  Tabletas de glucosa, 3 o 4.  Gel de glucosa, tubo de 15 gramos.  Pasas de uva, 2 cucharadas (24 gramos).  Caramelos de goma, 6.  Galletas de [place], 8.  Gaseosa comn, 4onzas (140mililitros).  Pastillas de goma, 9.  Reconocer la hipoglucemia. La hipoglucemia se produce cuando los niveles de glucosa en la sangre son de 70 mg/dl o menos. El riesgo de hipoglucemia aumenta durante el ayuno o cuando se saltea las comidas, durante o despus de Optometrist ejercicio intenso y [place] duerme. Los sntomas de  hipoglucemia son:  Temblores o sacudidas.  Disminucin de la capacidad de concentracin.  Sudoracin.  Aumento de la frecuencia cardaca.  Dolor de [place].  Sequedad en la boca.  Hambre.  Irritabilidad.  Ansiedad.  Sueo agitado.  Alteracin del habla o de la coordinacin.  Confusin.  Tratar la hipoglucemia rpidamente. Si usted est alerta y puede tragar con seguridad, siga la regla de 15/15 que consiste en:  [employer] 15 y 20gramos de glucosa de accin rpida o carbohidratos. Las opciones de accin rpida son un gel de glucosa, tabletas de glucosa, o 4 onzas (120 ml) de jugo de frutas, gaseosa comn, o leche baja en grasa.  Compruebe su nivel de glucosa en la sangre 15 minutos despus de tomar la glucosa.  Tome entre 15 y 57 gramos ms de glucosa si el nivel de glucosa en la sangre todava es de 70mg /dl o inferior.  Ingiera una comida o una colacin en el lapso de 1 hora una vez que los niveles de glucosa en la sangre vuelven a la normalidad.  Est atento a si siente mucha sed u orina con mayor frecuencia, porque son signos tempranos de hiperglucemia. El reconocimiento temprano de la hiperglucemia permite un tratamiento oportuno. Trate la hiperglucemia segn  le indic su mdico.  Haga, al menos, 122minutos de [place] fsica moderada a la semana, distribuidos en, por lo menos, 3das a la semana o como lo indique su mdico. Adems, debe realizar ejercicios de resistencia por lo menos 2veces a la semana o como lo indique su mdico. Trate de no permanecer inactivo durante ms de 39minutos seguidos.  Ajuste su medicamento y la ingesta de alimentos, segn sea necesario, si inicia un nuevo ejercicio o deporte.  Siga su plan para los das de enfermedad cuando no puede comer o beber como de [place].  No consuma ningn producto que contenga tabaco, como cigarrillos, tabaco de Higher education careers adviser o Psychologist, sport and exercise. Si necesita ayuda para dejar de fumar, hable con el  mdico.  Limite el consumo de alcohol a no ms de 1 medida por da en las mujeres no embarazadas y 2 medidas en los hombres. Debe beber alcohol solo mientras come. Hable con su mdico acerca de si el alcohol es seguro para usted. Informe a su mdico si bebe alcohol varias veces a la semana.  Concurra a todas las visitas de control como se lo haya indicado el mdico. Esto es importante.  Programe un examen de la vista poco despus del diagnstico de diabetes tipo 2 y luego anualmente.  Realice diariamente el cuidado de la piel y de los pies. Examine su piel y los pies diariamente para ver si tiene cortes, moretones, enrojecimiento, problemas en las uas, sangrado, ampollas o Pension scheme manager. Su mdico debe hacerle un examen de los pies una vez por ao.  Cepllese los dientes y encas por lo menos dos veces al da y use hilo dental al menos una vez por da. Concurra regularmente a las visitas de control con el dentista.  Comparta su plan de control de diabetes en el trabajo o en la escuela.  [place]. Se recomienda que las [employer] de 65aos con diabetes se apliquen la vacuna contra la neumona. En algunos casos, pueden administrarse dos inyecciones separadas. Pregntele al mdico si tiene la vacuna contra la neumona al da.  Aprenda a Engineer, maintenance (IT).  Obtenga la mayor cantidad posible de informacin sobre la diabetes y solicite ayuda siempre que sea necesario.  Busque programas de rehabilitacin y participe en ellos para mantener o mejorar su independencia y su calidad de vida. Solicite la derivacin a fisioterapia o terapia ocupacional si se le [employer] o la mano, o tiene problemas para asearse, vestirse, comer, o durante la [place] fsica. SOLICITE ATENCIN MDICA SI:   No puede comer alimentos o beber por ms de 6 horas.  Tuvo nuseas o ha vomitado durante ms de 6 horas.  Su nivel de glucosa en la sangre es mayor de 240 mg/dl.  Presenta algn  cambio en el estado mental.  Desarrolla una enfermedad grave adicional.  Tuvo diarrea durante ms de 6 horas.  Ha estado enfermo o ha tenido fiebre durante un par de das y no mejora.  Siente dolor al practicar cualquier actividad fsica. SOLICITE ATENCIN MDICA DE INMEDIATO SI:  Tiene dificultad para respirar.  Tiene niveles de cetonas moderados a altos. ASEGRESE DE QUE:  Comprende estas instrucciones.  Controlar su afeccin.  Recibir ayuda de inmediato si no mejora o si empeora. Document Released: 02/28/2005 Document Revised: 07/15/2013 [facility] Patient Information 2015 [place], [place]. This information is not intended to replace advice given to you by your health care provider. Make sure you discuss any questions you have with your health care provider.

## 2013-11-08 DIAGNOSIS — K921 Melena: Secondary | ICD-10-CM | POA: Insufficient documentation

## 2013-11-08 MED ORDER — INSULIN NPH ISOPHANE & REGULAR (70-30) 100 UNIT/ML ~~LOC~~ SUSP: 6.0000 [IU] | Freq: Two times a day (BID) | SUBCUTANEOUS | Status: DC

## 2013-11-08 NOTE — Progress Notes (Signed)
Patient ID: [name], female   DOB: 06-08-1975, 38 y.o.   MRN: [id number]  [name], MD Phone: [phone]  [name] is a 38 y.o. female who presents today for f/u.  DIABETES Disease Monitoring: Blood Sugar ranges-low to high 200's in am, 200-400's in pm  Polyuria/phagia/dipsia- no       Medications: Compliance- taking novolin 70/30 8 u in am and 4 u in pm and metformin  Hypoglycemic symptoms- no  Back pain: in mid back. Notes it only hurts after she walks for about 30 minutes. Then the pain will come on and last for 20 minutes. Sudden onset. Intermittent. Feels like a pinch. It goes away on its own. No fevers, urinary, or BM issues. She endorses numbness between her legs. No weakness in LE.  Bloody BM: notes several times over the past several weeks she has had blood in her stool. Notes this occurs with diarrhea. The blood has been on the stool and in the toilet water.  Patient is a nonsmoker.   ROS: Per HPI   Physical Exam Filed Vitals:   11/07/13 1007  BP: 100/72  Pulse: 72  Temp: 98.1 F (36.7 C)    FOBT positive  Gen: Well NAD HEENT: PERRL,  MMM Lungs: CTABL Nl WOB Heart: RRR no MRG MSK: there is no tenderness to palpation of the back at any point and no midline tenderness Neuro: CN 2-12 intact, 5/5 strength in bilateral biceps, triceps, grip, quads, hamstrings, plantar and dorsiflexion, sensation to light touch intact in bilateral UE and LE, normal gait, 2+ patellar reflexes, saddle sensation to light touch intact Rectal: normal rectal tone, no blood on glove, normal appearing stool Exts: Non edematous BL  LE, warm and well perfused.    Assessment/Plan: Please see individual problem list.  # Healthcare maintenance: lipid panel at next visit  [name], MD [place] PGY-3

## 2013-11-08 NOTE — Assessment & Plan Note (Signed)
FOBT positive with reported blood in stool. Will refer to GI through the orange card. Patient met with Pamala Hurry to set this up.

## 2013-11-08 NOTE — Assessment & Plan Note (Addendum)
Back pain is likely MSK in nature related to obesity and over use. She did mention numbness between her legs, though on exam she had good sensation and good rectal tone making a cauda equina syndrome unlikely. No other red flags on history or exam. She needs to lose weight for this pain to improve. She can use ibuprofen prn for this issue. F/u in 2 weeks to ensure improvement.

## 2013-11-08 NOTE — Assessment & Plan Note (Signed)
Not well controlled. Will increase insulin to 10 u in am and 6 u at night. Discussed checking cbg prior to eating. Discussed hypoglycemic symptoms. Will see in follow-up in 2 weeks to closely monitor this issue.

## 2013-11-14 ENCOUNTER — Ambulatory Visit: Payer: Self-pay | Admitting: Pharmacist

## 2013-11-25 ENCOUNTER — Ambulatory Visit: Payer: Self-pay

## 2013-11-26 ENCOUNTER — Ambulatory Visit: Payer: Self-pay | Admitting: Family Medicine

## 2013-12-03 ENCOUNTER — Ambulatory Visit: Payer: Self-pay

## 2013-12-06 ENCOUNTER — Emergency Department (HOSPITAL_COMMUNITY): Payer: No Typology Code available for payment source

## 2013-12-06 ENCOUNTER — Emergency Department (HOSPITAL_COMMUNITY)
Admission: EM | Admit: 2013-12-06 | Discharge: 2013-12-06 | Disposition: A | Discharge status: Home / Self Care | Payer: No Typology Code available for payment source | Attending: Emergency Medicine | Admitting: Emergency Medicine

## 2013-12-06 ENCOUNTER — Emergency Department (HOSPITAL_COMMUNITY): Payer: Self-pay

## 2013-12-06 ENCOUNTER — Encounter (HOSPITAL_COMMUNITY): Payer: Self-pay | Admitting: Emergency Medicine

## 2013-12-06 DIAGNOSIS — Y9389 Activity, other specified: Secondary | ICD-10-CM | POA: Insufficient documentation

## 2013-12-06 DIAGNOSIS — Z791 Long term (current) use of non-steroidal anti-inflammatories (NSAID): Secondary | ICD-10-CM | POA: Insufficient documentation

## 2013-12-06 DIAGNOSIS — Z79899 Other long term (current) drug therapy: Secondary | ICD-10-CM | POA: Insufficient documentation

## 2013-12-06 DIAGNOSIS — Y92410 Unspecified street and highway as the place of occurrence of the external cause: Secondary | ICD-10-CM | POA: Insufficient documentation

## 2013-12-06 DIAGNOSIS — S46909A Unspecified injury of unspecified muscle, fascia and tendon at shoulder and upper arm level, unspecified arm, initial encounter: Secondary | ICD-10-CM | POA: Insufficient documentation

## 2013-12-06 DIAGNOSIS — IMO0002 Reserved for concepts with insufficient information to code with codable children: Secondary | ICD-10-CM | POA: Insufficient documentation

## 2013-12-06 DIAGNOSIS — S4980XA Other specified injuries of shoulder and upper arm, unspecified arm, initial encounter: Secondary | ICD-10-CM | POA: Insufficient documentation

## 2013-12-06 DIAGNOSIS — S199XXA Unspecified injury of neck, initial encounter: Secondary | ICD-10-CM

## 2013-12-06 DIAGNOSIS — S298XXA Other specified injuries of thorax, initial encounter: Secondary | ICD-10-CM | POA: Insufficient documentation

## 2013-12-06 DIAGNOSIS — Z794 Long term (current) use of insulin: Secondary | ICD-10-CM | POA: Insufficient documentation

## 2013-12-06 DIAGNOSIS — E119 Type 2 diabetes mellitus without complications: Secondary | ICD-10-CM | POA: Insufficient documentation

## 2013-12-06 DIAGNOSIS — T07XXXA Unspecified multiple injuries, initial encounter: Secondary | ICD-10-CM

## 2013-12-06 DIAGNOSIS — S0993XA Unspecified injury of face, initial encounter: Secondary | ICD-10-CM | POA: Insufficient documentation

## 2013-12-06 DIAGNOSIS — S1093XA Contusion of unspecified part of neck, initial encounter: Principal | ICD-10-CM

## 2013-12-06 DIAGNOSIS — I10 Essential (primary) hypertension: Secondary | ICD-10-CM | POA: Insufficient documentation

## 2013-12-06 DIAGNOSIS — Z8719 Personal history of other diseases of the digestive system: Secondary | ICD-10-CM | POA: Insufficient documentation

## 2013-12-06 DIAGNOSIS — S0003XA Contusion of scalp, initial encounter: Secondary | ICD-10-CM | POA: Insufficient documentation

## 2013-12-06 DIAGNOSIS — S0083XA Contusion of other part of head, initial encounter: Principal | ICD-10-CM | POA: Insufficient documentation

## 2013-12-06 DIAGNOSIS — S0990XA Unspecified injury of head, initial encounter: Secondary | ICD-10-CM | POA: Insufficient documentation

## 2013-12-06 MED ORDER — IBUPROFEN 800 MG PO TABS
800.0000 mg | ORAL_TABLET | Freq: Once | ORAL | Status: AC
  Administered 2013-12-06: 800 mg via ORAL
  Filled 2013-12-06: qty 1

## 2013-12-06 MED ORDER — METHOCARBAMOL 500 MG PO TABS
500.0000 mg | ORAL_TABLET | Freq: Two times a day (BID) | ORAL | Status: DC
Start: 2013-12-06 — End: 2013-12-12

## 2013-12-06 MED ORDER — NAPROXEN 500 MG PO TABS: 500.0000 mg | ORAL_TABLET | Freq: Two times a day (BID) | ORAL | Status: DC

## 2013-12-06 MED ORDER — HYDROCODONE-ACETAMINOPHEN 5-325 MG PO TABS
2.0000 | ORAL_TABLET | ORAL | Status: DC | PRN
Start: 2013-12-06 — End: 2013-12-12

## 2013-12-06 MED ORDER — HYDROCODONE-ACETAMINOPHEN 5-325 MG PO TABS
2.0000 | ORAL_TABLET | Freq: Once | ORAL | Status: AC
  Administered 2013-12-06: 2 via ORAL
  Filled 2013-12-06: qty 2
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
## 2013-12-06 NOTE — Discharge Instructions (Signed)
Contusión °(Contusion) °Una contusión es un hematoma profundo. Las contusiones son el resultado de una lesión que causa sangrado debajo de la piel. La zona de la contusión puede ponerse azul, morada o amarilla. Las lesiones menores causarán contusiones sin dolor, pero las más graves pueden presentar dolor e inflamación durante un par de semanas.  °CAUSAS  °Generalmente, una contusión se debe a un golpe, un traumatismo o una fuerza directa en una zona del cuerpo. °SÍNTOMAS  °· Hinchazón y enrojecimiento en la zona de la lesión. °· Hematomas en la zona de la lesión. °· Dolor con la palpación y sensibilidad en la zona de la lesión. °· Dolor. °DIAGNÓSTICO  °Se puede establecer el diagnóstico al hacer una historia clínica y un examen físico. Tal vez sea necesario hacer una radiografía, una tomografía computarizada o una resonancia magnética para determinar si hay lesiones asociadas, como fracturas. °TRATAMIENTO  °El tratamiento específico dependerá de la zona del cuerpo donde se produjo la lesión. En general, el mejor tratamiento para una contusión es el reposo, la aplicación de hielo, la elevación de la zona y la aplicación de compresas frías en la zona de la lesión. Para calmar el dolor también podrán recomendarle medicamentos de venta libre. Pregúntele al médico cuál es el mejor tratamiento para su contusión. °INSTRUCCIONES PARA EL CUIDADO EN EL HOGAR  °· Aplique hielo sobre la zona lesionada. °¨ Ponga el hielo en una bolsa plástica. °¨ Colóquese una toalla entre la piel y la bolsa de hielo. °¨ Deje el hielo durante 15 a 20 minutos, 3 a 4 veces por día, o según las indicaciones del médico. °· Utilice los medicamentos de venta libre o recetados para calmar el dolor, el malestar o la fiebre, según se lo indique el médico. El médico podrá indicarle que evite tomar antiinflamatorios (aspirina, ibuprofeno y naproxeno) durante 48 horas ya que estos medicamentos pueden aumentar los hematomas. °· Mantenga la zona de la lesión  en reposo. °· Si es posible, eleve la zona de la lesión para reducir la hinchazón. °SOLICITE ATENCIÓN MÉDICA DE INMEDIATO SI:  °· El hematoma o la hinchazón aumentan. °· Siente dolor que empeora. °· La hinchazón o el dolor no se alivian con los medicamentos. °ASEGÚRESE DE QUE:  °· Comprende estas instrucciones. °· Controlará su afección. °· Recibirá ayuda de inmediato si no mejora o si empeora. °Document Released: 12/08/2004 Document Revised: 03/05/2013 °ExitCare® Patient Information ©2015 [employer]. This information is not intended to replace advice given to you by your health care provider. Make sure you discuss any questions you have with your health care provider. ° °
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
## 2013-12-12 ENCOUNTER — Encounter (HOSPITAL_COMMUNITY): Payer: Self-pay | Admitting: Emergency Medicine

## 2013-12-12 ENCOUNTER — Emergency Department (HOSPITAL_COMMUNITY)
Admission: EM | Admit: 2013-12-12 | Discharge: 2013-12-12 | Disposition: A | Discharge status: Home / Self Care | Payer: No Typology Code available for payment source | Attending: Emergency Medicine | Admitting: Emergency Medicine

## 2013-12-12 ENCOUNTER — Emergency Department (HOSPITAL_COMMUNITY): Payer: No Typology Code available for payment source

## 2013-12-12 DIAGNOSIS — R0600 Dyspnea, unspecified: Secondary | ICD-10-CM | POA: Diagnosis not present

## 2013-12-12 DIAGNOSIS — Z3202 Encounter for pregnancy test, result negative: Secondary | ICD-10-CM | POA: Diagnosis not present

## 2013-12-12 DIAGNOSIS — E119 Type 2 diabetes mellitus without complications: Secondary | ICD-10-CM | POA: Insufficient documentation

## 2013-12-12 DIAGNOSIS — Z9851 Tubal ligation status: Secondary | ICD-10-CM | POA: Diagnosis not present

## 2013-12-12 DIAGNOSIS — F329 Major depressive disorder, single episode, unspecified: Secondary | ICD-10-CM | POA: Insufficient documentation

## 2013-12-12 DIAGNOSIS — Z9889 Other specified postprocedural states: Secondary | ICD-10-CM | POA: Insufficient documentation

## 2013-12-12 DIAGNOSIS — R079 Chest pain, unspecified: Secondary | ICD-10-CM | POA: Diagnosis present

## 2013-12-12 DIAGNOSIS — R1013 Epigastric pain: Secondary | ICD-10-CM

## 2013-12-12 DIAGNOSIS — I10 Essential (primary) hypertension: Secondary | ICD-10-CM | POA: Diagnosis not present

## 2013-12-12 DIAGNOSIS — Z791 Long term (current) use of non-steroidal anti-inflammatories (NSAID): Secondary | ICD-10-CM | POA: Diagnosis not present

## 2013-12-12 DIAGNOSIS — Z79899 Other long term (current) drug therapy: Secondary | ICD-10-CM | POA: Insufficient documentation

## 2013-12-12 DIAGNOSIS — K920 Hematemesis: Secondary | ICD-10-CM | POA: Insufficient documentation

## 2013-12-12 LAB — CBC WITH DIFFERENTIAL/PLATELET
Basophils Absolute: 0 10*3/uL (ref 0.0–0.1)
Basophils Relative: 1 % (ref 0–1)
EOS ABS: 0.1 10*3/uL (ref 0.0–0.7)
Eosinophils Relative: 2 % (ref 0–5)
HCT: 36.7 % (ref 36.0–46.0)
HEMOGLOBIN: 12.5 g/dL (ref 12.0–15.0)
LYMPHS ABS: 1.6 10*3/uL (ref 0.7–4.0)
Lymphocytes Relative: 29 % (ref 12–46)
MCH: 28.1 pg (ref 26.0–34.0)
MCHC: 34.1 g/dL (ref 30.0–36.0)
MCV: 82.5 fL (ref 78.0–100.0)
MONO ABS: 0.4 10*3/uL (ref 0.1–1.0)
MONOS PCT: 6 % (ref 3–12)
NEUTROS PCT: 62 % (ref 43–77)
Neutro Abs: 3.6 10*3/uL (ref 1.7–7.7)
Platelets: 194 10*3/uL (ref 150–400)
RBC: 4.45 MIL/uL (ref 3.87–5.11)
RDW: 12.8 % (ref 11.5–15.5)
WBC: 5.8 10*3/uL (ref 4.0–10.5)

## 2013-12-12 LAB — COMPREHENSIVE METABOLIC PANEL
ALT: 21 U/L (ref 0–35)
AST: 22 U/L (ref 0–37)
Albumin: 3.7 g/dL (ref 3.5–5.2)
Alkaline Phosphatase: 100 U/L (ref 39–117)
Anion gap: 11 (ref 5–15)
BILIRUBIN TOTAL: 0.4 mg/dL (ref 0.3–1.2)
BUN: 9 mg/dL (ref 6–23)
CHLORIDE: 104 meq/L (ref 96–112)
CO2: 24 meq/L (ref 19–32)
Calcium: 8.9 mg/dL (ref 8.4–10.5)
Creatinine, Ser: 0.49 mg/dL — ABNORMAL LOW (ref 0.50–1.10)
GFR calc Af Amer: >90 mL/min (ref >90)
GFR calc non Af Amer: >90 mL/min (ref >90)
Glucose, Bld: 230 mg/dL — ABNORMAL HIGH (ref 70–99)
Potassium: 4.2 mEq/L (ref 3.7–5.3)
Sodium: 139 mEq/L (ref 137–147)
Total Protein: 7.1 g/dL (ref 6.0–8.3)

## 2013-12-12 LAB — URINALYSIS, ROUTINE W REFLEX MICROSCOPIC
Bilirubin Urine: NEGATIVE
Glucose, UA: 250 mg/dL — AB
HGB URINE DIPSTICK: NEGATIVE
Ketones, ur: NEGATIVE mg/dL
Leukocytes, UA: NEGATIVE
Nitrite: NEGATIVE
PH: 6 (ref 5.0–8.0)
Protein, ur: NEGATIVE mg/dL
Specific Gravity, Urine: 1.015 (ref 1.005–1.030)
Urobilinogen, UA: 0.2 mg/dL (ref 0.0–1.0)

## 2013-12-12 LAB — I-STAT CHEM 8, ED
BUN: 8 mg/dL (ref 6–23)
CREATININE: 0.5 mg/dL (ref 0.50–1.10)
Calcium, Ion: 1.13 mmol/L (ref 1.12–1.23)
Chloride: 104 mEq/L (ref 96–112)
GLUCOSE: 233 mg/dL — AB (ref 70–99)
HCT: 39 % (ref 36.0–46.0)
HEMOGLOBIN: 13.3 g/dL (ref 12.0–15.0)
Potassium: 4 mEq/L (ref 3.7–5.3)
Sodium: 136 mEq/L — ABNORMAL LOW (ref 137–147)
TCO2: 22 mmol/L (ref 0–100)

## 2013-12-12 LAB — LIPASE, BLOOD: Lipase: 28 U/L (ref 11–59)

## 2013-12-12 LAB — I-STAT TROPONIN, ED: Troponin i, poc: 0 ng/mL (ref 0.00–0.08)

## 2013-12-12 LAB — PREGNANCY, URINE: Preg Test, Ur: NEGATIVE

## 2013-12-12 LAB — I-STAT CG4 LACTIC ACID, ED: Lactic Acid, Venous: 2.24 mmol/L — ABNORMAL HIGH (ref 0.5–2.2)

## 2013-12-12 MED ORDER — IOHEXOL 300 MG/ML  SOLN
25.0000 mL | Freq: Once | INTRAMUSCULAR | Status: AC | PRN
  Administered 2013-12-12: 25 mL via ORAL

## 2013-12-12 MED ORDER — ONDANSETRON HCL 4 MG/2ML IJ SOLN
4.0000 mg | Freq: Once | INTRAMUSCULAR | Status: AC
  Administered 2013-12-12: 4 mg via INTRAVENOUS
  Filled 2013-12-12: qty 2

## 2013-12-12 MED ORDER — HYDROCODONE-ACETAMINOPHEN 5-325 MG PO TABS: 1.0000 | ORAL_TABLET | Freq: Four times a day (QID) | ORAL | Status: DC | PRN

## 2013-12-12 MED ORDER — PANTOPRAZOLE SODIUM 40 MG PO TBEC
40.0000 mg | DELAYED_RELEASE_TABLET | Freq: Every day | ORAL | Status: DC
  Administered 2013-12-12: 40 mg via ORAL
  Filled 2013-12-12: qty 1

## 2013-12-12 MED ORDER — LORAZEPAM 0.5 MG PO TABS: 0.5000 mg | ORAL_TABLET | Freq: Every day | ORAL | Status: DC | PRN

## 2013-12-12 MED ORDER — SODIUM CHLORIDE 0.9 % IV SOLN
Freq: Once | INTRAVENOUS | Status: AC
  Administered 2013-12-12: 08:00:00 via INTRAVENOUS

## 2013-12-12 MED ORDER — GI COCKTAIL ~~LOC~~
30.0000 mL | Freq: Once | ORAL | Status: AC
  Administered 2013-12-12: 30 mL via ORAL
  Filled 2013-12-12: qty 30

## 2013-12-12 MED ORDER — IOHEXOL 300 MG/ML  SOLN
80.0000 mL | Freq: Once | INTRAMUSCULAR | Status: AC | PRN
  Administered 2013-12-12: 80 mL via INTRAVENOUS

## 2013-12-12 MED ORDER — PANTOPRAZOLE SODIUM 40 MG PO TBEC: 40.0000 mg | DELAYED_RELEASE_TABLET | Freq: Every day | ORAL | Status: DC

## 2013-12-12 MED ORDER — HYDROMORPHONE HCL 1 MG/ML IJ SOLN
1.0000 mg | Freq: Once | INTRAMUSCULAR | Status: AC
  Administered 2013-12-12: 1 mg via INTRAVENOUS
  Filled 2013-12-12: qty 1

## 2013-12-12 MED ORDER — SODIUM CHLORIDE 0.9 % IV BOLUS (SEPSIS)
1000.0000 mL | Freq: Once | INTRAVENOUS | Status: AC
  Administered 2013-12-12: 1000 mL via INTRAVENOUS

## 2013-12-12 NOTE — Discharge Instructions (Signed)
Dolor abdominal en las mujeres °(Abdominal Pain, Women) °El dolor abdominal (en el estómago, la pelvis o el vientre) puede tener muchas causas. Es importante que le informe a su médico: °· La ubicación del dolor. °· ¿Viene y se va, o persiste todo el tiempo? °· ¿Hay situaciones que inician el dolor (comer ciertos alimentos, la actividad física)? °· ¿Tiene otros síntomas asociados al dolor (fiebre, náuseas, vómitos, diarrea)? °Todo es de gran ayuda cuando se trata de hallar la causa del dolor. °CAUSAS °· Estómago: Infecciones por virus o bacterias, o úlcera. °· Intestino: Apendicitis (apéndice inflamado), ileitis regional (enfermedad de Crohn), colitis ulcerosa (colon inflamado), síndrome del colon irritable, diverticulitis (inflamación de los divertículos del colon) o cáncer de estómago oo intestino. °· Enfermedades de la vesícula biliar o cálculos. °· Enfermedades renales, cálculos o infecciones en el riñón. °· Infección o cáncer del páncreas. °· Fibromialgia (trastorno doloroso) °· Enfermedades de los órganos femeninos: °¨ Uterus: Útero: fibroma (tumor no canceroso) o infección °¨ Trompas de Falop[place]: infección o embarazo ectópico °¨ En los ovarios, quistes o tumores. °¨ Adherencias pélvicas (tejido cicatrizal). °¨ Endometriosis (el tejido que cubre el útero se desarrolla en la pelvis y los órganos pélvicos). °¨ Síndrome de congestión pélvica (los órganos femeninos se llenan de sangre antes del periodo menstrual( °¨ Dolor durante el periodo menstrual. °¨ Dolor durante la ovulación (al producir óvulos). °¨ Dolor al usar el DIU (dispositivo intrauterino para el control de la natalidad) °¨ Cáncer en los órganos femeninos. °· Dolor funcional (no está originado en una enfermedad, puede mejorar sin tratamiento). °· Dolor de origen psicológico °· Depresión. °DIAGNÓSTICO °Su médico decidirá la gravedad del dolor a través del examen físico °· Análisis de sangre °· Radiografías °· Ecografías °· TC (tomografía computada, tipo  especial de radiografías). °· IMR (resonancia magnética) °· Cultivos, en el caso una infección °· Colon por enema de bario (se inserta una sustancia de contraste en el intestino grueso para mejorar la observación con rayos X.) °· Colonoscopía (observación del intestino con un tubo luminoso). °· Laparoscopía (examen del interior del abdomen con un tubo que tiene una luz). °· Cirugía exploratoria abdominal mayor (se observa el abdomen realizando una gran incisión). °TRATAMIENTO °El tratamiento dependerá de la causa del problema.  °· Muchos de estos casos pueden controlarse y tratarse en casa. °· Medicamentos de venta libre indicados por el médico. °· Medicamentos con receta. °· Antibióticos, en caso de infección °· Píldoras anticonceptivas, en el caso de períodos dolorosos o dolor al ovular. °· Tratamiento hormonal, para la endometriosis °· Inyecciones para bloqueo nervioso selectivo. °· Fisioterapia. °· Antidepresivos. °· Consejos por parte de un psícólogo o psiquiatra. °· Cirugía mayor o menor. °INSTRUCCIONES PARA EL CUIDADO DOMICILIARIO °· No tome ni administre laxantes a menos que se lo haya indicado su médico. °· Tome analgésicos de venta libre sólo si se lo ha indicado el profesional que lo asiste. No tome aspirina, ya que puede causar molestias en el estómago o hemorragias. °· Consuma una dieta líquida (caldo o agua) según lo indicado por el médico. Progrese lentamente a una dieta blanda, según la tolerancia, si el dolor se relaciona con el estómago o el intestino. °· Tenga un termómetro y tómese la temperatura varias veces al día. °· Haga reposo en la cama y duerma, si esto alivia el dolor. °· Evite las relaciones sexuales, si le producen dolor. °· Evite las situaciones estresantes. °· Cumpla con las visitas y los análisis de control, según las indicaciones de su médico. °· Si el dolor   no se alivia con los medicamentos o la cirugía, puede tratar con: °¨ Acupuntura. °¨ Ejercicios de relajación (yoga,  meditación). °¨ Terapia grupal. °¨ Psicoterapia. °SOLICITE ATENCIÓN MÉDICA SI: °· Nota que ciertos alimentos le producen dolor de estómago. °· El tratamiento indicado para realizar en el hogar no le alivia el dolor. °· Necesita analgésicos más fuertes. °· Quiere que le retiren el DIU. °· Si se siente confundido o desfalleciente. °· Presenta náuseas o vómitos. °· Aparece una erupción cutánea. °· Sufre efectos adversos o una reacción alérgica debido a los medicamentos que toma. °SOLICITE ATENCIÓN MÉDICA DE INMEDIATO SI: °· El dolor persiste o se agrava. °· Tiene fiebre. °· Siente el dolor sólo en algunos sectores del abdomen. Si se localiza en la zona derecha, posiblemente podría tratarse de apendicitis. En un adulto, si se localiza en la región inferior izquierda del abdomen, podría tratarse de colitis o diverticulitis. °· Hay sangre en las heces (deposiciones de color rojo brillante o negro alquitranado), con o sin vómitos. °· Usted presenta sangre en la orina. °· Siente escalofríos con o sin fiebre. °· Se desmaya. °ASEGÚRESE QUE:  °· Comprende estas instrucciones. °· Controlará su enfermedad. °· Solicitará ayuda de inmediato si no mejora o si empeora. °Document Released: 06/16/2008 Document Revised: 05/23/2011 °ExitCare® Patient Information ©2015 [employer]. This information is not intended to replace advice given to you by your health care provider. Make sure you discuss any questions you have with your health care provider. ° °

## 2013-12-12 NOTE — Discharge Planning (Signed)
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 Follow up appointment made for Friday Oct 2,2015 at 3:45, pt verbalized understanding of this appointment. My contact information given for any future questions or concerns. No other needs identified at this time.

## 2013-12-12 NOTE — ED Notes (Signed)
Patient transported to X-ray 

## 2013-12-12 NOTE — ED Notes (Signed)
Pt states she is been having CP 9/10 on her mid chest going down to epigastric area, pt having n/v, states had 5 episode of vomiting since 5 am today, having some bloody secretion. Pt when up to restroom and got dizzy and fail back, no sure if she has LOC or hit head. Pt having back pain and HA too. Family member at the bedside, NAD noticed.
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
## 2013-12-13 ENCOUNTER — Ambulatory Visit: Payer: Self-pay | Admitting: Family Medicine
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
## 2014-01-13 ENCOUNTER — Encounter (HOSPITAL_COMMUNITY): Payer: Self-pay | Admitting: Emergency Medicine

## 2014-01-30 ENCOUNTER — Ambulatory Visit (INDEPENDENT_AMBULATORY_CARE_PROVIDER_SITE_OTHER): Payer: Self-pay | Admitting: Family Medicine

## 2014-01-30 ENCOUNTER — Encounter: Payer: Self-pay | Admitting: Family Medicine

## 2014-01-30 ENCOUNTER — Ambulatory Visit (INDEPENDENT_AMBULATORY_CARE_PROVIDER_SITE_OTHER): Payer: No Typology Code available for payment source | Admitting: *Deleted

## 2014-01-30 VITALS — BP 130/84 | HR 75 | Temp 98.2°F | Ht 63.0 in | Wt 206.9 lb

## 2014-01-30 DIAGNOSIS — Z23 Encounter for immunization: Secondary | ICD-10-CM

## 2014-01-30 DIAGNOSIS — E118 Type 2 diabetes mellitus with unspecified complications: Secondary | ICD-10-CM

## 2014-01-30 DIAGNOSIS — K219 Gastro-esophageal reflux disease without esophagitis: Secondary | ICD-10-CM

## 2014-01-30 LAB — POCT GLYCOSYLATED HEMOGLOBIN (HGB A1C): Hemoglobin A1C: 9.1

## 2014-01-30 MED ORDER — OMEPRAZOLE 20 MG PO CPDR: 20.0000 mg | DELAYED_RELEASE_CAPSULE | Freq: Every day | ORAL | Status: DC

## 2014-01-30 NOTE — Progress Notes (Signed)
  Patient name: [name] MRN [id number]  Date of birth: Aug 11, 1975  CC & HPI:  [name] is a 38 y.o. female presenting today with multiple complaints: HA, heartburn, nasal congestion/difficulty breathing and f/u for DM. She opted to discuss her DM and difficulty breathing today and follow-up with her PCP for other issues.   DM2 - She reports being out of her meds for several weeks prior to her last A1c (13) but reports compliance with with Metformin 1000mg  BID and 70/30 10u qam and 6u qpm for the past several weeks. She denies financial difficulties at this time.  - She only eats ~ 1 meal a day; but continues to take 70/30 BID - not with meals; take pm dose prior to bed - She admits to feeling shaky and bad after taking her insulin; did not check BGs because she thought that "her body just wasn't used to insulin"  - She denies feeling hungry in the morning or afternoon - Denies blurry vision; neuropathy or CP  Difficulty breathing - She reports: burning sensation in her throat; metallic sensation in her mouth and constant nasal congestion for several weeks. She reports the nasal congestions makes it difficult for her to breath some times. She denies any CP, SOB or LE swelling. She does report being told she "had an stomach ulcer" in the hospital, and previous office visit for rectal blood and + FOBT. She has been referred to GI but unable to see them yet. She denies any epigastric pain, dizziness or melena.   ROS: See HPI   Medications & Allergies: Reviewed   Objective Findings:  Vitals: BP 130/84 mmHg  Pulse 75  Temp(Src) 98.2 F (36.8 C) (Oral)  Ht 5\' 3"  (1.6 m)  Wt 206 lb 14.4 oz (93.849 kg)  BMI 36.66 kg/m2  LMP 01/10/2014 (Approximate)  Gen: NAD; Obese HEENT: Swollen nasal turbinates; mild pharyngeal erythema  CV: RRR w/o m/r/g, pulses +2 b/l Resp: CTAB w/ normal respiratory effort LE: No swelling  Assessment & Plan:   Please See Problem Focused  Assessment & Plan

## 2014-01-30 NOTE — Patient Instructions (Signed)
Fue genial ver a ustedes hoy.   Slo tome su insulina cuando usted va a comer. Mejor cuando se toma con desayuno y [place] . Tomar 10 unidades con el desayuno y 8 unidades con la cena.  Revise su azcar en la sangre cada maana cuando te despiertas y si usted siente que su azcar en la sangre es baja (menos de 70 )  Anote todo lo que come y los tiempos y llevarlos a su prxima cita con el Dr. Josephina Gip o Me (Dr. Berkley Harvey ) si no est abierto [employer] .    Por favor traiga todos sus medicamentos para visitar cada mdicos  Next Appointment  Please make an appointment with Dr Josephina Gip or Berkley Harvey in 2 weeks    Si usted tiene alguna pregunta o inquietud antes de esa fecha , por favor llame a la clnica al [phone] .  Cuidar,  Dr. Phill Myron  La diabetes mellitus y los alimentos (Diabetes Mellitus and Food) Es importante que controle su nivel de azcar en la sangre (glucosa). El nivel de glucosa en sangre depende en gran medida de lo que usted come. Comer alimentos saludables en las cantidades [place] a lo largo del Training and development officer, aproximadamente a la misma hora [employer], lo ayudar a Chief Technology Officer su nivel de Multimedia programmer. Tambin puede ayudarlo a retrasar o Patent attorney de la diabetes mellitus. Comer de [employer] saludable incluso puede ayudarlo a Chartered loss adjuster de presin arterial y a Science writer o Theatre manager un peso saludable.  CMO PUEDEN AFECTARME LOS ALIMENTOS? Carbohidratos Los carbohidratos afectan el nivel de glucosa en sangre ms que cualquier otro tipo de alimento. El nutricionista lo ayudar a Teacher, adult education cuntos carbohidratos puede consumir en cada comida y ensearle a contarlos. El recuento de carbohidratos es importante para mantener la glucosa en sangre en un nivel saludable, en especial si utiliza insulina o toma determinados medicamentos para la diabetes mellitus. Alcohol El alcohol puede provocar disminuciones sbitas de la glucosa en sangre  (hipoglucemia), en especial si utiliza insulina o toma determinados medicamentos para la diabetes mellitus. La hipoglucemia es una afeccin que puede poner en peligro la vida. Los sntomas de la hipoglucemia (somnolencia, mareos y Data processing manager) son similares a los sntomas de haber consumido mucho alcohol.  Si el mdico lo autoriza a beber alcohol, hgalo con moderacin y siga estas pautas:  Las mujeres no deben beber ms de un trago por da, y los hombres no deben beber ms de dos tragos por Training and development officer. Un trago es igual a:  12 onzas (355 ml) de cerveza  5 onzas de vino (150 ml) de vino  1,5onzas (21ml) de bebidas espirituosas  No beba con el estmago vaco.  Mantngase hidratado. Beba agua, gaseosas dietticas o t helado sin azcar.  Las gaseosas comunes, los jugos y otros refrescos podran contener muchos carbohidratos y se Civil Service fast streamer. QU ALIMENTOS NO SE RECOMIENDAN? Cuando haga las elecciones de alimentos, es importante que recuerde que todos los alimentos son distintos. Algunos tienen menos nutrientes que otros por porcin, aunque podran tener la misma cantidad de caloras o carbohidratos. Es difcil darle al cuerpo lo que necesita cuando consume alimentos con menos nutrientes. Estos son algunos ejemplos de alimentos que debera evitar ya que contienen muchas caloras y carbohidratos, pero pocos nutrientes:  Physicist, medical trans (la mayora de los alimentos procesados incluyen grasas trans en la etiqueta de Informacin nutricional).  Gaseosas comunes.  Jugos.  Caramelos.  Dulces, como tortas, pasteles, rosquillas y [place].  Comidas fritas. QU ALIMENTOS PUEDO COMER? Consuma alimentos ricos en nutrientes, que nutrirn el cuerpo y lo mantendrn saludable. Los alimentos que debe comer tambin dependern de varios factores, como:  Las caloras que necesita.  Los medicamentos que toma.  Su peso.  El nivel de glucosa en [place].  El [place] de presin arterial.  El nivel de  colesterol. Tambin debe consumir una variedad de [place], como:  Protenas, como carne, aves, pescado, tofu, frutos secos y semillas (las protenas de [place] magros son mejores).  Lambert Mody.  Verduras.  Productos lcteos, como [place], queso y yogur (descremados son mejores).  Panes, granos, pastas, cereales, arroz y frijoles.  Grasas, como aceite de [place], [place] sin grasas trans, aceite de canola, aguacate y [place]. TODOS LOS QUE PADECEN DIABETES MELLITUS TIENEN EL [place] PLAN DE [place]? Dado que todas las personas que padecen diabetes mellitus son distintas, no hay un solo plan de comidas que funcione para todos. Es muy importante que se rena con un nutricionista que lo ayudar a crear un plan de comidas adecuado para usted. Document Released: 06/07/2007 Document Revised: 03/05/2013 [facility] Patient Information 2015 [place]. This information is not intended to replace advice given to you by your health care provider. Make sure you discuss any questions you have with your health care provider.

## 2014-01-30 NOTE — Assessment & Plan Note (Signed)
A1c improved today 9 from 13 - Taking 70/30 without meals - Advised to only take with meals: 10u breakfast, 8u w/ dinner - Only eating once a day; advised three meals a day; esp if taking 70/30 - Has poor understanding of DM and nutrition - advised f/u visit in 2 weeks to discuss nutrition. Asked her to keep food diary until then and check blood sugar qam and symptoms of hypoglycemia

## 2014-01-30 NOTE — Assessment & Plan Note (Addendum)
Reflux likely causing heart burn and chronic nasal congestion. Also w/ hx of + FOBT - Start PPI - already referred to GI - will discuss effect of food at next visit as has gallstones on CT

## 2014-01-31 ENCOUNTER — Telehealth: Payer: Self-pay | Admitting: *Deleted

## 2014-01-31 NOTE — Telephone Encounter (Signed)
Patient should be advised to stop the medication. She should also be advised to be seen in clinic to be evaluated for her vomiting and diarrhea.

## 2014-01-31 NOTE — Telephone Encounter (Signed)
Pt has been scheduled for Monday, February 03, 2014 at 3:45p. Pt made aware.  Hunt Oris, MSW, [place]

## 2014-01-31 NOTE — Telephone Encounter (Signed)
-----   Message from Hunt Oris, [place] sent at 01/31/2014  9:02 AM EST ----- Regarding: Bad reaction to medication The patient called me to cancel her appt because she says the medication she just started (omeprazole (PRILOSEC) 20 MG) capsule) is causing her to feel dizzy, vomit and have diarrhea. I told her I would forward this to her nurse/PCP to determine whether she needs to stop the medication.  This medication was prescribed on 11/19.  Thank you,   Hunt Oris, MSW, LCSW [phone]

## 2014-02-03 ENCOUNTER — Ambulatory Visit (INDEPENDENT_AMBULATORY_CARE_PROVIDER_SITE_OTHER): Payer: No Typology Code available for payment source | Admitting: Family Medicine

## 2014-02-03 VITALS — BP 123/52 | HR 77 | Temp 98.3°F | Wt 204.9 lb

## 2014-02-03 DIAGNOSIS — K219 Gastro-esophageal reflux disease without esophagitis: Secondary | ICD-10-CM

## 2014-02-03 DIAGNOSIS — K8020 Calculus of gallbladder without cholecystitis without obstruction: Secondary | ICD-10-CM

## 2014-02-03 DIAGNOSIS — K8010 Calculus of gallbladder with chronic cholecystitis without obstruction: Secondary | ICD-10-CM

## 2014-02-03 DIAGNOSIS — R1084 Generalized abdominal pain: Secondary | ICD-10-CM

## 2014-02-03 DIAGNOSIS — K921 Melena: Secondary | ICD-10-CM

## 2014-02-03 LAB — COMPREHENSIVE METABOLIC PANEL
ALT: 24 U/L (ref 0–35)
AST: 27 U/L (ref 0–37)
Albumin: 4.2 g/dL (ref 3.5–5.2)
Alkaline Phosphatase: 100 U/L (ref 39–117)
BUN: 10 mg/dL (ref 6–23)
CHLORIDE: 103 meq/L (ref 96–112)
CO2: 27 mEq/L (ref 19–32)
Calcium: 9.8 mg/dL (ref 8.4–10.5)
Creat: 0.45 mg/dL — ABNORMAL LOW (ref 0.50–1.10)
Glucose, Bld: 166 mg/dL — ABNORMAL HIGH (ref 70–99)
Potassium: 4.3 mEq/L (ref 3.5–5.3)
Sodium: 139 mEq/L (ref 135–145)
TOTAL PROTEIN: 7.4 g/dL (ref 6.0–8.3)
Total Bilirubin: 0.5 mg/dL (ref 0.2–1.2)

## 2014-02-03 LAB — CBC
HCT: 41.8 % (ref 36.0–46.0)
Hemoglobin: 13.8 g/dL (ref 12.0–15.0)
MCH: 27.3 pg (ref 26.0–34.0)
MCHC: 33 g/dL (ref 30.0–36.0)
MCV: 82.6 fL (ref 78.0–100.0)
MPV: 9.5 fL (ref 9.4–12.4)
Platelets: 261 10*3/uL (ref 150–400)
RBC: 5.06 MIL/uL (ref 3.87–5.11)
RDW: 13.3 % (ref 11.5–15.5)
WBC: 8.3 10*3/uL (ref 4.0–10.5)

## 2014-02-03 LAB — LIPASE: LIPASE: 34 U/L (ref 0–75)

## 2014-02-03 LAB — AMYLASE: Amylase: 35 U/L (ref 0–105)

## 2014-02-03 MED ORDER — RANITIDINE HCL 150 MG PO TABS: 150.0000 mg | ORAL_TABLET | Freq: Two times a day (BID) | ORAL | Status: DC

## 2014-02-03 MED ORDER — ONDANSETRON HCL 4 MG PO TABS: 4.0000 mg | ORAL_TABLET | Freq: Three times a day (TID) | ORAL | Status: DC | PRN

## 2014-02-03 NOTE — Patient Instructions (Signed)
I believe you have inflammation of one or more organs in your abdomen. We are doing blood tests to try to find out which. It is important for you to be able to at least drink, and preferably eat as well so I wrote for zofran to help with nausea and vomiting.   I also am giving you a prescription for zantac for your ulcer. You should follow up with the gastroenterologist.   If you experience acute worsening of this pain, inability to eat or drink for prolonged time, or light headedness and dizziness to the point that you nearly faint you should go to the emergency room.   I will call you with the results of your tests.

## 2014-02-03 NOTE — Assessment & Plan Note (Signed)
Multiple calcified gallstones without evidence of cholecystitis or biliary dilatation on abd CT 12/12/2013

## 2014-02-03 NOTE — Progress Notes (Signed)
Patient ID: [name], female   DOB: April 04, 1975, 38 y.o.   MRN: [id number]   Subjective:  [name] is a 38 y.o. female presenting to same day clinic for  ABDOMINAL PAIN  Pain began 3 days ago, attributes this to starting PPI, though vaguely complaining of symptoms starting well before that. Pain is poorly localized somewhat in the LUQ with bloating (has a history of PUD, hence PPI) as well as RUQ with eating and epigastrium constantly.   Medications tried: none Similar pain before: yes Prior abdominal surgeries: no  Symptoms Nausea/vomiting: yes, been able to keep fluid but not solids down. Diarrhea: yes, but improving. One somewhat formed stool today.  Constipation: no Blood in stool: no Blood in vomit: no Fever: no Dysuria: no Loss of appetite: yes Weight loss: no  Vaginal Bleeding: no Missed menstrual period: no  - Review of Systems: Per HPI.  - Smoking status noted Objective:  BP 123/52 mmHg  Pulse 77  Temp(Src) 98.3 F (36.8 C) (Oral)  Wt 204 lb 14.4 oz (92.942 kg)  LMP 01/10/2014 (Approximate) Gen: Well-appearing 38 y.o.female in NAD HEENT: MMM, posterior oropharynx clear, fair dentition, no scleral icterus Neck: neck supple, no masses appreciated; thyroid not enlarged  Pulm: Non-labored; CTAB, no wheezes  CV: Regular rate, no murmur appreciated; distal pulses intact/symmetric; no LE edema GI: obese without surgical scars, hypoactive BS; soft, diffusely tender to palpation x 4 quadrants without rebound, non-distended, no hernia Skin: No rashes, wounds, ulcers or jaundice  Assessment/Plan:  [name] is a 38 y.o. female here for myriad abdominal complaints concerning for multiple etiologies.   Problem List Items Addressed This Visit      Digestive   GERD - Primary   Relevant Medications      ondansetron (ZOFRAN) tablet      ranitidine (ZANTAC) tablet   Other Relevant Orders      Ambulatory referral to Gastroenterology    CBC (Completed)      Comprehensive metabolic panel (Completed)   Cholelithiasis    Multiple calcified gallstones without evidence of cholecystitis or biliary dilatation on abd CT 12/12/2013    Relevant Orders      Comprehensive metabolic panel (Completed)     Other   Abdominal pain, generalized    Character of pain is nonspecific. Suspect pancreatitis (epigastric pain with steatorrhea and N/V), cholecystitis (known cholelithiasis), gastritis. No concern for acute abdomen and pt is well appearing with signs of euvolemia despite report of emesis and diarrhea.  - Check for cholestasis, renal function and lytes - Amylase/lipase: if amylase is normal will err on the side of limiting further work up - CBC to ensure anemia is not causing fatigue and evaluate evidence of catharsis from bleeding ulcer - Hold off on imaging for now - Re-referred to GI for history of ulcer and h/o FOBT+ as well as cholelithiasis which may now be symptomatic    Relevant Orders      Lipase (Completed)      Amylase (Completed)   Blood in stool   Relevant Orders      Ambulatory referral to Gastroenterology      CBC (Completed)    Other Visit Diagnoses    Cholelithiasis with cholecystitis of other acuity        Relevant Orders       Comprehensive metabolic panel (Completed)

## 2014-02-03 NOTE — Progress Notes (Signed)
[name] from [facility] interpreted for patient because of language barrier.[name]

## 2014-02-04 ENCOUNTER — Encounter: Payer: Self-pay | Admitting: Gastroenterology

## 2014-02-04 ENCOUNTER — Encounter: Payer: Self-pay | Admitting: Family Medicine

## 2014-02-04 NOTE — Assessment & Plan Note (Addendum)
Character of pain is nonspecific. Suspect pancreatitis (epigastric pain with steatorrhea and N/V), cholecystitis (known cholelithiasis), gastritis. No concern for acute abdomen and pt is well appearing with signs of euvolemia despite report of emesis and diarrhea.  - Check for cholestasis, renal function and lytes - Amylase/lipase: if amylase is normal will err on the side of limiting further work up - CBC to ensure anemia is not causing fatigue and evaluate evidence of catharsis from bleeding ulcer - Hold off on imaging for now - Re-referred to GI for history of ulcer and h/o FOBT+ as well as cholelithiasis which may now be symptomatic

## 2014-02-18 ENCOUNTER — Encounter (HOSPITAL_COMMUNITY): Payer: Self-pay | Admitting: *Deleted

## 2014-02-18 ENCOUNTER — Emergency Department (HOSPITAL_COMMUNITY)
Admission: EM | Admit: 2014-02-18 | Discharge: 2014-02-18 | Disposition: A | Discharge status: Home / Self Care | Payer: No Typology Code available for payment source | Attending: Emergency Medicine | Admitting: Emergency Medicine

## 2014-02-18 DIAGNOSIS — F419 Anxiety disorder, unspecified: Secondary | ICD-10-CM | POA: Insufficient documentation

## 2014-02-18 DIAGNOSIS — I10 Essential (primary) hypertension: Secondary | ICD-10-CM | POA: Insufficient documentation

## 2014-02-18 DIAGNOSIS — Z79899 Other long term (current) drug therapy: Secondary | ICD-10-CM | POA: Insufficient documentation

## 2014-02-18 DIAGNOSIS — K219 Gastro-esophageal reflux disease without esophagitis: Secondary | ICD-10-CM | POA: Insufficient documentation

## 2014-02-18 DIAGNOSIS — N390 Urinary tract infection, site not specified: Secondary | ICD-10-CM | POA: Insufficient documentation

## 2014-02-18 DIAGNOSIS — Z3202 Encounter for pregnancy test, result negative: Secondary | ICD-10-CM | POA: Insufficient documentation

## 2014-02-18 DIAGNOSIS — F329 Major depressive disorder, single episode, unspecified: Secondary | ICD-10-CM | POA: Insufficient documentation

## 2014-02-18 DIAGNOSIS — E119 Type 2 diabetes mellitus without complications: Secondary | ICD-10-CM | POA: Insufficient documentation

## 2014-02-18 DIAGNOSIS — Z794 Long term (current) use of insulin: Secondary | ICD-10-CM | POA: Insufficient documentation

## 2014-02-18 DIAGNOSIS — N72 Inflammatory disease of cervix uteri: Secondary | ICD-10-CM | POA: Insufficient documentation

## 2014-02-18 LAB — WET PREP, GENITAL
Clue Cells Wet Prep HPF POC: NONE SEEN
Trich, Wet Prep: NONE SEEN
YEAST WET PREP: NONE SEEN

## 2014-02-18 LAB — URINALYSIS, ROUTINE W REFLEX MICROSCOPIC
Bilirubin Urine: NEGATIVE
Glucose, UA: 100 mg/dL — AB
Ketones, ur: 15 mg/dL — AB
Nitrite: NEGATIVE
PROTEIN: 100 mg/dL — AB
Specific Gravity, Urine: 1.031 — ABNORMAL HIGH (ref 1.005–1.030)
UROBILINOGEN UA: 0.2 mg/dL (ref 0.0–1.0)
pH: 5.5 (ref 5.0–8.0)

## 2014-02-18 LAB — URINE MICROSCOPIC-ADD ON

## 2014-02-18 LAB — PREGNANCY, URINE: Preg Test, Ur: NEGATIVE

## 2014-02-18 MED ORDER — PHENAZOPYRIDINE HCL 200 MG PO TABS: 200.0000 mg | ORAL_TABLET | Freq: Three times a day (TID) | ORAL | Status: DC | PRN

## 2014-02-18 MED ORDER — PHENAZOPYRIDINE HCL 100 MG PO TABS
200.0000 mg | ORAL_TABLET | Freq: Three times a day (TID) | ORAL | Status: DC
  Administered 2014-02-18: 200 mg via ORAL
  Filled 2014-02-18: qty 2

## 2014-02-18 MED ORDER — LIDOCAINE HCL (PF) 1 % IJ SOLN
INTRAMUSCULAR | Status: AC
  Filled 2014-02-18: qty 5

## 2014-02-18 MED ORDER — AZITHROMYCIN 1 G PO PACK
1.0000 g | PACK | Freq: Once | ORAL | Status: AC
  Administered 2014-02-18: 1 g via ORAL
  Filled 2014-02-18: qty 1

## 2014-02-18 MED ORDER — HYDROCODONE-ACETAMINOPHEN 5-325 MG PO TABS
2.0000 | ORAL_TABLET | Freq: Once | ORAL | Status: AC
Start: 2014-02-18 — End: 2014-02-18
  Administered 2014-02-18: 2 via ORAL
  Filled 2014-02-18: qty 2

## 2014-02-18 MED ORDER — KETOROLAC TROMETHAMINE 60 MG/2ML IM SOLN
60.0000 mg | Freq: Once | INTRAMUSCULAR | Status: AC
  Administered 2014-02-18: 60 mg via INTRAMUSCULAR
  Filled 2014-02-18: qty 2

## 2014-02-18 MED ORDER — LIDOCAINE HCL (PF) 1 % IJ SOLN
2.0000 mL | Freq: Once | INTRAMUSCULAR | Status: AC
  Administered 2014-02-18: 2 mL

## 2014-02-18 MED ORDER — ONDANSETRON 4 MG PO TBDP
4.0000 mg | ORAL_TABLET | Freq: Once | ORAL | Status: AC
  Administered 2014-02-18: 4 mg via ORAL
  Filled 2014-02-18: qty 1

## 2014-02-18 MED ORDER — CEPHALEXIN 500 MG PO CAPS: 1000.0000 mg | ORAL_CAPSULE | Freq: Two times a day (BID) | ORAL | Status: DC

## 2014-02-18 MED ORDER — HYDROCODONE-ACETAMINOPHEN 5-325 MG PO TABS: 1.0000 | ORAL_TABLET | ORAL | Status: DC | PRN

## 2014-02-18 MED ORDER — CEFTRIAXONE SODIUM 250 MG IJ SOLR
250.0000 mg | Freq: Once | INTRAMUSCULAR | Status: AC
  Administered 2014-02-18: 250 mg via INTRAMUSCULAR
  Filled 2014-02-18: qty 250

## 2014-02-18 MED ORDER — METRONIDAZOLE 500 MG PO TABS
2000.0000 mg | ORAL_TABLET | Freq: Once | ORAL | Status: AC
  Administered 2014-02-18: 2000 mg via ORAL
  Filled 2014-02-18: qty 4

## 2014-02-18 NOTE — ED Notes (Signed)
Discussed d/c instructions with pt and interpreter.

## 2014-02-18 NOTE — Discharge Instructions (Signed)
Cervicitis (Cervicitis) Es el dolor e hinchazn (inflamacin) del cuello uterino. El cuello del tero se encuentra en la parte inferior del tero. Se abre hacia la vagina. [place] transmisin sexual (ETS).   Reacciones alrgicas.   Medicamentos o dispositivos anticonceptivos que se Water engineer.   Traumatismo en el cuello del tero.   Infecciones bacterianas.  FACTORES DE RIESGO Usted tendr mayor riesgo de sufrir esta enfermedad si:  Tiene relaciones sexuales sin proteccin.  Ha tenido relaciones sexuales con varias parejas.  Comienza a Clinical biochemist a una edad temprana.  Tiene una historia de ETS. SNTOMAS  Puede ser que no haya sntomas. Si hay sntomas, pueden ser:   Secrecin vaginal de color gris, blanco, amarillo o que tiene mal olor.   Picazn o dolor en la zona externa de la vagina.   Relaciones sexuales dolorosas.   Dolor en la zona baja del abdomen o la espalda, [place].   Ganas de orinar con frecuencia.   Sangrado vaginal anormal entre perodos, despus de las relaciones sexuales o despus de la menopausia.   Sensacin de presin o pesadez en la pelvis.  DIAGNSTICO  El diagnstico se realiza mediante un examen plvico. Otras pruebas son:   Examen de las secreciones en el microscopio (preparado fresco).   Papanicolau  TRATAMIENTO  El tratamiento depender de la causa de la cervicitis. Si la causa es una enfermedad de transmisin sexual, usted y su pareja necesitarn Arts administrator. Le prescribirn antibiticos.  INSTRUCCIONES PARA EL CUIDADO EN EL HOGAR   No tenga relaciones sexuales hasta que el mdico la autorice.   No tenga relaciones sexuales hasta que su compaero haya sido tratado si la causa de la cervicitis es una enfermedad de transmisin sexual.   Tome los antibiticos como se le indic. Tmelos todos, aunque se sienta mejor.  SOLICITE  ATENCIN MDICA SI:  Los sntomas vuelven a Arts administrator.   Tiene fiebre.  ASEGRESE DE QUE:   Comprende estas instrucciones.  Controlar su afeccin.  Recibir ayuda de inmediato si no mejora o si empeora. Document Released: 02/28/2005 Document Revised: 10/31/2012 [facility] Patient Information 2015 [place]. This information is not intended to replace advice given to you by your health care provider. Make sure you discuss any questions you have with your health care provider. Infeccin urinaria  (Urinary Tract Infection)  La infeccin urinaria puede ocurrir en Clinical cytogeneticist del tracto urinario. El tracto urinario es un sistema de drenaje del cuerpo por el que se eliminan los desechos y el exceso de [place]. El tracto urinario est formado por dos riones, dos urteres, la vejiga y Geologist, engineering. Los riones son rganos que tienen forma de frijol. Cada rin tiene aproximadamente el tamao del puo. Estn situados debajo de las [place], uno a cada lado de la columna vertebral CAUSAS  La causa de la infeccin son los microbios, que son organismos microscpicos, que incluyen hongos, virus, y bacterias. Estos organismos son tan pequeos que slo pueden verse a travs del microscopio. Las bacterias son los microorganismos que ms comnmente causan infecciones urinarias.  SNTOMAS  Los sntomas pueden variar segn la edad y el sexo del paciente y por la ubicacin de la infeccin. Los sntomas en las mujeres jvenes incluyen la necesidad frecuente e intensa de orinar y una sensacin dolorosa de ardor en la vejiga o en la uretra durante la miccin. Las mujeres y los hombres mayores podrn sentir cansancio, temblores y debilidad y Arts development officer  musculares y dolor abdominal. Si tiene fiebre, puede significar que la infeccin est en los riones. Otros sntomas son dolor en la espalda o en los lados debajo de las [place], nuseas y vmitos.  DIAGNSTICO  Para diagnosticar una infeccin urinaria, el  mdico le preguntar acerca de sus sntomas. [employer] una [place] de [place]. La muestra de orina se analiza para Hydrographic surveyor bacterias y glbulos blancos de Herbalist. Los glbulos blancos se forman en el organismo para ayudar a Radio broadcast assistant las infecciones.  TRATAMIENTO  Por lo general, las infecciones urinarias pueden tratarse con medicamentos. Debido a que la [employer] de las infecciones son causadas por bacterias, por lo general pueden tratarse con antibiticos. La eleccin del antibitico y la duracin del tratamiento depender de sus sntomas y el tipo de bacteria causante de la infeccin.  INSTRUCCIONES PARA EL CUIDADO EN EL HOGAR   Si le recetaron antibiticos, tmelos exactamente como su mdico le indique. Termine el medicamento aunque se sienta mejor despus de haber tomado slo algunos.  Beba gran cantidad de lquido para mantener la orina de tono claro o color amarillo plido.  Evite la cafena, el t y las bebidas gaseosas. Estas sustancias irritan la vejiga.  Vaciar la vejiga con frecuencia. Evite retener la orina durante largos perodos.  Vace la vejiga antes y despus de Clinical biochemist.  Despus de mover el intestino, las mujeres deben higienizarse la regin perineal desde adelante hacia atrs. Use slo un papel tissue por vez. SOLICITE ATENCIN MDICA SI:   Siente dolor en la espalda.  Le sube la fiebre.  Los sntomas no mejoran luego de 3 das. SOLICITE ATENCIN MDICA DE INMEDIATO SI:   Siente dolor intenso en la espalda o en la zona inferior del abdomen.  Comienza a sentir escalofros.  Tiene nuseas o vmitos.  Tiene una sensacin continua de quemazn o molestias al [employer]. ASEGRESE DE QUE:   Comprende estas instrucciones.  Controlar su enfermedad.  Solicitar ayuda de inmediato si no mejora o empeora. Document Released: 12/08/2004 Document Revised: 11/23/2011 [facility] Patient Information 2015 [place]. This information is not  intended to replace advice given to you by your health care provider. Make sure you discuss any questions you have with your health care provider.

## 2014-02-18 NOTE — ED Provider Notes (Signed)
CSN: [id number]     Arrival date & time 02/18/14  1042 History   First MD Initiated Contact with Patient 02/18/14 1049     Chief Complaint  Patient presents with  . Dysuria  . Hematuria  . Abdominal Pain     (Consider location/radiation/quality/duration/timing/severity/associated sxs/prior Treatment) HPI  The patient reports sudden onset of suprapubic pain and burning with urination. Reportedly it just occurred today when she was with her daughter at her daughter's doctor's appointment. She went to go to the bathroom and suddenly she had intense burning and urgency. She reports it has persisted since that time. She denies or any symptoms this morning or yesterday. Patient denies fever. She endorses minimal back or flank pain. Reportedly there was blood on the toilet paper when she went to the bathroom. She reports having finished her menstrual cycle 3 days ago. She reports that she was well without any symptoms of illness until this occurred.  Past Medical History  Diagnosis Date  . Hypertension   . Depression   . Diabetes mellitus   . GERD (gastroesophageal reflux disease)    Past Surgical History  Procedure Laterality Date  . Cesarean section    . Tubal ligation     Family History  Problem Relation Age of Onset  . Hypertension Mother   . Diabetes Father    History  Substance Use Topics  . Smoking status: Never Smoker   . Smokeless tobacco: Never Used  . Alcohol Use: No   OB History    Gravida Para Term Preterm AB TAB SAB Ectopic Multiple Living   6 3 3       3      Review of Systems  10 Systems reviewed and are negative for acute change except as noted in the HPI.   Allergies  Review of patient's allergies indicates no known allergies.  Home Medications   Prior to Admission medications   Medication Sig Start Date End Date Taking? Authorizing Provider  insulin NPH-regular Human (NOVOLIN 70/30) (70-30) 100 UNIT/ML injection Inject into the skin 2 (two) times  daily with a meal. 10u with breakfast and 8u with dinner   Yes Historical Provider, MD  metFORMIN (GLUCOPHAGE) 1000 MG tablet Take 1,000 mg by mouth 2 (two) times daily with a meal.   Yes Historical Provider, MD  ranitidine (ZANTAC) 150 MG tablet Take 1 tablet (150 mg total) by mouth 2 (two) times daily. 02/03/14  Yes Patrecia Pour, MD  cephALEXin (KEFLEX) 500 MG capsule Take 2 capsules (1,000 mg total) by mouth 2 (two) times daily. 02/18/14   Charlesetta Shanks, MD  HYDROcodone-acetaminophen (NORCO/VICODIN) 5-325 MG per tablet Take 1 tablet by mouth every 6 (six) hours as needed for moderate pain. Patient not taking: Reported on 02/18/2014 12/12/13   Carmin Muskrat, MD  HYDROcodone-acetaminophen (NORCO/VICODIN) 5-325 MG per tablet Take 1-2 tablets by mouth every 4 (four) hours as needed for moderate pain or severe pain. 02/18/14   Charlesetta Shanks, MD  LORazepam (ATIVAN) 0.5 MG tablet Take 1 tablet (0.5 mg total) by mouth daily as needed for anxiety. Patient not taking: Reported on 02/18/2014 12/12/13   Carmin Muskrat, MD  omeprazole (PRILOSEC) 20 MG capsule Take 1 capsule (20 mg total) by mouth daily. Patient not taking: Reported on 02/18/2014 01/30/14   Olam Idler, MD  ondansetron (ZOFRAN) 4 MG tablet Take 1 tablet (4 mg total) by mouth every 8 (eight) hours as needed for nausea or vomiting. Patient not taking: Reported on 02/18/2014 02/03/14  Patrecia Pour, MD  pantoprazole (PROTONIX) 40 MG tablet Take 1 tablet (40 mg total) by mouth daily. Patient not taking: Reported on 02/18/2014 12/13/13   Carmin Muskrat, MD  phenazopyridine (PYRIDIUM) 200 MG tablet Take 1 tablet (200 mg total) by mouth 3 (three) times daily as needed for pain. 02/18/14   Charlesetta Shanks, MD   BP 90/68 mmHg  Pulse 78  Temp(Src) 97.6 F (36.4 C) (Oral)  Resp 20  Ht 5' (1.524 m)  Wt 200 lb (90.719 kg)  BMI 39.06 kg/m2  SpO2 99%  LMP 02/13/2014 (Exact Date) Physical Exam  Constitutional: She is oriented to person, place, and  time. She appears well-developed and well-nourished.  The patient is moderately obese. She is alert and nontoxic. She appears to be in pain.  HENT:  Head: Normocephalic and atraumatic.  Eyes: EOM are normal. Pupils are equal, round, and reactive to light.  Neck: Neck supple.  Cardiovascular: Normal rate, regular rhythm, normal heart sounds and intact distal pulses.   Pulmonary/Chest: Effort normal and breath sounds normal.  Abdominal: Soft. Bowel sounds are normal. She exhibits no distension. There is tenderness (the patient endorses significant suprapubic tenderness. There is no guarding.).  Genitourinary:  Normal external female genitalia. Speculum examination small amount of blood from the cervical os no pooling. Patient has severe cervical motion tenderness. There is general tenderness to palpation of the uterus and bilateral adnexa without palpable mass or fullness.  Musculoskeletal: Normal range of motion. She exhibits no edema.  Neurological: She is alert and oriented to person, place, and time. She has normal strength. Coordination normal. GCS eye subscore is 4. GCS verbal subscore is 5. GCS motor subscore is 6.  Skin: Skin is warm, dry and intact.  Psychiatric:  The patient is anxious.    ED Course  Procedures (including critical care time) Labs Review Labs Reviewed  WET PREP, GENITAL - Abnormal; Notable for the following:    WBC, Wet Prep HPF POC FEW (*)    All other components within normal limits  URINALYSIS, ROUTINE W REFLEX MICROSCOPIC - Abnormal; Notable for the following:    Color, Urine AMBER (*)    APPearance TURBID (*)    Specific Gravity, Urine 1.031 (*)    Glucose, UA 100 (*)    Hgb urine dipstick LARGE (*)    Ketones, ur 15 (*)    Protein, ur 100 (*)    Leukocytes, UA LARGE (*)    All other components within normal limits  URINE MICROSCOPIC-ADD ON - Abnormal; Notable for the following:    Squamous Epithelial / LPF FEW (*)    Bacteria, UA FEW (*)    All  other components within normal limits  GC/CHLAMYDIA PROBE AMP  PREGNANCY, URINE    Imaging Review No results found.   EKG Interpretation None      MDM   Final diagnoses:  UTI (lower urinary tract infection)  Cervicitis   The patient is grossly positive UA. In addition to this however she has significant cervical motion tenderness consistent with cervicitis. At this point I felt is appropriate to empirically start treatment for cervicitis as well as for UTI. All symptoms just started shortly prior to arrival. Pregnancy is negative ruling out ectopic. Pain is not localizing to one side or the other and is very central in nature.    Charlesetta Shanks, MD 02/18/14 [phone]

## 2014-02-18 NOTE — ED Notes (Signed)
Pt c/o nausea, Dr. Johnney Killian aware. Pt states is comfortable with discharge s/p zofran administration. Pt understand d/c and f/u instructions.

## 2014-02-19 LAB — GC/CHLAMYDIA PROBE AMP
CT Probe RNA: NEGATIVE
GC Probe RNA: NEGATIVE

## 2014-02-20 ENCOUNTER — Encounter: Payer: Self-pay | Admitting: Family Medicine

## 2014-02-20 ENCOUNTER — Ambulatory Visit (INDEPENDENT_AMBULATORY_CARE_PROVIDER_SITE_OTHER): Payer: No Typology Code available for payment source | Admitting: Family Medicine

## 2014-02-20 VITALS — BP 137/60 | HR 68 | Temp 97.9°F | Resp 18

## 2014-02-20 DIAGNOSIS — R1084 Generalized abdominal pain: Secondary | ICD-10-CM

## 2014-02-20 LAB — POCT URINALYSIS DIPSTICK
BILIRUBIN UA: NEGATIVE
Glucose, UA: 500
Ketones, UA: NEGATIVE
Leukocytes, UA: NEGATIVE
Nitrite, UA: NEGATIVE
Protein, UA: 30
Spec Grav, UA: 1.025
UROBILINOGEN UA: 0.2
pH, UA: 5.5

## 2014-02-20 LAB — POCT UA - MICROSCOPIC ONLY

## 2014-02-20 NOTE — Patient Instructions (Signed)
It was great seeing you today.   1. I'm sorry you continue to have abdominal pain. Your abdominal CT image and blood work do not show any reason for you stomach pain other than your urinary tract infection. Continue taking your Keflex 500mg  three times a day.    Next Appointme  Please make an appointment with Dr Josephina Gip for vaginal bleeding   I look forward to talking with you again at our next visit. If you have any questions or concerns before then, please call the clinic at [phone].  Take Care,   Dr Phill Myron

## 2014-02-20 NOTE — Assessment & Plan Note (Signed)
Pain reported as chronic. ~ 7 years and unchanged. VSS. Recent lab work-up and CT ab negative except for gall stones - DDx: cholelithiasis vs IBS vs endometriosis vs abdominal adhesion due to previous surgeries  - Already referred to GI; apt in January - Continue Abx for UTI - Discontinue Hydrocodone - Rcheck UA, CBC - f/u with PCP for ongoing evaluation of likely vaginal bleeding vs hematuria if UA remains positive for hgb s/p UTI treatment

## 2014-02-20 NOTE — Addendum Note (Signed)
Addended by: [place], Shelvy Perazzo on: 02/20/2014 06:00 PM   Modules accepted: Orders

## 2014-02-20 NOTE — Progress Notes (Signed)
  Patient name: [name] MRN [id number]  Date of birth: 06-21-1975  CC & HPI:  [name] is a 38 y.o. female presenting today for f/u of ED visit. She was seen for abdominal pain and diagnosed with UTI. Treated with keflex and opioid pain medications. Complains of no improvement in pain. Has N/V occasionally after taking pain medication. Pain persist in lower abdomen that is chronic ~ 7 years. Reports pain is 8/10 today and nothing makes it better. Continue to report minimal blood with wiping. LMP 02/13/14; regular. Denies constipation; last BM this morning - soft. Denies CP, SOB. Continues to endorse dysuria. Denies fevers.   ROS: See HPI   Medical & Surgical Hx:  Reviewed - b/l tubal ligation s/p Csection Medications & Allergies: Reviewed   Objective Findings:  Vitals: BP 137/60 mmHg  Pulse 68  Temp(Src) 97.9 F (36.6 C) (Oral)  Resp 18  SpO2 99%  LMP 02/13/2014 (Exact Date)  Gen: NAD CV: RRR w/o m/r/g, pulses +2 b/l Resp: CTAB w/ normal respiratory effort GI: No skin changes; BS + x 4 quads; Mild diffuse tenderness; NO masses, No CVA tenderness. No HSM;   Assessment & Plan:   Please See Problem Focused Assessment & Plan

## 2014-02-21 LAB — CBC
HCT: 39.4 % (ref 36.0–46.0)
HEMOGLOBIN: 13 g/dL (ref 12.0–15.0)
MCH: 27 pg (ref 26.0–34.0)
MCHC: 33 g/dL (ref 30.0–36.0)
MCV: 81.9 fL (ref 78.0–100.0)
MPV: 9.8 fL (ref 9.4–12.4)
Platelets: 217 10*3/uL (ref 150–400)
RBC: 4.81 MIL/uL (ref 3.87–5.11)
RDW: 13.3 % (ref 11.5–15.5)
WBC: 6.6 10*3/uL (ref 4.0–10.5)

## 2014-03-17 ENCOUNTER — Ambulatory Visit: Payer: No Typology Code available for payment source | Admitting: Family Medicine

## 2014-03-19 ENCOUNTER — Encounter: Payer: Self-pay | Admitting: Nurse Practitioner

## 2014-03-19 ENCOUNTER — Other Ambulatory Visit: Payer: Self-pay

## 2014-03-19 ENCOUNTER — Ambulatory Visit (INDEPENDENT_AMBULATORY_CARE_PROVIDER_SITE_OTHER): Payer: Self-pay | Admitting: Nurse Practitioner

## 2014-03-19 VITALS — BP 139/97 | HR 65 | Temp 97.0°F | Ht 59.0 in | Wt 203.8 lb

## 2014-03-19 DIAGNOSIS — K219 Gastro-esophageal reflux disease without esophagitis: Secondary | ICD-10-CM

## 2014-03-19 DIAGNOSIS — K922 Gastrointestinal hemorrhage, unspecified: Secondary | ICD-10-CM

## 2014-03-19 DIAGNOSIS — K2971 Gastritis, unspecified, with bleeding: Secondary | ICD-10-CM

## 2014-03-19 DIAGNOSIS — R1013 Epigastric pain: Secondary | ICD-10-CM | POA: Insufficient documentation

## 2014-03-19 LAB — COMPREHENSIVE METABOLIC PANEL
ALT: 20 U/L (ref 0–35)
AST: 19 U/L (ref 0–37)
Albumin: 3.9 g/dL (ref 3.5–5.2)
Alkaline Phosphatase: 98 U/L (ref 39–117)
BUN: 10 mg/dL (ref 6–23)
CO2: 23 mEq/L (ref 19–32)
Calcium: 8.8 mg/dL (ref 8.4–10.5)
Chloride: 99 mEq/L (ref 96–112)
Creat: 0.32 mg/dL — ABNORMAL LOW (ref 0.50–1.10)
Glucose, Bld: 367 mg/dL — ABNORMAL HIGH (ref 70–99)
Potassium: 3.9 mEq/L (ref 3.5–5.3)
Sodium: 134 mEq/L — ABNORMAL LOW (ref 135–145)
Total Bilirubin: 0.6 mg/dL (ref 0.2–1.2)
Total Protein: 6.9 g/dL (ref 6.0–8.3)

## 2014-03-19 LAB — CBC WITH DIFFERENTIAL/PLATELET
BASOS ABS: 0.1 10*3/uL (ref 0.0–0.1)
Basophils Relative: 1 % (ref 0–1)
EOS PCT: 6 % — AB (ref 0–5)
Eosinophils Absolute: 0.4 10*3/uL (ref 0.0–0.7)
HEMATOCRIT: 42.1 % (ref 36.0–46.0)
Hemoglobin: 13.8 g/dL (ref 12.0–15.0)
LYMPHS ABS: 2.1 10*3/uL (ref 0.7–4.0)
Lymphocytes Relative: 32 % (ref 12–46)
MCH: 27 pg (ref 26.0–34.0)
MCHC: 32.8 g/dL (ref 30.0–36.0)
MCV: 82.4 fL (ref 78.0–100.0)
MONOS PCT: 5 % (ref 3–12)
MPV: 10.3 fL (ref 8.6–12.4)
Monocytes Absolute: 0.3 10*3/uL (ref 0.1–1.0)
Neutro Abs: 3.7 10*3/uL (ref 1.7–7.7)
Neutrophils Relative %: 56 % (ref 43–77)
Platelets: 197 10*3/uL (ref 150–400)
RBC: 5.11 MIL/uL (ref 3.87–5.11)
RDW: 13.1 % (ref 11.5–15.5)
WBC: 6.6 10*3/uL (ref 4.0–10.5)

## 2014-03-19 LAB — LIPASE: Lipase: 36 U/L (ref 0–75)

## 2014-03-19 MED ORDER — PEG-KCL-NACL-NASULF-NA ASC-C 100 G PO SOLR
1.0000 | ORAL | Status: DC
Start: 2014-03-19 — End: 2014-03-19

## 2014-03-19 MED ORDER — PEG-KCL-NACL-NASULF-NA ASC-C 100 G PO SOLR
1.0000 | ORAL | Status: DC
Start: 2014-03-19 — End: 2014-04-07

## 2014-03-19 NOTE — Assessment & Plan Note (Signed)
Patient presents for GERD symptoms including epigastric burning, sour taste, nausea, and foul mouth odor. Concerning symptoms of dark/black stool within the past few weeks. Takes Motrin occasionally for pain. On a PPI, med history states pantoprazole 40mg  qd but patient states she takes her acid medication twice a day, unable to recall the dose strength.  Proceed with EGD and colonoscopy with Dr. Oneida Alar in the near future. The risks, benefits, and alternatives have been discussed in detail with the patient. They state understanding and desire to proceed.

## 2014-03-19 NOTE — Patient Instructions (Signed)
1. We will schedule an endoscopy and colonoscopy with Dr. Oneida Alar. 2. Continue your current medications 3. Please have your labs done in the next few days 4. Further recommendations to follow based on the results of the procedure.

## 2014-03-19 NOTE — Assessment & Plan Note (Addendum)
Patient with reports of both dark tarry stools possibly melena as well as brbpr, no known history of hemorrhoids. Diffuse mild abdominal patin, much worse at epigastric area. Cannot rule out upper GI bleed, gastritis, or bleeding ulcer. Bright red blood likely benign anorectal source but cannot rule out diverticula, colon ca, or other insidious lower GI issue. CBC, CMP, and lipase ordered. Will refer for colonoscopy/EGD.  Proceed with EGD and colonoscopy with Dr. Oneida Alar in the near future. The risks, benefits, and alternatives have been discussed in detail with the patient. They state understanding and desire to proceed.

## 2014-03-19 NOTE — Assessment & Plan Note (Addendum)
Diffuse abdominal pain more intense at epigastric area. Concerning symptoms also include possible melena and/or hematochezia. History of GERD, patient unable to recall dose of current bid PPI therapy. Cannot exclude gastritis, bleeding ulcer. Possible lower GI componant likely benign anorectal source but cannot exclude diverticula, polyps, or other incidious lower GI process. CBC, CMP, lipase ordered.  Proceed with EGD and colonoscopy with Dr. Oneida Alar in the near future. The risks, benefits, and alternatives have been discussed in detail with the patient. They state understanding and desire to proceed.

## 2014-03-19 NOTE — Progress Notes (Signed)
Primary Care Physician:  [name], MD Primary Gastroenterologist:  Dr. Oneida Alar  Chief Complaint  Patient presents with  . Blood In Stools  . Gastrophageal Reflux    HPI:   39 year old female presents for evaluation of GERD symptoms and blood in stool. Patient is non-English speaking but translating with a Optometrist. Complains of stomach pain chronically for the past 3 years. Pain is primarily epigastric in nature and radiates across the abdomen. Is currently on Prilosec bid but patient is unsure if it is 20mg  or 40mg . This was started a couple months ago and has helped "a little." Pain is described as "burning." Admits nausea but no vomiting. Pain is constant with some relief associated with eating. Also admits a bitter taste and an odor of "sulfur." Has a bowel movement approximately 2-3 times a day. Stool consistency varies all over the [facility] Stool scale from 1 to 7. States she's also having blood in her stool which she describes as dark red near black which occurred 3 times last week. Also occasionally has hematochezia and no known history of hemorrhoids. Takes Motrin every 6 hours when she's having pain which typically happens 2-3 times a week. Denies ASA powders. Denies any other upper or lower GI symptoms.  Past Medical History  Diagnosis Date  . Hypertension   . Depression   . Diabetes mellitus   . GERD (gastroesophageal reflux disease)     Past Surgical History  Procedure Laterality Date  . Cesarean section    . Tubal ligation      Current Outpatient Prescriptions  Medication Sig Dispense Refill  . HYDROcodone-acetaminophen (NORCO/VICODIN) 5-325 MG per tablet Take 1 tablet by mouth every 6 (six) hours as needed for moderate pain. 15 tablet 0  . HYDROcodone-acetaminophen (NORCO/VICODIN) 5-325 MG per tablet Take 1-2 tablets by mouth every 4 (four) hours as needed for moderate pain or severe pain. 20 tablet 0  . insulin NPH-regular Human (NOVOLIN 70/30) (70-30) 100  UNIT/ML injection Inject into the skin 2 (two) times daily with a meal. 10u with breakfast and 8u with dinner    . LORazepam (ATIVAN) 0.5 MG tablet Take 1 tablet (0.5 mg total) by mouth daily as needed for anxiety. 15 tablet 0  . metFORMIN (GLUCOPHAGE) 1000 MG tablet Take 1,000 mg by mouth 2 (two) times daily with a meal.    . omeprazole (PRILOSEC) 20 MG capsule Take 1 capsule (20 mg total) by mouth daily. 30 capsule 3  . pantoprazole (PROTONIX) 40 MG tablet Take 1 tablet (40 mg total) by mouth daily. 14 tablet 0  . cephALEXin (KEFLEX) 500 MG capsule Take 2 capsules (1,000 mg total) by mouth 2 (two) times daily. (Patient not taking: Reported on 03/19/2014) 28 capsule 0  . ondansetron (ZOFRAN) 4 MG tablet Take 1 tablet (4 mg total) by mouth every 8 (eight) hours as needed for nausea or vomiting. (Patient not taking: Reported on 02/18/2014) 20 tablet 0  . phenazopyridine (PYRIDIUM) 200 MG tablet Take 1 tablet (200 mg total) by mouth 3 (three) times daily as needed for pain. 6 tablet 0  . ranitidine (ZANTAC) 150 MG tablet Take 1 tablet (150 mg total) by mouth 2 (two) times daily. (Patient not taking: Reported on 03/19/2014) 60 tablet 1   No current facility-administered medications for this visit.    Allergies as of 03/19/2014  . (No Known Allergies)    Family History  Problem Relation Age of Onset  . Hypertension Mother   . Diabetes  Father     History   Social History  . Marital Status: Married    Spouse Name: N/A    Number of Children: N/A  . Years of Education: N/A   Occupational History  . Not on file.   Social History Main Topics  . Smoking status: Never Smoker   . Smokeless tobacco: Never Used  . Alcohol Use: No  . Drug Use: No  . Sexual Activity: Yes    Birth Control/ Protection: Surgical   Other Topics Concern  . Not on file   Social History Narrative   ** Merged History Encounter **        Review of Systems: Gen: Denies any fever, admits chills, denies fatigue. Has  had no recent unintended weight loss. Recent decrease of appetite.  CV: Denies chest pain, heart palpitations, peripheral edema.  Resp: Denies shortness of breath at rest or with exertion. Denies wheezing or cough. No difficulty laying flat.  GI: Admits remote history of self-limiting dysphagia about 3-4 months ago. Occasional odynophagia. Denies hematemesis. MS: Denies joint pain, muscle weakness, cramps, or limitation of movement.  Derm: Denies rash, itching, dry skin Psych: Denies depression, anxiety, memory loss, and confusion  Physical Exam: BP 139/97 mmHg  Pulse 65  Temp(Src) 97 F (36.1 C)  Ht 4\' 11"  (1.499 m)  Wt 203 lb 12.8 oz (92.443 kg)  BMI 41.14 kg/m2  LMP 03/13/2014 General:   Obese female. Alert and oriented. Pleasant and cooperative. Well-nourished and well-developed.  Head:  Normocephalic and atraumatic. Eyes:  Without icterus, sclera clear and conjunctiva pink.  Ears:  Normal auditory acuity. Mouth:  No deformity or lesions, oral mucosa pink. No OP edema Neck:  Supple, without mass or thyromegaly. Lungs:  Clear to auscultation bilaterally. No wheezes, rales, or rhonchi. No distress.  Heart:  S1, S2 present without murmurs appreciated.  Abdomen:  +BS, soft, non-distended. Mild generalized TTP with worsening pain to palpation of epigastric area. No HSM noted. No guarding or rebound. No masses appreciated.  Rectal:  Deferred  Msk:  Symmetrical without gross deformities. Normal posture. Pulses:  Normal DP pulses noted. Extremities:  Without clubbing or edema. Neurologic:  Alert and  oriented x4;  grossly normal neurologically. Skin:  Intact without significant lesions or rashes. Cervical Nodes:  No significant cervical adenopathy. Psych:  Alert and cooperative. Normal mood and affect.     03/19/2014 9:01 AM

## 2014-03-20 NOTE — Progress Notes (Signed)
Quick Note:  Pt is aware of results. Routing to [place] to send copy to PCP. ______

## 2014-03-25 NOTE — Progress Notes (Signed)
cc'ed to pcp °

## 2014-03-27 ENCOUNTER — Ambulatory Visit (INDEPENDENT_AMBULATORY_CARE_PROVIDER_SITE_OTHER): Payer: No Typology Code available for payment source | Admitting: Family Medicine

## 2014-03-27 ENCOUNTER — Telehealth: Payer: Self-pay | Admitting: Family Medicine

## 2014-03-27 ENCOUNTER — Other Ambulatory Visit (HOSPITAL_COMMUNITY)
Admission: RE | Admit: 2014-03-27 | Discharge: 2014-03-27 | Disposition: A | Discharge status: Home / Self Care | Payer: No Typology Code available for payment source | Source: Ambulatory Visit | Attending: Family Medicine | Admitting: Family Medicine

## 2014-03-27 ENCOUNTER — Encounter: Payer: Self-pay | Admitting: Family Medicine

## 2014-03-27 VITALS — BP 124/82 | HR 67 | Temp 98.1°F | Ht 59.0 in | Wt 203.0 lb

## 2014-03-27 DIAGNOSIS — N939 Abnormal uterine and vaginal bleeding, unspecified: Secondary | ICD-10-CM

## 2014-03-27 DIAGNOSIS — B379 Candidiasis, unspecified: Secondary | ICD-10-CM

## 2014-03-27 DIAGNOSIS — N898 Other specified noninflammatory disorders of vagina: Secondary | ICD-10-CM

## 2014-03-27 DIAGNOSIS — Z0100 Encounter for examination of eyes and vision without abnormal findings: Secondary | ICD-10-CM

## 2014-03-27 DIAGNOSIS — R102 Pelvic and perineal pain: Secondary | ICD-10-CM | POA: Insufficient documentation

## 2014-03-27 DIAGNOSIS — N739 Female pelvic inflammatory disease, unspecified: Secondary | ICD-10-CM

## 2014-03-27 DIAGNOSIS — Z113 Encounter for screening for infections with a predominantly sexual mode of transmission: Secondary | ICD-10-CM | POA: Insufficient documentation

## 2014-03-27 DIAGNOSIS — L298 Other pruritus: Secondary | ICD-10-CM

## 2014-03-27 LAB — POCT UA - MICROSCOPIC ONLY

## 2014-03-27 LAB — POCT URINALYSIS DIPSTICK
BILIRUBIN UA: NEGATIVE
GLUCOSE UA: 500
KETONES UA: NEGATIVE
LEUKOCYTES UA: NEGATIVE
NITRITE UA: NEGATIVE
PH UA: 5.5
Protein, UA: NEGATIVE
Spec Grav, UA: 1.02
UROBILINOGEN UA: 0.2

## 2014-03-27 LAB — POCT WET PREP (WET MOUNT): CLUE CELLS WET PREP WHIFF POC: NEGATIVE

## 2014-03-27 LAB — POCT URINE PREGNANCY: PREG TEST UR: NEGATIVE

## 2014-03-27 MED ORDER — CEFTRIAXONE SODIUM 1 G IJ SOLR
250.0000 mg | Freq: Once | INTRAMUSCULAR | Status: AC
  Administered 2014-03-27: 250 mg via INTRAMUSCULAR

## 2014-03-27 MED ORDER — AZITHROMYCIN 250 MG PO TABS: 1000.0000 mg | ORAL_TABLET | Freq: Every day | ORAL | Status: DC

## 2014-03-27 MED ORDER — AZITHROMYCIN 250 MG PO TABS
1000.0000 mg | ORAL_TABLET | Freq: Once | ORAL | Status: AC
  Administered 2014-03-27: 1000 mg via ORAL

## 2014-03-27 MED ORDER — FLUCONAZOLE 150 MG PO TABS: 150.0000 mg | ORAL_TABLET | Freq: Once | ORAL | Status: DC

## 2014-03-27 NOTE — Progress Notes (Signed)
Patient ID: [name], female   DOB: 1976/03/01, 39 y.o.   MRN: [id number]  [name], MD Phone: [phone]  [name] is a 39 y.o. female who presents today for f/u.  Vaginal discharge: noted for at least the past week. States it has been thick and white. There is itching. Some odor to this. She is sexually active, states last intercourse was at the end of November or December. Is sexually active with one partner, her husband. Does not use condoms. No history of STDs. No fevers. LMP 12/31.  Requests referral to ophthalmologist. Notes she is due for her yearly exam. Requests referral to [facility].   Patient is a nonsmoker.   ROS: Per HPI   Physical Exam Filed Vitals:   03/27/14 1633  BP: 124/82  Pulse: 67  Temp: 98.1 F (36.7 C)    Gen: Well NAD HEENT: PERRL,  MMM Lungs: CTABL Nl WOB Heart: RRR  GU: normal labia, normal vaginal mucosa, cervical os with brown discharge, no cervical lesions or erythema, there is cervical motion tenderness on bimanual exam, no adnexal tenderness or suprapubic tenderness   Assessment/Plan: Please see individual problem list.  [name], MD [facility] PGY-3

## 2014-03-27 NOTE — Telephone Encounter (Signed)
Called patient to inform of positive yeast on wet prep and that diflucan was sent to the pharmacy. Reinforced no sexual activity until the results of her GC/chlamydia testing comes back. [employer] interpretor [phone] was used for this call.

## 2014-03-27 NOTE — Patient Instructions (Addendum)
Nice to see you. You may have a cervix or uterine infection. We gave you antibiotics for this.  If you develop fever or abdominal pain or nausea or vomiting please seek medical attention.   Enfermedad inflamatoria plvica (Pelvic Inflammatory Disease)  La enfermedad inflamatoria plvica (PID) es una infeccin en algunos o todos de los rganos femeninos. La infeccin puede estar en el tero, en los ovarios, en las trompas de Falopio o en los tejidos circundantes de la zona baja del vientre (pelvis).. CUIDADOS EN EL HOGAR   Si le indican antibiticos, tmelos tal como se le indic. Tmelos todos, aunque se sienta mejor.  Slo tome los medicamentos segn le indique el mdico.  No tenga sexo (relaciones sexuales) hasta completar el tratamiento o segn las indicaciones del mdico.  Comunique a su compaero sexual que sufre una PID. Su pareja podra Warden/ranger.  Cumpla con los controles mdicos segn las indicaciones. SOLICITE AYUDA DE INMEDIATO SI:   Tiene fiebre.  Siente ms dolor en el vientre (abdominal) o en el bajo vientre.  Siente escalofros.  Le duele al hacer pis [place]).  No mejora luego de 72 horas.  Observa ms lquido (secrecin) en la vagina o un lquido que no es normal.  Necesita que el mdico le recete analgsicos para Conservation officer, historic buildings.  Vomita.  No puede tomar los medicamentos.  Su pareja sufre una enfermedad de transmisin sexual (ETS). ASEGRESE DE QUE:   Comprende estas instrucciones.  Controlar su enfermedad.  Solicitar ayuda de inmediato si no mejora o si empeora. Document Released: 08/30/2011 Document Revised: 06/25/2012 [facility] Patient Information 2015 [place], [place]. This information is not intended to replace advice given to you by your health care provider. Make sure you discuss any questions you have with your health care provider.

## 2014-03-27 NOTE — Assessment & Plan Note (Signed)
Patient with vaginal discharge noted for >1 week found to have cervical motion tenderness and discharge from cervical os on exam. Clinical diagnosis is PID, though could also be a cervicitis. GC/chlamydia and wet prep were collected. She was treated with rocephin and azithromycin. She was advised to not have sex until the results of today's tests return. Given return precautions.

## 2014-03-27 NOTE — Assessment & Plan Note (Signed)
Yeast seen on wet prep. Diflucan sent to pharmacy.

## 2014-03-31 LAB — GC/CHLAMYDIA PROBE AMP (~~LOC~~) NOT AT ARMC
CHLAMYDIA, DNA PROBE: NEGATIVE
NEISSERIA GONORRHEA: NEGATIVE

## 2014-04-01 ENCOUNTER — Encounter: Payer: Self-pay | Admitting: *Deleted

## 2014-04-07 ENCOUNTER — Encounter (HOSPITAL_COMMUNITY): Payer: Self-pay | Admitting: *Deleted

## 2014-04-07 ENCOUNTER — Encounter (HOSPITAL_COMMUNITY)
Admission: RE | Disposition: A | Discharge status: Home / Self Care | Payer: Self-pay | Source: Ambulatory Visit | Attending: Gastroenterology

## 2014-04-07 ENCOUNTER — Telehealth: Payer: Self-pay | Admitting: Family Medicine

## 2014-04-07 ENCOUNTER — Ambulatory Visit (HOSPITAL_COMMUNITY)
Admission: RE | Admit: 2014-04-07 | Discharge: 2014-04-07 | Disposition: A | Discharge status: Home / Self Care | Payer: No Typology Code available for payment source | Source: Ambulatory Visit | Attending: Gastroenterology | Admitting: Gastroenterology

## 2014-04-07 DIAGNOSIS — K219 Gastro-esophageal reflux disease without esophagitis: Secondary | ICD-10-CM

## 2014-04-07 DIAGNOSIS — E118 Type 2 diabetes mellitus with unspecified complications: Secondary | ICD-10-CM | POA: Insufficient documentation

## 2014-04-07 DIAGNOSIS — F329 Major depressive disorder, single episode, unspecified: Secondary | ICD-10-CM | POA: Insufficient documentation

## 2014-04-07 DIAGNOSIS — K648 Other hemorrhoids: Secondary | ICD-10-CM | POA: Insufficient documentation

## 2014-04-07 DIAGNOSIS — R1013 Epigastric pain: Secondary | ICD-10-CM | POA: Insufficient documentation

## 2014-04-07 DIAGNOSIS — K2950 Unspecified chronic gastritis without bleeding: Secondary | ICD-10-CM | POA: Insufficient documentation

## 2014-04-07 DIAGNOSIS — K5730 Diverticulosis of large intestine without perforation or abscess without bleeding: Secondary | ICD-10-CM | POA: Insufficient documentation

## 2014-04-07 DIAGNOSIS — K2980 Duodenitis without bleeding: Secondary | ICD-10-CM | POA: Insufficient documentation

## 2014-04-07 DIAGNOSIS — Z1211 Encounter for screening for malignant neoplasm of colon: Secondary | ICD-10-CM

## 2014-04-07 DIAGNOSIS — K621 Rectal polyp: Secondary | ICD-10-CM | POA: Insufficient documentation

## 2014-04-07 DIAGNOSIS — I10 Essential (primary) hypertension: Secondary | ICD-10-CM | POA: Insufficient documentation

## 2014-04-07 DIAGNOSIS — B9689 Other specified bacterial agents as the cause of diseases classified elsewhere: Secondary | ICD-10-CM | POA: Insufficient documentation

## 2014-04-07 DIAGNOSIS — K921 Melena: Secondary | ICD-10-CM | POA: Insufficient documentation

## 2014-04-07 DIAGNOSIS — K2971 Gastritis, unspecified, with bleeding: Secondary | ICD-10-CM

## 2014-04-07 DIAGNOSIS — E119 Type 2 diabetes mellitus without complications: Secondary | ICD-10-CM | POA: Insufficient documentation

## 2014-04-07 DIAGNOSIS — Q438 Other specified congenital malformations of intestine: Secondary | ICD-10-CM | POA: Insufficient documentation

## 2014-04-07 DIAGNOSIS — Z79899 Other long term (current) drug therapy: Secondary | ICD-10-CM | POA: Insufficient documentation

## 2014-04-07 HISTORY — PX: ESOPHAGOGASTRODUODENOSCOPY: SHX5428

## 2014-04-07 HISTORY — PX: COLONOSCOPY: SHX5424

## 2014-04-07 LAB — GLUCOSE, CAPILLARY: GLUCOSE-CAPILLARY: 255 mg/dL — AB (ref 70–99)

## 2014-04-07 SURGERY — COLONOSCOPY
Anesthesia: Moderate Sedation

## 2014-04-07 MED ORDER — OMEPRAZOLE 20 MG PO CPDR: 20.0000 mg | DELAYED_RELEASE_CAPSULE | Freq: Two times a day (BID) | ORAL | Status: DC

## 2014-04-07 MED ORDER — LIDOCAINE VISCOUS 2 % MT SOLN
OROMUCOSAL | Status: DC | PRN
  Administered 2014-04-07: 1 via OROMUCOSAL

## 2014-04-07 MED ORDER — RANITIDINE HCL 150 MG PO TABS: 150.0000 mg | ORAL_TABLET | Freq: Two times a day (BID) | ORAL | Status: DC | PRN

## 2014-04-07 MED ORDER — SODIUM CHLORIDE 0.9 % IV SOLN
INTRAVENOUS | Status: DC
  Administered 2014-04-07: 10:00:00 via INTRAVENOUS

## 2014-04-07 MED ORDER — MEPERIDINE HCL 100 MG/ML IJ SOLN
INTRAMUSCULAR | Status: AC
  Filled 2014-04-07: qty 2

## 2014-04-07 MED ORDER — MEPERIDINE HCL 100 MG/ML IJ SOLN
INTRAMUSCULAR | Status: DC | PRN
  Administered 2014-04-07 (×4): 25 mg via INTRAVENOUS

## 2014-04-07 MED ORDER — MIDAZOLAM HCL 5 MG/5ML IJ SOLN
INTRAMUSCULAR | Status: AC
  Filled 2014-04-07: qty 10

## 2014-04-07 MED ORDER — MIDAZOLAM HCL 5 MG/5ML IJ SOLN
INTRAMUSCULAR | Status: DC | PRN
  Administered 2014-04-07: 1 mg via INTRAVENOUS
  Administered 2014-04-07 (×2): 2 mg via INTRAVENOUS
  Administered 2014-04-07 (×2): 1 mg via INTRAVENOUS

## 2014-04-07 MED ORDER — SIMETHICONE 40 MG/0.6ML PO SUSP
ORAL | Status: DC | PRN
  Administered 2014-04-07: 11:00:00

## 2014-04-07 MED ORDER — LIDOCAINE VISCOUS 2 % MT SOLN
OROMUCOSAL | Status: AC
  Filled 2014-04-07: qty 15

## 2014-04-07 NOTE — Progress Notes (Signed)
Discharge Instructions given via [employer].  Interpreter Ernst Breach [phone]

## 2014-04-07 NOTE — Telephone Encounter (Signed)
Gave pt results to STD; all were negative Has appt on 1/27 for persistent vag irritation. Pt verbalized understanding

## 2014-04-07 NOTE — Telephone Encounter (Signed)
Patients asked for STD test done more than a week ago. Please, follow up with Patient (Spanish).

## 2014-04-07 NOTE — OR Nursing (Signed)
Used interpreter line for assessment. Interpreter number [phone].

## 2014-04-07 NOTE — Discharge Instructions (Addendum)
You had 1 polyp removed FROM YOUR RECTUM. YOUR PREP WAS NOT GOOD IN THE RIGHT COLON. You have small internal hemorrhoids & DIVERTICULOSIS IN YOUR LEFT COLON. You have MODERATE gastritis, & MILD duodenitis.  I biopsied your stomach AND DUODENUM.    CONTINUE YOUR WEIGHT LOSS EFFORTS. LOSE 10 MORE LBS.  DRINK WATER TO KEEP YOUR URINE LIGHT YELLOW.  CONTINUE OMEPRAZOLE.  TAKE 30 MINUTES MEALS TWICE DAILY.  AVOID ITEMS THAT TRIGGER GASTRITIS. SEE INFO BELOW.  FOLLOW A HIGH FIBER/LOW FAT DIET. AVOID ITEMS THAT CAUSE BLOATING. SEE INFO BELOW.  YOUR BIOPSY WILL BE BACK IN 14 DAYS OR YOU CAN LOOK THEM UP ON MY CHART AFTER JAN 28.  YOU NEED TO REPEAT YOUR COLONOSCOPY WITHIN THE NEXT MONTH.  FOLLOW UP IN 4 MOS.   ENDOSCOPY Care After Read the instructions outlined below and refer to this sheet in the next week. These discharge instructions provide you with general information on caring for yourself after you leave the hospital. While your treatment has been planned according to the most current medical practices available, unavoidable complications occasionally occur. If you have any problems or questions after discharge, call DR. FIELDS, [phone].  ACTIVITY  You may resume your regular activity, but move at a slower pace for the next 24 hours.   Take frequent rest periods for the next 24 hours.   Walking will help get rid of the air and reduce the bloated feeling in your belly (abdomen).   No driving for 24 hours (because of the medicine (anesthesia) used during the test).   You may shower.   Do not sign any important legal documents or operate any machinery for 24 hours (because of the anesthesia used during the test).    NUTRITION  Drink plenty of fluids.   You may resume your normal diet as instructed by your doctor.   Begin with a light meal and progress to your normal diet. Heavy or fried foods are harder to digest and may make you feel sick to your stomach (nauseated).    Avoid alcoholic beverages for 24 hours or as instructed.    MEDICATIONS  You may resume your normal medications.   WHAT YOU CAN EXPECT TODAY  Some feelings of bloating in the abdomen.   Passage of more gas than usual.   Spotting of blood in your stool or on the toilet paper  .  IF YOU HAD POLYPS REMOVED DURING THE ENDOSCOPY:  Eat a soft diet IF YOU HAVE NAUSEA, BLOATING, ABDOMINAL PAIN, OR VOMITING.    FINDING OUT THE RESULTS OF YOUR TEST Not all test results are available during your visit. DR. Oneida Alar WILL CALL YOU WITHIN 14 DAYS OF YOUR PROCEDUE WITH YOUR RESULTS. Do not assume everything is normal if you have not heard from DR. FIELDS, CALL HER OFFICE AT [phone].  SEEK IMMEDIATE MEDICAL ATTENTION AND CALL THE OFFICE: [phone] IF:  You have more than a spotting of blood in your stool.   Your belly is swollen (abdominal distention).   You are nauseated or vomiting.   You have a temperature over 101F.   You have abdominal pain or discomfort that is severe or gets worse throughout the day.   Gastritis/DUODENITIS  Gastritis/DUODENITIS is inflammation (the body's way of reacting to injury and/or infection) of the stomach/SMALLBOWEL. It is often caused by viral or bacterial (germ) infections. It can also be caused BY ASPIRIN, BC/GOODY POWDER'S, (IBUPROFEN) MOTRIN, OR ALEVE (NAPROXEN), chemicals (including alcohol), SPICY FOODS, and medications. This  illness may be associated with generalized malaise (feeling tired, not well), UPPER ABDOMINAL STOMACH cramps, and fever. One common bacterial cause of gastritis is an organism known as H. Pylori. This can be treated with antibiotics.    High-Fiber Diet A high-fiber diet changes your normal diet to include more whole grains, legumes, fruits, and vegetables. Changes in the diet involve replacing refined carbohydrates with unrefined foods. The calorie level of the diet is essentially unchanged. The Dietary Reference  Intake (recommended amount) for adult males is 38 grams per day. For adult females, it is 25 grams per day. Pregnant and lactating women should consume 28 grams of fiber per day. Fiber is the intact part of a plant that is not broken down during digestion. Functional fiber is fiber that has been isolated from the plant to provide a beneficial effect in the body. PURPOSE  Increase stool bulk.   Ease and regulate bowel movements.   Lower cholesterol.  INDICATIONS THAT YOU NEED MORE FIBER  Constipation and hemorrhoids.   Uncomplicated diverticulosis (intestine condition) and irritable bowel syndrome.   Weight management.   As a protective measure against hardening of the arteries (atherosclerosis), diabetes, and cancer.   GUIDELINES FOR INCREASING FIBER IN THE DIET  Start adding fiber to the diet slowly. A gradual increase of about 5 more grams (2 slices of whole-wheat bread, 2 servings of most fruits or vegetables, or 1 bowl of high-fiber cereal) per day is best. Too rapid an increase in fiber may result in constipation, flatulence, and bloating.   Drink enough water and fluids to keep your urine clear or pale yellow. Water, juice, or caffeine-free drinks are recommended. Not drinking enough fluid may cause constipation.   Eat a variety of high-fiber foods rather than one type of fiber.   Try to increase your intake of fiber through using high-fiber foods rather than fiber pills or supplements that contain small amounts of fiber.   The goal is to change the types of food eaten. Do not supplement your present diet with high-fiber foods, but replace foods in your present diet.  INCLUDE A VARIETY OF FIBER SOURCES  Replace refined and processed grains with whole grains, canned fruits with fresh fruits, and incorporate other fiber sources. White rice, white breads, and most bakery goods contain little or no fiber.   Brown whole-grain rice, buckwheat oats, and many fruits and vegetables are  all good sources of fiber. These include: broccoli, Brussels sprouts, cabbage, cauliflower, beets, sweet potatoes, white potatoes (skin on), carrots, tomatoes, eggplant, squash, berries, fresh fruits, and dried fruits.   Cereals appear to be the richest source of fiber. Cereal fiber is found in whole grains and bran. Bran is the fiber-rich outer coat of cereal grain, which is largely removed in refining. In whole-grain cereals, the bran remains. In breakfast cereals, the largest amount of fiber is found in those with "bran" in their names. The fiber content is sometimes indicated on the label.   You may need to include additional fruits and vegetables each day.   In baking, for 1 cup white flour, you may use the following substitutions:   1 cup whole-wheat flour minus 2 tablespoons.   1/2 cup white flour plus 1/2 cup whole-wheat flour.   Low-Fat Diet BREADS, CEREALS, PASTA, RICE, DRIED PEAS, AND BEANS These products are high in carbohydrates and most are low in fat. Therefore, they can be increased in the diet as substitutes for fatty foods. They too, however, contain calories and should  not be eaten in excess. Cereals can be eaten for snacks as well as for breakfast.  Include foods that contain fiber (fruits, vegetables, whole grains, and legumes). Research shows that fiber may lower blood cholesterol levels, especially the water-soluble fiber found in fruits, vegetables, oat products, and legumes. FRUITS AND VEGETABLES It is good to eat fruits and vegetables. Besides being sources of fiber, both are rich in vitamins and some minerals. They help you get the daily allowances of these nutrients. Fruits and vegetables can be used for snacks and desserts. MEATS Limit lean meat, chicken, [place], and fish to no more than 6 ounces per day. Beef, Pork, and Lamb Use lean cuts of beef, pork, and lamb. Lean cuts include:  Extra-lean ground beef.  Arm roast.  Sirloin tip.  Center-cut ham.  Round  steak.  Loin chops.  Rump roast.  Tenderloin.  Trim all fat off the outside of meats before cooking. It is not necessary to severely decrease the intake of red meat, but lean choices should be made. Lean meat is rich in protein and contains a highly absorbable form of iron. Premenopausal women, in particular, should avoid reducing lean red meat because this could increase the risk for low red blood cells (iron-deficiency anemia). The organ meats, such as liver, sweetbreads, kidneys, and brain are very rich in cholesterol. They should be limited. Chicken and [place] These are good sources of protein. The fat of poultry can be reduced by removing the skin and underlying fat layers before cooking. Chicken and [place] can be substituted for lean red meat in the diet. Poultry should not be fried or covered with high-fat sauces. Fish and Shellfish Fish is a good source of protein. Shellfish contain cholesterol, but they usually are low in saturated fatty acids. The preparation of fish is important. Like chicken and [place], they should not be fried or covered with high-fat sauces. EGGS Egg whites contain no fat or cholesterol. They can be eaten often. Try 1 to 2 egg whites instead of whole eggs in recipes or use egg substitutes that do not contain yolk. MILK AND DAIRY PRODUCTS Use skim or 1% milk instead of 2% or whole milk. Decrease whole milk, natural, and processed cheeses. Use nonfat or low-fat (2%) cottage cheese or low-fat cheeses made from vegetable oils. Choose nonfat or low-fat (1 to 2%) yogurt. Experiment with evaporated skim milk in recipes that call for heavy cream. Substitute low-fat yogurt or low-fat cottage cheese for sour cream in dips and salad dressings. Have at least 2 servings of low-fat dairy products, such as 2 glasses of skim (or 1%) milk each day to help get your daily calcium intake.  FATS AND OILS Reduce the total intake of fats, especially saturated fat. Butterfat, lard, and beef  fats are high in saturated fat and cholesterol. These should be avoided as much as possible. Vegetable fats do not contain cholesterol, but certain vegetable fats, such as coconut oil, palm oil, and palm kernel oil are very high in saturated fats. These should be limited. These fats are often used in bakery goods, processed foods, popcorn, oils, and nondairy creamers. Vegetable shortenings and some peanut butters contain hydrogenated oils, which are also saturated fats. Read the labels on these foods and check for saturated vegetable oils. Unsaturated vegetable oils and fats do not raise blood cholesterol. However, they should be limited because they are fats and are high in calories. Total fat should still be limited to 30% of your daily caloric intake. Desirable  liquid vegetable oils are corn oil, cottonseed oil, olive oil, canola oil, safflower oil, soybean oil, and sunflower oil. Peanut oil is not as good, but small amounts are acceptable. Buy a heart-healthy tub margarine that has no partially hydrogenated oils in the ingredients. Mayonnaise and salad dressings often are made from unsaturated fats, but they should also be limited because of their high calorie and fat content. Seeds, nuts, peanut butter, olives, and avocados are high in fat, but the fat is mainly the unsaturated type. These foods should be limited mainly to avoid excess calories and fat. OTHER EATING TIPS Snacks  Most sweets should be limited as snacks. They tend to be rich in calories and fats, and their caloric content outweighs their nutritional value. Some good choices in snacks are graham crackers, melba toast, soda crackers, bagels (no egg), English muffins, fruits, and vegetables. These snacks are preferable to snack crackers, [place] fries, and chips. Popcorn should be air-popped or cooked in small amounts of liquid vegetable oil. Desserts Eat fruit, low-fat yogurt, and fruit ices. AVOID pastries, cake, and cookies. Sherbet, angel  food cake, gelatin dessert, frozen low-fat yogurt, or other frozen products that do not contain saturated fat (pure fruit juice bars, frozen ice pops) are also acceptable.  COOKING METHODS Choose those methods that use little or no fat. They include: Poaching.  Braising.  Steaming.  Grilling.  Baking.  Stir-frying.  Broiling.  Microwaving.  Foods can be cooked in a nonstick pan without added fat, or use a nonfat cooking spray in regular cookware. Limit fried foods and avoid frying in saturated fat. Add moisture to lean meats by using water, broth, cooking wines, and other nonfat or low-fat sauces along with the cooking methods mentioned above. Soups and stews should be chilled after cooking. The fat that forms on top after a few hours in the refrigerator should be skimmed off. When preparing meals, avoid using excess salt. Salt can contribute to raising blood pressure in some people. EATING AWAY FROM HOME Order entres, potatoes, and vegetables without sauces or butter. When meat exceeds the size of a deck of cards (3 to 4 ounces), the rest can be taken home for another meal. Choose vegetable or fruit salads and ask for low-calorie salad dressings to be served on the side. Use dressings sparingly. Limit high-fat toppings, such as bacon, crumbled eggs, cheese, sunflower seeds, and olives. Ask for heart-healthy tub margarine instead of butter.   Diverticulosis Diverticulosis is a common condition that develops when small pouches (diverticula) form in the wall of the colon. The risk of diverticulosis increases with age. It happens more often in people who eat a low-fiber diet. Most individuals with diverticulosis have no symptoms. Those individuals with symptoms usually experience belly (abdominal) pain, constipation, or loose stools (diarrhea).  HOME CARE INSTRUCTIONS  Increase the amount of fiber in your diet as directed by your caregiver or dietician. This may reduce symptoms of  diverticulosis.   Drink at least 6 to 8 glasses of water each day to prevent constipation.   Try not to strain when you have a bowel movement.   Avoiding nuts and seeds to prevent complications is still an uncertain benefit.       FOODS HAVING HIGH FIBER CONTENT INCLUDE:  Fruits. Apple, peach, pear, tangerine, raisins, prunes.   Vegetables. Brussels sprouts, asparagus, broccoli, cabbage, carrot, cauliflower, romaine lettuce, spinach, summer squash, tomato, winter squash, zucchini.   Starchy Vegetables. Baked beans, kidney beans, lima beans, split peas, lentils, potatoes (with  skin).   Grains. Whole wheat bread, brown rice, bran flake cereal, plain oatmeal, white rice, shredded wheat, bran muffins.   Polyps, Colon  A polyp is extra tissue that grows inside your body. Colon polyps grow in the large intestine. The large intestine, also called the colon, is part of your digestive system. It is a long, hollow tube at the end of your digestive tract where your body makes and stores stool. Most polyps are not dangerous. They are benign. This means they are not cancerous. But over time, some types of polyps can turn into cancer. Polyps that are smaller than a pea are usually not harmful. But larger polyps could someday become or may already be cancerous. To be safe, doctors remove all polyps and test them.   WHO GETS POLYPS? Anyone can get polyps, but certain people are more likely than others. You may have a greater chance of getting polyps if:  You are over 50.   You have had polyps before.   Someone in your family has had polyps.   Someone in your family has had cancer of the large intestine.   Find out if someone in your family has had polyps. You may also be more likely to get polyps if you:   Eat a lot of fatty foods   Smoke   Drink alcohol   Do not exercise  Eat too much   TREATMENT  The caregiver will remove the polyp during sigmoidoscopy or colonoscopy.   PREVENTION There is not one sure way to prevent polyps. You might be able to lower your risk of getting them if you:  Eat more fruits and vegetables and less fatty food.   Do not smoke.   Avoid alcohol.   Exercise every day.   Lose weight if you are overweight.   Eating more calcium and folate can also lower your risk of getting polyps. Some foods that are rich in calcium are milk, cheese, and broccoli. Some foods that are rich in folate are chickpeas, kidney beans, and spinach.

## 2014-04-07 NOTE — H&P (Signed)
Primary Care Physician:  [name], MD Primary Gastroenterologist:  Dr. Oneida Alar  Pre-Procedure History & Physical: HPI:  [name] is a 40 y.o. female here for RECTAL BLEEDING/MELENA/epigastric pain.  Past Medical History  Diagnosis Date  . Hypertension   . Depression   . Diabetes mellitus   . GERD (gastroesophageal reflux disease)     Past Surgical History  Procedure Laterality Date  . Cesarean section    . Tubal ligation      Prior to Admission medications   Medication Sig Start Date End Date Taking? Authorizing Provider  fluconazole (DIFLUCAN) 150 MG tablet Take 1 tablet (150 mg total) by mouth once. 03/27/14  Yes Leone Haven, MD  HYDROcodone-acetaminophen (NORCO/VICODIN) 5-325 MG per tablet Take 1-2 tablets by mouth every 4 (four) hours as needed for moderate pain or severe pain. 02/18/14  Yes Charlesetta Shanks, MD  insulin NPH-regular Human (NOVOLIN 70/30) (70-30) 100 UNIT/ML injection Inject 8-10 Units into the skin 2 (two) times daily with a meal. 10u with breakfast and 8u with dinner   Yes Historical Provider, MD  LORazepam (ATIVAN) 0.5 MG tablet Take 1 tablet (0.5 mg total) by mouth daily as needed for anxiety. 12/12/13  Yes Carmin Muskrat, MD  metFORMIN (GLUCOPHAGE) 1000 MG tablet Take 1,000 mg by mouth 2 (two) times daily with a meal.   Yes Historical Provider, MD  omeprazole (PRILOSEC) 20 MG capsule Take 1 capsule (20 mg total) by mouth daily. 01/30/14  Yes Olam Idler, MD  pantoprazole (PROTONIX) 40 MG tablet Take 1 tablet (40 mg total) by mouth daily. 12/13/13  Yes Carmin Muskrat, MD  peg 3350 powder (MOVIPREP) 100 G SOLR Take 1 kit (200 g total) by mouth as directed. 03/19/14  Yes Danie Binder, MD  phenazopyridine (PYRIDIUM) 200 MG tablet Take 1 tablet (200 mg total) by mouth 3 (three) times daily as needed for pain. 02/18/14  Yes Charlesetta Shanks, MD  HYDROcodone-acetaminophen (NORCO/VICODIN) 5-325 MG per tablet Take 1 tablet by mouth every 6 (six)  hours as needed for moderate pain. Patient not taking: Reported on 03/28/2014 12/12/13   Carmin Muskrat, MD  ondansetron (ZOFRAN) 4 MG tablet Take 1 tablet (4 mg total) by mouth every 8 (eight) hours as needed for nausea or vomiting. Patient not taking: Reported on 02/18/2014 02/03/14   Patrecia Pour, MD  ranitidine (ZANTAC) 150 MG tablet Take 1 tablet (150 mg total) by mouth 2 (two) times daily. Patient not taking: Reported on 03/19/2014 02/03/14   Patrecia Pour, MD    Allergies as of 03/19/2014  . (No Known Allergies)    Family History  Problem Relation Age of Onset  . Hypertension Mother   . Diabetes Father   . Colon cancer Neg Hx     History   Social History  . Marital Status: Single    Spouse Name: N/A    Number of Children: N/A  . Years of Education: N/A   Occupational History  . Not on file.   Social History Main Topics  . Smoking status: Never Smoker   . Smokeless tobacco: Never Used  . Alcohol Use: No  . Drug Use: No  . Sexual Activity: Yes    Birth Control/ Protection: Surgical   Other Topics Concern  . Not on file   Social History Narrative   ** Merged History Encounter **        Review of Systems: See HPI, otherwise negative ROS   Physical Exam: BP 113/66 mmHg  Pulse 71  Temp(Src) 97.8 F (36.6 C) (Oral)  Resp 24  Ht 4' 11"  (1.499 m)  Wt 203 lb (92.08 kg)  BMI 40.98 kg/m2  SpO2 100%  LMP 03/13/2014 General:   Alert,  pleasant and cooperative in NAD Head:  Normocephalic and atraumatic. Neck:  Supple; Lungs:  Clear throughout to auscultation.    Heart:  Regular rate and rhythm. Abdomen:  Soft, nontender and nondistended. Normal bowel sounds, without guarding, and without rebound.   Neurologic:  Alert and  oriented x4;  grossly normal neurologically.  Impression/Plan:   RECTAL BLEEDING/MELENA/epigastric pain.  PLAN:  1. TCS/EGD TODAY

## 2014-04-08 NOTE — Op Note (Addendum)
NAME:  [name], [name]       ACCOUNT NO.:  [id number]  MEDICAL RECORD NO.:  [id number]  LOCATION:  [facility]                          FACILITY:  [facility]  PHYSICIAN:  Barney Drain, M.D.     DATE OF BIRTH:  10/27/1975  DATE OF PROCEDURE: 04/07/2014 DATE OF DISCHARGE:  04/07/2014                              OPERATIVE REPORT   REFERRING PROVIDER:  [name], MD  PROCEDURE: 1. Colonoscopy with cold forceps polypectomy. 2. Esophagogastroduodenoscopy with cold forceps biopsy.  INDICATION FOR EXAM:  [name] is a 39 year old female who presents with rectal bleeding, melena, and epigastric pain.  She is Spanish-speaking only.  History is limited by her language barrier.  FINDINGS: 1. Normal terminal ileum.  Approximately 10 cm visualized. 2. Poor bowel prep. 3. Redundant left colon. 4. 4 mm sessile rectal polyp removed via cold forceps. 5. Small internal hemorrhoids. 6. Normal esophageal mucosa. 7. Mild erythema and edema in the antrum.  Gastric cold forceps     biopsies obtained. 8. Mild erythema in the duodenal bulb.  Normal second portion of the     duodenum.  IMPRESSION: 1. Epigastric pain.  Most likely due to gastritis and duodenitis and     possibly reflux. 2. One colon polyp removed. 3. Small internal hemorrhoids. 4. Mild diverticulosis in the left colon. 5. Redundant left colon. 6. Small internal hemorrhoids, most likely reason for the rectal     bleeding.  RECOMMENDATIONS: 1. Continue weight loss, however. 2. Drink water to keep urine light yellow. 3. Continue omeprazole. Take 30 minutes prior to meals twice daily. 4. Avoid items that cause gastritis. 5. Follow a high-fiber low-fat diet. 6. Await biopsy. 7. Repeat colonoscopy within next month with clear liquid diet. 8. Follow up in 4 months.  MEDICATIONS: 1. Demerol 100 mg IV. 2. Versed 6 mg IV.  CECAL WITHDRAWAL TIME:  7 minutes.  PROCEDURE TECHNIQUE:  Physical exam was performed.  Informed  consent was obtained with the patient explained the benefits, risks, and alternatives to the procedure. The patient was connected , monitored, and placed in left lateral position.  Continuous oxygen was provided by nasal cannula and IV medicine administered through an indwelling cannula.  After administration of sedation and rectal exam, the patient's rectum was intubated and scope was advanced under direct visualization to the terminal ileum.  Scope was removed followed by careful exam of color, texture, anatomy, and integrity of mucosal way out.  The colonoscope was exchanged for a gastroscope.  The patient's esophagus was intubated and the scope was advanced under direct visualization to the second portion of the duodenum.  Scope was removed followed by careful exam of color, texture, anatomy, and integrity mucosal way out.  The patient was recovered in endoscopy and discharged home in satisfactory condition.  PATH: HYPERPLASTIC POLYPS; I PERSONALLY REVIEWED PAT SPECIMEN WITH DR. LI-H PYLORI GASTRITIS WITH DUODENITIS. NEEDS OPV TO DISCUSS REPEAT TCS AND Rx FOR H PYLORI GASTRITIS.   Barney Drain, M.D.     SF/MEDQ  D:  04/08/2014  T:  04/08/2014  Job:  [id number]  cc:   [name], MD

## 2014-04-09 ENCOUNTER — Other Ambulatory Visit (HOSPITAL_COMMUNITY)
Admission: RE | Admit: 2014-04-09 | Discharge: 2014-04-09 | Disposition: A | Discharge status: Home / Self Care | Payer: No Typology Code available for payment source | Source: Ambulatory Visit | Attending: Family Medicine | Admitting: Family Medicine

## 2014-04-09 ENCOUNTER — Encounter: Payer: Self-pay | Admitting: Family Medicine

## 2014-04-09 ENCOUNTER — Ambulatory Visit (INDEPENDENT_AMBULATORY_CARE_PROVIDER_SITE_OTHER): Payer: No Typology Code available for payment source | Admitting: Family Medicine

## 2014-04-09 VITALS — BP 103/69 | HR 58 | Temp 98.6°F | Ht 59.0 in | Wt 199.0 lb

## 2014-04-09 DIAGNOSIS — Z113 Encounter for screening for infections with a predominantly sexual mode of transmission: Secondary | ICD-10-CM | POA: Insufficient documentation

## 2014-04-09 DIAGNOSIS — R300 Dysuria: Secondary | ICD-10-CM

## 2014-04-09 DIAGNOSIS — E118 Type 2 diabetes mellitus with unspecified complications: Secondary | ICD-10-CM

## 2014-04-09 DIAGNOSIS — N898 Other specified noninflammatory disorders of vagina: Secondary | ICD-10-CM

## 2014-04-09 DIAGNOSIS — N739 Female pelvic inflammatory disease, unspecified: Secondary | ICD-10-CM

## 2014-04-09 DIAGNOSIS — B379 Candidiasis, unspecified: Secondary | ICD-10-CM

## 2014-04-09 LAB — POCT WET PREP (WET MOUNT): Clue Cells Wet Prep Whiff POC: NEGATIVE

## 2014-04-09 LAB — POCT URINALYSIS DIPSTICK
BILIRUBIN UA: NEGATIVE
Glucose, UA: 500
Leukocytes, UA: NEGATIVE
NITRITE UA: NEGATIVE
Protein, UA: NEGATIVE
RBC UA: NEGATIVE
SPEC GRAV UA: 1.025
Urobilinogen, UA: 1
pH, UA: 6

## 2014-04-09 MED ORDER — METRONIDAZOLE 500 MG PO TABS: 500.0000 mg | ORAL_TABLET | Freq: Two times a day (BID) | ORAL | Status: DC

## 2014-04-09 MED ORDER — FLUCONAZOLE 150 MG PO TABS: 150.0000 mg | ORAL_TABLET | ORAL | Status: DC

## 2014-04-09 NOTE — Progress Notes (Signed)
Patient ID: [name], female   DOB: 1976/03/10, 38 y.o.   MRN: [id number]  [name], MD Phone: [phone]  [name] is a 39 y.o. female who presents today for f/u.  Vaginal discharge: notes that she has had discharge for the past 2 weeks. She notes she took the diflucan and the discharge went away for 1.5 days then came back. It is thick and white. She notes discomfort with sex. Itching noted. Burns with urination. Notes that she has scratched to the point of bleeding. Has increased urinary frequency, though no urgency. No fevers. LMP 12/31. U preg negative 03/27/14.  DIABETES Disease Monitoring: Blood Sugar ranges-last 2 checks were 80 and 105 fasting Polyuria/phagia/dipsia- polyuria, no polydipsia      Medications: Compliance- taking 10 u in am and 8 u in pm Hypoglycemic symptoms- once a month, eats something and this improves   Patient is a nonsmoker.   ROS: Per HPI   Physical Exam Filed Vitals:   04/09/14 1521  BP: 103/69  Pulse: 58  Temp: 98.6 F (37 C)    Gen: Well NAD Lungs: CTABL Nl WOB Heart: RRR  GU: mild irritation of inner external labia, normal vaginal mucosa, white discharge noted, no discharge from the cervical os, on bimanual exam there is cervical motion tenderness, no adnexal masses palpated   Assessment/Plan: Please see individual problem list.  [name], MD [place] PGY-3

## 2014-04-09 NOTE — Patient Instructions (Addendum)
Please take the antibiotic called flagyl. You have a yeast infection we will treat this again with diflucan.  If you do not improve please return to the office.  You need to check your blood glucose in the morning prior to eating and 2 hours after every meal. Please record this and return to our clinic in 2 weeks to follow this up.   Enfermedad inflamatoria plvica (Pelvic Inflammatory Disease)  La enfermedad inflamatoria plvica (PID) es una infeccin en algunos o todos de los rganos femeninos. La infeccin puede estar en el tero, en los ovarios, en las trompas de [place] o en los tejidos circundantes de la zona baja del vientre (pelvis).. CUIDADOS EN EL HOGAR   Si le indican antibiticos, tmelos tal como se le indic. Tmelos todos, aunque se sienta mejor.  Slo tome los medicamentos segn le indique el mdico.  No tenga sexo (relaciones sexuales) hasta completar el tratamiento o segn las indicaciones del mdico.  Comunique a su compaero sexual que sufre una PID. Su pareja podra Warden/ranger.  Cumpla con los controles mdicos segn las indicaciones. SOLICITE AYUDA DE INMEDIATO SI:   Tiene fiebre.  Siente ms dolor en el vientre (abdominal) o en el bajo vientre.  Siente escalofros.  Le duele al hacer pis [place]).  No mejora luego de 72 horas.  Observa ms lquido (secrecin) en la vagina o un lquido que no es normal.  Necesita que el mdico le recete analgsicos para Conservation officer, historic buildings.  Vomita.  No puede tomar los medicamentos.  Su pareja sufre una enfermedad de transmisin sexual (ETS). ASEGRESE DE QUE:   Comprende estas instrucciones.  Controlar su enfermedad.  Solicitar ayuda de inmediato si no mejora o si empeora. Document Released: 08/30/2011 Document Revised: 06/25/2012 [facility] Patient Information 2015 [place], [place]. This information is not intended to replace advice given to you by your health care provider. Make sure you discuss any questions  you have with your health care provider.

## 2014-04-10 LAB — URINE CYTOLOGY ANCILLARY ONLY
Chlamydia: NEGATIVE
Neisseria Gonorrhea: NEGATIVE

## 2014-04-10 NOTE — Assessment & Plan Note (Addendum)
Patient with continued discharge. Also reports prior history of dyspareunia. Had cervical motion tenderness on exam despite prior treatment with rocephin and azithro. Though has no obvious discharge from the cervical os. Had negative GC/chlamyida previously. Will check urine GC/chlamydia today. Also checked UA which was negative. Will treat with flagyl to cover for anaerobes that could be causing this issue. Discussed return precautions. F/u if not improving.  Precepted with Dr Mingo Amber

## 2014-04-10 NOTE — Assessment & Plan Note (Signed)
Had apparent partial treatment and recurrence of yeast infection. Has yeast on wet prep. Likely related to poorly controlled DM. Will treat with diflucan 2 doses 3 days apart. May need to consider terconazole if not improving with diflucan.

## 2014-04-10 NOTE — Assessment & Plan Note (Signed)
Fasting sugars per patient report seem to be well controlled though I doubt her CBGs are in a good range the rest of the day given her UA with 500 glucose. I discussed the importance of checking fasting, and 2 hr post prandial cbgs to get a better idea of her sugars. We will continue her current regimen at this time. She will follow-up in 2 weeks with a log of sugars so we can make adjustments to her regimen.

## 2014-04-14 ENCOUNTER — Encounter: Payer: Self-pay | Admitting: *Deleted

## 2014-04-14 ENCOUNTER — Telehealth: Payer: Self-pay | Admitting: *Deleted

## 2014-04-14 NOTE — Telephone Encounter (Signed)
Letter mailed to patient. Jazmin Hartsell,CMA  

## 2014-04-14 NOTE — Telephone Encounter (Signed)
-----   Message from Leone Haven, MD sent at 04/11/2014 12:11 PM EST ----- Please inform the patient that her gonorrhea and chlamydia testing were negative.

## 2014-04-17 ENCOUNTER — Telehealth: Payer: Self-pay | Admitting: Gastroenterology

## 2014-04-17 NOTE — Telephone Encounter (Signed)
Please call pt. She had HYPERPLASTIC POLYPS removed. Her stomach Bx showed H. Pylori infection. she needs to complete HER ABX FOR HER VAGINAL INFECTION AND THEN BE TREATED FOR THE STOMACH INFECTION. SHE WILL NEED TO TAKE ALL THE ABX FOR H PYLORI  BECAUSE IF LEFT UNTREATED, IT CAN LEAD TO STOMACH CANCER.  FOR NOW: 1. CONTINUE YOUR WEIGHT LOSS EFFORTS. LOSE 10 MORE LBS.  3. DRINK WATER TO KEEP YOUR URINE LIGHT YELLOW.  3. CONTINUE OMEPRAZOLE.  TAKE 30 MINUTES MEALS TWICE DAILY.  4. FOLLOW A HIGH FIBER/LOW FAT DIET. AVOID ITEMS THAT CAUSE BLOATING.   5. YOU NEED TO REPEAT YOUR COLONOSCOPY WITHIN THE NEXT MONTH.  FOLLOW UP IN JUL 2016 E30 H PYLORI GASTRITIS/OBSCURE GI BLEED.

## 2014-04-18 NOTE — Telephone Encounter (Signed)
Called pt to give results. She cannot speak [place] and had no one there at the present that could speak [place].  She will get her daughter to call me. Also, I am mailing the Results/Recommendations for daughter to review and call if she has questions.

## 2014-04-18 NOTE — Telephone Encounter (Signed)
Patient has appointment 07/2014

## 2014-04-22 ENCOUNTER — Encounter (HOSPITAL_COMMUNITY): Payer: Self-pay | Admitting: Gastroenterology

## 2014-04-22 ENCOUNTER — Ambulatory Visit (INDEPENDENT_AMBULATORY_CARE_PROVIDER_SITE_OTHER): Payer: No Typology Code available for payment source | Admitting: Family Medicine

## 2014-04-22 ENCOUNTER — Ambulatory Visit (HOSPITAL_COMMUNITY)
Admission: RE | Admit: 2014-04-22 | Discharge: 2014-04-22 | Disposition: A | Discharge status: Home / Self Care | Payer: No Typology Code available for payment source | Source: Ambulatory Visit | Attending: Family Medicine | Admitting: Family Medicine

## 2014-04-22 VITALS — BP 128/88 | HR 70 | Temp 98.7°F | Ht 59.0 in | Wt 197.0 lb

## 2014-04-22 DIAGNOSIS — E118 Type 2 diabetes mellitus with unspecified complications: Secondary | ICD-10-CM

## 2014-04-22 DIAGNOSIS — R42 Dizziness and giddiness: Secondary | ICD-10-CM

## 2014-04-22 DIAGNOSIS — IMO0001 Reserved for inherently not codable concepts without codable children: Secondary | ICD-10-CM

## 2014-04-22 DIAGNOSIS — N939 Abnormal uterine and vaginal bleeding, unspecified: Secondary | ICD-10-CM

## 2014-04-22 DIAGNOSIS — R030 Elevated blood-pressure reading, without diagnosis of hypertension: Secondary | ICD-10-CM

## 2014-04-22 DIAGNOSIS — R079 Chest pain, unspecified: Secondary | ICD-10-CM

## 2014-04-22 DIAGNOSIS — R102 Pelvic and perineal pain: Secondary | ICD-10-CM

## 2014-04-22 DIAGNOSIS — I10 Essential (primary) hypertension: Secondary | ICD-10-CM | POA: Insufficient documentation

## 2014-04-22 LAB — POCT HEMOGLOBIN: Hemoglobin: 13.3 g/dL (ref 12.2–16.2)

## 2014-04-22 LAB — POCT GLYCOSYLATED HEMOGLOBIN (HGB A1C): HEMOGLOBIN A1C: 11.4

## 2014-04-22 MED ORDER — INSULIN NPH ISOPHANE & REGULAR (70-30) 100 UNIT/ML ~~LOC~~ SUSP: 8.0000 [IU] | Freq: Two times a day (BID) | SUBCUTANEOUS | Status: DC

## 2014-04-22 NOTE — Progress Notes (Addendum)
Patient ID: Juliannah Ohmann, female   DOB: 1975/10/13, 39 y.o.   MRN: [id number]  [name], MD Phone: [phone]  Yola Paradiso is a 39 y.o. female who presents today for f/u.  DIABETES Disease Monitoring: Blood Sugar ranges-has not been checking recently related to her machine being broken, does remember range of 180-300. Polyuria/phagia/dipsia- thirstier than usual, urinating more      Medications: Compliance- taking 10 u in am and 8 u in pm Hypoglycemic symptoms- no  Pelvic pain: notes this is still present after treatment for yeast infection and PID with diflucan, CTX, azithro, and flagyl. She notes periods have been inconsistent the past several months. Notes that prior to December she bleed consistently at the same time. Starting in December she has had bleeding with periods for 2-3 weeks at a time then no bleeding between periods.  light headedness: notes she had this 2-3x/week 6 months ago and this went away. She notes having one episode in the past week. She also notes episodes of central chest pressure over the past several months with dyspnea, diaphoresis, and radiation to the left shoulder. Can come on at any time, though is associated with activity and improves with rest. She has no chest pain, light headedness, or dyspnea at this time. States was told this was her GERD in the ED. Never seen a cardiologist.    Patient is a nonsmoker.   ROS: Per HPI   Physical Exam Filed Vitals:   04/22/14 1715  BP: 128/88  Pulse:   Temp:     Gen: Well NAD HEENT: PERRL,  MMM, poor dentition Lungs: CTABL Nl WOB Heart: RRR  Abd: soft, mild suprapubic discomfort, ND Exts: Non edematous BL  LE, warm and well perfused.   EKG: NSR rate 64, no ST or T wave changes from previous tracing  Assessment/Plan: Please see individual problem list.  [name], MD [place] PGY-3

## 2014-04-22 NOTE — Patient Instructions (Signed)
Nice to see you. We are going to refer you to cardiology for your blood pressure. We will get an Korea of your uterus to look for a cause of your pain.  We are going to have you come back for blood work.  Please schedule an appointment on the way out of the office.  If you develop chest pain, trouble breathing, sweating, light headedness, or fever please seek medical attention.   Dolor de pecho (no especfico) (Chest Pain (Nonspecific)) Suele ser difcil diagnosticar la causa del dolor de [place]. Siempre hay una posibilidad de que el dolor podra estar relacionado con algo grave, como un ataque al corazn o un cogulo sanguneo en los pulmones. Debe concurrir a las visitas de control con el mdico. CUIDADOS EN EL HOGAR  Si le dieron antibiticos, tmelos como se lo haya indicado el mdico. Finalice el medicamento, aunque comience a Sports administrator.  [address] no haga actividades que provoquen dolor de [place]. Contine con las actividades fsicas como se lo haya indicado el mdico.  No use productos que contengan tabaco, que incluyen cigarrillos, tabaco para Higher education careers adviser y Psychologist, sport and exercise.  Evite el consumo de alcohol.  Tome los medicamentos solamente como se lo haya indicado el mdico.  Siga las sugerencias del mdico en lo que respecta a ms pruebas, si el dolor de pecho no desaparece.  Concurra a todas las visitas que concert con el mdico. SOLICITE AYUDA SI:  El dolor de pecho no desaparece, incluso despus del tratamiento.  Tiene una erupcin cutnea con ampollas en el pecho.  Tiene fiebre. SOLICITE AYUDA DE INMEDIATO SI:   Aumenta el dolor de pecho o el dolor se irradia hacia el brazo, el cuello, la [place], la espalda o el vientre (abdomen).  Le falta el aire.  Tose ms de lo normal o tose con sangre.  Siente un dolor muy intenso en la espalda o el vientre.  Tiene malestar estomacal (nuseas) o vomita.  Se siente muy dbil.  Pierde el  conocimiento (se desmaya).  Tiene escalofros. Esto es Engineer, maintenance (IT). No espere a ver que los problemas desaparezcan. Llame a los servicios de emergencia locales (911 en [place]). No conduzca por sus propios medios [employer] hospital. ASEGRESE DE QUE:   Comprende estas instrucciones.  Controlar su afeccin.  Recibir ayuda de inmediato si no mejora o si empeora. Document Released: 05/27/2008 Document Revised: 03/05/2013 [facility] Patient Information 2015 [place]. This information is not intended to replace advice given to you by your health care provider. Make sure you discuss any questions you have with your health care provider.

## 2014-04-23 ENCOUNTER — Other Ambulatory Visit: Payer: No Typology Code available for payment source

## 2014-04-23 LAB — CBC
HCT: 43.9 % (ref 36.0–46.0)
Hemoglobin: 14.4 g/dL (ref 12.0–15.0)
MCH: 27.1 pg (ref 26.0–34.0)
MCHC: 32.8 g/dL (ref 30.0–36.0)
MCV: 82.7 fL (ref 78.0–100.0)
MPV: 9.9 fL (ref 8.6–12.4)
PLATELETS: 190 10*3/uL (ref 150–400)
RBC: 5.31 MIL/uL — ABNORMAL HIGH (ref 3.87–5.11)
RDW: 13.8 % (ref 11.5–15.5)
WBC: 6.6 10*3/uL (ref 4.0–10.5)

## 2014-04-24 ENCOUNTER — Telehealth: Payer: Self-pay | Admitting: *Deleted

## 2014-04-24 LAB — FOLLICLE STIMULATING HORMONE: FSH: 11 m[IU]/mL

## 2014-04-24 LAB — TSH: TSH: 1.403 u[IU]/mL (ref 0.350–4.500)

## 2014-04-24 MED ORDER — INSULIN NPH ISOPHANE & REGULAR (70-30) 100 UNIT/ML ~~LOC~~ SUSP: 10.0000 [IU] | Freq: Two times a day (BID) | SUBCUTANEOUS | Status: DC

## 2014-04-24 NOTE — Telephone Encounter (Signed)
I re-faxed the orders. The initial ones were confusing so I corrected this to reflect what she is supposed to be taking. Thanks.

## 2014-04-24 NOTE — Telephone Encounter (Signed)
Received a fax from [facility] needing clarification in patient's Novolin 70/30 directions.  Please give them a call at [phone]. Derl Barrow, RN

## 2014-04-24 NOTE — Telephone Encounter (Signed)
Will forward to MD for clear instructions and then I will call health department. Isobella Ascher,CMA

## 2014-04-25 ENCOUNTER — Ambulatory Visit (INDEPENDENT_AMBULATORY_CARE_PROVIDER_SITE_OTHER): Payer: No Typology Code available for payment source | Admitting: Student

## 2014-04-25 ENCOUNTER — Encounter: Payer: Self-pay | Admitting: Family Medicine

## 2014-04-25 ENCOUNTER — Telehealth: Payer: Self-pay

## 2014-04-25 ENCOUNTER — Telehealth: Payer: Self-pay | Admitting: Clinical

## 2014-04-25 ENCOUNTER — Telehealth: Payer: Self-pay | Admitting: *Deleted

## 2014-04-25 VITALS — BP 131/97 | HR 93 | Temp 97.7°F | Wt 197.0 lb

## 2014-04-25 DIAGNOSIS — E1140 Type 2 diabetes mellitus with diabetic neuropathy, unspecified: Secondary | ICD-10-CM | POA: Insufficient documentation

## 2014-04-25 DIAGNOSIS — E1149 Type 2 diabetes mellitus with other diabetic neurological complication: Secondary | ICD-10-CM

## 2014-04-25 DIAGNOSIS — R42 Dizziness and giddiness: Secondary | ICD-10-CM

## 2014-04-25 DIAGNOSIS — R079 Chest pain, unspecified: Secondary | ICD-10-CM | POA: Insufficient documentation

## 2014-04-25 MED ORDER — GABAPENTIN 300 MG PO CAPS: 300.0000 mg | ORAL_CAPSULE | Freq: Every day | ORAL | Status: DC

## 2014-04-25 NOTE — Patient Instructions (Signed)
Neuropata diabtica (Diabetic Neuropathy) La neuropata diabtica es una enfermedad o lesin nerviosa causada por la diabetes mellitus. Alrededor de la mitad de todas las personas que sufren diabetes mellitus tienen alguna forma de lesin nerviosa. Es ms frecuente en aquellas personas que han sufrido diabetes mellitus durante muchos aos y quienes generalmente no tienen buen control del nivel de Location manager en la sangre (glucosa). La neuropata diabtica es una complicacin comn de la diabetes mellitus. Hay tres tipos ms comunes de neuropata diiabtica y un cuarto tipo que es menos frecuente y menos comprendido:   Neuropata perifrica: Es el tipo ms frecuente de neuropata diabtica. Causa lesiones en los nervios de los pies y las piernas primero y finalmente en las manos y [place].La lesin afecta la capacidad de sentir con el tacto.  Neuropata autonmica: este tipo causa lesiones en el sistema nervioso autnomo, que controla las siguientes funciones:  Los latidos cardacos.  La temperatura corporal.  La presin arterial.  La miccin.  La digestin.  La sudoracin.  La funcin sexual.  Neuropata focal: la neuropata focal puede ser dolorosa e impredecible y ocurre con ms frecuencia en adultos mayores con diabetes mellitus. Implica un nervio especfico o un rea y con frecuencia aparece repentinamente. Generalmente no causa problemas a Barrister's clerk.  Neuropata diabtica proximal: tambin llamada neuropata lumbosacra, afecta los nervios de los muslos, las caderas, las nalgas o las piernas. Es ms frecuente en personas con diabetes mellitus tipo 2 y en hombre mayores. Se caracteriza por dolor debilitante, debilidad y atrofia, generalmente en los msculos de los muslos. CAUSAS  La causa de la neuropata perifrica, autonmica y focal es la diabetes mellitus no controlada y los niveles elevados de glucosa. La causa de la neuropata diabtica proximal no se conoce. Sin embargo, se  considera que la causa es una inflamacin relacionada con los niveles no controlados de glucosa. SIGNOS Y SNTOMAS  Neuropata perifrica La neuropata perifrica se desarrolla lentamente a lo largo del tiempo. Cuando los nervios de los pies y las piernas ya no funcionan puede haber:   Merchandiser, retail, sensacin pulstil o dolor en las piernas o los pies.  Incapacidad para sentir presin o dolor en el pie. Las consecuencias son:  Callosidades gruesas en las zonas de presin.  Escaras.  lceras.  Deformidades en el pie.  Capacidad reducida para sentir los cambios de temperatura.  Debilidad muscular. Neuropata autnoma Los sntomas varan segn qu nervios estn afectados. Los sntomas pueden ser:  Problemas digestivos como:  Ganas de vomitar (nuseas).  Vmitos.  Hinchazn.  Estreimiento.  Diarrea.  Dolor abdominal.  Dificultad para orinar. Esto ocurre cuando se pierde la capacidad de sentir cuando la vejiga est llena. Los problemas incluyen:  Prdida de orina (incontinencia).  Imposibilidad de vaciar la vejiga completamente (retencin).  Latidos cardacos rpidos o irregulares (palpitaciones).  Descenso brusco de la presin arterial al ponerse de pie (hipotensin ortosttica). Al ponerse de pie puede sentir:  Bear [employer]arns.  Debilidad.  Desmayos.  En los hombres, imposibilidad de Scientist, forensic y Copy.  En las mujeres, sequedad vaginal y problemas de disminucin del deseo sexual y Programmer, systems.  Problemas con la regulacin de la Firefighter.  Aumento o disminucin de la sudoracin. Neuropata focal  Movimientos oculares anormales o alineacin ocular anormal de ambos ojos.  Debilidad en la mueca.  Pie cado. Esto da como resultado una incapacidad para levantar el pie adecuadamente y Marlyce Huge o movimientos del pie anormales.  Parlisis de un lado del rostro (parlisis de [place]).  Dolor en el pecho o dolor abdominal. Neuropata radicular  plexual  Dolor repentino e intenso en la cadera, el muslo o las nalgas.  Debilidad y desgaste de los msculos del muslo.  Dificultad para levantarse desde una posicin de sentado.  Hinchazn abdominal.  Prdida de peso sin motivo (generalmente ms de 10 lb [4,5 kg]). DIAGNSTICO  Neuropata perifrica Le controlarn los sentidos. Las pruebas de funcin sensorial pueden realizarse con:  Un ligero toque usando un monofilamento.  La vibracin con un diapasn.  Sensacin aguda con el pinchazo de Warehouse manager. Otras pruebas que ayudan a diagnosticar la neuropata son:  Velocidad de conduccin nerviosa. Este estudio controla la transmisin de la corriente Art therapist a travs del nervio.  Electromiograma. Muestra de qu modo los msculos responden a las seales elctricas transmitidas por los nervios que los rodean.  Prueba de sensibilidad cuantitativa. Se utiliza para evaluar de que [employer] nervios responden a las vibraciones y cambios de Scientist, forensic. Neuropata autnoma El diagnstico se basa en los sntomas informados. infrmele al mdico o al profesional de la salud si siente:   Bear Stearns.   Estreimiento.   Diarrea.   Dificultad o imposibilidad de Garment/textile technologist.   Imposibilidad de lograr o Copy.  Podrn solicitarle otros estudios, por ejemplo:   Electrocardiograma o monitoreo Holter. Estos estudios muestran si hay problemas con la frecuencia y el ritmo cardacos.   Le harn radiografas. Neuropata focal El diagnstico se basa en los sntomas y en lo que el mdico encuentre durante el examen. Podrn solicitarle otros estudios. Pueden incluir:  Velocidad de conduccin nerviosa. Este estudio controla la transmisin de la corriente Art therapist a travs del nervio.  Electromiograma. Muestra de qu modo los msculos responden a las seales elctricas transmitidas por los nervios que los rodean.  Prueba de sensibilidad cuantitativa. Se utiliza para evaluar de que  [employer] nervios responden a las vibraciones y cambios de Scientist, forensic. Neuropataradicular plexual  Generalmente lo primero es Civil engineer, contracting otro motivo o problemas que pudieran ser la causa, ya que no hay exmenes para realizar este diagnstico.  Radiografa de la columna vertebral y la regin lumbar.  Puncin lumbar para descartar cncer.  Resonancia magntica para Freight forwarder lesiones. TRATAMIENTO  Una vez que se produce una lesin nerviosa, no puede ser revertida. El objetivo del tratamiento es impedir que la enfermedad o la lesin nerviosa empeoren y afecten ms nervios. Controlar el nivel de glucosa en la sangre es la clave. La [employer] de las personas con neuropata radicular plexual ven al menos una mejora parcial con el tiempo. Deber mantener su nivel de glucosa en sangre y el nivel HbA1c en los valores determinados por su mdico. Los factores que ayudan a Chief Technology Officer el nivel de glucosa en sangre son:   Control del nivel de Multimedia programmer.   Planificacin de los alimentos.   Actividad fsica.   Medicamentos para la diabetes.  Con el tiempo, el mantener [employer] niveles de glucosa en la sangre ayuda a [employer] sntomas. En algunos casos, se indican analgsicos. INSTRUCCIONES PARA EL CUIDADO EN EL HOGAR:  No fume.  Mantenga su nivel de glucosa en sangre en el rango en el que usted y su mdico han determinado que es aceptable para usted.  Mantenga su nivel de glucosa en la sangre en el rango en el que usted y su mdico han determinado que es aceptable para usted.  Consuma una dieta bien balanceada.  Sea fsicamente [employer]itor.  Revise sus pies todos  los das. SOLICITE ATENCIN MDICA SI:   Siente ardor, sensacin pulstil o dolor en las piernas o los pies.  Tiene incapacidad para sentir presin o dolor en el pie.  Aparecen problemas digestivos como:  Nuseas.  Vmitos.  Hinchazn.  Estreimiento.  Diarrea.  Dolor  abdominal.  Tiene dificultad para orinar como:  incontinencia.  Retencin.  Usted tiene palpitaciones.  Presenta hipotensin ortosttica. Al ponerse de pie puede sentir:  Bear Stearns.  Debilidad.  Desmayarse  No puede lograr tener ni mantener una ereccin (en los hombres).  En las mujeres, sequedad vaginal y problemas de disminucin del deseo sexual y Programmer, systems.  Siente un dolor [employer], las piernas o las nalgas.  Pierde peso de [place] inexplicable. Document Released: 02/28/2005 Document Revised: 10/31/2012 [facility] Patient Information 2015 [place]. This information is not intended to replace advice given to you by your health care provider. Make sure you discuss any questions you have with your health care provider.

## 2014-04-25 NOTE — Assessment & Plan Note (Addendum)
Patient with typical sounding chest pain. Has GM as risk factor. EKG with no ischemic findings. No chest pain or dyspnea or light headedness at this time. Will start on ASA 81 mg. Does have history of minimal bleeding from apparent internal hemorrhoids seen on colonoscopy and gastritis related to H pylori, though with stable hgb from this in combination with typical nature of CP I think the benefits of a low dose aspirin outweigh the current risk of bleed given no large source of bleeding seen on scope. Will advise patient to d/c aspirin if develops bleeding or abdominal pain. Will refer to cardiology for evaluation and consideration of stress testing and echo. Given return precautions.

## 2014-04-25 NOTE — Telephone Encounter (Signed)
I called interpreter and spoke to [facility] [phone]). She said the phone had many rings and no answer and I told her we will try again on Mon, we close at noon today.

## 2014-04-25 NOTE — Assessment & Plan Note (Addendum)
Patient with pelvic pain despite treatment for PID. Given lack of improvement in symptoms with antibiotics I doubt this is related to PID at this time. With abnormal periods suspect this would be related to structural issue such as fibroids. Will obtain vaginal Korea to evaluate uterus. Given patient age she will need endometrial biopsy as well. She will return to clinic for this following her [place]. POC hgb in normal range. Will check CBC and TSH.   Precepted with Dr Ree Kida

## 2014-04-25 NOTE — Assessment & Plan Note (Addendum)
Not at goal. A1c 11.4. Will increase am insulin to 11 u and pm insulin to 10 u. Will send in Rx for new glucometer to HD. F/u in one month.

## 2014-04-25 NOTE — Telephone Encounter (Signed)
[place] interpreter [name] ID # [id number].  Pt is aware of this.  States that she used to take this medication but was told a few years ago while at ED to stop it.  She will restart this medication.  Also she has been experiencing dizziness in the morning and right leg pain that she has to rest before getting up.  Pt has an appt for this afternoon on same day.  Will also have her cards appt ready for this visit. Jazmin Hartsell,CMA

## 2014-04-25 NOTE — Telephone Encounter (Signed)
CSW spoke with pt and assisted in explaining what appointments she has.  Pt is scheduled to see [facility] on 05/07/14 at 1pm. She also has an appointment for [place] on 05/09/14 at 2:10pm.  Pt aware and agreeable of appointments.  Hunt Oris, MSW, [place]

## 2014-04-25 NOTE — Telephone Encounter (Signed)
-----   Message from Leone Haven, MD sent at 04/25/2014 10:58 AM EST ----- Sorry, when you call the above patient about the aspirin could you please advise her that if she notices blood in her stool or vomits blood or has abdominal pain she will need to stop the aspirin and let us know. Thanks.  Randall Hiss

## 2014-04-25 NOTE — Telephone Encounter (Signed)
T/C from Hunt Oris, Education officer, museum, said pt said she received two prescriptions in the mail and they were wet, she did not know who they were from and she could not read them.  I told Constance Holster that the prescriptions Dr. Oneida Alar give her were sent in electronically and receipt confirmed.  She asked me to call the pt via an interpreter and let her know which meds she needs to be taking.

## 2014-04-25 NOTE — Progress Notes (Signed)
   Subjective:    Patient ID: [name], female    DOB: 1975/08/18, 39 y.o.   MRN: [id number]  CC: Right leg pain  HPI  39 y/o female who presents for the above complaint. She was recently seen on 2/9 for lightheadedness, resolving pelvic pain and diabetes monitoring. Hgb A1c was 11.4 at that time. Her Novolin insulin was increased to 11 in the AM and 10 at night.  She endorsed exertional chest pain and had a normal EKG at that time. Referral to cardiology was made.  Since her last visit she reports that she has had bilateral lower extremity pain that started 2/9. This pain starts as numbness in her feet and radiates to her calves as sharp pain. She had two episodes of acute pain with baseline bilateral leg soreness. She denies swelling, redness, or warmth. This pain occurs mostly with laying down and is not associated with walking or exercise. Additionally she reports that she has not increased her Novolin as directed on 2/9. She has been taking 10 u in the AM and 8 at night. She was directed to take 11 u in the AM and 10 at night   Review of Systems   See HPI for ROS. All other systems reviewed and are negative.  Past medical history, surgical, family, and social history reviewed and updated in the EMR as appropriate. Objective:  BP 131/97 mmHg  Pulse 93  Temp(Src) 97.7 F (36.5 C) (Oral)  Wt 197 lb (89.359 kg)  LMP 04/16/2014 (Exact Date) Vitals and nursing note reviewed  General: NAD Cardiac: RRR, normal heart sounds, no murmurs. Respiratory: CTAB, normal effort Abdomen: soft, nontender, nondistended, + BS Extremities: no edema, warmth or erythema. 2+ pedal pulses Skin: warm and dry, no rashes noted Neuro: alert and oriented, no focal deficits  Random blood glucose: 334   Assessment & Plan:  See Problem List Documentation  39 y/o with poorly controlled DM and bilateral LE pain,  LE pain: New onset pain concerning for diabetic neuropathy. Differential includes  DVT, injury. Given her history and exam findings, DVT is less likely. She does not have history of trauma. Her diabetes has been sub optimally controlled with elevated Hgb A1c since 2013. Given her longstanding diabetes with poor control she is likely suffering from DM neuropathy. Will start on gabapentin 300 mg qHS to increase to effect. DM: She was again encouraged to take her Novolin pen as instructed and monitor her blood glucose.  RTC in 1 month to review blood glucose control   Jeiden Daughtridge A. Lincoln Brigham MD, [facility] Resident PGY-1 Pager [phone]

## 2014-04-26 ENCOUNTER — Other Ambulatory Visit: Payer: Self-pay

## 2014-04-26 ENCOUNTER — Emergency Department (HOSPITAL_COMMUNITY)
Admission: EM | Admit: 2014-04-26 | Discharge: 2014-04-26 | Disposition: A | Discharge status: Home / Self Care | Payer: No Typology Code available for payment source | Attending: Emergency Medicine | Admitting: Emergency Medicine

## 2014-04-26 ENCOUNTER — Encounter (HOSPITAL_COMMUNITY): Payer: Self-pay | Admitting: *Deleted

## 2014-04-26 DIAGNOSIS — E1165 Type 2 diabetes mellitus with hyperglycemia: Secondary | ICD-10-CM | POA: Insufficient documentation

## 2014-04-26 DIAGNOSIS — Z792 Long term (current) use of antibiotics: Secondary | ICD-10-CM | POA: Insufficient documentation

## 2014-04-26 DIAGNOSIS — F329 Major depressive disorder, single episode, unspecified: Secondary | ICD-10-CM | POA: Insufficient documentation

## 2014-04-26 DIAGNOSIS — I10 Essential (primary) hypertension: Secondary | ICD-10-CM | POA: Insufficient documentation

## 2014-04-26 DIAGNOSIS — K219 Gastro-esophageal reflux disease without esophagitis: Secondary | ICD-10-CM | POA: Insufficient documentation

## 2014-04-26 DIAGNOSIS — Z79899 Other long term (current) drug therapy: Secondary | ICD-10-CM | POA: Insufficient documentation

## 2014-04-26 DIAGNOSIS — Z7982 Long term (current) use of aspirin: Secondary | ICD-10-CM | POA: Insufficient documentation

## 2014-04-26 DIAGNOSIS — R739 Hyperglycemia, unspecified: Secondary | ICD-10-CM

## 2014-04-26 LAB — COMPREHENSIVE METABOLIC PANEL
ALBUMIN: 3.7 g/dL (ref 3.5–5.2)
ALT: 26 U/L (ref 0–35)
AST: 24 U/L (ref 0–37)
Alkaline Phosphatase: 104 U/L (ref 39–117)
Anion gap: 10 (ref 5–15)
BILIRUBIN TOTAL: 0.8 mg/dL (ref 0.3–1.2)
BUN: 12 mg/dL (ref 6–23)
CO2: 24 mmol/L (ref 19–32)
Calcium: 9.1 mg/dL (ref 8.4–10.5)
Chloride: 101 mmol/L (ref 96–112)
Creatinine, Ser: 0.51 mg/dL (ref 0.50–1.10)
GFR calc non Af Amer: >90 mL/min (ref >90)
Glucose, Bld: 281 mg/dL — ABNORMAL HIGH (ref 70–99)
Potassium: 3.7 mmol/L (ref 3.5–5.1)
Sodium: 135 mmol/L (ref 135–145)
Total Protein: 7.1 g/dL (ref 6.0–8.3)

## 2014-04-26 LAB — I-STAT VENOUS BLOOD GAS, ED
BICARBONATE: 25.3 meq/L — AB (ref 20.0–24.0)
O2 Saturation: 27 %
PCO2 VEN: 42.9 mmHg — AB (ref 45.0–50.0)
PO2 VEN: 19 mmHg — AB (ref 30.0–45.0)
TCO2: 27 mmol/L (ref 0–100)
pH, Ven: 7.379 — ABNORMAL HIGH (ref 7.250–7.300)

## 2014-04-26 LAB — CBC
HCT: 42.2 % (ref 36.0–46.0)
Hemoglobin: 14.3 g/dL (ref 12.0–15.0)
MCH: 26.7 pg (ref 26.0–34.0)
MCHC: 33.9 g/dL (ref 30.0–36.0)
MCV: 78.9 fL (ref 78.0–100.0)
PLATELETS: 190 10*3/uL (ref 150–400)
RBC: 5.35 MIL/uL — ABNORMAL HIGH (ref 3.87–5.11)
RDW: 13.3 % (ref 11.5–15.5)
WBC: 7.7 10*3/uL (ref 4.0–10.5)

## 2014-04-26 LAB — URINALYSIS, ROUTINE W REFLEX MICROSCOPIC
BILIRUBIN URINE: NEGATIVE
Glucose, UA: >1000 mg/dL — AB
Ketones, ur: 15 mg/dL — AB
LEUKOCYTES UA: NEGATIVE
Nitrite: NEGATIVE
Protein, ur: NEGATIVE mg/dL
Specific Gravity, Urine: 1.036 — ABNORMAL HIGH (ref 1.005–1.030)
UROBILINOGEN UA: 0.2 mg/dL (ref 0.0–1.0)
pH: 6 (ref 5.0–8.0)

## 2014-04-26 LAB — CBG MONITORING, ED
GLUCOSE-CAPILLARY: 277 mg/dL — AB (ref 70–99)
Glucose-Capillary: 287 mg/dL — ABNORMAL HIGH (ref 70–99)

## 2014-04-26 LAB — URINE MICROSCOPIC-ADD ON

## 2014-04-26 MED ORDER — KETOROLAC TROMETHAMINE 30 MG/ML IJ SOLN
15.0000 mg | Freq: Once | INTRAMUSCULAR | Status: AC
  Administered 2014-04-26: 15 mg via INTRAVENOUS
  Filled 2014-04-26: qty 1

## 2014-04-26 MED ORDER — ONDANSETRON HCL 4 MG/2ML IJ SOLN
4.0000 mg | Freq: Once | INTRAMUSCULAR | Status: AC
  Administered 2014-04-26: 4 mg via INTRAVENOUS
  Filled 2014-04-26: qty 2

## 2014-04-26 MED ORDER — KETOROLAC TROMETHAMINE 30 MG/ML IJ SOLN: 30.0000 mg | Freq: Once | INTRAMUSCULAR | Status: DC

## 2014-04-26 MED ORDER — SODIUM CHLORIDE 0.9 % IV BOLUS (SEPSIS)
1000.0000 mL | Freq: Once | INTRAVENOUS | Status: AC
  Administered 2014-04-26: 1000 mL via INTRAVENOUS

## 2014-04-26 NOTE — Discharge Instructions (Signed)
°Emergency Department Resource Guide °1) Find a Doctor and Pay Out of Pocket °Although you won't have to find out who is covered by your insurance plan, it is a good idea to ask around and get recommendations. You will then need to call the office and see if the doctor you have chosen will accept you as a new patient and what types of options they offer for patients who are self-pay. Some doctors offer discounts or will set up payment plans for their patients who do not have insurance, but you will need to ask so you aren't surprised when you get to your appointment. ° °2) Contact Your Local Health Department °Not all health departments have doctors that can see patients for sick visits, but many do, so it is worth a call to see if yours does. If you don't know where your local health department is, you can check in your phone book. The CDC also has a tool to help you locate your state's health department, and many state websites also have listings of all of their local health departments. ° °3) Find a Walk-in Clinic °If your illness is not likely to be very severe or complicated, you may want to try a walk in clinic. These are popping up all over the country in pharmacies, drugstores, and shopping centers. They're usually staffed by nurse practitioners or physician assistants that have been trained to treat common illnesses and complaints. They're usually fairly quick and inexpensive. However, if you have serious medical issues or chronic medical problems, these are probably not your best option. ° °No Primary Care Doctor: °- Call [facility] at  [phone] - they can help you locate a primary care doctor that  accepts your insurance, provides certain services, etc. °- Physician Referral Service- 1-800-533-3463 ° °Chronic Pain Problems: °Organization         Address  Phone   Notes  °[facility]  ([phone]) [phone] Patients need to be referred by their primary care doctor.  ° °Medication  Assistance: °Organization         Address  Phone   Notes  °[place] [facility] [address] °[place], [place] [address] (336) 641-8030 --Must be a resident of [place] °-- Must have NO insurance coverage whatsoever (no Medicaid/ Medicare, etc.) °-- The pt. MUST have a primary care doctor that directs their care regularly and follows them in the community °  °[facility]  (866) 331-1348   °[employer]  (888) 892-1162   ° °Agencies that provide inexpensive medical care: °Organization         Address  Phone   Notes  °[facility]  (336) 832-8035   °[facility]    (336) 832-7272   °[facility] [address] °[place], [place] [address] (336) 832-4777   °[facility] [address], °[place] (336) 271-4999   °[facility]    (336) 373-0678   °[facility]    (336) 272-1050   °[facility] ° [address], [place] Phone:  (336) 832-4444, Fax:  (336) [phone] Hours of Operation:  9 am - 6 pm, M-F.  Also accepts Medicaid/Medicare and self-pay.  °[facility] ° [address], [place] Phone: (336) [phone], Fax: ([phone]) [phone]. Hours of Operation:  8:30 am - 5:30 pm, M-F.  Also accepts Medicaid and self-pay.  °[facility] [place] [address], [place] Phone: (336) [phone]   °[facility] [address], [place], [place] (336)723-1848, Ext. 123 Mondays & Thursdays: 7-9 AM.  First 15 patients are seen on a first come, first serve basis. °  ° °Medicaid-accepting [place] Providers: ° °Organization         Address  Phone   Notes  °[facility] [address], [place] ([phone]) [phone] Also accepts self-pay patients.  °[facility] [address], [place] ° (336) [phone]   °[facility] [address], [place]  (336) 288-8857   °[facility] [address] [place] [address], [place] (336) 299-7000   °Veita Bland [address], [place]  ° (336) [phone] Only accepts [place] Access Medicaid patients after they have their name applied to their card.  ° °Self-Pay (no insurance) in [place]: ° °Organization         Address  Phone   Notes  °Sickle Cell Patients, [facility] [address], [place] (336) 832-1970   °[facility] [address], [place] (336) 832-4400   °[facility] ° [address], [place] (336) 992-4800   °[facility]/Dr. Osei-Bonsu ° [address] [place] [address], [place] or [address], [place] ([phone]) [phone] Phone number for both [place] and [place] locations is the same.  °[facility] [address], [place] (336) [phone]   °[facility] [address] [place] [address], [place] or [address] (336) 852-7530 °(336) 878-2260   °[facility] [address], [place] (336) 350-1642, phone; (336) 294-5005, fax Sees patients 1st and 3rd Saturday of every month.  Must not qualify for public or private insurance (i.e. Medicaid, Medicare, [place] Health Choice, Veterans' Benefits) • Household income should be no more than 200% of the poverty level •The clinic cannot treat you if you are pregnant or think you are pregnant • Sexually transmitted diseases are not treated at the clinic.  ° ° °Dental Care: °Organization         Address  Phone  Notes  °[place] [facility] [address], [place] (336) 641-6152 Accepts children up to age 21 who are enrolled in Medicaid or [place] Health Choice; pregnant women with a Medicaid card; and children who have applied for Medicaid or [place] Health Choice, but were declined, whose parents can pay a reduced fee at time of service.  °[place]  [facility] [place]  [address], [place] (336) 641-7733 Accepts children up to age 21 who are enrolled in Medicaid or [place] Health Choice; pregnant women with a Medicaid card; and children who have applied for Medicaid or [place] Health Choice, but were declined, whose parents can pay a reduced fee at time of service.  °[facility] ° [address], [place] (336) [phone] Patients are seen by appointment only. Walk-ins are not accepted. [facility] will see patients 18 years of age and older. °Monday - Tuesday (8am-5pm) °Most Wednesdays (8:30-5pm) °$30 per visit, cash only  °[facility] ° [address], [place] ([phone]) [phone] Patients are seen by appointment only. Walk-ins are not accepted. [facility] will see patients 18 years of age and older. °One   Wednesday Evening (Monthly: Volunteer Based).  $30 per visit, cash only  °[facility]  ([phone]) [phone] for adults; Children under age 4, call Graduate Pediatric Dentistry at ([phone]) [phone]. Children aged 4-14, please call ([phone]) [phone] to request a pediatric application. ° Dental services are provided in all areas of dental care including fillings, crowns and bridges, complete and partial dentures, implants, gum treatment, root canals, and extractions. Preventive care is also provided. Treatment is provided to both adults and children. °Patients are selected via a lottery and there is often a waiting list. °  °[facility] [address], °[place] ° (336) 763-8833 [email or web address] °  °[facility] [address], [place], [place] (336)723-1848, Ext. 123 Second and Fourth Thursday of each month, opens at 6:30 AM; Clinic ends at 9 AM.  Patients are seen on a first-come first-served basis, and a limited number are seen during each clinic.  ° °[facility] ° [address], [place], [place] (336) [phone]    Eligibility Requirements °You must have lived in [place], [place], or [place] counties for at least the last three months. °  You cannot be eligible for state or federal sponsored healthcare insurance, including Veterans Administration, Medicaid, or Medicare. °  You generally cannot be eligible for healthcare insurance through your employer.  °  How to apply: °Eligibility screenings are held every Tuesday and Wednesday afternoon from 1:00 pm until 4:00 pm. You do not need an appointment for the interview!  °[facility] [address], [place], [place] [phone]   °[facility]  336-342-8273   °[facility]  336-703-3100   °[facility]  336-570-6415   ° °Behavioral Health Resources in the Community: °Intensive Outpatient Programs °Organization         Address  Phone  Notes  °[facility] [address], [place], [place] 336-878-6098   °[facility] [address], [place], [place] [phone]   °[facility] [address], [place], [place] ° 336-882-2125   °[facility] [address],  °[place], [place] 1-800-853-5163 or 336-641-4981   °Substance Abuse Resources °Organization         Address  Phone  Notes  °[facility]  336-882-2125   °[facility]  336-784-9470   °[facility]  336-285-9073   °[facility]  336-845-3988   °Residential & Outpatient Substance Abuse Program  1-800-659-3381   °Psychological Services °Organization         Address  Phone  Notes  °[facility]  336- 832-9600   °[facility]  336- 378-7881   °[facility] [address], [place] 1-800-853-5163 or 336-641-4981   ° °Mobile Crisis Teams °Organization         Address  Phone  Notes  °[facility], Mobile Crisis Care Unit  1-877-626-1772   °Assertive °[facility] ° [address]  [place], [place] 336-834-9664   °Sharon DeEsch [address] °[place] 336-554-5454   ° °Self-Help/Support Groups °Organization         Address  Phone             Notes  °[facility]  - variety of support groups  336- 373-1402 Call for more information  °Narcotics Anonymous (NA), [facility] [address], °[place]   2 meetings at this location  ° °  Residential Treatment Programs Organization         Address  Phone  Notes  [facility] [address],    [place]  1-805-579-5431   [facility]  [address], [place] [address], [place], [place]   [place], [place] [phone] Admissions: 8am-3pm M-F  [facility]stance [place] [address],    [place], [place] [phone]   [facility] [address] [place], [place], [place]   [facility] [address]  [place], [place]   [facility] Intensive Outpatient [address], [place], [place]   [facility]  [place], [place] 1-450-583-1403 or [phone]   [facility] [address], [place], [place] Accepts Medicaid  [facility] [address],  [place] 1-(308)542-7692 Substance Abuse/Addiction Treatment   [facility]         Address  Phone  Notes  [facility]  [phone]   Domenic Schwab, PhD [address] Arlis Port[place], [place]   [phone] or [phone]   [facility] [place], [place] [phone]   [facility] [address], [place], [place] [phone] Insurance/Medicaid/sponsorship through [facility] [address] [place]                                    [place], [place] [phone] [place] [address], [place] [phone]    Dr. Adele Schilder  [phone]   [facility] Dept. 1) [address] [place] 2) [place] 3)  [address], [place] [phone]  [phone]   [place] [phone] or [phone] (After Hours)        Control del nivel de glucosa en la sangre (Blood Glucose Monitoring) El control de la glucosa en la sangre (tambin llamada azcar en la sangre) lo ayudar a tener la diabetes bajo control. Tambin ayuda a que usted y Chief Financial Officer la diabetes y determinen si el tratamiento es Armed forces logistics/support/administrative officer. POR QU HAY QUE CONTROLAR LA GLUCOSA EN LA SANGRE?  Esto puede ayudar a comprender de [employer], la actividad fsica y los medicamentos inciden en los niveles de [place].  Le permite conocer el nivel de glucosa en la sangre en cualquier momento dado. Puede saber rpidamente si el nivel es bajo (hipoglucemia) o alto (hiperglucemia).  Puede ser de ayuda para que usted y el mdico sepan cmo Water engineer,  y para entender cmo controlar una enfermedad o ajustar los medicamentos para hacer ejercicio. CUNDO DEBE HACERSE LAS PRUEBAS? El mdico lo ayudar a decidir con qu frecuencia deber [employer] niveles de glucosa en la [place]. Esto puede depender del tipo de diabetes que tenga, su control de la diabetes o los tipos de medicamentos que tome. Asegrese de anotar todos los valores de la glucosa en la [place], de modo que esta informacin pueda ser revisada por su mdico. A continuacin puede ver ejemplos de los momentos para Optometrist la prueba que el mdico puede Event organiser. Diabetes tipo1  [employer]  la prueba 4 veces por da si est bien controlado, [place] una bomba de [place] o se aplica muchas inyecciones diarias.  Si la diabetes no est bien controlada o si est enfermo, puede ser  necesario que se controle con ms frecuencia.  Es recomendable que tambin se controle de [place] modo:  Antes y despus de hacer ejercicio.  [place] comidas y 2horas despus de Scientist, research (physical sciences).  Ocasionalmente, entre las 2:00a.m. y las 3:00a.m. Diabetes tipo2  Puede ser diferente para cada persona, pero, en general, si recibe insulina, hgase la prueba 4veces por da.  Si toma medicamentos por boca (va oral), hgase la prueba 2veces por da.  Si sigue una dieta controlada, hgase la prueba una vez por da.  Si la diabetes no est bien controlada o si est enfermo, puede ser necesario que se controle con ms frecuencia. CMO CONTROLAR EL NIVEL DE GLUCOSA EN LA SANGRE Insumos necesarios  Medidor de glucosa en la sangre.  Tiras reactivas para el medidor. Cada medidor tiene sus propias tiras reactivas. [place] tiras reactivas correspondientes a su medidor.  Una aguja para pinchar (lanceta).  Un dispositivo que sujeta la lanceta (dispositivo de puncin).  Un diario o libro de anotaciones para [employer]. Procedimiento  Lave sus manos con agua y [place]. No se recomienda usar alcohol.  Pnchese el costado del dedo (no la punta) con Retail buyer.  Apriete suavemente el dedo hasta que aparezca una pequea gota de [place].  Siga las instrucciones que vienen con el medidor para Garment/textile technologist tira Comptroller, Midwife la sangre sobre la tira y usar el medidor de Printmaker. Otras zonas de las que se puede tomar sangre para la prueba Algunos medidores le permiten tomar sangre para la prueba de otras zonas del cuerpo (que no son el dedo). Estas reas se llaman sitios alternativos. Los sitios alternativos ms comunes son los siguientes:  El Management consultant.  El muslo.  La zona posterior de la parte inferior de la pierna.  La palma de la mano. El flujo de sangre en estas zonas es ms lento. Por lo tanto, los valores de glucosa en la sangre que obtenga pueden estar  demorados, y los nmeros son diferentes de los que obtiene de los dedos. No saque sangre de sitios alternativos si cree que tiene hipoglucemia. Los valores no sern precisos. Siempre extraiga del dedo si tiene hipoglucemia. Adems, si no puede darse cuenta cuando tiene bajos los niveles (hipoglucemia asintomtica), siempre extraiga sangre de los dedos para los controles de glucosa en la [place]. CONSEJOS ADICIONALES PARA EL CONTROL DE LA GLUCOSA  No vuelva a [place] lancetas.  Siempre tenga los insumos a mano.  Todos los medidores de glucosa incluyen un nmero de telfono "directo", disponible las 24 horas, al que podr llamar si tiene preguntas o [place].  Ajuste (calibre) el medidor de glucosa con una solucin de control despus de terminar algunas cajas de tiras reactivas. LLEVE REGISTROS DE LOS NIVELES DE GLUCOSA EN LA SANGRE Es recomendable llevar un diario o un registro de los valores de glucosa en la [place]. La [employer] de los medidores de glucosa, sino todos, conservan el registro de la glucosa en el dispositivo. Algunos medidores permiten descargar los registros a su computadora. Llevar un registro de los valores de glucosa en la sangre es especialmente til si desea observar los patrones. Haga anotaciones simultneas con la Teacher, English as a foreign language de los valores de glucosa en la sangre debido a que podra olvidar lo que ocurri en  el momento exacto. Llevar un buen registro los ayudar a usted y al mdico a Fish farm manager juntos para Scientist, forensic un buen control de la diabetes.  Document Released: 02/28/2005 Document Revised: 07/15/2013 [facility] Patient Information 2015 [place], [place]. This information is not intended to replace advice given to you by your health care provider. Make sure you discuss any questions you have with your health care provider.  Hiperglucemia (Hyperglycemia) La hiperglucemia ocurre cuando la glucosa (azcar) en su sangre est demasiado elevada. Puede suceder por varias razones,  pero a menudo ocurre en personas que no saben que tienen diabetes o no la controlan adecuadamente.  CAUSAS Tanto si tiene diabetes como si no, existen otras causas para la hiperglucemia. La hiperglucemia puede producirse cuando tiene diabetes, pero tambin puede presentarse en otras situaciones de las que podra no ser consciente, como por ejemplo: Diabetes  Si tiene diabetes y tiene problemas para controlar su glucosa en sangre, la hiperglucemia podra producirse debido a las siguientes razones:  No seguir Armed forces technical officer.  No tomar los medicamentos para la diabetes o tomarlos de forma inadecuada.  Realizar menos ejercicio del que normalmente hace.  Estar enfermo. Prediabetes  Esto no puede ignorarse. Antes de que la persona presente diabetes de tipo 2, casi siempre hay "prediabetes". Esto ocurre cuando su glucosa en sangre es mayor que lo normal, pero no lo suficiente como para diagnosticar diabetes. La investigacin ha demostrado que algunos daos al cuerpo de Barrister's clerk, en especial los del corazn y el sistema circulatorio, podran haber ocurrido durante el periodo de prediabetes. Si controla la glucosa en sangre cuando tiene prediabetes, podr retardar o evitar que se desarrrolle la diabetes tipo 2. El estrs  Si tiene diabetes, deber hacer una dieta, tomar medicamentos orales o insulina para [place]. Sin embargo, Pension scheme manager que la glucosa en sangre es mayor que lo normal en el hospital tenga o no diabetes. Cientficamente se lo denomina "hiperglucemia por estrs". El estrs puede elevar su glucosa en sangre. Esto ocurre porque el organismo genera hormonas en los momentos de estrs. Si el estrs ha Avery Dennison causa del alto nivel de glucosa en [place], [place] mdico podr Optometrist un seguimiento de [place] regular. Ninfa [place], podr asegurarse de que la hiperglucemia no empeora o progresa hacia diabetes. Esteroides  Los esteroides son medicamentos que actan en la infeccin que  ataca al sistema inmunolgico para bloquear la inflamacin o la infeccin. Un efecto secundario puede ser el aumento de glucosa en [place]. Muchas personas pueden producir la suficiente insulina extra para este aumento, pero aquellos que no pueden, los esteroides pueden [employer] Stanley niveles sean an [place]. No es inusual que los tratamientos con esteorides "destapen" una diabetes que se est desarrollando. No siempre es posible determinar si la hiperglucemia desaparecer una vez que se detenga el consumo de esteroides. A veces se realiza un anlisis de sangre especial denominado A1c para determinar si la glucosa en sangre se ha elevado antes de comenzar con el consumo de esteroides. SNTOMAS  Sed.  Necesidad frecuente de Garment/textile technologist.  [employer].  Visin borrosa.  Cansancio o fatiga.  Debilidad.  Somnolencia.  Hormigueo en el pie o pierna. DIAGNSTICO El diagnstico se realiza mediante el control de la glucosa en sangre de una o varias de las siguientes maneras:  Anlisis A1c. Es una sustancia qumica que se encuentra en la [place].  Control de glucosa en sangre con tiras de prueba.  Resultados de laboratorio. TRATAMIENTO Primero, es importante conocer la causa de la hiperglucemia  antes de tratarla. El tratamiento puede ser el siguiente, [place] pueden ser otros:  Educacin  Cambios o ajustes en los medicamentos.  Cambios o ajustes en el plan de alimentacin.  Tratamiento por enfermedades, infecciones, etc.  Control de glucosa en sangre ms frecuente.  Cambios en el plan de ejercicios.  Disminucin o interrupcin del consumo de esteroides.  Cambios en el estilo de vida. INSTRUCCIONES PARA EL CUIDADO DOMICILIARIO  Contrlese la glucosa en sangre, como se lo indicaron.  Haga ejercicios regularmente. El profesional que lo asiste le dar instrucciones relacionadas con el ejercicio fsico. La prediabetes que es consecuencia de situaciones de estrs, puede mejorar con la actividad  fsica.  Consuma alimentos saludables y balanceados. Coma a menudo y de [place] regular, en momentos fijos. El profesional o el nutricionista le dar una dieta especial para controlar su ingestin de azcar.  Mantener su peso ideal es importante. Si lo necesita, perder un poco de peso, como 5  7 Kg. puede ser beneficioso para [employer] niveles de Museum/gallery exhibitions officer. SOLICITE ATENCIN MDICA SI:  Tiene preguntas relacionadas con los medicamentos, la actividad o la dieta.  Contina teniendo sntomas (como mucha sed, deseos intensos de Garment/textile technologist o aumento de peso) SOLICITE ATENCIN MDICA DE INMEDIATO SI:  Vomita o tiene diarrea.  Su respiracin huele frutal.  La frecuencia respiratoria es ms rpida o ms lenta.  Est somnoliento o incoherente.  Siente adormecimiento, hormigueos o Engineer, agricultural o en las manos.  Siente dolor en el pecho.  Sus sntomas empeoran aunque haya seguido las indicaciones de su mdico.  Tiene otras preguntas o preocupaciones. Document Released: 02/28/2005 Document Revised: 05/23/2011 [facility] Patient Information 2015 [place]. This information is not intended to replace advice given to you by your health care provider. Make sure you discuss any questions you have with your health care provider.

## 2014-04-26 NOTE — ED Notes (Addendum)
Elevate blood sugar. She states, "became dizzy, chills, pain around neck area." onset is today. Here yesterday at clinic to have rt. Leg checked because of pain.

## 2014-04-26 NOTE — ED Provider Notes (Signed)
CSN: [id number]     Arrival date & time 04/26/14  1434 History   First MD Initiated Contact with Patient 04/26/14 1529     Chief Complaint  Patient presents with  . Hyperglycemia  . Headache  . Chills     (Consider location/radiation/quality/duration/timing/severity/associated sxs/prior Treatment) HPI Comments: 39 yo Hispanic female with PMHx of HTN, depression, and poorly-controlled T2DM with A1C>11 on last check and complicated by diabetic neuropathy who presents for high blood sugars (200-300 at home), with general malaise, dizziness, and chills c/w her previous episodes of high blood sugar. Pt also has mild HA that she gets with high sugars. No known precipitating factors. Pt describes her malaise as generalized weakness and fatigue with no focal numbness or numbness. Denies any recent fever, chills, cough, or sputum production. No chest pain, palpitations, or SOB. No dysuria or flank pain. No recent sick contacts. Pt states her sugars have been running high "for a while" and that her sx have been constant for 1-2 weeks. She has been seen by her PCP for these sx multiple times and was recently started on gabapentin for "tingling" pains in her legs bilaterally.   The history is provided by the patient. A language interpreter was used.    Past Medical History  Diagnosis Date  . Hypertension   . Depression   . Diabetes mellitus   . GERD (gastroesophageal reflux disease)    Past Surgical History  Procedure Laterality Date  . Cesarean section    . Tubal ligation    . Colonoscopy N/A 04/07/2014    Procedure: COLONOSCOPY;  Surgeon: Danie Binder, MD;  [facility];  Service: Endoscopy;  Laterality: N/A;  1130 - moved to 1/25 @ 11:00  . Esophagogastroduodenoscopy N/A 04/07/2014    Procedure: ESOPHAGOGASTRODUODENOSCOPY (EGD);  Surgeon: Danie Binder, MD;  [facility];  Service: Endoscopy;  Laterality: N/A;   Family History  Problem Relation Age of Onset  .  Hypertension Mother   . Diabetes Father   . Colon cancer Neg Hx    History  Substance Use Topics  . Smoking status: Never Smoker   . Smokeless tobacco: Never Used  . Alcohol Use: No   OB History    Gravida Para Term Preterm AB TAB SAB Ectopic Multiple Living   6 3 3       3      Review of Systems  Constitutional: Positive for chills and fatigue. Negative for fever and diaphoresis.  HENT: Negative for congestion, rhinorrhea and sore throat.   Eyes: Negative for photophobia and visual disturbance.  Respiratory: Negative for cough, shortness of breath and wheezing.   Cardiovascular: Negative for chest pain, palpitations and leg swelling.  Gastrointestinal: Negative for nausea, vomiting, abdominal pain and diarrhea.  Endocrine: Positive for polydipsia, polyphagia and polyuria.  Genitourinary: Negative for dysuria and flank pain.  Musculoskeletal: Negative for neck pain.  Skin: Negative for rash.  Neurological: Positive for dizziness and headaches. Negative for syncope, weakness, light-headedness and numbness.      Allergies  Review of patient's allergies indicates no known allergies.  Home Medications   Prior to Admission medications   Medication Sig Start Date End Date Taking? Authorizing Provider  aspirin EC 81 MG tablet Take 81 mg by mouth daily.    Historical Provider, MD  fluconazole (DIFLUCAN) 150 MG tablet Take 1 tablet (150 mg total) by mouth every 3 (three) days. 04/09/14   Leone Haven, MD  gabapentin (NEURONTIN) 300 MG capsule  Take 1 capsule (300 mg total) by mouth at bedtime. 04/25/14   Marina Goodell, MD  HYDROcodone-acetaminophen (NORCO/VICODIN) 5-325 MG per tablet Take 1-2 tablets by mouth every 4 (four) hours as needed for moderate pain or severe pain. 02/18/14   Charlesetta Shanks, MD  insulin NPH-regular Human (NOVOLIN 70/30) (70-30) 100 UNIT/ML injection Inject 10-11 Units into the skin 2 (two) times daily with a meal. 11u with breakfast and 10u with dinner 04/24/14    Leone Haven, MD  LORazepam (ATIVAN) 0.5 MG tablet Take 1 tablet (0.5 mg total) by mouth daily as needed for anxiety. 12/12/13   Carmin Muskrat, MD  metFORMIN (GLUCOPHAGE) 1000 MG tablet Take 1,000 mg by mouth 2 (two) times daily with a meal.    Historical Provider, MD  metroNIDAZOLE (FLAGYL) 500 MG tablet Take 1 tablet (500 mg total) by mouth 2 (two) times daily. 04/09/14   Leone Haven, MD  omeprazole (PRILOSEC) 20 MG capsule Take 1 capsule (20 mg total) by mouth 2 (two) times daily before a meal. 04/07/14   Danie Binder, MD  phenazopyridine (PYRIDIUM) 200 MG tablet Take 1 tablet (200 mg total) by mouth 3 (three) times daily as needed for pain. 02/18/14   Charlesetta Shanks, MD  ranitidine (ZANTAC) 150 MG tablet Take 1 tablet (150 mg total) by mouth 2 (two) times daily as needed for heartburn. 04/07/14   Danie Binder, MD   BP 154/83 mmHg  Pulse 64  Temp(Src) 98.4 F (36.9 C) (Oral)  Resp 20  SpO2 96%  LMP 04/16/2014 (Exact Date) Physical Exam  Constitutional: She is oriented to person, place, and time. She appears well-developed and well-nourished. No distress.  HENT:  Head: Normocephalic.  Mouth/Throat: No oropharyngeal exudate.  Eyes: Conjunctivae are normal. Pupils are equal, round, and reactive to light.  Neck: Neck supple. No JVD present.  Cardiovascular: Normal rate, normal heart sounds and intact distal pulses.   No murmur heard. Pulmonary/Chest: Effort normal and breath sounds normal. No respiratory distress. She has no wheezes. She has no rales.  Abdominal: Soft. She exhibits no distension. There is no tenderness. There is no rebound and no guarding.  Musculoskeletal: She exhibits no edema.  Neurological: She is alert and oriented to person, place, and time. She has normal strength. She is not disoriented. No cranial nerve deficit or sensory deficit. She exhibits normal muscle tone. GCS eye subscore is 4. GCS verbal subscore is 5. GCS motor subscore is 6.  Skin: Skin is  warm. No rash noted. She is not diaphoretic.  Nursing note and vitals reviewed.   ED Course  Procedures (including critical care time) Labs Review Labs Reviewed  COMPREHENSIVE METABOLIC PANEL - Abnormal; Notable for the following:    Glucose, Bld 281 (*)    All other components within normal limits  CBC - Abnormal; Notable for the following:    RBC 5.35 (*)    All other components within normal limits  URINALYSIS, ROUTINE W REFLEX MICROSCOPIC - Abnormal; Notable for the following:    Specific Gravity, Urine 1.036 (*)    Glucose, UA >1000 (*)    Hgb urine dipstick TRACE (*)    Ketones, ur 15 (*)    All other components within normal limits  CBG MONITORING, ED - Abnormal; Notable for the following:    Glucose-Capillary 277 (*)    All other components within normal limits  I-STAT VENOUS BLOOD GAS, ED - Abnormal; Notable for the following:    pH, Ven 7.379 (*)  pCO2, Ven 42.9 (*)    pO2, Ven 19.0 (*)    Bicarbonate 25.3 (*)    All other components within normal limits  CBG MONITORING, ED - Abnormal; Notable for the following:    Glucose-Capillary 287 (*)    All other components within normal limits  URINE MICROSCOPIC-ADD ON  POC URINE PREG, ED    Imaging Review No results found.   EKG Interpretation None    NSR, VR 65. Normal axis. PR 119, QRS 94, QTc 423. No acute ST-T segment changes. No EKG evidence of acute ischemia or infarct.   MDM   Final diagnoses:  Hyperglycemia    39 yo F with PMHx of HTN, depression, poorly-controlled T2DM with diabetic neuropathy who presents with hyperglycemia with general malaise, chills, and headache. See HPI above. On arrival, T 97.6F, HR 71, RR 12-16, BP 113/66, satting 100% on RA. Exam as above, pt overall well-appearing and in NAD. Neuro exam non-focal.  Pt's presentation is most c/w acute hyperglycemia, without complication. Labs sent and reviewed as above, with no evidence of DKA. CBC with no leukocytosis and no s/s infection,  with no cough, sputum production, or sx of PNA and UA without evidence of UTI. Blood gas without acidosis. CMP normal with no evidence of AKI or hepatitis. Regarding pt's HA, it is improved with 1L NS and suspect this is 2/2 mild dehydration from hyperglycemia. No neck stiffness, rigidity, fever, leukocytosis, or other s/s meningitis, encephalitis, and neuro exam non-focal.  Given chronicity of pt's sx, improvement with 1L NS, and negative/unremarkable labs without evidence of hyperglycemic hyperosmolar non-ketotic sx or DKA, will d/c with PCP f/u, dietary precautions, and strict return precautions.  Clinical Impression: 1. Hyperglycemia     Disposition: Discharge  Condition: Good  I have discussed the results, Dx and Tx plan with the pt(& family if present). He/she/they expressed understanding and agree(s) with the plan. Discharge instructions discussed at great length. Strict return precautions discussed and pt &/or family have verbalized understanding of the instructions. No further questions at time of discharge.    Discharge Medication List as of 04/26/2014  5:45 PM      Follow Up: [facility] [address] [place] [address] [phone]  Follow-up with your PCP in 2-3 days for repeat blood sugar check   Pt seen in conjunction with Dr. Raynelle Dick, MD 04/27/14 [id number]

## 2014-04-26 NOTE — ED Provider Notes (Signed)
I saw and evaluated the patient, reviewed the resident's note and I agree with the findings and plan.   EKG Interpretation None     Patient with elevated blood sugar. No evidence of DKA. Given IV fluids here in stable for discharge  Leota Jacobsen, MD 04/26/14 1826

## 2014-04-29 ENCOUNTER — Ambulatory Visit (HOSPITAL_COMMUNITY)
Admission: RE | Admit: 2014-04-29 | Discharge: 2014-04-29 | Disposition: A | Discharge status: Home / Self Care | Payer: Self-pay | Source: Ambulatory Visit | Attending: Family Medicine | Admitting: Family Medicine

## 2014-04-29 DIAGNOSIS — N939 Abnormal uterine and vaginal bleeding, unspecified: Secondary | ICD-10-CM | POA: Insufficient documentation

## 2014-04-29 DIAGNOSIS — R102 Pelvic and perineal pain: Secondary | ICD-10-CM | POA: Insufficient documentation

## 2014-04-29 LAB — GLUCOSE, CAPILLARY: GLUCOSE-CAPILLARY: 335 mg/dL — AB (ref 70–99)

## 2014-05-01 NOTE — Telephone Encounter (Signed)
REVIEWED. AGREE. NO ADDITIONAL RECOMMENDATIONS. 

## 2014-05-01 NOTE — Telephone Encounter (Signed)
I called interpreter, Vicente Males [phone]). She called pt and heard many rings but no Vm and no answer. I will mail a letter if pt still has questions, please have interpreter call us.

## 2014-05-02 ENCOUNTER — Telehealth: Payer: Self-pay | Admitting: *Deleted

## 2014-05-02 NOTE — Telephone Encounter (Signed)
Called patient back using pacific interpretors. States she was given gabapentin and had a bad reaction to this. Headache, blurry vision, dizzy, and was paralyzed the first day she took it. Her tongue went numb. Lips got cracked. Got pressure in chest. Still having these symptoms. Still feels dizzy. Tongue not as numb. Dizzy to the point that she can not stay stable on her feet. Still having chest pressure. I recommended that patient go to the emergency room immediately for further evaluation. She voiced understanding.

## 2014-05-02 NOTE — Telephone Encounter (Signed)
Called pt and used [name] (ID# [phone]) and advised pt as directed below. Patient scheduled for endometrial biopsy with PCP on 05/19/14 @3 :30pm. She stated that when she came in recently she was started on Gabapentin for her leg pain and that she has been having side effects from med since starting it on 04/29/14 and sx include HA, trouble with vision, lots of pain, dry mouth and was having numbness all over body and tongue but that has gotten better. She also stated that she still has the chest pain that she has spoken about before. She wanted to know what to do about this as she is still taking Gabapentin. I advised pt to stop taking med and would let PCP know and to seek medical attention if sx get worse. Please advise. Oletta Lamas, CMA.

## 2014-05-02 NOTE — Telephone Encounter (Signed)
-----   Message from Leone Haven, MD sent at 05/01/2014 11:01 AM EST ----- Please inform the patient that her [place] did not reveal any fibroids and that she will need to return to clinic for an endometrial biopsy. This can either be scheduled in my clinic (though would need 30 minutes blocked out) or in women's health clinic. Thanks.

## 2014-05-03 ENCOUNTER — Encounter (HOSPITAL_COMMUNITY): Payer: Self-pay | Admitting: *Deleted

## 2014-05-03 ENCOUNTER — Emergency Department (HOSPITAL_COMMUNITY)
Admission: EM | Admit: 2014-05-03 | Discharge: 2014-05-03 | Disposition: A | Discharge status: Home / Self Care | Payer: No Typology Code available for payment source | Attending: Emergency Medicine | Admitting: Emergency Medicine

## 2014-05-03 ENCOUNTER — Emergency Department (HOSPITAL_COMMUNITY): Payer: Self-pay

## 2014-05-03 ENCOUNTER — Emergency Department (HOSPITAL_COMMUNITY): Payer: No Typology Code available for payment source

## 2014-05-03 DIAGNOSIS — I10 Essential (primary) hypertension: Secondary | ICD-10-CM | POA: Insufficient documentation

## 2014-05-03 DIAGNOSIS — R0602 Shortness of breath: Secondary | ICD-10-CM | POA: Insufficient documentation

## 2014-05-03 DIAGNOSIS — Z7982 Long term (current) use of aspirin: Secondary | ICD-10-CM | POA: Insufficient documentation

## 2014-05-03 DIAGNOSIS — G8929 Other chronic pain: Secondary | ICD-10-CM | POA: Insufficient documentation

## 2014-05-03 DIAGNOSIS — F329 Major depressive disorder, single episode, unspecified: Secondary | ICD-10-CM | POA: Insufficient documentation

## 2014-05-03 DIAGNOSIS — R05 Cough: Secondary | ICD-10-CM | POA: Insufficient documentation

## 2014-05-03 DIAGNOSIS — G629 Polyneuropathy, unspecified: Secondary | ICD-10-CM | POA: Insufficient documentation

## 2014-05-03 DIAGNOSIS — Z3202 Encounter for pregnancy test, result negative: Secondary | ICD-10-CM | POA: Insufficient documentation

## 2014-05-03 DIAGNOSIS — K219 Gastro-esophageal reflux disease without esophagitis: Secondary | ICD-10-CM | POA: Insufficient documentation

## 2014-05-03 DIAGNOSIS — E119 Type 2 diabetes mellitus without complications: Secondary | ICD-10-CM | POA: Insufficient documentation

## 2014-05-03 DIAGNOSIS — R202 Paresthesia of skin: Secondary | ICD-10-CM | POA: Insufficient documentation

## 2014-05-03 DIAGNOSIS — R0789 Other chest pain: Secondary | ICD-10-CM | POA: Insufficient documentation

## 2014-05-03 DIAGNOSIS — Z79899 Other long term (current) drug therapy: Secondary | ICD-10-CM | POA: Insufficient documentation

## 2014-05-03 DIAGNOSIS — Z794 Long term (current) use of insulin: Secondary | ICD-10-CM | POA: Insufficient documentation

## 2014-05-03 HISTORY — DX: Dizziness and giddiness: R42

## 2014-05-03 HISTORY — DX: Headache: R51

## 2014-05-03 HISTORY — DX: Headache, unspecified: R51.9

## 2014-05-03 HISTORY — DX: Personal history of other medical treatment: Z92.89

## 2014-05-03 HISTORY — DX: Pelvic and perineal pain: R10.2

## 2014-05-03 HISTORY — DX: Other cervical disc degeneration, unspecified cervical region: M50.30

## 2014-05-03 HISTORY — DX: Polyneuropathy, unspecified: G62.9

## 2014-05-03 HISTORY — DX: Chest pain, unspecified: R07.9

## 2014-05-03 HISTORY — DX: Other chronic pain: G89.29

## 2014-05-03 LAB — I-STAT CHEM 8, ED
BUN: 5 mg/dL — ABNORMAL LOW (ref 6–23)
Calcium, Ion: 1.17 mmol/L (ref 1.12–1.23)
Chloride: 100 mmol/L (ref 96–112)
Creatinine, Ser: 0.3 mg/dL — ABNORMAL LOW (ref 0.50–1.10)
Glucose, Bld: 146 mg/dL — ABNORMAL HIGH (ref 70–99)
HCT: 41 % (ref 36.0–46.0)
HEMOGLOBIN: 13.9 g/dL (ref 12.0–15.0)
Potassium: 3.6 mmol/L (ref 3.5–5.1)
Sodium: 139 mmol/L (ref 135–145)
TCO2: 22 mmol/L (ref 0–100)

## 2014-05-03 LAB — URINALYSIS, ROUTINE W REFLEX MICROSCOPIC
BILIRUBIN URINE: NEGATIVE
Glucose, UA: NEGATIVE mg/dL
Hgb urine dipstick: NEGATIVE
KETONES UR: NEGATIVE mg/dL
Leukocytes, UA: NEGATIVE
Nitrite: NEGATIVE
PH: 6 (ref 5.0–8.0)
PROTEIN: NEGATIVE mg/dL
SPECIFIC GRAVITY, URINE: 1.009 (ref 1.005–1.030)
Urobilinogen, UA: 0.2 mg/dL (ref 0.0–1.0)

## 2014-05-03 LAB — I-STAT TROPONIN, ED: TROPONIN I, POC: 0 ng/mL (ref 0.00–0.08)

## 2014-05-03 LAB — PREGNANCY, URINE: Preg Test, Ur: NEGATIVE

## 2014-05-03 NOTE — ED Notes (Signed)
Patient stated by drinking some water, her stomach hurts and she feels like she is going to vomit.

## 2014-05-03 NOTE — Discharge Instructions (Signed)
°Emergency Department Resource Guide °1) Find a Doctor and Pay Out of Pocket °Although you won't have to find out who is covered by your insurance plan, it is a good idea to ask around and get recommendations. You will then need to call the office and see if the doctor you have chosen will accept you as a new patient and what types of options they offer for patients who are self-pay. Some doctors offer discounts or will set up payment plans for their patients who do not have insurance, but you will need to ask so you aren't surprised when you get to your appointment. ° °2) Contact Your Local Health Department °Not all health departments have doctors that can see patients for sick visits, but many do, so it is worth a call to see if yours does. If you don't know where your local health department is, you can check in your phone book. The CDC also has a tool to help you locate your state's health department, and many state websites also have listings of all of their local health departments. ° °3) Find a Walk-in Clinic °If your illness is not likely to be very severe or complicated, you may want to try a walk in clinic. These are popping up all over the country in pharmacies, drugstores, and shopping centers. They're usually staffed by nurse practitioners or physician assistants that have been trained to treat common illnesses and complaints. They're usually fairly quick and inexpensive. However, if you have serious medical issues or chronic medical problems, these are probably not your best option. ° °No Primary Care Doctor: °- Call [facility] Connect at  832-8000 - they can help you locate a primary care doctor that  accepts your insurance, provides certain services, etc. °- Physician Referral Service- 1-800-533-3463 ° °Chronic Pain Problems: °Organization         Address  Phone   Notes  °[facility]  ([phone]) [phone] Patients need to be referred by their primary care doctor.  ° °Medication  Assistance: °Organization         Address  Phone   Notes  °[place] [facility] [address] °[place], [place] [address] (336) 641-8030 --Must be a resident of [place] °-- Must have NO insurance coverage whatsoever (no Medicaid/ Medicare, etc.) °-- The pt. MUST have a primary care doctor that directs their care regularly and follows them in the community °  °[facility]  (866) 331-1348   °[employer]  (888) 892-1162   ° °Agencies that provide inexpensive medical care: °Organization         Address  Phone   Notes  °[facility]  (336) 832-8035   °[facility]    (336) 832-7272   °[facility] [address] °[place], [place] [address] (336) 832-4777   °[facility] [address], °[place] (336) 271-4999   °[facility]    (336) 373-0678   °[facility]    (336) 272-1050   °[facility] ° [address], [place] Phone:  (336) 832-4444, Fax:  (336) 832-4440 Hours of Operation:  9 am - 6 pm, M-F.  Also accepts Medicaid/Medicare and self-pay.  °[facility] ° [address], [place] Phone: (336) [phone], Fax: (336) [phone]. Hours of Operation:  8:30 am - 5:30 pm, M-F.  Also accepts Medicaid and self-pay.  °[facility] [place] [address], [place] Phone: (336) 878-6027   °[facility] [address], [place], [place] (336)723-1848, Ext. 123 Mondays & Thursdays: 7-9 AM.  First 15 patients are seen on a first come, first serve basis. °  ° °Medicaid-accepting [place] Providers: ° °Organization         Address  Phone   Notes  °[facility] [address], [place] (336) 641-2100 Also accepts self-pay patients.  °[facility] [address], [place] ° (336) 856-9996   °[facility] [address], [place]  (336) 288-8857   °[facility] [address] [place] [address], [place] (336) 299-7000   °[facility] [address], [place]  ° (336) 373-1557 Only accepts [place] Access Medicaid patients after they have their name applied to their card.  ° °Self-Pay (no insurance) in [place]: ° °Organization         Address  Phone   Notes  °Sickle Cell Patients, [facility] [address], [place] (336) 832-1970   °[facility] [address], [place] (336) 832-4400   ° [facility] ° [address], [place] (336) 992-4800   °[facility]/Dr. Osei-Bonsu ° [address] [place] [address], [place] or [address], [place] (336) [phone] Phone number for both [place] and [place] locations is the same.  °[facility] [address], [place] (336) 299-0000   °[facility] [address] [place] [address], [place] or [address] (336) 852-7530 °(336) 878-2260   °[facility] [address], [place] (336) 350-1642, phone; (336) 294-5005, fax Sees patients 1st and 3rd Saturday of every month.  Must not qualify for public or private insurance (i.e. Medicaid, Medicare, [place] Health Choice, Veterans' Benefits) • Household income should be no more than 200% of the poverty level •The clinic cannot treat you if you are pregnant or think you are pregnant • Sexually transmitted diseases are not treated at the clinic.  ° ° °Dental Care: °Organization         Address  Phone  Notes  °[place] [facility] [address],  (336) 641-6152 Accepts children up to age 21 who are enrolled in Medicaid or [place] Health Choice; pregnant women with a Medicaid card; and children who have applied for Medicaid or [place] Health Choice, but were declined, whose parents can pay a reduced fee at time of service.  °[place]  [facility] [place]  [address], [place] (336) 641-7733 Accepts children up to age 21 who are enrolled in Medicaid or [place] Health Choice; pregnant women with a Medicaid card; and children who have applied for Medicaid or  Health Choice, but were declined, whose parents can pay a reduced fee at time of service.  °[facility] ° [address],  (336) 641-4533 Patients are seen by appointment only. Walk-ins are not accepted. [facility] will see patients 18 years of age and older. °Monday - Tuesday (8am-5pm) °Most Wednesdays (8:30-5pm) °$30 per visit, cash only  °[facility] ° [address], [place] (336) 641-4533 Patients are seen by appointment only. Walk-ins are not accepted. [facility] will see patients 18 years of age and older. °One   Wednesday Evening (Monthly: Volunteer Based).  $30 per visit, cash only  °[facility]  (919) 537-3737 for adults; Children under age 4, call Graduate Pediatric Dentistry at ([phone]) [phone]. Children aged 4-14, please call ([phone]) [phone] to request a pediatric application. ° Dental services are provided in all areas of dental care including fillings, crowns and bridges, complete and partial dentures, implants, gum treatment, root canals, and extractions. Preventive care is also provided. Treatment is provided to both adults and children. °Patients are selected via a lottery and there is often a waiting list. °  °[facility] [address], °[place] ° (336) 763-8833 [email or web address].[email or web address] °  °[facility] [address], [place], [place] (336)723-1848, Ext. 123 Second and Fourth Thursday of each month, opens at 6:30 AM; Clinic ends at 9 AM.  Patients are seen on a first-come first-served basis, and a limited number are seen during each clinic.  ° °[facility] ° [address], [place], [place] (336) 723-7904    Eligibility Requirements °You must have lived in [place], [place], or [place] counties for at least the last three months. °  You cannot be eligible for state or federal sponsored healthcare insurance, including Veterans Administration, Medicaid, or Medicare. °  You generally cannot be eligible for healthcare insurance through your employer.  °  How to apply: °Eligibility screenings are held every Tuesday and Wednesday afternoon from 1:00 pm until 4:00 pm. You do not need an appointment for the interview!  °[facility] [address], [place], [place] 336-631-2330   °[facility]  336-342-8273   °[facility]  336-703-3100   °[facility]  336-570-6415   ° °Behavioral Health Resources in the Community: °Intensive Outpatient Programs °Organization         Address  Phone  Notes  °[facility] [address], [place], [place] 336-878-6098   °[facility] [address], [place], [place] 336-832-9800   °[facility] [address], [place], [place] ° 336-882-2125   °[facility] [address],  °[place], [place] 1-800-853-5163 or 336-641-4981   °Substance Abuse Resources °Organization         Address  Phone  Notes  °[facility]  336-882-2125   °[facility]  336-784-9470   °[facility]  336-285-9073   °[facility]  336-845-3988   °Residential & Outpatient Substance Abuse Program  1-800-659-3381   °Psychological Services °Organization         Address  Phone  Notes  °[facility]  336- 832-9600   °[facility]  336- 378-7881   °[facility] [address], [place] 1-800-853-5163 or 336-641-4981   ° °Mobile Crisis Teams °Organization         Address  Phone  Notes  °[facility], Mobile Crisis Care Unit  1-877-626-1772   °Assertive °[facility] ° [address]  [place], [place] 336-834-9664   °[facility] DeEsch [address] °[place] 336-554-5454   ° °Self-Help/Support Groups °Organization         Address  Phone             Notes  °[facility]  - variety of support groups  336- 373-1402 Call for more information  °Narcotics Anonymous (NA), [facility] [address], °[place]   2 meetings at this location  ° °  Residential Treatment Programs Organization         Address  Phone  Notes  [facility] [address]    [place]  1-956-667-9567   [facility]  [address] [place] [address], [place], [place]   [place], [place] [phone] Admissions: 8am-3pm M-F  [facility] [address],    [place], [place] [phone]   [facility] [address] [place], [place], [place]   [facility] [address]  [place], [place]   [facility] Intensive Outpatient [address], [place], [place]   [facility] ([place]  [place], [place] 1-872-267-0102 or 548-875-7934   [facility] [address], [place], [place] Accepts Medicaid  [facility] [address]  [place] 1-865-164-2973 Substance Abuse/Addiction Treatment   [facility]         Address  Phone  Notes  [facility]  782-026-0938   Domenic Schwab, PhD [address] [place], [place]   (502) 338-7096 or (367)[phone]   [facility] [place], [place] 806-866-7786   [facility] [address] [place], [place] 203-127-9898 Insurance/Medicaid/sponsorship through [facility] and Families [address]                                    [place], [place] ([phone] [place] [address], [place] ([phone]    Dr. Adele Schilder  [phone]   [facility] Dept. 1) [address], [place] 2) [place] 3)  [address], [place] [phone]) [phone]  ([phone]   [place] 331-477-0199 or [phone] (After Hours)      Take your usual prescriptions as previously directed.  Call your regular medical doctor on Monday to schedule a follow up appointment within the next 3 days.  Return to the Emergency Department immediately sooner if worsening.

## 2014-05-03 NOTE — ED Provider Notes (Signed)
CSN: [id number]     Arrival date & time 05/03/14  9924 History   First MD Initiated Contact with Patient 05/03/14 [phone]     Chief Complaint  Patient presents with  . Chest Pain  . Shortness of Breath      HPI Pt was seen at 0705. Per pt and her family, c/o gradual onset and persistence of constant chest "pain" for the past 2 to 3 weeks. Has been associated with SOB and mild cough. Pt also states since she started taking neurontin 1 week ago, she has been experiencing "numbness all over her body" as well as ongoing "dizziness" for the past 2 weeks (pt cannot further clarify her "dizziness"). Pt has been evaluated by her PMD x2 in the past 2 weeks for these symptoms, with reassuring EKG and referral to Cardiologist. Pt has also been evaluated in the ED this past week for hyperglycemia. Denies any change in her symptoms over the past 2 weeks. Denies palpitations, no abd pain, no N/V/D, no back pain, no focal motor weakness, no slurred speech, no facial droop, no fevers, no rash.     Past Medical History  Diagnosis Date  . Hypertension   . Depression   . Diabetes mellitus   . GERD (gastroesophageal reflux disease)   . Peripheral neuropathy   . Chronic chest pain   . Dizziness   . Chronic pelvic pain in female   . DDD (degenerative disc disease), cervical   . Headache   . H/O echocardiogram 09/2013    normal   Past Surgical History  Procedure Laterality Date  . Cesarean section    . Tubal ligation    . Colonoscopy N/A 04/07/2014    Procedure: COLONOSCOPY;  Surgeon: Danie Binder, MD;  [facility];  Service: Endoscopy;  Laterality: N/A;  1130 - moved to 1/25 @ 11:00  . Esophagogastroduodenoscopy N/A 04/07/2014    Procedure: ESOPHAGOGASTRODUODENOSCOPY (EGD);  Surgeon: Danie Binder, MD;  [facility];  Service: Endoscopy;  Laterality: N/A;   Family History  Problem Relation Age of Onset  . Hypertension Mother   . Diabetes Father   . Colon cancer Neg Hx     History  Substance Use Topics  . Smoking status: Never Smoker   . Smokeless tobacco: Never Used  . Alcohol Use: No   OB History    Gravida Para Term Preterm AB TAB SAB Ectopic Multiple Living   6 3 3       3      Review of Systems ROS: Statement: All systems negative except as marked or noted in the HPI; Constitutional: Negative for fever and chills. ; ; Eyes: Negative for eye pain, redness and discharge. ; ; ENMT: Negative for ear pain, hoarseness, nasal congestion, sinus pressure and sore throat. ; ; Cardiovascular: +CP, SOB. Negative for palpitations, diaphoresis, and peripheral edema. ; ; Respiratory: +cough. Negative for wheezing and stridor. ; ; Gastrointestinal: Negative for nausea, vomiting, diarrhea, abdominal pain, blood in stool, hematemesis, jaundice and rectal bleeding. . ; ; Genitourinary: Negative for dysuria, flank pain and hematuria. ; ; Musculoskeletal: Negative for back pain and neck pain. Negative for swelling and trauma.; ; Skin: Negative for pruritus, rash, abrasions, blisters, bruising and skin lesion.; ; Neuro: +"dizziness," paresthesias. Negative for headache, lightheadedness and neck stiffness. Negative for weakness, altered level of consciousness , altered mental status, extremity weakness, involuntary movement, seizure and syncope.      Allergies  Review of patient's allergies indicates no  known allergies.  Home Medications   Prior to Admission medications   Medication Sig Start Date End Date Taking? Authorizing Provider  aspirin EC 81 MG tablet Take 81 mg by mouth daily.    Historical Provider, MD  fluconazole (DIFLUCAN) 150 MG tablet Take 1 tablet (150 mg total) by mouth every 3 (three) days. 04/09/14   Leone Haven, MD  gabapentin (NEURONTIN) 300 MG capsule Take 1 capsule (300 mg total) by mouth at bedtime. Patient not taking: Reported on 04/26/2014 04/25/14   Marina Goodell, MD  HYDROcodone-acetaminophen (NORCO/VICODIN) 5-325 MG per tablet Take 1-2  tablets by mouth every 4 (four) hours as needed for moderate pain or severe pain. Patient not taking: Reported on 04/26/2014 02/18/14   Charlesetta Shanks, MD  insulin NPH-regular Human (NOVOLIN 70/30) (70-30) 100 UNIT/ML injection Inject 10-11 Units into the skin 2 (two) times daily with a meal. 11u with breakfast and 10u with dinner 04/24/14   Leone Haven, MD  LORazepam (ATIVAN) 0.5 MG tablet Take 1 tablet (0.5 mg total) by mouth daily as needed for anxiety. 12/12/13   Carmin Muskrat, MD  metFORMIN (GLUCOPHAGE) 1000 MG tablet Take 1,000 mg by mouth 2 (two) times daily with a meal.    Historical Provider, MD  metroNIDAZOLE (FLAGYL) 500 MG tablet Take 1 tablet (500 mg total) by mouth 2 (two) times daily. Patient not taking: Reported on 04/26/2014 04/09/14   Leone Haven, MD  omeprazole (PRILOSEC) 20 MG capsule Take 1 capsule (20 mg total) by mouth 2 (two) times daily before a meal. 04/07/14   Danie Binder, MD  phenazopyridine (PYRIDIUM) 200 MG tablet Take 1 tablet (200 mg total) by mouth 3 (three) times daily as needed for pain. 02/18/14   Charlesetta Shanks, MD  ranitidine (ZANTAC) 150 MG tablet Take 1 tablet (150 mg total) by mouth 2 (two) times daily as needed for heartburn. Patient taking differently: Take 150 mg by mouth daily.  04/07/14   Danie Binder, MD   BP 107/65 mmHg  Pulse 58  Temp(Src) 98.3 F (36.8 C) (Oral)  Resp 19  SpO2 98%  LMP 04/16/2014 (Exact Date) Physical Exam  0710: Physical examination:  Nursing notes reviewed; Vital signs and O2 SAT reviewed;  Constitutional: Well developed, Well nourished, Well hydrated, In no acute distress; Head:  Normocephalic, atraumatic; Eyes: EOMI, PERRL, No scleral icterus; ENMT: Mouth and pharynx normal, Mucous membranes moist; Neck: Supple, Full range of motion, No lymphadenopathy; Cardiovascular: Regular rate and rhythm, No murmur, rub, or gallop; Respiratory: Breath sounds clear & equal bilaterally, No rales, rhonchi, wheezes.  Speaking full  sentences with ease, Normal respiratory effort/excursion; Chest: Nontender, Movement normal; Abdomen: Soft, Nontender, Nondistended, Normal bowel sounds; Genitourinary: No CVA tenderness; Extremities: Pulses normal, No tenderness, No edema, No calf edema or asymmetry.; Neuro: AA&Ox3, Major CN grossly intact. No facial droop. Speech clear. No gross focal motor or sensory deficits in extremities.; Skin: Color normal, Warm, Dry.; Psych:  Affect flat, poor eye contact.    ED Course  Procedures     EKG Interpretation   Date/Time:  Saturday May 03 2014 06:46:46 EST Ventricular Rate:  66 PR Interval:  127 QRS Duration: 93 QT Interval:  442 QTC Calculation: 463 R Axis:   14 Text Interpretation:  Sinus rhythm Low voltage, precordial leads Baseline  wander When compared with ECG of 04/26/2014 No significant change was found  Confirmed by [facility]  MD, Nunzio Cory ([id number]) on 05/03/2014 7:16:16 AM      MDM  MDM Reviewed: previous chart, nursing note and vitals Reviewed previous: labs and ECG Interpretation: labs, ECG, x-ray and CT scan      Results for orders placed or performed during the hospital encounter of 05/03/14  Pregnancy, urine  Result Value Ref Range   Preg Test, Ur NEGATIVE NEGATIVE  Urinalysis, Routine w reflex microscopic  Result Value Ref Range   Color, Urine YELLOW YELLOW   APPearance CLEAR CLEAR   Specific Gravity, Urine 1.009 1.005 - 1.030   pH 6.0 5.0 - 8.0   Glucose, UA NEGATIVE NEGATIVE mg/dL   Hgb urine dipstick NEGATIVE NEGATIVE   Bilirubin Urine NEGATIVE NEGATIVE   Ketones, ur NEGATIVE NEGATIVE mg/dL   Protein, ur NEGATIVE NEGATIVE mg/dL   Urobilinogen, UA 0.2 0.0 - 1.0 mg/dL   Nitrite NEGATIVE NEGATIVE   Leukocytes, UA NEGATIVE NEGATIVE  I-stat Chem 8, ED  Result Value Ref Range   Sodium 139 135 - 145 mmol/L   Potassium 3.6 3.5 - 5.1 mmol/L   Chloride 100 96 - 112 mmol/L   BUN 5 (L) 6 - 23 mg/dL   Creatinine, Ser 0.30 (L) 0.50 - 1.10 mg/dL    Glucose, Bld 146 (H) 70 - 99 mg/dL   Calcium, Ion 1.17 1.12 - 1.23 mmol/L   TCO2 22 0 - 100 mmol/L   Hemoglobin 13.9 12.0 - 15.0 g/dL   HCT 41.0 36.0 - 46.0 %  I-stat troponin, ED  Result Value Ref Range   Troponin i, poc 0.00 0.00 - 0.08 ng/mL   Comment 3           Dg Chest 2 View 05/03/2014   CLINICAL DATA:  Chest pain and shortness of breath for 2 weeks.  EXAM: CHEST  2 VIEW  COMPARISON:  12/12/2013  FINDINGS: The cardiac silhouette, mediastinal and hilar contours are within normal limits and stable. The lungs are clear. Low lung volumes. No pleural effusion. The bony thorax is intact.  IMPRESSION: No acute cardiopulmonary findings. No change since prior examination.   Electronically Signed   By: Marijo Sanes M.D.   On: 05/03/2014 08:11   Ct Head Wo Contrast 05/03/2014   CLINICAL DATA:  Contrast reaction. Dizziness, unsteady gait and headache.  EXAM: CT HEAD WITHOUT CONTRAST  TECHNIQUE: Contiguous axial images were obtained from the base of the skull through the vertex without intravenous contrast.  COMPARISON:  12/06/2013.  FINDINGS: The ventricles are normal in size and configuration. No extra-axial fluid collections are identified. The gray-white differentiation is normal. No CT findings for acute intracranial process such as hemorrhage or infarction. No mass lesions. The brainstem and cerebellum are grossly normal.  The bony structures are intact. The paranasal sinuses and mastoid air cells are clear. The globes are intact.  IMPRESSION: Normal head CT.   Electronically Signed   By: Marijo Sanes M.D.   On: 05/03/2014 08:30    0925:  Pt not orthostatic on VS. Has tol PO well without N/V. Pt states she is ready to go home now. Workup reassuring. Neuro exam unchanged. Doubt PE as cause for symptoms with low risk [place].  Doubt ACS as cause for symptoms with normal troponin and unchanged EKG from previous after 2 -3 weeks of constant symptoms. Dx and testing d/w pt and family.  Questions answered.   Verb understanding, agreeable to d/c home with outpt f/u.     Francine Graven, DO 05/06/14 [phone]

## 2014-05-03 NOTE — ED Notes (Signed)
Pt reports that she started taking gabapentin two days ago, now has mid chest pain and back pain, sob, cough and generalized numbness to her body. No acute distress noted.

## 2014-05-07 ENCOUNTER — Encounter (INDEPENDENT_AMBULATORY_CARE_PROVIDER_SITE_OTHER): Payer: Self-pay | Admitting: Ophthalmology

## 2014-05-09 LAB — HM DIABETES EYE EXAM

## 2014-05-15 ENCOUNTER — Encounter (INDEPENDENT_AMBULATORY_CARE_PROVIDER_SITE_OTHER): Payer: Self-pay | Admitting: Ophthalmology

## 2014-05-15 DIAGNOSIS — E11319 Type 2 diabetes mellitus with unspecified diabetic retinopathy without macular edema: Secondary | ICD-10-CM

## 2014-05-15 DIAGNOSIS — E119 Type 2 diabetes mellitus without complications: Secondary | ICD-10-CM

## 2014-05-15 DIAGNOSIS — H43813 Vitreous degeneration, bilateral: Secondary | ICD-10-CM

## 2014-05-15 LAB — HM DIABETES EYE EXAM

## 2014-05-19 ENCOUNTER — Ambulatory Visit (INDEPENDENT_AMBULATORY_CARE_PROVIDER_SITE_OTHER): Payer: No Typology Code available for payment source | Admitting: Family Medicine

## 2014-05-19 ENCOUNTER — Encounter: Payer: Self-pay | Admitting: Family Medicine

## 2014-05-19 VITALS — BP 113/81 | HR 81 | Temp 98.3°F | Wt 201.0 lb

## 2014-05-19 DIAGNOSIS — R102 Pelvic and perineal pain: Secondary | ICD-10-CM

## 2014-05-19 DIAGNOSIS — N939 Abnormal uterine and vaginal bleeding, unspecified: Secondary | ICD-10-CM

## 2014-05-19 LAB — POCT URINE PREGNANCY: Preg Test, Ur: NEGATIVE

## 2014-05-19 NOTE — Patient Instructions (Signed)
Biopsia de endometrio - Music therapist (Endometrial Biopsy, Care After) Siga estas instrucciones durante las prximas semanas. Estas indicaciones le proporcionan informacin general acerca de cmo deber cuidarse despus del procedimiento. El mdico tambin podr darle instrucciones ms especficas. El tratamiento se ha planificado de acuerdo a las prcticas mdicas actuales, pero a veces se producen problemas. Comunquese con el mdico si tiene algn problema o tiene dudas despus del procedimiento. QU ESPERAR DESPUS DEL PROCEDIMIENTO Despus del procedimiento, es tpico tener las siguientes sensaciones:  Sentir clicos leves y tendr una pequea cantidad de sangrado vaginal durante algunos das despus del procedimiento. Esto es normal. INSTRUCCIONES PARA EL CUIDADO EN EL HOGAR  Tome slo medicamentos de venta libre o recetados, segn las indicaciones del mdico.  No utilice tampones, duchas vaginales ni tenga relaciones sexuales hasta que el profesional la autorice.  Siga las indicaciones del mdico relacionadas con la restriccin a ciertas actividades, como ejercicios fsicos intensos o levantar objetos pesados. SOLICITE ATENCIN MDICA SI:  Tiene un sangrado abundante o sangra durante ms de 2 das despus del procedimiento.  Advierte un olor ftido que proviene de la vagina.  Siente escalofros o tiene fiebre.  Siente un dolor en el bajo vientre (abdominal) muy intenso. SOLICITE ATENCIN MDICA DE INMEDIATO SI:  Siente clicos intensos en el estmago o en la espalda.  Elimina cogulos grandes.  La hemorragia aumenta.  Se siente mareada, dbil, o se desmaya. Document Released: 12/19/2012 [facility] Patient Information 2015 [place]. This information is not intended to replace advice given to you by your health care provider. Make sure you discuss any questions you have with your health care provider.

## 2014-05-19 NOTE — Progress Notes (Signed)
Patient ID: [name], female   DOB: 12-07-1975, 39 y.o.   MRN: [id number] Endometrial Biopsy Procedure Note  Pre-operative Diagnosis: abnormal uterine bleeding  Post-operative Diagnosis: same  Indications: abnormal uterine bleeding - 2-3 months of periods lasting 2-3 weeks  Procedure Details   Urine pregnancy test was done and result was negative.  The risks (including infection, bleeding, pain, and uterine perforation) and benefits of the procedure were explained to the patient and Written informed consent was obtained.  Antibiotic prophylaxis against endocarditis was not indicated.   The patient was placed in the dorsal lithotomy position.  Bimanual exam showed the uterus to be in the anteroflexed position.  A Graves' speculum inserted in the vagina, and the cervix prepped with povidone iodine.  Endocervical curettage with a Kevorkian curette was not performed.   A sharp tenaculum was applied to the anterior lip of the cervix for stabilization.  A sterile uterine sound was used to sound the uterus to a depth of 4cm.  A pipet curet endometrial suction curette was used to sample the endometrium.  Sample was sent for pathologic examination.  Condition: Stable  Complications: None  Plan:  The patient was advised to call for any fever or for prolonged or severe pain or bleeding. She was advised to use OTC ibuprofen as needed for mild to moderate pain. She was advised to avoid vaginal intercourse for 48 hours or until the bleeding has completely stopped.

## 2014-05-20 ENCOUNTER — Encounter: Payer: Self-pay | Admitting: Interventional Cardiology

## 2014-05-20 ENCOUNTER — Ambulatory Visit (INDEPENDENT_AMBULATORY_CARE_PROVIDER_SITE_OTHER): Payer: No Typology Code available for payment source | Admitting: Interventional Cardiology

## 2014-05-20 VITALS — BP 100/62 | HR 75 | Ht 59.0 in | Wt 201.0 lb

## 2014-05-20 DIAGNOSIS — R079 Chest pain, unspecified: Secondary | ICD-10-CM

## 2014-05-20 DIAGNOSIS — R0602 Shortness of breath: Secondary | ICD-10-CM

## 2014-05-20 DIAGNOSIS — E118 Type 2 diabetes mellitus with unspecified complications: Secondary | ICD-10-CM

## 2014-05-20 DIAGNOSIS — R0789 Other chest pain: Secondary | ICD-10-CM

## 2014-05-20 NOTE — Patient Instructions (Signed)
Your physician has requested that you have a stress echocardiogram. For further information please visit [email or web address]. Please follow instruction sheet as given.  Your physician recommends that you schedule a follow-up appointment based on results of stress echo.

## 2014-05-20 NOTE — Progress Notes (Signed)
Patient ID: Zaylee Cornia, female   DOB: 1975/07/07, 39 y.o.   MRN: [id number]     Patient ID: Gavrielle Streck MRN: [id number] DOB/AGE: [birth year] 39 y.o.   Referring Physician Dr. Caryl Bis   Reason for Consultation  Chest pain  HPI: 39 y/o who has had chest pain and leg numbness.  She walks to the bus stop.  Her walking is limited mostly by her legs.  Sometimes, she can have chest pain when she walks.  SHe gets DOE as well.  CP can occur 2x/week.  It can last hours.  SHe was evaluated in the ER but not admitted.  She can get pain at random times and just not pay attention to it.  Occasional leg swelling.    SHe has been diabetic for many years, 13 years. Her insulin began recently.     Current Outpatient Prescriptions  Medication Sig Dispense Refill  . insulin NPH-regular Human (NOVOLIN 70/30) (70-30) 100 UNIT/ML injection Inject 10-11 Units into the skin 2 (two) times daily with a meal. 11u with breakfast and 10u with dinner 10 mL 3  . metFORMIN (GLUCOPHAGE) 1000 MG tablet Take 1,000 mg by mouth 2 (two) times daily with a meal.    . omeprazole (PRILOSEC) 20 MG capsule Take 1 capsule (20 mg total) by mouth 2 (two) times daily before a meal. 60 capsule 11  . phenazopyridine (PYRIDIUM) 200 MG tablet Take 1 tablet (200 mg total) by mouth 3 (three) times daily as needed for pain. 6 tablet 0  . ranitidine (ZANTAC) 150 MG tablet Take 1 tablet (150 mg total) by mouth 2 (two) times daily as needed for heartburn. (Patient taking differently: Take 150 mg by mouth daily. ) 60 tablet 1   No current facility-administered medications for this visit.   Past Medical History  Diagnosis Date  . Hypertension   . Depression   . Diabetes mellitus   . GERD (gastroesophageal reflux disease)   . Peripheral neuropathy   . Chronic chest pain   . Dizziness   . Chronic pelvic pain in female   . DDD (degenerative disc disease), cervical   . Headache   . H/O echocardiogram 09/2013    normal      Family History  Problem Relation Age of Onset  . Hypertension Mother   . Diabetes Father   . Colon cancer Neg Hx     History   Social History  . Marital Status: Single    Spouse Name: N/A  . Number of Children: N/A  . Years of Education: N/A   Occupational History  . Not on file.   Social History Main Topics  . Smoking status: Never Smoker   . Smokeless tobacco: Never Used  . Alcohol Use: No  . Drug Use: No  . Sexual Activity: Yes    Birth Control/ Protection: Surgical   Other Topics Concern  . Not on file   Social History Narrative   ** Merged History Encounter **        Past Surgical History  Procedure Laterality Date  . Cesarean section    . Tubal ligation    . Colonoscopy N/A 04/07/2014    Procedure: COLONOSCOPY;  Surgeon: Danie Binder, MD;  [facility];  Service: Endoscopy;  Laterality: N/A;  1130 - moved to 1/25 @ 11:00  . Esophagogastroduodenoscopy N/A 04/07/2014    Procedure: ESOPHAGOGASTRODUODENOSCOPY (EGD);  Surgeon: Danie Binder, MD;  [facility];  Service: Endoscopy;  Laterality: N/A;      (  Not in a hospital admission)  Review of systems complete and found to be negative unless listed above .  No nausea, vomiting.  No fever chills, No focal weakness,  No palpitations.  Physical Exam: Filed Vitals:   05/20/14 1049  BP: 100/62  Pulse: 75    Weight: 201 lb (91.173 kg)  Physical exam:  [place]/AT EOMI No JVD, No carotid bruit RRR S1S2  No wheezing Soft. NT, nondistended No edema. No focal motor or sensory deficits Normal affect  Labs:   Lab Results  Component Value Date   WBC 7.7 04/26/2014   HGB 13.9 05/03/2014   HCT 41.0 05/03/2014   MCV 78.9 04/26/2014   PLT 190 04/26/2014   No results for input(s): NA, K, CL, CO2, BUN, CREATININE, CALCIUM, PROT, BILITOT, ALKPHOS, ALT, AST, GLUCOSE in the last 168 hours.  Invalid input(s): LABALBU No results found for: CKTOTAL, CKMB, CKMBINDEX, TROPONINI Lab Results   Component Value Date   CHOL 148 12/05/2012   CHOL 117 12/02/2011   CHOL 150 07/23/2008   Lab Results  Component Value Date   HDL 41 12/05/2012   HDL 29* 12/02/2011   HDL 39* 07/23/2008   Lab Results  Component Value Date   LDLCALC 85 12/05/2012   LDLCALC 56 12/02/2011   LDLCALC 88 07/23/2008   Lab Results  Component Value Date   TRIG 112 12/05/2012   TRIG 161* 12/02/2011   TRIG 117 07/23/2008   Lab Results  Component Value Date   CHOLHDL 3.6 12/05/2012   CHOLHDL 4.0 12/02/2011   CHOLHDL 3.8 Ratio 07/23/2008   Lab Results  Component Value Date   LDLDIRECT 103* 05/26/2011      Radiology: EKG: NSR, normal ECG  ASSESSMENT AND PLAN:  CP: Some atypical features. Would not pursue invasive testing. We'll plan for stress echo to evaluate for ischemia. She thinks she can walk on the treadmill.  She needs aggressive risk factor modification including weight loss, glucose control and blood pressure control. LDL target less than 100 given her diabetes.  Edema:  Elevate legs at night to see if this helps the swelling. Signed:   Mina Marble, MD, [facility] 05/20/2014, 10:56 AM

## 2014-05-21 DIAGNOSIS — R600 Localized edema: Secondary | ICD-10-CM | POA: Insufficient documentation

## 2014-05-26 ENCOUNTER — Encounter: Payer: Self-pay | Admitting: Family Medicine

## 2014-05-26 ENCOUNTER — Ambulatory Visit (INDEPENDENT_AMBULATORY_CARE_PROVIDER_SITE_OTHER): Payer: No Typology Code available for payment source | Admitting: Family Medicine

## 2014-05-26 VITALS — BP 124/80 | HR 72 | Temp 97.9°F | Wt 205.0 lb

## 2014-05-26 DIAGNOSIS — R079 Chest pain, unspecified: Secondary | ICD-10-CM

## 2014-05-26 DIAGNOSIS — R300 Dysuria: Secondary | ICD-10-CM

## 2014-05-26 DIAGNOSIS — M546 Pain in thoracic spine: Secondary | ICD-10-CM

## 2014-05-26 DIAGNOSIS — E1149 Type 2 diabetes mellitus with other diabetic neurological complication: Secondary | ICD-10-CM

## 2014-05-26 LAB — POCT URINALYSIS DIPSTICK
Bilirubin, UA: NEGATIVE
KETONES UA: 15
Leukocytes, UA: NEGATIVE
Nitrite, UA: NEGATIVE
Protein, UA: NEGATIVE
RBC UA: NEGATIVE
SPEC GRAV UA: 1.015
Urobilinogen, UA: 0.2
pH, UA: 7

## 2014-05-26 LAB — POCT UA - MICROSCOPIC ONLY

## 2014-05-26 MED ORDER — NORTRIPTYLINE HCL 25 MG PO CAPS: 25.0000 mg | ORAL_CAPSULE | Freq: Every day | ORAL | Status: DC

## 2014-05-26 NOTE — Assessment & Plan Note (Signed)
Stable. Recently saw cardiology and they are planning for stress test. No pain at this time. Given return precautions.

## 2014-05-26 NOTE — Assessment & Plan Note (Signed)
Patient with chronic history of this issue. Previously trialed on prozac and ditropan with no improvement. UA with no signs of blood or infection. Likely blood patient mentioned was related to spotting from endometrial biopsy. Potentially urgency could be related to poorly controlled DM given >1000 glucose in her urine and this is causing her to fill her bladder to the extent that she has urgency. Will see if the nortriptyline provides any relief. If not could consider antimuscarinic for urgency incontinence vs referral to urology given persistent symptoms.  Precepted with Dr Ree Kida.

## 2014-05-26 NOTE — Patient Instructions (Signed)
Nice to see you. We are going to start you on nortriptyline for your leg pain. I think your increased urination is related to your diabetes. We need to try to get this under better control. Please bring your blood sugars to your next office visit and we can change your insulin at that time. Please do the exercises for your back. If you develop numbness, weakness, loss of bowel or bladder function, fever, or worsening back pain please seek medical attention.   Dolor de espalda, adultos  (Back Pain, Adult)  El dolor de espalda es frecuente. Con frecuencia mejora luego de algn tiempo. La causa suele no ser un peligro para la vida. La mayora de las personas aprende a controlarlo por sus propios medios.  CUIDADOS EN EL HOGAR   Mantngase fsicamente activo. Si puede, comience a dar cortas caminatas en un suelo plano. Trate de caminar un poco ms [place].  Nopermanezca sentado, de pie ni conduzca automviles durante ms de 30 minutos seguidos. Nose quede en la cama.  Noevite los ejercicios ni el trabajo. La actividad puede ayudar a que la espalda se cure ms rpido.  Tenga cuidado al inclinarse o al levantar un objeto. Doble las rodillas, mantenga el [place] cerca de su cuerpo y no gire.  Duerma sobre un [employer]. Acustese sobre un lado y [place]. Si se [employer], coloque una almohada debajo de las rodillas.  Tome la medicacin slo como le haya indicado el mdico.  Aplique hielo sobre la zona lesionada.  Ponga el hielo en una bolsa plstica.  Colquese una toalla entre la piel y la bolsa de hielo.  Deje la bolsa de hielo durante 15 a 32minutos 3 a 4 veces por da, durante los primeros 2 o 3 das. Luego puede ir alternando entre hielo y compresas calientes.  Consulte a su mdico sobre cul ejercicios o Proofreader.  Evite sentirse ansioso o estresado. Encuentre la forma de enfrentar el estrs, como por Museum/gallery curator ejercicios. SOLICITE AYUDA  DE INMEDIATO SI:   El dolor no desaparece aunque haga reposo o tome medicamentos para Conservation officer, historic buildings.  El dolor no desaparece en una semana.  Tiene nuevos problemas.  No se siente mejor.  El dolor se extiende a las piernas.  No puede controlar la orina o la materia fecal.  Siente que los brazos estn dbiles o pierde la sensibilidad (estn adormecidos).  Tiene Higher education careers adviser (nuseas) y vmitos.  Siente dolor abdominal.  Siente que se desvanece (se desmaya). ASEGRESE DE QUE:   Comprende estas instrucciones.  Controlar su enfermedad.  Solicitar ayuda de inmediato si no mejora o si empeora. Document Released: 09/13/2010 Document Revised: 05/23/2011 [facility] Patient Information 2015 [place]. This information is not intended to replace advice given to you by your health care provider. Make sure you discuss any questions you have with your health care provider.

## 2014-05-26 NOTE — Progress Notes (Signed)
Patient ID: [name], female   DOB: [birth year], 40 y.o.   MRN: [id number]  [name], MD Phone: [phone]  [name] is a 39 y.o. female who presents today for f/u.  Diabetic neuropathy: notes burning pain in her feet is the same as previously. She tried the gabapentin though notes it made "my veins swell up and the pain worse." So she stopped this medication and the pain returned to her baseline. Mostly burning is in her feet though some times is her whole leg. She notes pain can be there when she is laying down or when she is walking. No decreased sensation.   Back pain: right mid back with tightness that is worse when she walks. Has had this for a number of years. No saddle anesthesia, bowel or bladder function changes, fever, history of cancer, numbness, or weakness.   Urinary urgency: patient notes that she has had some urgency in the past. None at this time. No increased frequency at this time, though some in the past. Some dysuria and suprapubic discomfort in the past as well. Notes she had 4 days of spotting after having endometrial biopsy and on the 4th day she notes some discomfort with urination and then wiped and noted some blood. No hematuria in the past. No hematuria since that time. She is unsure if this came from her vagina or urine.   Chest pain: patient saw cardiology last week and they are planning for stress test. No CP at this time, though intermittently still has this.   Patient is a nonsmoker.   ROS: Per HPI   Physical Exam Filed Vitals:   05/26/14 1613  BP: 124/80  Pulse: 72  Temp: 97.9 F (36.6 C)    Gen: Well NAD Lungs: CTABL Nl WOB Heart: RRR  Abd: soft, NT, ND MSK: no midline spine tenderness, there is tenderness in the right lower thoracic paraspinous muscles Neuro: 5/5 strength in bilateral quads, hamstrings, plantar and dorsiflexion, sensation to light touch intact in bilateral LE, normal gait, 2+ patellar  reflexes Monofilament testing normal Exts: Non edematous BL  LE, warm and well perfused.    Assessment/Plan: Please see individual problem list.  [name], MD [place] PGY-3

## 2014-05-26 NOTE — Assessment & Plan Note (Signed)
Patient with what sounds like neuropathic pain with burning sensation in bilateral feet. Unlikely related to neurogenic claudication or true claudication given symptoms can happen at any time. No swelling of legs or calf tenderness to indicate DVT. Did not tolerate gabapentin. Will trial nortriptyline 25 mg qhs for this. Will have the patient come back in 2-3 weeks for DM f/u as this is the likely causal factor of this issue.

## 2014-05-26 NOTE — Assessment & Plan Note (Signed)
Likely MSK in nature given physical exam findings. No red flags and neurologically intact in LE. Provided exercises to perform. Needs to lose weight. Given return precautions.

## 2014-05-30 ENCOUNTER — Ambulatory Visit (HOSPITAL_COMMUNITY): Payer: No Typology Code available for payment source

## 2014-06-02 ENCOUNTER — Ambulatory Visit (HOSPITAL_COMMUNITY): Payer: No Typology Code available for payment source | Attending: Cardiology

## 2014-06-02 ENCOUNTER — Encounter: Payer: Self-pay | Admitting: *Deleted

## 2014-06-02 ENCOUNTER — Telehealth: Payer: Self-pay | Admitting: *Deleted

## 2014-06-02 ENCOUNTER — Ambulatory Visit (HOSPITAL_BASED_OUTPATIENT_CLINIC_OR_DEPARTMENT_OTHER): Payer: No Typology Code available for payment source | Admitting: Cardiology

## 2014-06-02 DIAGNOSIS — E118 Type 2 diabetes mellitus with unspecified complications: Secondary | ICD-10-CM

## 2014-06-02 DIAGNOSIS — R0789 Other chest pain: Secondary | ICD-10-CM | POA: Insufficient documentation

## 2014-06-02 DIAGNOSIS — R079 Chest pain, unspecified: Secondary | ICD-10-CM

## 2014-06-02 DIAGNOSIS — R0602 Shortness of breath: Secondary | ICD-10-CM | POA: Insufficient documentation

## 2014-06-02 DIAGNOSIS — R0989 Other specified symptoms and signs involving the circulatory and respiratory systems: Secondary | ICD-10-CM

## 2014-06-02 NOTE — Telephone Encounter (Signed)
-----   Message from Leone Haven, MD sent at 06/02/2014 12:17 AM EDT ----- Please inform the patient that her endometrial biopsy had no abnormal cells. Will continue to monitor for further bleeding. Thanks.

## 2014-06-02 NOTE — Telephone Encounter (Signed)
Letter mailed using google translate.  Temari Schooler,CMA

## 2014-06-02 NOTE — Progress Notes (Signed)
Echo performed. 

## 2014-06-04 ENCOUNTER — Ambulatory Visit: Payer: No Typology Code available for payment source

## 2014-06-27 ENCOUNTER — Ambulatory Visit: Payer: No Typology Code available for payment source | Admitting: Family Medicine

## 2014-07-21 NOTE — Progress Notes (Signed)
REVIEWED. She had HYPERPLASTIC POLYPS removed. Her stomach Bx showed H. Pylori infection. she needs to complete HER ABX FOR HER VAGINAL INFECTION AND THEN BE TREATED FOR THE STOMACH INFECTION. SHE WILL NEED TO TAKE ALL THE ABX FOR H PYLORI BECAUSE IF LEFT UNTREATED, IT CAN LEAD TO STOMACH CANCER.

## 2014-08-06 ENCOUNTER — Telehealth: Payer: Self-pay | Admitting: Nurse Practitioner

## 2014-08-06 ENCOUNTER — Ambulatory Visit: Payer: No Typology Code available for payment source | Admitting: Nurse Practitioner

## 2014-08-06 NOTE — Telephone Encounter (Signed)
Pt was a no show

## 2014-08-06 NOTE — Telephone Encounter (Signed)
Noted  

## 2014-09-16 ENCOUNTER — Other Ambulatory Visit: Payer: Self-pay | Admitting: *Deleted

## 2014-09-19 NOTE — Telephone Encounter (Signed)
Have tried to call [name] multiple times to refill medication and no answer or call back. Will continue to try to get through, but she may need to switch to a different pharmacy. Could we call her and see if she still wants her Metformin filled there? Thanks  CGM MD

## 2014-09-22 NOTE — Telephone Encounter (Signed)
[facility]. Jazmin Hartsell,CMA

## 2014-09-23 MED ORDER — METFORMIN HCL 1000 MG PO TABS: 1000.0000 mg | ORAL_TABLET | Freq: Two times a day (BID) | ORAL | Status: DC

## 2014-10-16 ENCOUNTER — Ambulatory Visit: Payer: Self-pay | Admitting: Family Medicine

## 2014-10-16 ENCOUNTER — Encounter: Payer: Self-pay | Admitting: Family Medicine

## 2014-10-16 ENCOUNTER — Ambulatory Visit (INDEPENDENT_AMBULATORY_CARE_PROVIDER_SITE_OTHER): Payer: Self-pay | Admitting: Family Medicine

## 2014-10-16 VITALS — BP 128/69 | HR 81 | Temp 98.0°F | Wt 207.0 lb

## 2014-10-16 DIAGNOSIS — IMO0002 Reserved for concepts with insufficient information to code with codable children: Secondary | ICD-10-CM

## 2014-10-16 DIAGNOSIS — R300 Dysuria: Secondary | ICD-10-CM

## 2014-10-16 DIAGNOSIS — E1165 Type 2 diabetes mellitus with hyperglycemia: Secondary | ICD-10-CM

## 2014-10-16 LAB — POCT URINALYSIS DIPSTICK
Bilirubin, UA: NEGATIVE
Glucose, UA: 500
Leukocytes, UA: NEGATIVE
Nitrite, UA: NEGATIVE
PH UA: 7
Protein, UA: NEGATIVE
RBC UA: NEGATIVE
SPEC GRAV UA: 1.02
Urobilinogen, UA: 0.2

## 2014-10-16 LAB — POCT GLYCOSYLATED HEMOGLOBIN (HGB A1C): HEMOGLOBIN A1C: 10.2

## 2014-10-16 MED ORDER — NORTRIPTYLINE HCL 25 MG PO CAPS: 25.0000 mg | ORAL_CAPSULE | Freq: Every day | ORAL | Status: DC

## 2014-10-16 MED ORDER — METFORMIN HCL 1000 MG PO TABS: 1000.0000 mg | ORAL_TABLET | Freq: Two times a day (BID) | ORAL | Status: DC

## 2014-10-16 NOTE — Progress Notes (Signed)
Patient ID: [name], female   DOB: 1975-03-20, 39 y.o.   MRN: [id number]   [facility] Aquilla Hacker, MD Phone: [phone]  Subjective:   # Dysuria - This is a chronic problem for her related to her DMII and is worse whenever she has poor control.  - She says she has been having increased frequency, urgency, pylydypsia, and polyphagia.  - She has not had any nausea, vomiting, fever, chills, hematuria.  - She has not had any abnormal discharge.  - She says that her pain is not vaginal, but rather it is more suprapubic. - Her pain was previously relieved by taking nortryptilene.  - She says that she has since run out of this medication.  - She denies abdominal pain, or back pain.   # DMII - Pt. With chronically uncontrolled DMII - She says that her glucose typically runs 150-170 at home, but her A1C's are not reflective of this.  - She does admit to running out of her metformin and she has not been taking the prescribed dose since she was able to find an old bottle and was just taking that.  - She says she does take 70/30 daily 8 units in the am and 10 units in the pm which is less than she was prescribed.  - She has not been on any other medications for DMII - She has no vision changes or peripheral numbness.   All relevant systems were reviewed and were negative unless otherwise noted in the HPI  Past Medical History Reviewed problem list.  Medications- reviewed and updated Current Outpatient Prescriptions  Medication Sig Dispense Refill  . insulin NPH-regular Human (NOVOLIN 70/30) (70-30) 100 UNIT/ML injection Inject 10-11 Units into the skin 2 (two) times daily with a meal. 11u with breakfast and 10u with dinner 10 mL 3  . metFORMIN (GLUCOPHAGE) 1000 MG tablet Take 1 tablet (1,000 mg total) by mouth 2 (two) times daily with a meal. 60 tablet 11  . nortriptyline (PAMELOR) 25 MG capsule Take 1 capsule (25 mg total) by mouth at bedtime. 30  capsule 2  . omeprazole (PRILOSEC) 20 MG capsule Take 1 capsule (20 mg total) by mouth 2 (two) times daily before a meal. 60 capsule 11  . ranitidine (ZANTAC) 150 MG tablet Take 1 tablet (150 mg total) by mouth 2 (two) times daily as needed for heartburn. (Patient taking differently: Take 150 mg by mouth daily. ) 60 tablet 1   No current facility-administered medications for this visit.   Chief complaint-noted No additions to family history Social history- patient is a non smoker  Objective: BP 128/69 mmHg  Pulse 81  Temp(Src) 98 F (36.7 C) (Oral)  Wt 207 lb (93.895 kg)  LMP 09/19/2014 (Approximate) Gen: NAD, alert, cooperative with exam HEENT: NCAT, EOMI, PERRL Neck: FROM, supple CV: RRR, good S1/S2, no murmur Resp: CTABL, no wheezes, non-labored Abd: SNTND, BS present, no guarding or organomegaly, some mild suprapubic tenderness to exam, but otherwise normal. No CVA tenderness.  Ext: No edema, warm, normal tone, moves UE/LE spontaneously Neuro: Alert and oriented, No gross deficits Skin: no rashes no lesions  Assessment/Plan:  # Dysuria - this is a chronic problem for her U/A here today was negative and only positive for glucosuria which she has had in the past. She has polydypsia and polyuria. She was given nortryptilene which she says helped her symptoms in the past. She otherwise has no signs of infection at this time. No other  concern for kidney stone at this time.  - Discussed with patient.  - Needs better glucose control.  - Nortryptilene refilled - Drink plenty of fluids.  - Return in one week for DMII follow up with me.  - Will adjust her DMII meds then.   # DMII  - Pt. With uncontrolled DMII. a1C today is 10.2 though she says that her glucose runs in the 150's. She needs titration of her medication. She has polyuria and polydypsia.  - Will schedule for follow up next week to consider adjusting her insulin or adding something like Glyburide on with metformin.  -  Needs DMII eye exam and foot exam.  - Will see her next week.  - Metformin refilled in the mean time.

## 2014-10-16 NOTE — Patient Instructions (Addendum)
Thanks for coming in today.   We will refill the nortryptilene which helped with your dysuria previously.   We will also have you come back in 1-2 weeks to discuss your diabetes.   If you have a urinary tract infection we will call in antibiotics for you.   Be sure to work on your diet and exercise.   Thanks for letting us take care of you!  Sincerely,  Paula Compton, MD Family Medicine - PGY 2   Diet Recommendations for Diabetes   Starchy (carb) foods: Bread, rice, pasta, potatoes, corn, crackers, bagels, muffins, all baked goods.  (Fruits, milk, and yogurt also have carbohydrate, but most of these foods will not spike your blood sugar as the starchy foods will.)  A few fruits do cause high blood sugars; use small portions of bananas (limit to 1/2 at a time), grapes, watermelon, and most tropical fruits.    Protein foods: Meat, fish, poultry, eggs, dairy foods, and beans such as pinto and kidney beans (beans also provide carbohydrate).   1. Eat at least 3 meals and 1-2 snacks per day. Never go more than 4-5 hours while awake without eating. Eat breakfast within the first hour of getting up.   2. Limit starchy foods to TWO per meal and ONE per snack. ONE portion of a starchy  food is equal to the following:   - ONE slice of bread (or its equivalent, such as half of a hamburger bun).   - 1/2 cup of a "scoopable" starchy food such as potatoes or rice.   - 15 grams of carbohydrate as shown on food label.  3. Include at every meal: a protein food, a carb food, and vegetables and/or fruit.   - Obtain twice the volume of veg's as protein or carbohydrate foods for both lunch and dinner.   - Fresh or frozen veg's are best.   - Keep frozen veg's on hand for a quick vegetable serving.       Disuria (Dysuria) Es el trmino que se aplica al trastorno de dolor al [employer]. Hay muchas causas de disuria, pero la ms frecuente es la infeccin del tracto urinario. Un anlisis de [place] puede  confirmar si tiene una infeccin. Un cultivo de [place] 2 y 3 das. El cultivo de [place] confirma que usted o el nio estn enfermos. Deber concurrir a una visita de control debido a que:  Si le realizaron un cultivo, Civil Service fast streamer y las recomendaciones para Dispensing optician.  Si el cultivo de [place] fue positivo, deber tomar antibiticos o conocer si los antibiticos que le han prescripto son los correctos para su tipo de infeccin.  Si el cultivo es negativo (no hay infeccin del tracto urinario, debern buscar otras causas o habr que suspender los antibiticos. Puede ser que en el da de hoy le hayan hecho anlisis de laboratorio y no se haya hallado infeccin. Si se realizaron Chief of Staff 24 y 2 horas en [employer]. Puede ser que en el da de hoy le hayan tomado radiografas cuyo resultado es normal. No se ha hallado la causa del problema. Las radiografas sern reledas por un radilogo, que se comunicar con usted si encuentra algn resultado adicional. Puede ser que en el da de hoy a usted o a su nio le hayan indicado medicamentos para ayudarlo con su problema hasta que vea al mdico de cabecera. Si mejora, podr consultar con su mdico de cabecera si reapareciera. Si se le han  administrado antibiticos (medicamentos que destruyen los grmenes), tmelos como se le han indicado Animator. Si se le han realizado anlisis de Designer, fashion/clothing, Careers information officer. Deje un nmero telefnico para poder contactarlo. Si esto no es posible, averige cmo debe [employer]. INSTRUCCIONES PARA EL CUIDADO DOMICILIARIO  Beba gran cantidad de lquidos. Para adultos, beba 8 vasos de agua o jugo por [employer]. Para nios, reponga los lquidos como le indique su mdico.  Vacie la vejiga con frecuencia. Evite retener la orina durante largos perodos.  Despus de [employer], las mujeres deben limpiarse desde adelante  hacia atrs, usando el papel higinico slo una vez.  Vace la vejiga antes y despus de Clinical biochemist.  Tome todos los medicamentos que le han recetado hasta que la infeccin haya desaparecido. Se sentir mejor en [employer], pero debe tomar TODOS LOS MEDICAMENTOS. Si se le ha administrado Pyridium, problemente la orina sea de un color oscuro. Esto puede hacer que su ropa interior se manche por lo que deber Risk manager una Psychologist, sport and exercise.  Evite la cafena, el t, el alcohol y las bebidas carbonatadas, debido a que tienden a Medical illustrator.  En los hombres, el alcohol puede irritar la prstata.  Utilice los medicamentos de venta libre o de prescripcin para Conservation officer, historic buildings, Health and safety inspector o la [place], segn se lo indique el profesional que lo asiste.  Si el profesional que lo [employer] pide que concurra a una cita de seguimiento, es importante asistir a ella. No concurrir a la Multimedia programmer como consecuencia una lesin crnica o [place], dolor, e incapacidad. Si tiene algn problema para asistir a la cita, debe comunicarse con el establecimiento para obtener asistencia. SOLICITE ATENCIN MDICA DE INMEDIATO SI:  Siente dolor en la espalda.  Sube la fiebre.  Si tiene nuseas (ganas de vomitar) o vmitos.  Si el problema no mejora con los medicamentos o [place]. EST SEGURO QUE:  Comprende las instrucciones para el alta mdica.  Controlar su enfermedad.  Solicitar atencin mdica de inmediato segn las indicaciones. Document Released: 03/20/2007 Document Revised: 05/23/2011 [facility] Patient Information 2015 [place]. This information is not intended to replace advice given to you by your health care provider. Make sure you discuss any questions you have with your health care provider.

## 2014-10-30 ENCOUNTER — Ambulatory Visit (INDEPENDENT_AMBULATORY_CARE_PROVIDER_SITE_OTHER): Payer: Self-pay | Admitting: Family Medicine

## 2014-10-30 ENCOUNTER — Encounter: Payer: Self-pay | Admitting: Family Medicine

## 2014-10-30 VITALS — BP 133/58 | HR 77 | Temp 98.2°F | Ht 59.0 in | Wt 205.7 lb

## 2014-10-30 DIAGNOSIS — N939 Abnormal uterine and vaginal bleeding, unspecified: Secondary | ICD-10-CM

## 2014-10-30 DIAGNOSIS — E119 Type 2 diabetes mellitus without complications: Secondary | ICD-10-CM

## 2014-10-30 MED ORDER — LEVONORGEST-ETH ESTRAD 91-DAY 0.15-0.03 &0.01 MG PO TABS: 1.0000 | ORAL_TABLET | Freq: Every day | ORAL | Status: DC

## 2014-10-30 MED ORDER — GLYBURIDE 5 MG PO TABS: 5.0000 mg | ORAL_TABLET | Freq: Two times a day (BID) | ORAL | Status: DC

## 2014-10-30 NOTE — Assessment & Plan Note (Signed)
Pt. With abnormal uterine bleeding ongoing for > 7 years. Ultrasound done 04/2014 without evidence of mass, fibroids, or thickened endometrial stripe. She says that her sisters who were all between 30-40 had to have hysterectomy due to uterine fibroids and one sister who she says she thinks goes to the cancer center for treatment. No other family history of early cancer. She has not had any weight loss, gain, or appetite changes. She would like to try pharmacotherapy to help with her menstrual symptoms.   -  Will start extended therapy course to help with her symptoms.  - Started 0.15-0.03&0.01 Levonorgestrel-Ethinyl Estradiol pill.  - will follow up in 1 month to see how her symptoms are.  - No shortness of breath or evidence of symptomatic anemia at this time.

## 2014-10-30 NOTE — Patient Instructions (Signed)
Thanks for coming in today.   1. We started you on an oral contraceptive. This should help to reduce your irregular bleeding. If this does not help then we will discuss next steps at your follow up in one month.   2. We have switched your diabetes regimen to glyburide. STOP taking Insulin. We will see how your glucose responds over the next month, and we will adjust your medication from there.   3. If you have any questions or issues don't hesitate to call.    Thanks for letting us take care of you  Sincerely,  Mary Compton, MD Family Medicine - PGY 2  Control del nivel de glucosa en la sangre (Blood Glucose Monitoring) El control de la glucosa en la sangre (tambin llamada azcar en la sangre) lo ayudar a tener la diabetes bajo control. Tambin ayuda a que usted y Chief Financial Officer la diabetes y determinen si el tratamiento es Armed forces logistics/support/administrative officer. POR QU HAY QUE CONTROLAR LA GLUCOSA EN LA SANGRE?  Esto puede ayudar a comprender de [employer], la actividad fsica y los medicamentos inciden en los niveles de [place].  Le permite conocer el nivel de glucosa en la sangre en cualquier momento dado. Puede saber rpidamente si el nivel es bajo (hipoglucemia) o alto (hiperglucemia).  Puede ser de ayuda para que usted y el mdico sepan cmo Water engineer,  y para entender cmo controlar una enfermedad o ajustar los medicamentos para hacer ejercicio. CUNDO DEBE HACERSE LAS PRUEBAS? El mdico lo ayudar a decidir con qu frecuencia deber [employer] niveles de glucosa en la [place]. Esto puede depender del tipo de diabetes que tenga, su control de la diabetes o los tipos de medicamentos que tome. Asegrese de anotar todos los valores de la glucosa en la [place], de modo que esta informacin pueda ser revisada por su mdico. A continuacin puede ver ejemplos de los momentos para Optometrist la prueba que el mdico puede Event organiser. Diabetes tipo1  [employer] prueba 4 veces por da  si est bien controlado, [place] una bomba de [place] o se aplica muchas inyecciones diarias.  Si la diabetes no est bien controlada o si est enfermo, puede ser necesario que se controle con ms frecuencia.  Es recomendable que tambin se controle de [place] modo:  Antes y despus de hacer ejercicio.  [place] comidas y 2horas despus de Scientist, research (physical sciences).  Ocasionalmente, entre las 2:00a.m. y las 3:00a.m. Diabetes tipo2  Puede ser diferente para cada persona, pero, en general, si recibe insulina, hgase la prueba 4veces por da.  Si toma medicamentos por boca (va oral), hgase la prueba 2veces por da.  Si sigue una dieta controlada, hgase la prueba una vez por da.  Si la diabetes no est bien controlada o si est enfermo, puede ser necesario que se controle con ms frecuencia. CMO CONTROLAR EL NIVEL DE GLUCOSA EN LA SANGRE Insumos necesarios  Medidor de glucosa en la sangre.  Tiras reactivas para el medidor. Cada medidor tiene sus propias tiras reactivas. [place] tiras reactivas correspondientes a su medidor.  Una aguja para pinchar (lanceta).  Un dispositivo que sujeta la lanceta (dispositivo de puncin).  Un diario o libro de anotaciones para [employer]. Procedimiento  Lave sus manos con agua y [place]. No se recomienda usar alcohol.  Pnchese el costado del dedo (no la punta) con Retail buyer.  Apriete suavemente el dedo hasta que aparezca una pequea gota de [place].  Siga las instrucciones que vienen con  el medidor para Event organiser, Midwife la sangre sobre la tira y usar el medidor de Printmaker. Otras zonas de las que se puede tomar sangre para la prueba Algunos medidores le permiten tomar sangre para la prueba de otras zonas del cuerpo (que no son el dedo). Estas reas se llaman sitios alternativos. Los sitios alternativos ms comunes son los siguientes:  El Management consultant.  El muslo.  La zona posterior de la parte inferior de la  pierna.  La palma de la mano. El flujo de sangre en estas zonas es ms lento. Por lo tanto, los valores de glucosa en la sangre que obtenga pueden estar demorados, y los nmeros son diferentes de los que obtiene de los dedos. No saque sangre de sitios alternativos si cree que tiene hipoglucemia. Los valores no sern precisos. Siempre extraiga del dedo si tiene hipoglucemia. Adems, si no puede darse cuenta cuando tiene bajos los niveles (hipoglucemia asintomtica), siempre extraiga sangre de los dedos para los controles de glucosa en la [place]. CONSEJOS ADICIONALES PARA EL CONTROL DE LA GLUCOSA  No vuelva a [place] lancetas.  Siempre tenga los insumos a mano.  Todos los medidores de glucosa incluyen un nmero de telfono "directo", disponible las 24 horas, al que podr llamar si tiene preguntas o [place].  Ajuste (calibre) el medidor de glucosa con una solucin de control despus de terminar algunas cajas de tiras reactivas. LLEVE REGISTROS DE LOS NIVELES DE GLUCOSA EN LA SANGRE Es recomendable llevar un diario o un registro de los valores de glucosa en la [place]. La [employer] de los medidores de glucosa, sino todos, conservan el registro de la glucosa en el dispositivo. Algunos medidores permiten descargar los registros a su computadora. Llevar un registro de los valores de glucosa en la sangre es especialmente til si desea observar los patrones. Haga anotaciones simultneas con la Teacher, English as a foreign language de los valores de glucosa en la sangre debido a que podra olvidar lo que ocurri en el momento exacto. Llevar un buen registro los ayudar a usted y al mdico a Fish farm manager juntos para Scientist, forensic un buen control de la diabetes.  Document Released: 02/28/2005 Document Revised: 07/15/2013 [facility] Patient Information 2015 [place], [place]. This information is not intended to replace advice given to you by your health care provider. Make sure you discuss any questions you have with your health care  provider.  La diabetes mellitus y los alimentos (Diabetes Mellitus and Food) Es importante que controle su nivel de azcar en la sangre (glucosa). El nivel de glucosa en sangre depende en gran medida de lo que usted come. [place] alimentos saludables en las cantidades [place] a lo largo del Training and development officer, aproximadamente a la misma hora [employer], lo ayudar a Chief Technology Officer su nivel de Multimedia programmer. Tambin puede ayudarlo a retrasar o Patent attorney de la diabetes mellitus. Comer de [employer] saludable incluso puede ayudarlo a Chartered loss adjuster de presin arterial y a Science writer o Theatre manager un peso saludable.  CMO PUEDEN AFECTARME LOS ALIMENTOS? Carbohidratos Los carbohidratos afectan el nivel de glucosa en sangre ms que cualquier otro tipo de alimento. El nutricionista lo ayudar a Teacher, adult education cuntos carbohidratos puede consumir en cada comida y ensearle a contarlos. El recuento de carbohidratos es importante para mantener la glucosa en sangre en un nivel saludable, en especial si utiliza insulina o toma determinados medicamentos para la diabetes mellitus. Alcohol El alcohol puede provocar disminuciones sbitas de la glucosa en sangre (hipoglucemia), en especial si utiliza insulina o  toma determinados medicamentos para la diabetes mellitus. La hipoglucemia es una afeccin que puede poner en peligro la vida. Los sntomas de la hipoglucemia (somnolencia, mareos y Data processing manager) son similares a los sntomas de haber consumido mucho alcohol.  Si el mdico lo autoriza a beber alcohol, hgalo con moderacin y siga estas pautas:  Las mujeres no deben beber ms de un trago por da, y los hombres no deben beber ms de dos tragos por Training and development officer. Un trago es igual a:  12 onzas (355 ml) de cerveza  5 onzas de vino (150 ml) de vino  1,5onzas (45ml) de bebidas espirituosas  No beba con el estmago vaco.  Mantngase hidratado. Beba agua, gaseosas dietticas o t helado sin azcar.  Las gaseosas comunes, los  jugos y otros refrescos podran contener muchos carbohidratos y se Civil Service fast streamer. QU ALIMENTOS NO SE RECOMIENDAN? Cuando haga las elecciones de alimentos, es importante que recuerde que todos los alimentos son distintos. Algunos tienen menos nutrientes que otros por porcin, aunque podran tener la misma cantidad de caloras o carbohidratos. Es difcil darle al cuerpo lo que necesita cuando consume alimentos con menos nutrientes. Estos son algunos ejemplos de alimentos que debera evitar ya que contienen muchas caloras y carbohidratos, pero pocos nutrientes:  Physicist, medical trans (la mayora de los alimentos procesados incluyen grasas trans en la etiqueta de Informacin nutricional).  Gaseosas comunes.  Jugos.  Caramelos.  Dulces, como tortas, pasteles, rosquillas y [place].  Comidas fritas. QU ALIMENTOS PUEDO COMER? Consuma alimentos ricos en nutrientes, que nutrirn el cuerpo y lo mantendrn saludable. Los alimentos que debe comer tambin dependern de varios factores, como:  Las caloras que necesita.  Los medicamentos que toma.  Su peso.  El nivel de glucosa en [place].  El [place] de presin arterial.  El nivel de colesterol. Tambin debe consumir una variedad de [place], como:  Protenas, como carne, aves, pescado, tofu, frutos secos y semillas (las protenas de [place] magros son mejores).  Lambert Mody.  Verduras.  Productos lcteos, como [place], queso y yogur (descremados son mejores).  Panes, granos, pastas, cereales, arroz y frijoles.  Grasas, como aceite de [place], [place] sin grasas trans, aceite de canola, aguacate y [place]. TODOS LOS QUE PADECEN DIABETES MELLITUS TIENEN EL [place] PLAN DE [place]? Dado que todas las personas que padecen diabetes mellitus son distintas, no hay un solo plan de comidas que funcione para todos. Es muy importante que se rena con un nutricionista que lo ayudar a crear un plan de comidas adecuado para usted. Document Released:  06/07/2007 Document Revised: 03/05/2013 [facility] Patient Information 2015 [place]. This information is not intended to replace advice given to you by your health care provider. Make sure you discuss any questions you have with your health care provider.

## 2014-10-30 NOTE — Progress Notes (Signed)
Patient ID: [name], female   DOB: 08/11/1975, 39 y.o.   MRN: [id number]   [facility] Aquilla Hacker, MD Phone: [phone]  Subjective:   # DM II Follow up  - She continues to say that her glucose runs in the 150's / 190's in the am.  - She says she does not check it often at night or in the afternoon.  - polyuria and polydypsia last month. She has had resolution of her dysuria with the elavil.  - She says that she is taking 8 units of insulin in the morning and 10 at night. She does not miss doses.  - She has been taking the metformin as prescribed.  - She says that she tries to eat better.  - she would like to go back on the oral medication glyburide for her diabetes. She was on this before and says she felt much better with it.  - No vision changes, no peripheral neuropathy at this time.   # Irregular periods - Pt. Did bring up that she has been having irregular periods for nearly 7 years now.  - She underwent an evaluation earlier this year with ultrasound performed without evidence of fibroids, or thickened endometrial stripe.  - this evaluation was performed in 04/2014 and she has continued to have bleeding since.  - She does not feel that her bleeding is heavy. She does not have nay shortness of breath. She does feel chronically fatigued.  - She says she has some significant pain around her normal period time every month, but other than that she spots / bleeds in between nearly every day or every other day.  - It has been this way since after her last son was born 26 years ago.  - Interested in trying hormonal suppression for her periods.   All relevant systems were reviewed and were negative unless otherwise noted in the HPI  Past Medical History Reviewed problem list.  Medications- reviewed and updated Current Outpatient Prescriptions  Medication Sig Dispense Refill  . glyBURIDE (DIABETA) 5 MG tablet Take 1 tablet (5 mg total) by mouth 2  (two) times daily with a meal. 60 tablet 4  . Levonorgestrel-Ethinyl Estradiol ([name],[name]) 0.15-0.03 &0.01 MG tablet Take 1 tablet by mouth daily. 1 Package 4  . metFORMIN (GLUCOPHAGE) 1000 MG tablet Take 1 tablet (1,000 mg total) by mouth 2 (two) times daily with a meal. 60 tablet 11  . nortriptyline (PAMELOR) 25 MG capsule Take 1 capsule (25 mg total) by mouth at bedtime. 30 capsule 2  . omeprazole (PRILOSEC) 20 MG capsule Take 1 capsule (20 mg total) by mouth 2 (two) times daily before a meal. 60 capsule 11  . ranitidine (ZANTAC) 150 MG tablet Take 1 tablet (150 mg total) by mouth 2 (two) times daily as needed for heartburn. (Patient taking differently: Take 150 mg by mouth daily. ) 60 tablet 1   No current facility-administered medications for this visit.   Chief complaint-noted No additions to family history Social history- patient is a non smoker  Objective: BP 133/58 mmHg  Pulse 77  Temp(Src) 98.2 F (36.8 C) (Oral)  Ht 4\' 11"  (1.499 m)  Wt 205 lb 11.2 oz (93.305 kg)  BMI 41.52 kg/m2  LMP 09/19/2014 (Approximate) Gen: NAD, alert, cooperative with exam HEENT: NCAT, EOMI, PERRL Neck: FROM, supple CV: RRR, good K5/L9, 7-6/7 systolic flow murmur.  Resp: CTABL, no wheezes, non-labored Abd: SNTND, BS present, no guarding or organomegaly Ext: No edema,  warm, normal tone, moves UE/LE spontaneously Neuro: Alert and oriented, No gross deficits Skin: no rashes no lesions  Assessment/Plan: See problem based a/p

## 2014-10-30 NOTE — Assessment & Plan Note (Signed)
She continues to report glucose in the 150's - 190's which is exactly what she reported last time with A1C of 10.2 indicating much higher average glucose than this. She says she is not having hypoglycemia. She does not like sticking herself for the insulin, though she says that she has been compliant with her insulin. Will switch back to Glyburide for her and add on to this as indicated.   - Follow up in 1 month to see what blood glucose is doing.  - discussed diet - Switched to 5mg  Glyburide BID AC.  - Will add to this regimen if needed.

## 2014-11-27 ENCOUNTER — Telehealth: Payer: Self-pay | Admitting: *Deleted

## 2014-11-27 NOTE — Telephone Encounter (Signed)
[name] from [facility] called stating they don't carry glyburide or [name],[name].  They carry glipizide 5 mg and Previfem, [name].  Please consider changing medications.  Please call with questions [phone]. Derl Barrow, RN

## 2014-12-03 ENCOUNTER — Ambulatory Visit: Payer: Self-pay | Admitting: Family Medicine

## 2014-12-03 MED ORDER — GLIPIZIDE 5 MG PO TABS: 5.0000 mg | ORAL_TABLET | Freq: Two times a day (BID) | ORAL | Status: DC

## 2014-12-03 MED ORDER — NORGESTIM-ETH ESTRAD TRIPHASIC 0.18/0.215/0.25 MG-35 MCG PO TABS: 1.0000 | ORAL_TABLET | Freq: Every day | ORAL | Status: DC

## 2014-12-03 NOTE — Telephone Encounter (Signed)
Changed rx  CGM MD

## 2014-12-03 NOTE — Addendum Note (Signed)
Addended by: Aquilla Hacker on: 12/03/2014 08:35 AM   Modules accepted: Orders, Medications

## 2015-01-01 ENCOUNTER — Ambulatory Visit: Payer: Self-pay

## 2015-04-09 ENCOUNTER — Encounter (HOSPITAL_COMMUNITY): Payer: Self-pay

## 2015-04-09 ENCOUNTER — Observation Stay (HOSPITAL_COMMUNITY)
Admission: EM | Admit: 2015-04-09 | Discharge: 2015-04-12 | Disposition: A | Discharge status: Home / Self Care | Payer: Medicaid Other | Attending: Surgery | Admitting: Surgery

## 2015-04-09 DIAGNOSIS — K219 Gastro-esophageal reflux disease without esophagitis: Secondary | ICD-10-CM | POA: Diagnosis not present

## 2015-04-09 DIAGNOSIS — Z7984 Long term (current) use of oral hypoglycemic drugs: Secondary | ICD-10-CM | POA: Insufficient documentation

## 2015-04-09 DIAGNOSIS — G8929 Other chronic pain: Secondary | ICD-10-CM | POA: Diagnosis not present

## 2015-04-09 DIAGNOSIS — R102 Pelvic and perineal pain: Secondary | ICD-10-CM | POA: Insufficient documentation

## 2015-04-09 DIAGNOSIS — E1142 Type 2 diabetes mellitus with diabetic polyneuropathy: Secondary | ICD-10-CM | POA: Insufficient documentation

## 2015-04-09 DIAGNOSIS — F329 Major depressive disorder, single episode, unspecified: Secondary | ICD-10-CM | POA: Diagnosis not present

## 2015-04-09 DIAGNOSIS — K8012 Calculus of gallbladder with acute and chronic cholecystitis without obstruction: Principal | ICD-10-CM | POA: Insufficient documentation

## 2015-04-09 DIAGNOSIS — K8020 Calculus of gallbladder without cholecystitis without obstruction: Secondary | ICD-10-CM | POA: Diagnosis present

## 2015-04-09 DIAGNOSIS — Z793 Long term (current) use of hormonal contraceptives: Secondary | ICD-10-CM | POA: Insufficient documentation

## 2015-04-09 DIAGNOSIS — Z79899 Other long term (current) drug therapy: Secondary | ICD-10-CM | POA: Diagnosis not present

## 2015-04-09 DIAGNOSIS — I10 Essential (primary) hypertension: Secondary | ICD-10-CM | POA: Insufficient documentation

## 2015-04-09 DIAGNOSIS — R1011 Right upper quadrant pain: Secondary | ICD-10-CM | POA: Diagnosis present

## 2015-04-09 LAB — URINALYSIS, ROUTINE W REFLEX MICROSCOPIC
Bilirubin Urine: NEGATIVE
Hgb urine dipstick: NEGATIVE
Ketones, ur: NEGATIVE mg/dL
NITRITE: NEGATIVE
Protein, ur: 30 mg/dL — AB
Specific Gravity, Urine: 1.015 (ref 1.005–1.030)
pH: 6.5 (ref 5.0–8.0)

## 2015-04-09 LAB — COMPREHENSIVE METABOLIC PANEL
ALK PHOS: 109 U/L (ref 38–126)
ALT: 31 U/L (ref 14–54)
AST: 34 U/L (ref 15–41)
Albumin: 3.5 g/dL (ref 3.5–5.0)
Anion gap: 9 (ref 5–15)
BILIRUBIN TOTAL: 0.2 mg/dL — AB (ref 0.3–1.2)
CALCIUM: 9.4 mg/dL (ref 8.9–10.3)
CO2: 27 mmol/L (ref 22–32)
CREATININE: 0.5 mg/dL (ref 0.44–1.00)
Chloride: 100 mmol/L — ABNORMAL LOW (ref 101–111)
GFR calc Af Amer: >60 mL/min (ref >60)
GLUCOSE: 356 mg/dL — AB (ref 65–99)
Potassium: 4.1 mmol/L (ref 3.5–5.1)
Sodium: 136 mmol/L (ref 135–145)
TOTAL PROTEIN: 6.8 g/dL (ref 6.5–8.1)

## 2015-04-09 LAB — CBC
HCT: 38.6 % (ref 36.0–46.0)
Hemoglobin: 13.2 g/dL (ref 12.0–15.0)
MCH: 27.5 pg (ref 26.0–34.0)
MCHC: 34.2 g/dL (ref 30.0–36.0)
MCV: 80.4 fL (ref 78.0–100.0)
Platelets: 181 10*3/uL (ref 150–400)
RBC: 4.8 MIL/uL (ref 3.87–5.11)
RDW: 13.1 % (ref 11.5–15.5)
WBC: 6.9 10*3/uL (ref 4.0–10.5)

## 2015-04-09 LAB — URINE MICROSCOPIC-ADD ON

## 2015-04-09 LAB — POC URINE PREG, ED: PREG TEST UR: NEGATIVE

## 2015-04-09 MED ORDER — ONDANSETRON 4 MG PO TBDP
4.0000 mg | ORAL_TABLET | Freq: Once | ORAL | Status: AC | PRN
  Administered 2015-04-10: 4 mg via ORAL
  Filled 2015-04-09: qty 1

## 2015-04-09 NOTE — ED Notes (Signed)
Pt here for back pain and right sided rib pain since last night with nausea and headache. No vomiting or diarrhea. Has burning with urination.

## 2015-04-09 NOTE — ED Provider Notes (Signed)
CSN: [id number]     Arrival date & time 04/09/15  2045 History   By signing my name below, I, Forrestine Him, attest that this documentation has been prepared under the direction and in the presence of Ripley Fraise, MD.  Electronically Signed: Forrestine Him, ED Scribe. 04/09/2015. 12:09 AM.   Chief Complaint  Patient presents with  . Abdominal Pain  . Flank Pain   Patient is a 40 y.o. female presenting with abdominal pain. The history is provided by the patient. A language interpreter was used [id number]  Abdominal Pain Pain location:  RUQ Pain quality: sharp   Pain radiates to:  Back Pain severity:  Moderate Onset quality:  Sudden Duration:  1 day Timing:  Constant Progression:  Unchanged Chronicity:  New Relieved by:  Nothing Worsened by:  Nothing tried Associated symptoms: cough, nausea and vomiting   Associated symptoms: no chest pain, no chills, no diarrhea, no fever and no shortness of breath     HPI Comments: [name] is a 40 y.o. female with a PMHx of HTN, DM, and GERD who presents to the Emergency Department complaining of constant, ongoing R sided abdominal pain that radiates to the back onset last night. Pain is described as sharp. No aggravating or alleviating factors at this time. Pt also reports nausea, vomiting, HA, mild cough, and dysuria. No recent diarrhea, CP,or shortness of breath. PSHx includes cesarean section and tubal ligation.  No known allergies to medications.  PCP: Paula Compton, MD    Past Medical History  Diagnosis Date  . Hypertension   . Depression   . Diabetes mellitus   . GERD (gastroesophageal reflux disease)   . Peripheral neuropathy ([place])   . Chronic chest pain   . Dizziness   . Chronic pelvic pain in female   . DDD (degenerative disc disease), cervical   . Headache   . H/O echocardiogram 09/2013    normal   Past Surgical History  Procedure Laterality Date  . Cesarean section    . Tubal ligation    . Colonoscopy N/A  04/07/2014    [id number] HH/EPIGASTRIC PAIN/SMALL INTERNAL HEMORRHOIDS/MILD DIVERTICULOSIS IN LEFT COLON  . Esophagogastroduodenoscopy N/A 04/07/2014    [id number] HH/EPIGASTRIC PAIN/SMALL INTERNAL HEMORRHOIDS/MILD DIVERTICULOSIS IN LEFT COLON   Family History  Problem Relation Age of Onset  . Hypertension Mother   . Diabetes Father   . Colon cancer Neg Hx    Social History  Substance Use Topics  . Smoking status: Never Smoker   . Smokeless tobacco: Never Used  . Alcohol Use: No   OB History    Gravida Para Term Preterm AB TAB SAB Ectopic Multiple Living   6 3 3       3      Review of Systems  Constitutional: Negative for fever and chills.  Respiratory: Positive for cough. Negative for shortness of breath.   Cardiovascular: Negative for chest pain.  Gastrointestinal: Positive for nausea, vomiting and abdominal pain. Negative for diarrhea.  Musculoskeletal: Positive for back pain.  Neurological: Positive for headaches.  Psychiatric/Behavioral: Negative for confusion.  All other systems reviewed and are negative.     Allergies  Review of patient's allergies indicates no known allergies.  Home Medications   Prior to Admission medications   Medication Sig Start Date End Date Taking? Authorizing Provider  glipiZIDE (GLUCOTROL) 5 MG tablet Take 1 tablet (5 mg total) by mouth 2 (two) times daily before a meal. 12/03/14   Aquilla Hacker, MD  Levonorgestrel-Ethinyl Estradiol ([name],[name])  0.15-0.03 &0.01 MG tablet Take 1 tablet by mouth daily. 10/30/14   Aquilla Hacker, MD  metFORMIN (GLUCOPHAGE) 1000 MG tablet Take 1 tablet (1,000 mg total) by mouth 2 (two) times daily with a meal. 10/16/14   Aquilla Hacker, MD  Norgestimate-Ethinyl Estradiol Triphasic 0.18/0.215/0.25 MG-35 MCG tablet Take 1 tablet by mouth daily. 12/03/14   Aquilla Hacker, MD  nortriptyline (PAMELOR) 25 MG capsule Take 1 capsule (25 mg total) by mouth at bedtime. 10/16/14   Aquilla Hacker, MD  omeprazole  (PRILOSEC) 20 MG capsule Take 1 capsule (20 mg total) by mouth 2 (two) times daily before a meal. 04/07/14   Danie Binder, MD  ranitidine (ZANTAC) 150 MG tablet Take 1 tablet (150 mg total) by mouth 2 (two) times daily as needed for heartburn. Patient taking differently: Take 150 mg by mouth daily.  04/07/14   Danie Binder, MD   Triage Vitals: BP 118/61 mmHg  Pulse 52  Temp(Src) 98 F (36.7 C)  Resp 18  SpO2 99%  LMP 03/16/2015   Physical Exam  CONSTITUTIONAL: Well developed/well nourished HEAD: Normocephalic/atraumatic EYES: EOMI/PERRL, no icterus ENMT: Mucous membranes moist NECK: supple no meningeal signs SPINE/BACK:entire spine nontender CV: S1/S2 noted, no murmurs/rubs/gallops noted LUNGS: Lungs are clear to auscultation bilaterally, no apparent distress ABDOMEN: soft, moderate RUQ tenderness, no rebound or guarding, bowel sounds noted throughout abdomen GU:no cva tenderness NEURO: Pt is awake/alert/appropriate, moves all extremitiesx4.  No facial droop.   EXTREMITIES: pulses normal/equal, full ROM SKIN: warm, color normal PSYCH: no abnormalities of mood noted, alert and oriented to situation   ED Course  Procedures  Medications  morphine 4 MG/ML injection 4 mg (not administered)  ondansetron (ZOFRAN) injection 4 mg (not administered)  ondansetron (ZOFRAN-ODT) disintegrating tablet 4 mg (4 mg Oral Given 04/10/15 0000)  morphine 4 MG/ML injection 4 mg (4 mg Intravenous Given 04/10/15 0027)  ondansetron (ZOFRAN) injection 4 mg (4 mg Intravenous Given 04/10/15 0027)  sodium chloride 0.9 % bolus 1,000 mL (0 mLs Intravenous Stopped 04/10/15 0137)  HYDROmorphone (DILAUDID) injection 1 mg (1 mg Intravenous Given 04/10/15 0134)  ondansetron (ZOFRAN) injection 4 mg (4 mg Intravenous Given 04/10/15 0134)    DIAGNOSTIC STUDIES: Oxygen Saturation is 99% on RA, Normal by my interpretation.    COORDINATION OF CARE: 12:02 AM- Will give Zofran, Morphine, and fluids. Will order US  abdomen complete, CBC, urine pregnancy, and CMP. Discussed treatment plan with pt at bedside and pt agreed to plan.    2:35 AM Pt with intractable pain from cholelithiasis D/w dr Grand[place] Silos with surgery He will evaluate patient  Labs Review Labs Reviewed  COMPREHENSIVE METABOLIC PANEL - Abnormal; Notable for the following:    Chloride 100 (*)    Glucose, Bld 356 (*)    BUN <5 (*)    Total Bilirubin 0.2 (*)    All other components within normal limits  URINALYSIS, ROUTINE W REFLEX MICROSCOPIC (NOT AT [facility]) - Abnormal; Notable for the following:    Glucose, UA >1000 (*)    Protein, ur 30 (*)    Leukocytes, UA SMALL (*)    All other components within normal limits  URINE MICROSCOPIC-ADD ON - Abnormal; Notable for the following:    Squamous Epithelial / LPF 0-5 (*)    Bacteria, UA FEW (*)    All other components within normal limits  CBC  LIPASE, BLOOD  POC URINE PREG, ED    Imaging Review US Abdomen Limited Ruq  04/10/2015  CLINICAL DATA:  Right upper quadrant pain and nausea for 1 day. EXAM: US ABDOMEN LIMITED - RIGHT UPPER QUADRANT COMPARISON:  CT abdomen and pelvis 12/12/2013 FINDINGS: Gallbladder: Multiple stones in the dependent portion of the gallbladder. Largest measures 1.4 cm diameter. No sludge or wall thickening. Murphy's sign is negative. Common bile duct: Diameter: 3 mm, normal Liver: Mild diffuse increased parenchymal echotexture in the liver suggesting fatty infiltration. Visualization is somewhat limited by streak artifact from the ribs. No focal lesions identified appear IMPRESSION: Multiple gallstones. No additional evidence of cholecystitis. Probable fatty infiltration of the liver. Electronically Signed   By: Lucienne Capers M.D.   On: 04/10/2015 01:15   I have personally reviewed and evaluated these  lab results as part of my medical decision-making.   EKG Interpretation   Date/Time:  Friday April 10 2015 00:32:29 EST Ventricular Rate:  70 PR Interval:   137 QRS Duration: 92 QT Interval:  420 QTC Calculation: 453 R Axis:   40 Text Interpretation:  Sinus rhythm Low voltage, precordial leads No  significant change since last tracing artifact noted Confirmed by Christy Gentles   MD, Elenore Rota ([id number]) on 04/10/2015 12:39:51 AM      MDM   Final diagnoses:  Calculus of gallbladder without cholecystitis without obstruction    Nursing notes including past medical history and social history reviewed and considered in documentation Labs/vital reviewed myself and considered during evaluation   I personally performed the services described in this documentation, which was scribed in my presence. The recorded information has been reviewed and is accurate.       Ripley Fraise, MD 04/10/15 [phone]

## 2015-04-10 ENCOUNTER — Encounter (HOSPITAL_COMMUNITY): Payer: Self-pay | Admitting: Certified Registered Nurse Anesthetist

## 2015-04-10 ENCOUNTER — Emergency Department (HOSPITAL_COMMUNITY): Payer: Medicaid Other

## 2015-04-10 ENCOUNTER — Observation Stay (HOSPITAL_COMMUNITY): Payer: Medicaid Other | Admitting: Certified Registered Nurse Anesthetist

## 2015-04-10 ENCOUNTER — Encounter (HOSPITAL_COMMUNITY)
Admission: EM | Disposition: A | Discharge status: Home / Self Care | Payer: Self-pay | Source: Home / Self Care | Attending: Emergency Medicine

## 2015-04-10 DIAGNOSIS — K219 Gastro-esophageal reflux disease without esophagitis: Secondary | ICD-10-CM | POA: Diagnosis not present

## 2015-04-10 DIAGNOSIS — K8012 Calculus of gallbladder with acute and chronic cholecystitis without obstruction: Secondary | ICD-10-CM | POA: Diagnosis not present

## 2015-04-10 DIAGNOSIS — I10 Essential (primary) hypertension: Secondary | ICD-10-CM | POA: Diagnosis not present

## 2015-04-10 DIAGNOSIS — E1142 Type 2 diabetes mellitus with diabetic polyneuropathy: Secondary | ICD-10-CM | POA: Diagnosis not present

## 2015-04-10 DIAGNOSIS — K8020 Calculus of gallbladder without cholecystitis without obstruction: Secondary | ICD-10-CM | POA: Diagnosis present

## 2015-04-10 HISTORY — PX: CHOLECYSTECTOMY: SHX55

## 2015-04-10 LAB — GLUCOSE, CAPILLARY
GLUCOSE-CAPILLARY: 177 mg/dL — AB (ref 65–99)
GLUCOSE-CAPILLARY: 211 mg/dL — AB (ref 65–99)
Glucose-Capillary: 201 mg/dL — ABNORMAL HIGH (ref 65–99)

## 2015-04-10 LAB — CBG MONITORING, ED
Glucose-Capillary: 293 mg/dL — ABNORMAL HIGH (ref 65–99)
Glucose-Capillary: 264 mg/dL — ABNORMAL HIGH (ref 65–99)
Glucose-Capillary: 225 mg/dL — ABNORMAL HIGH (ref 65–99)

## 2015-04-10 LAB — LIPASE, BLOOD: Lipase: 28 U/L (ref 11–51)

## 2015-04-10 SURGERY — LAPAROSCOPIC CHOLECYSTECTOMY WITH INTRAOPERATIVE CHOLANGIOGRAM
Anesthesia: General | Site: Abdomen

## 2015-04-10 MED ORDER — LIDOCAINE HCL (CARDIAC) 20 MG/ML IV SOLN
INTRAVENOUS | Status: AC
  Filled 2015-04-10: qty 5

## 2015-04-10 MED ORDER — SUGAMMADEX SODIUM 200 MG/2ML IV SOLN
INTRAVENOUS | Status: DC | PRN
  Administered 2015-04-10: 200 mg via INTRAVENOUS

## 2015-04-10 MED ORDER — PROPOFOL 10 MG/ML IV BOLUS
INTRAVENOUS | Status: AC
Start: 2015-04-10 — End: 2015-04-10
  Filled 2015-04-10: qty 20

## 2015-04-10 MED ORDER — BUPIVACAINE-EPINEPHRINE 0.25% -1:200000 IJ SOLN
INTRAMUSCULAR | Status: DC | PRN
  Administered 2015-04-10: 20 mL

## 2015-04-10 MED ORDER — SUGAMMADEX SODIUM 200 MG/2ML IV SOLN
INTRAVENOUS | Status: AC
  Filled 2015-04-10: qty 2

## 2015-04-10 MED ORDER — ONDANSETRON HCL 4 MG/2ML IJ SOLN
INTRAMUSCULAR | Status: AC
  Filled 2015-04-10: qty 2

## 2015-04-10 MED ORDER — LACTATED RINGERS IV SOLN
INTRAVENOUS | Status: DC
  Administered 2015-04-10: 13:00:00 via INTRAVENOUS

## 2015-04-10 MED ORDER — ROCURONIUM BROMIDE 100 MG/10ML IV SOLN
INTRAVENOUS | Status: DC | PRN
  Administered 2015-04-10: 30 mg via INTRAVENOUS

## 2015-04-10 MED ORDER — SUCCINYLCHOLINE CHLORIDE 20 MG/ML IJ SOLN
INTRAMUSCULAR | Status: DC | PRN
  Administered 2015-04-10: 140 mg via INTRAVENOUS

## 2015-04-10 MED ORDER — FENTANYL CITRATE (PF) 100 MCG/2ML IJ SOLN: 25.0000 ug | INTRAMUSCULAR | Status: DC | PRN

## 2015-04-10 MED ORDER — DIPHENHYDRAMINE HCL 50 MG/ML IJ SOLN: 25.0000 mg | Freq: Four times a day (QID) | INTRAMUSCULAR | Status: DC | PRN

## 2015-04-10 MED ORDER — CEFAZOLIN SODIUM-DEXTROSE 2-3 GM-% IV SOLR
INTRAVENOUS | Status: DC | PRN
  Administered 2015-04-10: 2 g via INTRAVENOUS

## 2015-04-10 MED ORDER — DIPHENHYDRAMINE HCL 25 MG PO CAPS: 25.0000 mg | ORAL_CAPSULE | Freq: Four times a day (QID) | ORAL | Status: DC | PRN

## 2015-04-10 MED ORDER — ONDANSETRON HCL 4 MG/2ML IJ SOLN
4.0000 mg | Freq: Once | INTRAMUSCULAR | Status: AC
  Administered 2015-04-10: 4 mg via INTRAVENOUS
  Filled 2015-04-10: qty 2

## 2015-04-10 MED ORDER — POTASSIUM CHLORIDE IN NACL 20-0.9 MEQ/L-% IV SOLN
INTRAVENOUS | Status: DC
  Administered 2015-04-10 – 2015-04-11 (×3): via INTRAVENOUS
  Filled 2015-04-10 (×6): qty 1000

## 2015-04-10 MED ORDER — MORPHINE SULFATE (PF) 2 MG/ML IV SOLN
2.0000 mg | INTRAVENOUS | Status: DC | PRN
  Administered 2015-04-10: 4 mg via INTRAVENOUS
  Administered 2015-04-10 – 2015-04-11 (×2): 2 mg via INTRAVENOUS
  Filled 2015-04-10: qty 2
  Filled 2015-04-10 (×2): qty 1

## 2015-04-10 MED ORDER — DEXTROSE 5 % IV SOLN
2.0000 g | INTRAVENOUS | Status: DC
  Administered 2015-04-10 – 2015-04-12 (×3): 2 g via INTRAVENOUS
  Filled 2015-04-10 (×3): qty 2

## 2015-04-10 MED ORDER — MIDAZOLAM HCL 2 MG/2ML IJ SOLN
INTRAMUSCULAR | Status: AC
  Filled 2015-04-10: qty 2

## 2015-04-10 MED ORDER — PROPOFOL 10 MG/ML IV BOLUS
INTRAVENOUS | Status: DC | PRN
  Administered 2015-04-10: 150 mg via INTRAVENOUS

## 2015-04-10 MED ORDER — BUPIVACAINE-EPINEPHRINE (PF) 0.25% -1:200000 IJ SOLN
INTRAMUSCULAR | Status: AC
  Filled 2015-04-10: qty 30

## 2015-04-10 MED ORDER — LIDOCAINE HCL (CARDIAC) 20 MG/ML IV SOLN
INTRAVENOUS | Status: DC | PRN
  Administered 2015-04-10: 100 mg via INTRAVENOUS

## 2015-04-10 MED ORDER — ONDANSETRON 4 MG PO TBDP: 4.0000 mg | ORAL_TABLET | Freq: Four times a day (QID) | ORAL | Status: DC | PRN

## 2015-04-10 MED ORDER — PANTOPRAZOLE SODIUM 40 MG IV SOLR
40.0000 mg | Freq: Every day | INTRAVENOUS | Status: DC
  Administered 2015-04-10 – 2015-04-11 (×2): 40 mg via INTRAVENOUS
  Filled 2015-04-10 (×2): qty 40

## 2015-04-10 MED ORDER — ONDANSETRON HCL 4 MG/2ML IJ SOLN
INTRAMUSCULAR | Status: AC
  Filled 2015-04-10: qty 6

## 2015-04-10 MED ORDER — ONDANSETRON HCL 4 MG/2ML IJ SOLN
4.0000 mg | Freq: Four times a day (QID) | INTRAMUSCULAR | Status: DC | PRN
  Administered 2015-04-10 – 2015-04-11 (×6): 4 mg via INTRAVENOUS
  Filled 2015-04-10 (×6): qty 2

## 2015-04-10 MED ORDER — INSULIN ASPART 100 UNIT/ML ~~LOC~~ SOLN
0.0000 [IU] | SUBCUTANEOUS | Status: DC
  Administered 2015-04-10 (×2): 5 [IU] via SUBCUTANEOUS
  Administered 2015-04-10 (×2): 8 [IU] via SUBCUTANEOUS
  Administered 2015-04-11: 3 [IU] via SUBCUTANEOUS
  Administered 2015-04-11: 2 [IU] via SUBCUTANEOUS
  Administered 2015-04-11: 3 [IU] via SUBCUTANEOUS
  Administered 2015-04-11: 2 [IU] via SUBCUTANEOUS
  Administered 2015-04-11: 5 [IU] via SUBCUTANEOUS
  Administered 2015-04-11: 3 [IU] via SUBCUTANEOUS
  Administered 2015-04-12 (×2): 2 [IU] via SUBCUTANEOUS
  Administered 2015-04-12: 3 [IU] via SUBCUTANEOUS
  Filled 2015-04-10 (×3): qty 1

## 2015-04-10 MED ORDER — MORPHINE SULFATE (PF) 2 MG/ML IV SOLN
2.0000 mg | INTRAVENOUS | Status: DC | PRN
  Administered 2015-04-10 (×2): 4 mg via INTRAVENOUS
  Filled 2015-04-10 (×2): qty 2

## 2015-04-10 MED ORDER — 0.9 % SODIUM CHLORIDE (POUR BTL) OPTIME
TOPICAL | Status: DC | PRN
  Administered 2015-04-10: 1000 mL

## 2015-04-10 MED ORDER — OXYCODONE-ACETAMINOPHEN 5-325 MG PO TABS
1.0000 | ORAL_TABLET | ORAL | Status: DC | PRN
  Administered 2015-04-11: 2 via ORAL
  Filled 2015-04-10: qty 2

## 2015-04-10 MED ORDER — HYDRALAZINE HCL 20 MG/ML IJ SOLN: 10.0000 mg | INTRAMUSCULAR | Status: DC | PRN

## 2015-04-10 MED ORDER — SODIUM CHLORIDE 0.9 % IR SOLN
Status: DC | PRN
  Administered 2015-04-10: 1000 mL

## 2015-04-10 MED ORDER — FENTANYL CITRATE (PF) 250 MCG/5ML IJ SOLN
INTRAMUSCULAR | Status: AC
  Filled 2015-04-10: qty 5

## 2015-04-10 MED ORDER — ONDANSETRON HCL 4 MG/2ML IJ SOLN
INTRAMUSCULAR | Status: DC | PRN
  Administered 2015-04-10: 4 mg via INTRAVENOUS

## 2015-04-10 MED ORDER — MORPHINE SULFATE (PF) 4 MG/ML IV SOLN
4.0000 mg | Freq: Once | INTRAVENOUS | Status: AC
  Administered 2015-04-10: 4 mg via INTRAVENOUS
  Filled 2015-04-10: qty 1

## 2015-04-10 MED ORDER — SODIUM CHLORIDE 0.9 % IV BOLUS (SEPSIS)
1000.0000 mL | Freq: Once | INTRAVENOUS | Status: AC
  Administered 2015-04-10: 1000 mL via INTRAVENOUS

## 2015-04-10 MED ORDER — ROCURONIUM BROMIDE 50 MG/5ML IV SOLN
INTRAVENOUS | Status: AC
  Filled 2015-04-10: qty 1

## 2015-04-10 MED ORDER — SUFENTANIL CITRATE 50 MCG/ML IV SOLN: INTRAVENOUS | Status: DC | PRN

## 2015-04-10 MED ORDER — MORPHINE SULFATE (PF) 4 MG/ML IV SOLN
4.0000 mg | Freq: Once | INTRAVENOUS | Status: AC
Start: 2015-04-10 — End: 2015-04-10
  Administered 2015-04-10: 4 mg via INTRAVENOUS
  Filled 2015-04-10: qty 1

## 2015-04-10 MED ORDER — MIDAZOLAM HCL 5 MG/5ML IJ SOLN
INTRAMUSCULAR | Status: DC | PRN
  Administered 2015-04-10: 2 mg via INTRAVENOUS

## 2015-04-10 MED ORDER — FENTANYL CITRATE (PF) 100 MCG/2ML IJ SOLN
INTRAMUSCULAR | Status: DC | PRN
  Administered 2015-04-10: 100 ug via INTRAVENOUS

## 2015-04-10 MED ORDER — HYDROMORPHONE HCL 1 MG/ML IJ SOLN
1.0000 mg | Freq: Once | INTRAMUSCULAR | Status: AC
  Administered 2015-04-10: 1 mg via INTRAVENOUS
  Filled 2015-04-10: qty 1

## 2015-04-10 SURGICAL SUPPLY — 35 items
APPLIER CLIP 5 13 M/L LIGAMAX5 (MISCELLANEOUS) ×3
APR CLP MED LRG 5 ANG JAW (MISCELLANEOUS) ×1
BAG SPEC RTRVL LRG 6X4 10 (ENDOMECHANICALS) ×1
CANISTER SUCTION 2500CC (MISCELLANEOUS) ×3 IMPLANT
CHLORAPREP W/TINT 26ML (MISCELLANEOUS) ×3 IMPLANT
CLIP APPLIE 5 13 M/L LIGAMAX5 (MISCELLANEOUS) ×1 IMPLANT
COVER SURGICAL LIGHT HANDLE (MISCELLANEOUS) ×3 IMPLANT
ELECT REM PT RETURN 9FT ADLT (ELECTROSURGICAL) ×3
ELECTRODE REM PT RTRN 9FT ADLT (ELECTROSURGICAL) ×1 IMPLANT
GLOVE BIO SURGEON STRL SZ7 (GLOVE) ×2 IMPLANT
GLOVE BIOGEL PI IND STRL 7.0 (GLOVE) IMPLANT
GLOVE BIOGEL PI IND STRL 7.5 (GLOVE) IMPLANT
GLOVE BIOGEL PI INDICATOR 7.0 (GLOVE) ×2
GLOVE BIOGEL PI INDICATOR 7.5 (GLOVE) ×2
GLOVE ECLIPSE 7.5 STRL STRAW (GLOVE) ×2 IMPLANT
GLOVE SURG SIGNA 7.5 PF LTX (GLOVE) ×3 IMPLANT
GOWN STRL REUS W/ TWL LRG LVL3 (GOWN DISPOSABLE) ×2 IMPLANT
GOWN STRL REUS W/ TWL XL LVL3 (GOWN DISPOSABLE) ×1 IMPLANT
GOWN STRL REUS W/TWL LRG LVL3 (GOWN DISPOSABLE) ×6
GOWN STRL REUS W/TWL XL LVL3 (GOWN DISPOSABLE) ×3
KIT BASIN OR (CUSTOM PROCEDURE TRAY) ×3 IMPLANT
KIT ROOM TURNOVER OR (KITS) ×3 IMPLANT
LIQUID BAND (GAUZE/BANDAGES/DRESSINGS) ×6 IMPLANT
NS IRRIG 1000ML POUR BTL (IV SOLUTION) ×3 IMPLANT
POUCH SPECIMEN RETRIEVAL 10MM (ENDOMECHANICALS) ×3 IMPLANT
SCISSORS LAP 5X35 DISP (ENDOMECHANICALS) ×3 IMPLANT
SET IRRIG TUBING LAPAROSCOPIC (IRRIGATION / IRRIGATOR) ×3 IMPLANT
SLEEVE ENDOPATH XCEL 5M (ENDOMECHANICALS) ×6 IMPLANT
SPECIMEN JAR SMALL (MISCELLANEOUS) ×3 IMPLANT
SUT MON AB 4-0 PC3 18 (SUTURE) ×3 IMPLANT
TOWEL OR 17X26 10 PK STRL BLUE (TOWEL DISPOSABLE) ×3 IMPLANT
TRAY LAPAROSCOPIC MC (CUSTOM PROCEDURE TRAY) ×3 IMPLANT
TROCAR XCEL BLUNT TIP 100MML (ENDOMECHANICALS) ×3 IMPLANT
TROCAR XCEL NON-BLD 5MMX100MML (ENDOMECHANICALS) ×3 IMPLANT
TUBING INSUFFLATION (TUBING) ×3 IMPLANT

## 2015-04-10 NOTE — Transfer of Care (Signed)
Immediate Anesthesia Transfer of Care Note  Patient: [name]  Procedure(s) Performed: Procedure(s): LAPAROSCOPIC CHOLECYSTECTOMY  (N/A)  Patient Location: PACU  Anesthesia Type:General  Level of Consciousness: awake, alert , patient cooperative and responds to stimulation  Airway & Oxygen Therapy: Patient Spontanous Breathing and Patient connected to nasal cannula oxygen  Post-op Assessment: Report given to RN, Post -op Vital signs reviewed and stable and Patient moving all extremities X 4  Post vital signs: Reviewed and stable  Last Vitals:  Filed Vitals:   04/10/15 1115 04/10/15 1430  BP: 106/61 103/60  Pulse: 65 90  Temp:  36.4 C  Resp: 15 16    Complications: No apparent anesthesia complications

## 2015-04-10 NOTE — ED Notes (Signed)
RN transporting pt to Short stay, family at bedside, all belongings with family.  Report given to [facility].  A x 4, NAD, VSS.

## 2015-04-10 NOTE — Op Note (Signed)

## 2015-04-10 NOTE — Anesthesia Procedure Notes (Signed)
Procedure Name: Intubation Date/Time: 04/10/2015 1:42 PM Performed by: Tressia Miners LEFFEW Pre-anesthesia Checklist: Patient identified, Patient being monitored, Timeout performed, Emergency Drugs available and Suction available Patient Re-evaluated:Patient Re-evaluated prior to inductionOxygen Delivery Method: Circle System Utilized Preoxygenation: Pre-oxygenation with 100% oxygen Intubation Type: IV induction Laryngoscope Size: Glidescope Grade View: Grade I Tube type: Oral Tube size: 7.0 mm Number of attempts: 1 Airway Equipment and Method: Video-laryngoscopy Placement Confirmation: ETT inserted through vocal cords under direct vision,  positive ETCO2 and breath sounds checked- equal and bilateral Secured at: 22 cm Tube secured with: Tape Dental Injury: Teeth and Oropharynx as per pre-operative assessment

## 2015-04-10 NOTE — Progress Notes (Signed)
Patient ID: Blondine Candido, female   DOB: 02/17/1976, 40 y.o.   MRN: [id number]  Pt with symptomatic cholelithiasis. I have reviewed the H and P Lap chole is recommended. I discussed the procedure in detail.  We discussed the risks and benefits of a laparoscopic cholecystectomy and possible cholangiogram including, but not limited to bleeding, infection, injury to surrounding structures such as the intestine or liver, bile leak, retained gallstones, need to convert to an open procedure, prolonged diarrhea, blood clots such as  DVT, common bile duct injury, anesthesia risks, and possible need for additional procedures.  The likelihood of improvement in symptoms and return to the patient's normal status is good. We discussed the typical post-operative recovery course.

## 2015-04-10 NOTE — ED Notes (Signed)
Lab called to add on lipase.

## 2015-04-10 NOTE — Anesthesia Preprocedure Evaluation (Signed)
Anesthesia Evaluation  Patient identified by MRN, date of birth, ID band Patient awake    Reviewed: Allergy & Precautions, H&P , Patient's Chart, lab work & pertinent test results, reviewed documented beta blocker date and time   Airway Mallampati: II  TM Distance: >3 FB Neck ROM: full    Dental no notable dental hx.    Pulmonary    Pulmonary exam normal breath sounds clear to auscultation       Cardiovascular hypertension (Takes no HTN meds; BP good today),  Rhythm:regular Rate:Normal     Neuro/Psych    GI/Hepatic GERD  ,  Endo/Other  diabetes, Type 2Morbid obesity  Renal/GU      Musculoskeletal   Abdominal   Peds  Hematology   Anesthesia Other Findings DM- BS 226; takes No insulin  MO   Reproductive/Obstetrics                             Anesthesia Physical Anesthesia Plan  ASA: III  Anesthesia Plan: General   Post-op Pain Management:    Induction: Intravenous, Rapid sequence and Cricoid pressure planned  Airway Management Planned: Oral ETT and Video Laryngoscope Planned  Additional Equipment:   Intra-op Plan:   Post-operative Plan: Extubation in OR  Informed Consent: I have reviewed the patients History and Physical, chart, labs and discussed the procedure including the risks, benefits and alternatives for the proposed anesthesia with the patient or authorized representative who has indicated his/her understanding and acceptance.   Dental Advisory Given and Dental advisory given  Plan Discussed with: CRNA and Surgeon  Anesthesia Plan Comments: (Glidescope in Room  Discussed general anesthesia, including possible nausea, instrumentation of airway, sore throat,pulmonary aspiration, etc. I asked if the were any outstanding questions, or  concerns before we proceeded. )        Anesthesia Quick Evaluation

## 2015-04-10 NOTE — H&P (Signed)
[name] is an 40 y.o. female.   Chief Complaint: Right upper quadrant pain, nausea and vomiting  HPI: [name] presented to the emergency department complaining of right upper quadrant pain. Pain extends from her right upper quadrant around to her back. She has associated nausea and vomiting. She has had similar pains in the past multiple times, but it has never been this severe. She was evaluated including right upper quadrant ultrasound. This demonstrates multiple gallstones without clear evidence of cholecystitis. She continues to have right upper quadrant pain despite pain medication. I was asked to see her to address this from a surgical standpoint.  Past Medical History  Diagnosis Date  . Hypertension   . Depression   . Diabetes mellitus   . GERD (gastroesophageal reflux disease)   . Peripheral neuropathy ([place])   . Chronic chest pain   . Dizziness   . Chronic pelvic pain in female   . DDD (degenerative disc disease), cervical   . Headache   . H/O echocardiogram 09/2013    normal    Past Surgical History  Procedure Laterality Date  . Cesarean section    . Tubal ligation    . Colonoscopy N/A 04/07/2014    TWS:FKCLE HH/EPIGASTRIC PAIN/SMALL INTERNAL HEMORRHOIDS/MILD DIVERTICULOSIS IN LEFT COLON  . Esophagogastroduodenoscopy N/A 04/07/2014    XNT:[id number] HH/EPIGASTRIC PAIN/SMALL INTERNAL HEMORRHOIDS/MILD DIVERTICULOSIS IN LEFT COLON    Family History  Problem Relation Age of Onset  . Hypertension Mother   . Diabetes Father   . Colon cancer Neg Hx    Social History:  reports that she has never smoked. She has never used smokeless tobacco. She reports that she does not drink alcohol or use illicit drugs.  Allergies: No Known Allergies   (Not in a hospital admission)  Results for orders placed or performed during the hospital encounter of 04/09/15 (from the past 48 hour(s))  Lipase, blood     Status: None   Collection Time: 04/09/15  1:07 AM  Result Value Ref  Range   Lipase 28 11 - 51 U/L  Urinalysis, Routine w reflex microscopic (not at [facility])     Status: Abnormal   Collection Time: 04/09/15  9:10 PM  Result Value Ref Range   Color, Urine YELLOW YELLOW   APPearance CLEAR CLEAR   Specific Gravity, Urine 1.015 1.005 - 1.030   pH 6.5 5.0 - 8.0   Glucose, UA >1000 (A) NEGATIVE mg/dL   Hgb urine dipstick NEGATIVE NEGATIVE   Bilirubin Urine NEGATIVE NEGATIVE   Ketones, ur NEGATIVE NEGATIVE mg/dL   Protein, ur 30 (A) NEGATIVE mg/dL   Nitrite NEGATIVE NEGATIVE   Leukocytes, UA SMALL (A) NEGATIVE  Urine microscopic-add on     Status: Abnormal   Collection Time: 04/09/15  9:10 PM  Result Value Ref Range   Squamous Epithelial / LPF 0-5 (A) NONE SEEN   WBC, UA 6-30 0 - 5 WBC/hpf   RBC / HPF 0-5 0 - 5 RBC/hpf   Bacteria, UA FEW (A) NONE SEEN   Urine-Other YEAST PRESENT   Comprehensive metabolic panel     Status: Abnormal   Collection Time: 04/09/15  9:18 PM  Result Value Ref Range   Sodium 136 135 - 145 mmol/L   Potassium 4.1 3.5 - 5.1 mmol/L   Chloride 100 (L) 101 - 111 mmol/L   CO2 27 22 - 32 mmol/L   Glucose, Bld 356 (H) 65 - 99 mg/dL   BUN <5 (L) 6 - 20 mg/dL   Creatinine,  Ser 0.50 0.44 - 1.00 mg/dL   Calcium 9.4 8.9 - 10.3 mg/dL   Total Protein 6.8 6.5 - 8.1 g/dL   Albumin 3.5 3.5 - 5.0 g/dL   AST 34 15 - 41 U/L   ALT 31 14 - 54 U/L   Alkaline Phosphatase 109 38 - 126 U/L   Total Bilirubin 0.2 (L) 0.3 - 1.2 mg/dL   GFR calc non Af Amer >60 >60 mL/min   GFR calc Af Amer >60 >60 mL/min    Comment: (NOTE) The eGFR has been calculated using the CKD EPI equation. This calculation has not been validated in all clinical situations. eGFR's persistently <60 mL/min signify possible Chronic Kidney Disease.    Anion gap 9 5 - 15  CBC     Status: None   Collection Time: 04/09/15  9:18 PM  Result Value Ref Range   WBC 6.9 4.0 - 10.5 K/uL   RBC 4.80 3.87 - 5.11 MIL/uL   Hemoglobin 13.2 12.0 - 15.0 g/dL   HCT 38.6 36.0 - 46.0 %   MCV 80.4  78.0 - 100.0 fL   MCH 27.5 26.0 - 34.0 pg   MCHC 34.2 30.0 - 36.0 g/dL   RDW 13.1 11.5 - 15.5 %   Platelets 181 150 - 400 K/uL  POC urine preg, ED (not at [facility])     Status: None   Collection Time: 04/09/15  9:25 PM  Result Value Ref Range   Preg Test, Ur NEGATIVE NEGATIVE    Comment:        THE SENSITIVITY OF THIS METHODOLOGY IS >24 mIU/mL    US Abdomen Limited Ruq  04/10/2015  CLINICAL DATA:  Right upper quadrant pain and nausea for 1 day. EXAM: US ABDOMEN LIMITED - RIGHT UPPER QUADRANT COMPARISON:  CT abdomen and pelvis 12/12/2013 FINDINGS: Gallbladder: Multiple stones in the dependent portion of the gallbladder. Largest measures 1.4 cm diameter. No sludge or wall thickening. Murphy's sign is negative. Common bile duct: Diameter: 3 mm, normal Liver: Mild diffuse increased parenchymal echotexture in the liver suggesting fatty infiltration. Visualization is somewhat limited by streak artifact from the ribs. No focal lesions identified appear IMPRESSION: Multiple gallstones. No additional evidence of cholecystitis. Probable fatty infiltration of the liver. Electronically Signed   By: Lucienne Capers M.D.   On: 04/10/2015 01:15    Review of Systems  Constitutional: Negative.   HENT: Negative.   Eyes: Negative.   Respiratory: Negative for cough and shortness of breath.   Cardiovascular: Negative for chest pain.  Gastrointestinal: Positive for nausea, vomiting and abdominal pain.  Genitourinary: Negative.   Musculoskeletal: Negative.   Skin: Negative.   Neurological: Negative.   Endo/Heme/Allergies: Negative.   Psychiatric/Behavioral: Negative.     Blood pressure 119/63, pulse 61, temperature 98 F (36.7 C), resp. rate 12, last menstrual period 03/16/2015, SpO2 96 %. Physical Exam  Constitutional: She is oriented to person, place, and time. She appears well-developed and well-nourished.  HENT:  Head: Normocephalic.  Right Ear: External ear normal.  Left Ear: External ear normal.   Nose: Nose normal.  Mouth/Throat: Oropharynx is clear and moist.  Eyes: EOM are normal. Pupils are equal, round, and reactive to light. No scleral icterus.  Neck: Neck supple. No tracheal deviation present.  Cardiovascular: Normal rate, regular rhythm, normal heart sounds and intact distal pulses.   Respiratory: Effort normal and breath sounds normal. No stridor. No respiratory distress. She has no wheezes. She has no rales.  GI: Soft. Bowel sounds are  normal. She exhibits no distension. There is tenderness. There is no rebound and no guarding.  Musculoskeletal: Normal range of motion. She exhibits no edema.  Neurological: She is alert and oriented to person, place, and time. She exhibits normal muscle tone.  Skin: Skin is warm.  Psychiatric: She has a normal mood and affect.     Assessment/Plan Symptomatic cholelithiasis - will admit, give IV Rocephin, and plan laparoscopic cholecystectomy with possible cholangio-gram later today. Procedure, risks, benefits were discussed in detail with her and her family. She is agreeable. I will discuss this with Dr. Ninfa Linden later this morning.  Diabetes mellitus - IV fluids, sliding scale insulin  Hypertension  [name] E 04/10/2015, 3:04 AM

## 2015-04-10 NOTE — ED Notes (Signed)
Patient transported to Ultrasound 

## 2015-04-11 LAB — GLUCOSE, CAPILLARY
GLUCOSE-CAPILLARY: 167 mg/dL — AB (ref 65–99)
GLUCOSE-CAPILLARY: 171 mg/dL — AB (ref 65–99)
GLUCOSE-CAPILLARY: 173 mg/dL — AB (ref 65–99)
GLUCOSE-CAPILLARY: 176 mg/dL — AB (ref 65–99)
Glucose-Capillary: 206 mg/dL — ABNORMAL HIGH (ref 65–99)
Glucose-Capillary: 151 mg/dL — ABNORMAL HIGH (ref 65–99)

## 2015-04-11 MED ORDER — FENTANYL CITRATE (PF) 100 MCG/2ML IJ SOLN
25.0000 ug | INTRAMUSCULAR | Status: DC | PRN
  Administered 2015-04-11 (×4): 25 ug via INTRAVENOUS
  Filled 2015-04-11 (×4): qty 2

## 2015-04-11 MED ORDER — TRAMADOL HCL 50 MG PO TABS
100.0000 mg | ORAL_TABLET | Freq: Two times a day (BID) | ORAL | Status: DC | PRN
  Administered 2015-04-11 – 2015-04-12 (×3): 100 mg via ORAL
  Filled 2015-04-11 (×3): qty 2

## 2015-04-11 NOTE — Progress Notes (Signed)
1 Day Post-Op  Subjective: Nauseated. Otherwise alert and pleasant.  Stable.  Afebrile.  Voiding without difficulty.  Objective: Vital signs in last 24 hours: Temp:  [97.5 F (36.4 C)-98.7 F (37.1 C)] 98.6 F (37 C) (01/28 0453) Pulse Rate:  [58-90] 60 (01/28 0453) Resp:  [10-18] 18 (01/28 0453) BP: (103-129)/(54-75) 121/66 mmHg (01/28 0453) SpO2:  [95 %-99 %] 98 % (01/28 0453)    Intake/Output from previous day: 01/27 0701 - 01/28 0700 In: 3850 [P.O.:680; I.V.:3070; IV Piggyback:100] Out: 38 [Urine:3; Blood:35] Intake/Output this shift:    General appearance: Alert.  Pleasant.  Cooperative.  Mild distress from incisional pain and nausea Resp: clear to auscultation bilaterally GI: Abdomen soft.  Wounds look good.  Appropriate incisional tenderness.  No distention.  Lab Results:  Results for orders placed or performed during the hospital encounter of 04/09/15 (from the past 24 hour(s))  CBG monitoring, ED     Status: Abnormal   Collection Time: 04/10/15 11:45 AM  Result Value Ref Range   Glucose-Capillary 225 (H) 65 - 99 mg/dL  Glucose, capillary     Status: Abnormal   Collection Time: 04/10/15  2:33 PM  Result Value Ref Range   Glucose-Capillary 177 (H) 65 - 99 mg/dL   Comment 1 Notify RN    Comment 2 Document in Chart   Glucose, capillary     Status: Abnormal   Collection Time: 04/10/15  5:00 PM  Result Value Ref Range   Glucose-Capillary 211 (H) 65 - 99 mg/dL  Glucose, capillary     Status: Abnormal   Collection Time: 04/10/15  8:41 PM  Result Value Ref Range   Glucose-Capillary 201 (H) 65 - 99 mg/dL  Glucose, capillary     Status: Abnormal   Collection Time: 04/11/15 12:10 AM  Result Value Ref Range   Glucose-Capillary 176 (H) 65 - 99 mg/dL   Comment 1 Notify RN   Glucose, capillary     Status: Abnormal   Collection Time: 04/11/15  4:51 AM  Result Value Ref Range   Glucose-Capillary 167 (H) 65 - 99 mg/dL  Glucose, capillary     Status: Abnormal   Collection Time: 04/11/15  8:06 AM  Result Value Ref Range   Glucose-Capillary 171 (H) 65 - 99 mg/dL     Studies/Results: No results found.  . cefTRIAXone (ROCEPHIN)  IV  2 g Intravenous Q24H  . insulin aspart  0-15 Units Subcutaneous 6 times per day  . pantoprazole (PROTONIX) IV  40 mg Intravenous QHS     Assessment/Plan: s/p Procedure(s): LAPAROSCOPIC CHOLECYSTECTOMY   POD #1.  Laparoscopic cholecystectomy for acute cholecystitis with cholelithiasis Stable but nausea is a problem.  Not ready for discharge this morning We'll switch morphine to fentanyl, and Percocet to tramadol Home this afternoon or tomorrow.  @PROBHOSP @     [name] M 04/11/2015  . .prob

## 2015-04-12 LAB — GLUCOSE, CAPILLARY
GLUCOSE-CAPILLARY: 151 mg/dL — AB (ref 65–99)
Glucose-Capillary: 123 mg/dL — ABNORMAL HIGH (ref 65–99)
Glucose-Capillary: 144 mg/dL — ABNORMAL HIGH (ref 65–99)
Glucose-Capillary: 174 mg/dL — ABNORMAL HIGH (ref 65–99)

## 2015-04-12 MED ORDER — TRAMADOL HCL 50 MG PO TABS: 50.0000 mg | ORAL_TABLET | Freq: Two times a day (BID) | ORAL | Status: DC | PRN

## 2015-04-12 MED ORDER — ONDANSETRON 4 MG PO TBDP: 4.0000 mg | ORAL_TABLET | Freq: Four times a day (QID) | ORAL | Status: DC | PRN

## 2015-04-12 NOTE — Discharge Summary (Signed)
Patient ID: [name] [id number] 40 y.o. 1976/02/02  Admit date: 04/09/2015  Discharge date and time: 04/12/2015  Admitting Physician: [name]  Discharge Physician: [name]  Admission Diagnoses: RUQ pain [R10.11] Calculus of gallbladder without cholecystitis without obstruction [K80.20]   Discharge diagnosis: Acute cholecystitis with cholelithiasis                                    Diabetes mellitus type 2                                                         Hypertension                                     History cesarean section   Operations: Procedure(s): LAPAROSCOPIC CHOLECYSTECTOMY   Admission Condition: fair  Discharged Condition: good [name] presented to the emergency department complaining of right upper quadrant pain. Pain extends from her right upper quadrant around to her back. She has associated nausea and vomiting. She has had similar pains in the past multiple times, but it has never been this severe. She was evaluated including right upper quadrant ultrasound. This demonstrates multiple gallstones without clear evidence of cholecystitis. She continues to have right upper quadrant pain despite pain medication. I was asked to see her to address this from a surgical standpoint. Indication for Admission:   Hospital Course: The patient was admitted or IV fluid hydration, control of pain and nausea, and IV antibiotics.  On 04/10/2015 she was taken to the operating room by Dr. Coralie Meza and underwent laparoscopic cholecystectomy.  Findings were consistent with acute cholecystitis.  On postoperative day #1 she looked good but was having too much nausea to go home.  Over the next 24 hours we adjusted her pain and nausea medicines and she resumed diet.  Pain was better.  Nausea was better.  She felt ready to go home.  On the day of discharge her abdomen was soft and minimally tender.  All the wounds looked good.  There is no distention.  She  was afebrile and vital signs were stable.  She was ambulating in the halls she was given instructions in diet and activities.  She was given a prescription for Ultram and Zofran.  She was advised to call the office and set up an appointment in the [place] clinic in 3 weeks.  Consults: None  Significant Diagnostic Studies: Lab work.  Imaging studies.  Surgical pathology.  Treatments: surgery:  laparoscopic cholecystectomyion: Home  Patient Instructions:    Medication List    TAKE these medications        ondansetron 4 MG disintegrating tablet  Commonly known as:  ZOFRAN-ODT  Take 1 tablet (4 mg total) by mouth every 6 (six) hours as needed for nausea.     traMADol 50 MG tablet  Commonly known as:  ULTRAM  Take 1 tablet (50 mg total) by mouth every 12 (twelve) hours as needed for moderate pain.        Activity: activity as tolerated Diet: low fat, low cholesterol diet Wound Care: none needed  Follow-up:  With [facility] clinic in 3 weeks.  Signed: Edsel Meza.  [name], M.D., FACS General and minimally invasive surgery Breast and Colorectal Surgery  04/12/2015, 6:12 AM

## 2015-04-12 NOTE — Progress Notes (Signed)
DC instructions printed in [place], husband, daughter and pt understood DC instructions.

## 2015-04-12 NOTE — Discharge Instructions (Signed)

## 2015-04-13 ENCOUNTER — Encounter (HOSPITAL_COMMUNITY): Payer: Self-pay | Admitting: Surgery

## 2015-04-13 NOTE — Anesthesia Postprocedure Evaluation (Signed)
Anesthesia Post Note  Patient: [name]  Procedure(s) Performed: Procedure(s) (LRB): LAPAROSCOPIC CHOLECYSTECTOMY  (N/A)  Patient location during evaluation: PACU Anesthesia Type: General Level of consciousness: awake and alert Pain management: pain level controlled Vital Signs Assessment: post-procedure vital signs reviewed and stable Respiratory status: spontaneous breathing, nonlabored ventilation, respiratory function stable and patient connected to nasal cannula oxygen Cardiovascular status: blood pressure returned to baseline and stable Postop Assessment: no signs of nausea or vomiting Anesthetic complications: no    Last Vitals:  Filed Vitals:   04/11/15 2159 04/12/15 0620  BP: 122/69 126/60  Pulse: 64 59  Temp: 37.1 C 36.8 C  Resp: 17 17    Last Pain:  Filed Vitals:   04/12/15 1106  PainSc: [place] [name]

## 2015-04-24 ENCOUNTER — Ambulatory Visit: Payer: Self-pay

## 2015-06-02 ENCOUNTER — Encounter: Payer: Self-pay | Admitting: Family Medicine

## 2015-06-02 ENCOUNTER — Ambulatory Visit (INDEPENDENT_AMBULATORY_CARE_PROVIDER_SITE_OTHER): Payer: Self-pay | Admitting: Family Medicine

## 2015-06-02 VITALS — BP 125/83 | HR 91 | Temp 98.2°F | Ht 59.0 in | Wt 194.0 lb

## 2015-06-02 DIAGNOSIS — E118 Type 2 diabetes mellitus with unspecified complications: Secondary | ICD-10-CM

## 2015-06-02 LAB — POCT GLYCOSYLATED HEMOGLOBIN (HGB A1C): Hemoglobin A1C: 11.1

## 2015-06-02 MED ORDER — BLOOD GLUCOSE MONITOR KIT: PACK | Status: DC

## 2015-06-02 MED ORDER — INSULIN GLARGINE 100 UNIT/ML SOLOSTAR PEN: 15.0000 [IU] | PEN_INJECTOR | Freq: Every day | SUBCUTANEOUS | Status: DC

## 2015-06-02 NOTE — Progress Notes (Signed)
Patient ID: [name], female   DOB: 11-24-1975, 40 y.o.   MRN: [id number]   [facility] Aquilla Hacker, MD Phone: [phone]  Subjective:   # DMII - pt. With A1C 11.1 up from 10.2 in august of last year.  - has not been taking her medication. Has not been controlling her diet.  - Just got orange card to get assistance with her medication.  - Last took her medication in November of 2016.  - she says that she thinks that she will need insulin.  - She has not had polyuria or polydypsia lately.  - had vomiting last Friday, but this is all better now.  - Says she has had some vision changes. Sometimes gets blurry vision and has a hard time reading.  - No chest pain or shortness of breath.  - Has acid reflux as well.  - does not smoke, does not drink ETOH.   # Irregular Periods  - had them at our previous visit.  - Rx'd OCP's, this helped, but she ran out of the prescription and stopped taking OCP's.  - No longer having this problem.   # HCM - Needs the flu shot.  - Needs lipid panel.  - Needs CMET, CBC - Opthalmology referral.  - A1C today - Foot exam today.     - very stressed, has been in and out of the hospital with her daughter who has poorly controlled DM as well.  - Has also been in the hospital personally for surgery for gallstones .   All relevant systems were reviewed and were negative unless otherwise noted in the HPI  Past Medical History Reviewed problem list.  Medications- reviewed and updated Current Outpatient Prescriptions  Medication Sig Dispense Refill  . ondansetron (ZOFRAN-ODT) 4 MG disintegrating tablet Take 1 tablet (4 mg total) by mouth every 6 (six) hours as needed for nausea. (Patient not taking: Reported on 06/02/2015) 20 tablet 0  . traMADol (ULTRAM) 50 MG tablet Take 1 tablet (50 mg total) by mouth every 12 (twelve) hours as needed for moderate pain. (Patient not taking: Reported on 06/02/2015) 30 tablet 0   No  current facility-administered medications for this visit.   Chief complaint-noted No additions to family history Social history- patient is a non smoker  Objective: BP 125/83 mmHg  Pulse 91  Temp(Src) 98.2 F (36.8 C) (Oral)  Ht 4\' 11"  (1.499 m)  Wt 194 lb (87.998 kg)  BMI 39.16 kg/m2  LMP 05/20/2015 Gen: NAD, alert, cooperative with exam, obese female.  HEENT: NCAT, EOMI, PERRL Neck: FROM, supple CV: RRR, good S1/S2, no murmur Resp: CTABL, no wheezes, non-labored Abd: SNTND, BS present, no guarding or organomegaly Ext: No edema, warm, normal tone, moves UE/LE spontaneously Diabetic Foot Exam: diminished monofilament on R/L great toes.  Neuro: Alert and oriented, No gross deficits Skin: no rashes no lesions  Assessment/Plan:  # DMII - is very poorly controlled. Pt. Understands that she is going to need insulin. She is on board with this therapy. She would like to better control her health.  - She will start Lantus 15 units daily.  - write down CBG's 3 times / day.  - follow up appointment in 2 weeks.  - will check triglycerides.  - given glucometere rx and strips / lancets.  - Hypoglycemia protocol reviewed.   # Abnormal Uterine Bleeding  - better with OCPs - She stopped taking OCPs because she rant out of rx and orange card expired.  -  can represcribe if needed.   # HCM  - Labs at next office visit.  - A1C today.  - Foot exam done today.  - Ophtho referral for eye exam - No flu shot today.

## 2015-06-02 NOTE — Patient Instructions (Signed)
Thanks for coming in today.   We will address some of your other concerns when you return in two weeks.   We have started you on Lantus. Take 15 units each night.   Check your blood sugar three times / day with meals.   Write all of your blood sugar readings in a book or note book and bring back to clinic.   Make a follow up appointment with me in two weeks.   Verbalized hypoglycemia protocol. Pt. Agreed to seek care if persistently hypoglycemic.   Thanks for letting us take care of you.   Sincerely, Paula Compton, MD Family Medicine - PGY2

## 2015-06-08 ENCOUNTER — Other Ambulatory Visit: Payer: Self-pay

## 2015-06-08 DIAGNOSIS — E118 Type 2 diabetes mellitus with unspecified complications: Secondary | ICD-10-CM

## 2015-06-08 LAB — COMPLETE METABOLIC PANEL WITH GFR
ALT: 18 U/L (ref 6–29)
AST: 20 U/L (ref 10–30)
Albumin: 3.8 g/dL (ref 3.6–5.1)
Alkaline Phosphatase: 83 U/L (ref 33–115)
BILIRUBIN TOTAL: 0.7 mg/dL (ref 0.2–1.2)
BUN: 5 mg/dL — ABNORMAL LOW (ref 7–25)
CHLORIDE: 98 mmol/L (ref 98–110)
CO2: 27 mmol/L (ref 20–31)
CREATININE: 0.41 mg/dL — AB (ref 0.50–1.10)
Calcium: 8.9 mg/dL (ref 8.6–10.2)
GFR, Est Non African American: >89 mL/min (ref >=60)
GLUCOSE: 266 mg/dL — AB (ref 65–99)
Potassium: 4.2 mmol/L (ref 3.5–5.3)
SODIUM: 138 mmol/L (ref 135–146)
TOTAL PROTEIN: 6.4 g/dL (ref 6.1–8.1)

## 2015-06-08 LAB — LIPID PANEL
Cholesterol: 140 mg/dL (ref 125–200)
HDL: 33 mg/dL — AB (ref >=46)
LDL CALC: 70 mg/dL (ref <130)
TRIGLYCERIDES: 186 mg/dL — AB (ref <150)
Total CHOL/HDL Ratio: 4.2 Ratio (ref <=5.0)
VLDL: 37 mg/dL — AB (ref <30)

## 2015-06-08 LAB — CBC
HCT: 41.6 % (ref 36.0–46.0)
HEMOGLOBIN: 14 g/dL (ref 12.0–15.0)
MCH: 28.2 pg (ref 26.0–34.0)
MCHC: 33.7 g/dL (ref 30.0–36.0)
MCV: 83.9 fL (ref 78.0–100.0)
MPV: 9.9 fL (ref 8.6–12.4)
PLATELETS: 193 10*3/uL (ref 150–400)
RBC: 4.96 MIL/uL (ref 3.87–5.11)
RDW: 13.3 % (ref 11.5–15.5)
WBC: 6.8 10*3/uL (ref 4.0–10.5)

## 2015-06-08 NOTE — Progress Notes (Signed)
CBC,CMP AND LIPID DONE TODAY [name]

## 2015-06-09 ENCOUNTER — Telehealth: Payer: Self-pay | Admitting: Family Medicine

## 2015-06-09 NOTE — Telephone Encounter (Signed)
Called to talk with [name] about her lab results. She does not speak english, so relayed these through her daughter on the phone. Her triglycerides are high due to her uncontrolled Diabetes. She has not started the Insulin that we wanted to start her on since her office visit last week. She says it was too expensive. She has an appointment with me next week where we will address this issue.   CGM MD

## 2015-06-16 ENCOUNTER — Encounter: Payer: Self-pay | Admitting: Family Medicine

## 2015-06-16 ENCOUNTER — Ambulatory Visit (INDEPENDENT_AMBULATORY_CARE_PROVIDER_SITE_OTHER): Payer: Self-pay | Admitting: Family Medicine

## 2015-06-16 VITALS — BP 114/65 | HR 79 | Temp 98.0°F | Ht 59.0 in | Wt 193.0 lb

## 2015-06-16 DIAGNOSIS — Z0000 Encounter for general adult medical examination without abnormal findings: Secondary | ICD-10-CM

## 2015-06-16 DIAGNOSIS — Z23 Encounter for immunization: Secondary | ICD-10-CM

## 2015-06-16 DIAGNOSIS — E118 Type 2 diabetes mellitus with unspecified complications: Secondary | ICD-10-CM

## 2015-06-16 DIAGNOSIS — E1165 Type 2 diabetes mellitus with hyperglycemia: Secondary | ICD-10-CM

## 2015-06-16 DIAGNOSIS — IMO0001 Reserved for inherently not codable concepts without codable children: Secondary | ICD-10-CM

## 2015-06-16 MED ORDER — METFORMIN HCL 500 MG PO TABS: 500.0000 mg | ORAL_TABLET | Freq: Two times a day (BID) | ORAL | Status: DC

## 2015-06-16 MED ORDER — INSULIN GLARGINE 100 UNIT/ML SOLOSTAR PEN: 15.0000 [IU] | PEN_INJECTOR | Freq: Every day | SUBCUTANEOUS | Status: DC

## 2015-06-16 NOTE — Patient Instructions (Addendum)
Thanks for coming in today.   We have given you prescriptions.   Take these to the [facility] with your orange card. They will help you get them at little to no cost to you.   Continue to check your blood sugar 3 times daily.   Follow up in 2 weeks.   You will get the pneumonia shot today.   If you have any questions in the meantime let me know.   Thanks for letting us take care of you.   Sincerely, Teofilo Lupinacci, [place] - PGY 2

## 2015-06-16 NOTE — Progress Notes (Signed)
Patient ID: [name], female   DOB: 1975-06-15, 40 y.o.   MRN: [id number]   [facility] Aquilla Hacker, MD Phone: [phone]  Subjective:   # DMII follow up - has not picked up her lantus.  - Says her lantus is way too expensive.  - She has the orange card.   - She did not know about the [facility].  - She says her blood sugar has been high up to 404 last night. Have not been below the 200's. - She does admit to anorexia, sleepiness / lethargy.  - No vision changes, no nausea.  - She has not had any hypoglycemic events.  - not taking any other DMII medications.   # HCM  - needs pneumonia vaccine.  - needs ophtho follow up  All relevant systems were reviewed and were negative unless otherwise noted in the HPI  Past Medical History Reviewed problem list.  Medications- reviewed and updated Current Outpatient Prescriptions  Medication Sig Dispense Refill  . blood glucose meter kit and supplies KIT Dispense based on patient and insurance preference. Use up to four times daily as directed. (FOR ICD-9 250.00, 250.01). 1 each 0  . Insulin Glargine (LANTUS) 100 UNIT/ML Solostar Pen Inject 15 Units into the skin daily at 10 pm. 15 mL 11  . metFORMIN (GLUCOPHAGE) 500 MG tablet Take 1 tablet (500 mg total) by mouth 2 (two) times daily with a meal. 180 tablet 3  . ondansetron (ZOFRAN-ODT) 4 MG disintegrating tablet Take 1 tablet (4 mg total) by mouth every 6 (six) hours as needed for nausea. (Patient not taking: Reported on 06/02/2015) 20 tablet 0  . traMADol (ULTRAM) 50 MG tablet Take 1 tablet (50 mg total) by mouth every 12 (twelve) hours as needed for moderate pain. (Patient not taking: Reported on 06/02/2015) 30 tablet 0   No current facility-administered medications for this visit.   Chief complaint-noted No additions to family history Social history- patient is a non smoker  Objective: BP 114/65 mmHg  Pulse 79  Temp(Src) 98 F (36.7 C) (Oral)   Ht 4' 11"  (1.499 m)  Wt 193 lb (87.544 kg)  BMI 38.96 kg/m2  LMP 05/20/2015 Gen: NAD, alert, cooperative with exam HEENT: NCAT, EOMI, PERRL Neck: FROM, supple CV: RRR, good S1/S2, no murmur Resp: CTABL, no wheezes, non-labored Abd: SNTND, BS present, no guarding or organomegaly Ext: No edema, warm, normal tone, moves UE/LE spontaneously Neuro: Alert and oriented, No gross deficits Skin: no rashes no lesions  Assessment/Plan: See problem based a/p  Diabetes mellitus, type 2 Pt. Here for follow up after attempt to start on lantus last week. Has not picked up her lantus.Did not know about [facility]. She has the orange card. She is aware of how to use Lantus pen as her daughter uses this. CBG's in the 400's. Remains poorly controlled.  - Start Lantus 15 units daily.  - Start Metformin 500 BID - [facility] to [facility] to get [facility]s.  - Follow up in 2 weeks here.  - CMET/CBC drawn at last appointment. No significant abnormalities.  - Triglycerides slightly up. Tx at this time is Glucose control.    Healthcare maintenance Needs Flu shot - declines Needs Pneumonia Vaccine Needs DMII Retinopathy screening - referral placed.

## 2015-06-16 NOTE — Addendum Note (Signed)
Addended by: Therese Sarah B on: 06/16/2015 02:33 PM   Modules accepted: Orders

## 2015-06-16 NOTE — Assessment & Plan Note (Addendum)
Pt. Here for follow up after attempt to start on lantus last week. Has not picked up her lantus.Did not know about [facility]. She has the orange card. She is aware of how to use Lantus pen as her daughter uses this. CBG's in the 400's. Remains poorly controlled.  - Start Lantus 15 units daily.  - Start Metformin 500 BID - [facility] to get [facility]s.  - Follow up in 2 weeks here.  - CMET/CBC drawn at last appointment. No significant abnormalities.  - Triglycerides slightly up. Tx at this time is Glucose control.

## 2015-06-16 NOTE — Assessment & Plan Note (Signed)
Needs Flu shot - declines Needs Pneumonia Vaccine Needs DMII Retinopathy screening - referral placed.

## 2015-07-03 ENCOUNTER — Ambulatory Visit (INDEPENDENT_AMBULATORY_CARE_PROVIDER_SITE_OTHER): Payer: Self-pay | Admitting: Family Medicine

## 2015-07-03 ENCOUNTER — Encounter: Payer: Self-pay | Admitting: Family Medicine

## 2015-07-03 VITALS — BP 121/59 | HR 75 | Temp 98.4°F | Ht 59.0 in | Wt 194.0 lb

## 2015-07-03 DIAGNOSIS — E118 Type 2 diabetes mellitus with unspecified complications: Secondary | ICD-10-CM

## 2015-07-03 MED ORDER — INSULIN GLARGINE 100 UNIT/ML SOLOSTAR PEN: 20.0000 [IU] | PEN_INJECTOR | Freq: Every day | SUBCUTANEOUS | Status: DC

## 2015-07-03 NOTE — Progress Notes (Signed)
Patient ID: [name], female   DOB: 05/25/1975, 40 y.o.   MRN: [id number]   [facility] Aquilla Hacker, MD Phone: [phone]  Subjective:   # DMII Follow Up - She has not gotten her insulin yet.  - She has the orange card.  - [facility] is still requesting that she provide a notarized letter of her financial support before they will give her her medicine.  - She continues to have CBG's in 300's / 400's routinely on daily basis.  - Reviewed glucometer readings, glucose as high as 500 since I saw her last.  - She has been symptomatic with her CBG's as high as they have been. She gets very tired and very sleepy / lethargic.  - no episodes of hypoglycemia.  - drinking lots of water, and urinating frequently.   All relevant systems were reviewed and were negative unless otherwise noted in the HPI  Past Medical History Reviewed problem list.  Medications- reviewed and updated Current Outpatient Prescriptions  Medication Sig Dispense Refill  . blood glucose meter kit and supplies KIT Dispense based on patient and insurance preference. Use up to four times daily as directed. (FOR ICD-9 250.00, 250.01). 1 each 0  . Insulin Glargine (LANTUS) 100 UNIT/ML Solostar Pen Inject 15 Units into the skin daily at 10 pm. 15 mL 11  . metFORMIN (GLUCOPHAGE) 500 MG tablet Take 1 tablet (500 mg total) by mouth 2 (two) times daily with a meal. 180 tablet 3  . ondansetron (ZOFRAN-ODT) 4 MG disintegrating tablet Take 1 tablet (4 mg total) by mouth every 6 (six) hours as needed for nausea. (Patient not taking: Reported on 06/02/2015) 20 tablet 0  . traMADol (ULTRAM) 50 MG tablet Take 1 tablet (50 mg total) by mouth every 12 (twelve) hours as needed for moderate pain. (Patient not taking: Reported on 06/02/2015) 30 tablet 0   No current facility-administered medications for this visit.   Chief complaint-noted No additions to family history Social history- patient is a non  smoker  Objective: BP 121/59 mmHg  Pulse 75  Temp(Src) 98.4 F (36.9 C) (Oral)  Ht 4' 11" (1.499 m)  Wt 194 lb (87.998 kg)  BMI 39.16 kg/m2  LMP 06/26/2015 (Approximate) Gen: NAD, alert, cooperative with exam HEENT: NCAT, EOMI, PERRL Neck: FROM, supple CV: RRR, good S1/S2, no murmur Resp: CTABL, no wheezes, non-labored Abd: SNTND, BS present, no guarding or organomegaly Ext: No edema, warm, normal tone, moves UE/LE spontaneously Neuro: Alert and oriented, No gross deficits Skin: no rashes no lesions  Assessment/Plan: Diabetes mellitus, type 2 Pt. Still without medicine. She has been unable to get her Insulin at the [facility] apparently due to a requirement of a notarized letter certifying her source of income. Bottom line - glucose is incredibly high. She is only getting the metformin. She needs her medicine. She wants to treat herself, but is unable right now. This is very unfortunate and difficult for her.  - We need to get the financial / medicine situation figured out ASAP.  - Sending her for meeting with our financial counselor here.  - Continue metformin for now.  - Given coupon for free lantus pen.  - Also given coupon for $10 lantus pen. These should cover her for 2 months until we get her financial situation / [facility] straightened out.

## 2015-07-03 NOTE — Assessment & Plan Note (Signed)
Pt. Still without medicine. She has been unable to get her Insulin at the [facility] apparently due to a requirement of a notarized letter certifying her source of income. Bottom line - glucose is incredibly high. She is only getting the metformin. She needs her medicine. She wants to treat herself, but is unable right now. This is very unfortunate and difficult for her.  - We need to get the financial / medicine situation figured out ASAP.  - Sending her for meeting with our financial counselor here.  - Continue metformin for now.  - Given coupon for free lantus pen.  - Also given coupon for $10 lantus pen. These should cover her for 2 months until we get her financial situation / [facility] straightened out.

## 2015-07-03 NOTE — Patient Instructions (Signed)
Thanks for coming in today.   We have given you a coupon so that you can get the lantus to treat your diabetes.   STart taking the lantus right away.   Follow up here in 3 weeks to see how things are going.   Meet with the financial counselor to staraighten out the situation with the [facility].   Thanks for letting us take care of you.   Sincerely, Paula Compton, MD

## 2015-07-24 ENCOUNTER — Ambulatory Visit: Payer: Self-pay | Admitting: Family Medicine

## 2015-08-12 ENCOUNTER — Encounter: Payer: Self-pay | Admitting: Family Medicine

## 2015-08-12 ENCOUNTER — Ambulatory Visit (INDEPENDENT_AMBULATORY_CARE_PROVIDER_SITE_OTHER): Payer: Self-pay | Admitting: Family Medicine

## 2015-08-12 VITALS — BP 123/85 | HR 73 | Temp 98.2°F | Wt 205.2 lb

## 2015-08-12 DIAGNOSIS — N939 Abnormal uterine and vaginal bleeding, unspecified: Secondary | ICD-10-CM

## 2015-08-12 LAB — POCT URINALYSIS DIPSTICK
BILIRUBIN UA: NEGATIVE
Blood, UA: NEGATIVE
Glucose, UA: NEGATIVE
KETONES UA: NEGATIVE
LEUKOCYTES UA: NEGATIVE
Nitrite, UA: NEGATIVE
PH UA: 6
Protein, UA: NEGATIVE
Spec Grav, UA: 1.025
Urobilinogen, UA: 0.2

## 2015-08-12 LAB — POCT URINE PREGNANCY: PREG TEST UR: NEGATIVE

## 2015-08-12 NOTE — Progress Notes (Signed)
Patient ID: [name], female   DOB: [birth year], 40 y.o.   MRN: [id number]   [facility] Aquilla Hacker, MD Phone: [phone]  Subjective:   # Irregular period - she has had 3 episodes of abnormal uterine bleeding this past month.  - She normally has her period every month  - She has had irregular periods in the past.  - She says that she has intermittent heavy periods and had taken OCP's in the past which helped her symptoms, but stopped taking OCPs because she stopped having a period.  - this is not a new symptom for her, but the frequency is increasing.  - she reports no chance she could be pregnant. Her tubes are tied.  - She reports some burning with urination at times but associates this with DMII. No suprapubic pain, no fevers.  - Sister had hysterectomy for unknown issue, and other sister has had cryotherapy of uterus also for an unknown issue.  - Evaluated in 8/16 of last year, with essentially normal pelvic U/S and she was started on OCP's which controlled her symptoms at the time. She stopped taking the OCP's because she "did not have a period" and her symptoms returned. She is now worried about her bleeding again.  - However she has had normal monthly periods upon stopping OCP's until this month.  - No weight loss no night sweats.   All relevant systems were reviewed and were negative unless otherwise noted in the HPI  Past Medical History Reviewed problem list.  Medications- reviewed and updated Current Outpatient Prescriptions  Medication Sig Dispense Refill  . blood glucose meter kit and supplies KIT Dispense based on patient and insurance preference. Use up to four times daily as directed. (FOR ICD-9 250.00, 250.01). 1 each 0  . Insulin Glargine (LANTUS) 100 UNIT/ML Solostar Pen Inject 20 Units into the skin daily at 10 pm. 15 mL 11  . metFORMIN (GLUCOPHAGE) 500 MG tablet Take 1 tablet (500 mg total) by mouth 2 (two) times daily with a  meal. 180 tablet 3  . ondansetron (ZOFRAN-ODT) 4 MG disintegrating tablet Take 1 tablet (4 mg total) by mouth every 6 (six) hours as needed for nausea. (Patient not taking: Reported on 06/02/2015) 20 tablet 0  . traMADol (ULTRAM) 50 MG tablet Take 1 tablet (50 mg total) by mouth every 12 (twelve) hours as needed for moderate pain. (Patient not taking: Reported on 06/02/2015) 30 tablet 0   No current facility-administered medications for this visit.   Chief complaint-noted No additions to family history Social history- patient is a non smoker  Objective: BP 123/85 mmHg  Pulse 73  Temp(Src) 98.2 F (36.8 C) (Oral)  Wt 205 lb 3.2 oz (93.078 kg)  SpO2 100%  LMP 07/21/2015 Gen: NAD, alert, cooperative with exam HEENT: NCAT, EOMI, PERRL Neck: FROM, supple CV: RRR, good S1/S2, no murmur Resp: CTABL, no wheezes, non-labored Abd: SNTND, BS present, no guarding or organomegaly Ext: No edema, warm, normal tone, moves UE/LE spontaneously Neuro: Alert and oriented, No gross deficits Skin: no rashes no lesions  Assessment/Plan:  # Abnormal / Irregular Uterine Bleeding - She is having recurrence of her bleeding. This seemed to be well controlled previously on OCP's and seems to have been a chronic problem, however her bleeding is different now and occurring multiple times / month rather than just menorrhagia with regular periods. [place] last year was reassuring for the most part, but did have endometrium on the thicker side.  -  May benefit from EMB prior to resumption of OCP's  - She may also be early perimenopausal - would favor resumption of OCP's if EMB normal.  - Refer for EMB to Dr. Sherril Cong.

## 2015-08-12 NOTE — Patient Instructions (Signed)
Thanks for coming in today.   Schedule an appointment with Dr. Sherril Cong for Endometrial Biopsy.   If the endometrial biopsy is normal will resume pills.   Thanks for letting us take care of you.   Paula Compton, MD Family Medicine -PGY 2

## 2015-08-12 NOTE — Progress Notes (Signed)
It looks like this got scheduled in your next colpo clinic on the 8th and you don't have a resident that day so is it ok if I come do it to complete my final graduation requirement? Thanks!

## 2015-08-17 ENCOUNTER — Ambulatory Visit (INDEPENDENT_AMBULATORY_CARE_PROVIDER_SITE_OTHER): Payer: Self-pay | Admitting: Family Medicine

## 2015-08-17 ENCOUNTER — Encounter: Payer: Self-pay | Admitting: Family Medicine

## 2015-08-17 VITALS — BP 137/74 | HR 67 | Temp 97.9°F | Ht 59.0 in | Wt 204.6 lb

## 2015-08-17 DIAGNOSIS — N939 Abnormal uterine and vaginal bleeding, unspecified: Secondary | ICD-10-CM

## 2015-08-17 DIAGNOSIS — N921 Excessive and frequent menstruation with irregular cycle: Secondary | ICD-10-CM

## 2015-08-17 NOTE — Progress Notes (Addendum)
  Endometrial Biopsy Procedure Note  Pre-operative Diagnosis: menorrhagia  Post-operative Diagnosis: same  Indications: abnormal uterine bleeding  Procedure Details   Urine pregnancy test was not done. Pt is s/p BTL. The risks (including infection, bleeding, pain, and uterine perforation) and benefits of the procedure were explained to the patient and Written informed consent was obtained.  Antibiotic prophylaxis against endocarditis was not indicated.   The patient was placed in the dorsal lithotomy position.  Bimanual exam showed the uterus to be in the neutral position.  A Graves' speculum inserted in the vagina, and the cervix prepped with povidone iodine.  A sharp tenaculum was applied to the anterior lip of the cervix for stabilization.  A sterile uterine sound was used to sound the uterus to a depth of 4.5cm.  A Pipelle endometrial aspirator was used to sample the endometrium.  Sample was sent for pathologic examination.  Condition: Stable  Complications: None  Plan:  The patient was advised to call for any fever or for prolonged or severe pain or bleeding. She was advised to use NSAID as needed for mild to moderate pain. She was advised to avoid vaginal intercourse for 48 hours or until the bleeding has completely stopped.  Abnormal uterine bleeding 8 years of irregular/frequent bleeding, now 3 times per months (almost constant), normal EMB in March '16 - endometrial biopsy performed today - restart ocps if normal - f/u with pcp in 4 weeks

## 2015-08-17 NOTE — Patient Instructions (Addendum)
Biopsia de endometrio - Cuidados posteriores (Endometrial Biopsy, Care After) Siga estas instrucciones durante las prximas semanas. Estas indicaciones le proporcionan informacin general acerca de cmo deber cuidarse despus del procedimiento. El mdico tambin podr darle instrucciones ms especficas. El tratamiento se ha planificado de acuerdo a las prcticas mdicas actuales, pero a veces se producen problemas. Comunquese con el mdico si tiene algn problema o tiene dudas despus del procedimiento. QU ESPERAR DESPUS DEL PROCEDIMIENTO Despus del procedimiento, es tpico tener las siguientes sensaciones:  Sentir clicos leves y tendr una pequea cantidad de sangrado vaginal durante algunos das despus del procedimiento. Esto es normal. INSTRUCCIONES PARA EL CUIDADO EN EL HOGAR  Tome slo medicamentos de venta libre o recetados, segn las indicaciones del mdico.  No utilice tampones, duchas vaginales ni tenga relaciones sexuales hasta que el profesional la autorice.  Siga las indicaciones del mdico relacionadas con la restriccin a ciertas actividades, como ejercicios fsicos intensos o levantar objetos pesados. SOLICITE ATENCIN MDICA SI:  Tiene un sangrado abundante o sangra durante ms de 2 das despus del procedimiento.  Advierte un olor ftido que proviene de la vagina.  Siente escalofros o tiene fiebre.  Siente un dolor en el bajo vientre (abdominal) muy intenso. SOLICITE ATENCIN MDICA DE INMEDIATO SI:  Siente clicos intensos en el estmago o en la espalda.  Elimina cogulos grandes.  La hemorragia aumenta.  Se siente mareada, dbil, o se desmaya.   Esta informacin no tiene como fin reemplazar el consejo del mdico. Asegrese de hacerle al mdico cualquier pregunta que tenga.   Document Released: 12/19/2012 Elsevier Interactive Patient Education 2016 [employer]  

## 2015-08-17 NOTE — Addendum Note (Signed)
Addended by: Frazier Richards on: 08/17/2015 12:32 PM   Modules accepted: Orders

## 2015-08-17 NOTE — Assessment & Plan Note (Addendum)
8 years of irregular/frequent bleeding, now 3 times per months (almost constant), normal EMB in March '16 - endometrial biopsy performed today - restart ocps if normal - f/u with pcp in 4 weeks

## 2015-08-20 ENCOUNTER — Ambulatory Visit: Payer: Medicaid Other

## 2016-07-05 ENCOUNTER — Emergency Department (HOSPITAL_COMMUNITY): Payer: Self-pay

## 2016-07-05 ENCOUNTER — Encounter: Payer: Self-pay | Admitting: Pediatric Intensive Care

## 2016-07-05 ENCOUNTER — Emergency Department (HOSPITAL_COMMUNITY)
Admission: EM | Admit: 2016-07-05 | Discharge: 2016-07-06 | Disposition: A | Discharge status: Home / Self Care | Payer: Self-pay | Attending: Emergency Medicine | Admitting: Emergency Medicine

## 2016-07-05 ENCOUNTER — Encounter (HOSPITAL_COMMUNITY): Payer: Self-pay | Admitting: Emergency Medicine

## 2016-07-05 DIAGNOSIS — N12 Tubulo-interstitial nephritis, not specified as acute or chronic: Secondary | ICD-10-CM | POA: Insufficient documentation

## 2016-07-05 DIAGNOSIS — Z7984 Long term (current) use of oral hypoglycemic drugs: Secondary | ICD-10-CM | POA: Insufficient documentation

## 2016-07-05 DIAGNOSIS — N3091 Cystitis, unspecified with hematuria: Secondary | ICD-10-CM

## 2016-07-05 DIAGNOSIS — E1140 Type 2 diabetes mellitus with diabetic neuropathy, unspecified: Secondary | ICD-10-CM | POA: Insufficient documentation

## 2016-07-05 DIAGNOSIS — I10 Essential (primary) hypertension: Secondary | ICD-10-CM | POA: Insufficient documentation

## 2016-07-05 DIAGNOSIS — E118 Type 2 diabetes mellitus with unspecified complications: Secondary | ICD-10-CM

## 2016-07-05 DIAGNOSIS — N3001 Acute cystitis with hematuria: Secondary | ICD-10-CM | POA: Insufficient documentation

## 2016-07-05 LAB — URINALYSIS, ROUTINE W REFLEX MICROSCOPIC
Bilirubin Urine: NEGATIVE
Glucose, UA: >=500 mg/dL — AB
KETONES UR: NEGATIVE mg/dL
NITRITE: NEGATIVE
PH: 7 (ref 5.0–8.0)
PROTEIN: 100 mg/dL — AB
Specific Gravity, Urine: 1.033 — ABNORMAL HIGH (ref 1.005–1.030)

## 2016-07-05 LAB — GLUCOSE, POCT (MANUAL RESULT ENTRY): POC GLUCOSE: 374 mg/dL — AB (ref 70–99)

## 2016-07-05 LAB — COMPREHENSIVE METABOLIC PANEL
ALK PHOS: 104 U/L (ref 38–126)
ALT: 28 U/L (ref 14–54)
ANION GAP: 8 (ref 5–15)
AST: 31 U/L (ref 15–41)
Albumin: 3.6 g/dL (ref 3.5–5.0)
BILIRUBIN TOTAL: 0.4 mg/dL (ref 0.3–1.2)
BUN: 10 mg/dL (ref 6–20)
CALCIUM: 8.8 mg/dL — AB (ref 8.9–10.3)
CO2: 23 mmol/L (ref 22–32)
Chloride: 106 mmol/L (ref 101–111)
Creatinine, Ser: 0.41 mg/dL — ABNORMAL LOW (ref 0.44–1.00)
GLUCOSE: 343 mg/dL — AB (ref 65–99)
Potassium: 4.1 mmol/L (ref 3.5–5.1)
Sodium: 137 mmol/L (ref 135–145)
TOTAL PROTEIN: 6.9 g/dL (ref 6.5–8.1)

## 2016-07-05 LAB — CBC
HCT: 40.7 % (ref 36.0–46.0)
Hemoglobin: 13.8 g/dL (ref 12.0–15.0)
MCH: 27.4 pg (ref 26.0–34.0)
MCHC: 33.9 g/dL (ref 30.0–36.0)
MCV: 80.8 fL (ref 78.0–100.0)
Platelets: 235 10*3/uL (ref 150–400)
RBC: 5.04 MIL/uL (ref 3.87–5.11)
RDW: 13.2 % (ref 11.5–15.5)
WBC: 6.3 10*3/uL (ref 4.0–10.5)

## 2016-07-05 LAB — LIPASE, BLOOD: Lipase: 32 U/L (ref 11–51)

## 2016-07-05 MED ORDER — OXYCODONE-ACETAMINOPHEN 5-325 MG PO TABS: 1.0000 | ORAL_TABLET | Freq: Four times a day (QID) | ORAL | 0 refills | Status: DC | PRN

## 2016-07-05 MED ORDER — CEPHALEXIN 500 MG PO CAPS: 500.0000 mg | ORAL_CAPSULE | Freq: Four times a day (QID) | ORAL | 0 refills | Status: DC

## 2016-07-05 MED ORDER — ONDANSETRON HCL 4 MG/2ML IJ SOLN
4.0000 mg | Freq: Once | INTRAMUSCULAR | Status: AC
  Administered 2016-07-05: 4 mg via INTRAVENOUS
  Filled 2016-07-05: qty 2

## 2016-07-05 MED ORDER — DEXTROSE 5 % IV SOLN
1.0000 g | Freq: Once | INTRAVENOUS | Status: AC
  Administered 2016-07-05: 1 g via INTRAVENOUS
  Filled 2016-07-05: qty 10

## 2016-07-05 MED ORDER — KETOROLAC TROMETHAMINE 30 MG/ML IJ SOLN
30.0000 mg | Freq: Once | INTRAMUSCULAR | Status: AC
  Administered 2016-07-05: 30 mg via INTRAVENOUS
  Filled 2016-07-05: qty 1

## 2016-07-05 MED ORDER — PROMETHAZINE HCL 25 MG PO TABS: 25.0000 mg | ORAL_TABLET | Freq: Four times a day (QID) | ORAL | 0 refills | Status: DC | PRN

## 2016-07-05 MED ORDER — OXYCODONE-ACETAMINOPHEN 5-325 MG PO TABS
2.0000 | ORAL_TABLET | Freq: Once | ORAL | Status: AC
  Administered 2016-07-05: 2 via ORAL
  Filled 2016-07-05: qty 2

## 2016-07-05 MED ORDER — SODIUM CHLORIDE 0.9 % IV BOLUS (SEPSIS)
1000.0000 mL | Freq: Once | INTRAVENOUS | Status: AC
  Administered 2016-07-05: 1000 mL via INTRAVENOUS

## 2016-07-05 MED ORDER — IBUPROFEN 600 MG PO TABS: 600.0000 mg | ORAL_TABLET | Freq: Four times a day (QID) | ORAL | 0 refills | Status: DC | PRN

## 2016-07-05 NOTE — Discharge Instructions (Signed)
Sus exmenes muestran que tiene una infeccin en la vejiga. Esto probablemente cause irritacin y [employer]. Su dolor de espalda probablemente se deba a una pielonefritis debido a que la infeccin se est moviendo hacia su rin. Le recomendamos que tome Keflex segn lo recetado hasta que termine de tratar esta infeccin. No te pierdas ninguna dosis Puede tomar ibuprofeno para el dolor y phenergan para las nuseas. Si el dolor es severo, puede tomar Percocet segn lo recetado. Haga un seguimiento con un mdico de atencin primaria para asegurarse de que los sntomas se resuelvan. Puede regresar al departamento de emergencias por sntomas nuevos o preocupantes.  Your tests show that you have a bladder infection. This is likely causing irritation and blood in your urine. Your back pain is likely due to pyelonephritis from the infection moving up to your kidney. We advise that you take Keflex as prescribed until finished to treat this infection. Do not miss any doses. You may take ibuprofen for pain and phenergan for nausea. If pain is severe, you may take Percocet as prescribed. Follow up with a primary care doctor to ensure that symptoms resolve. You may return to the ED for new or concerning symptoms.

## 2016-07-05 NOTE — ED Provider Notes (Signed)
[facility] DEPT Provider Note   CSN: [id number] Arrival date & time: 07/05/16  1648    History   Chief Complaint Chief Complaint  Patient presents with  . Abdominal Pain    HPI [name] is a 41 y.o. female.  41 year old female with a history of hypertension, diabetes mellitus, esophageal reflux, and depression presents to the emergency department for worsening dysuria and abdominal pain. Patient reports 10 days of painful urination. This has persistently worsened and become associated with hematuria over the last 2-3 days. Patient has also noticed some sediment in her urine recently. She took ibuprofen for pain without relief. Pain will intimately radiate to her back. She has had associated nausea with sporadic emesis. She last reports dry heaving this morning. Patient without documented fever, she has had chills. No bowel changes. Abdominal surgical history significant for cesarean section as well as cholecystectomy.   The history is provided by the patient. No language interpreter was used.  Abdominal Pain      Past Medical History:  Diagnosis Date  . Chronic chest pain   . Chronic pelvic pain in female   . DDD (degenerative disc disease), cervical   . Depression   . Diabetes mellitus   . Dizziness   . GERD (gastroesophageal reflux disease)   . H/O echocardiogram 09/2013   normal  . Headache   . Hypertension   . Peripheral neuropathy     Patient Active Problem List   Diagnosis Date Noted  . Healthcare maintenance 06/16/2015  . Symptomatic cholelithiasis 04/10/2015  . Abnormal uterine bleeding 10/30/2014  . Lower extremity edema 05/21/2014  . Chest pain 04/25/2014  . Diabetic neuropathy ([place]) 04/25/2014  . Pelvic pain in female 03/27/2014  . Yeast infection 03/27/2014  . GI bleed 03/19/2014  . Abdominal pain, epigastric 03/19/2014  . Cholelithiasis 02/03/2014  . Blood in stool 11/08/2013  . Finger pain, left 10/21/2013  . Ingrown toenail  10/08/2013  . Numbness of right side of body 10/08/2013  . Tick bite 07/31/2013  . Pain in finger of right hand 01/07/2013  . Anemia 12/21/2012  . Rash and nonspecific skin eruption 12/05/2012  . Neck pain 10/17/2012  . Bilateral calf pain 08/21/2012  . Numbness of face 08/21/2012  . Seasonal allergies 07/26/2012  . Dysuria 07/26/2012  . Pain in joint, lower leg 06/20/2012  . Back pain 04/20/2012  . Hepatic steatosis 10/01/2011  . Depression 05/26/2011  . Hypertension 05/26/2011  . Abdominal pain, generalized 10/13/2010  . GERD 09/17/2008  . Morbid obesity ([place]) 07/23/2008  . Diabetes mellitus, type 2 ([place]) 02/20/2007    Past Surgical History:  Procedure Laterality Date  . CESAREAN SECTION    . CHOLECYSTECTOMY N/A 04/10/2015   Procedure: LAPAROSCOPIC CHOLECYSTECTOMY ;  Surgeon: Coralie Keens, MD;  Location: [facility];  Service: General;  Laterality: N/A;  . COLONOSCOPY N/A 04/07/2014   YCX:KGYJE HH/EPIGASTRIC PAIN/SMALL INTERNAL HEMORRHOIDS/MILD DIVERTICULOSIS IN LEFT COLON  . ESOPHAGOGASTRODUODENOSCOPY N/A 04/07/2014   HUD:JSHFW HH/EPIGASTRIC PAIN/SMALL INTERNAL HEMORRHOIDS/MILD DIVERTICULOSIS IN LEFT COLON  . TUBAL LIGATION      OB History    Gravida Para Term Preterm AB Living   _0 SAB TAB Ectopic Multiple Live Births                   Home Medications    Prior to Admission medications   Medication Sig Start Date End Date Taking? Authorizing Provider  blood glucose meter kit and supplies  KIT Dispense based on patient and insurance preference. Use up to four times daily as directed. (FOR ICD-9 250.00, 250.01). 06/02/15   Aquilla Hacker, MD  cephALEXin (KEFLEX) 500 MG capsule Take 1 capsule (500 mg total) by mouth 4 (four) times daily. 07/05/16   Antonietta Breach, PA-C  ibuprofen (ADVIL,MOTRIN) 600 MG tablet Take 1 tablet (600 mg total) by mouth every 6 (six) hours as needed. 07/05/16   Antonietta Breach, PA-C  Insulin Glargine (LANTUS) 100 UNIT/ML Solostar Pen Inject 20  Units into the skin daily at 10 pm. 07/03/15   Aquilla Hacker, MD  metFORMIN (GLUCOPHAGE) 500 MG tablet Take 1 tablet (500 mg total) by mouth 2 (two) times daily with a meal. 06/16/15   Aquilla Hacker, MD  oxyCODONE-acetaminophen (PERCOCET/ROXICET) 5-325 MG tablet Take 1 tablet by mouth every 6 (six) hours as needed for severe pain. 07/05/16   Antonietta Breach, PA-C  promethazine (PHENERGAN) 25 MG tablet Take 1 tablet (25 mg total) by mouth every 6 (six) hours as needed for nausea or vomiting. 07/05/16   Antonietta Breach, PA-C    Family History Family History  Problem Relation Age of Onset  . Hypertension Mother   . Diabetes Father   . Colon cancer Neg Hx     Social History Social History  Substance Use Topics  . Smoking status: Never Smoker  . Smokeless tobacco: Never Used  . Alcohol use No     Allergies   Patient has no known allergies.   Review of Systems Review of Systems  Gastrointestinal: Positive for abdominal pain.  Ten systems reviewed and are negative for acute change, except as noted in the HPI.    Physical Exam Updated Vital Signs BP 113/76   Pulse 64   Temp 98 F (36.7 C) (Oral)   Resp 18   Ht 5' (1.524 m)   Wt 91.4 kg   SpO2 100%   BMI 39.37 kg/m   Physical Exam  Constitutional: She is oriented to person, place, and time. She appears well-developed and well-nourished. No distress.  Nontoxic and in NAD  HENT:  Head: Normocephalic and atraumatic.  Eyes: Conjunctivae and EOM are normal. No scleral icterus.  Neck: Normal range of motion.  Cardiovascular: Normal rate, regular rhythm and intact distal pulses.   Pulmonary/Chest: Effort normal. No respiratory distress. She has no wheezes.  Respirations even and unlabored  Abdominal: Soft. She exhibits no mass. There is tenderness. There is no guarding.  Soft obese abdomen. There is mild generalized TTP; focal TTP noted suprapubically. No peritoneal signs or guarding.  Musculoskeletal: Normal range of motion.    Neurological: She is alert and oriented to person, place, and time. She exhibits normal muscle tone. Coordination normal.  Skin: Skin is warm and dry. No rash noted. She is not diaphoretic. No erythema. No pallor.  Psychiatric: She has a normal mood and affect. Her behavior is normal.  Nursing note and vitals reviewed.    ED Treatments / Results  Labs (all labs ordered are listed, but only abnormal results are displayed) Labs Reviewed  COMPREHENSIVE METABOLIC PANEL - Abnormal; Notable for the following:       Result Value   Glucose, Bld 343 (*)    Creatinine, Ser 0.41 (*)    Calcium 8.8 (*)    All other components within normal limits  URINALYSIS, ROUTINE W REFLEX MICROSCOPIC - Abnormal; Notable for the following:    APPearance CLOUDY (*)    Specific Gravity, Urine 1.033 (*)  Glucose, UA >=500 (*)    Hgb urine dipstick MODERATE (*)    Protein, ur 100 (*)    Leukocytes, UA LARGE (*)    Bacteria, UA RARE (*)    Squamous Epithelial / LPF 6-30 (*)    All other components within normal limits  URINE CULTURE  LIPASE, BLOOD  CBC  CBG MONITORING, ED    EKG  EKG Interpretation None       Radiology US Renal  Result Date: 07/05/2016 CLINICAL DATA:  Hematuria and pyuria EXAM: RENAL / URINARY TRACT ULTRASOUND COMPLETE COMPARISON:  None. FINDINGS: Right Kidney: Length: 12.5 cm. Echogenicity within normal limits. No mass or hydronephrosis visualized. Left Kidney: Length: 12.7 cm. Echogenicity within normal limits. No mass or hydronephrosis visualized. Bladder: Appears normal for degree of bladder distention. IMPRESSION: Normal renal ultrasound. Electronically Signed   By: Ulyses Jarred M.D.   On: 07/05/2016 22:30    Procedures Procedures (including critical care time)  Medications Ordered in ED Medications  cefTRIAXone (ROCEPHIN) 1 g in dextrose 5 % 50 mL IVPB (0 g Intravenous Stopped 07/05/16 2209)  ketorolac (TORADOL) 30 MG/ML injection 30 mg (30 mg Intravenous Given 07/05/16  2144)  ondansetron (ZOFRAN) injection 4 mg (4 mg Intravenous Given 07/05/16 2144)  sodium chloride 0.9 % bolus 1,000 mL (1,000 mLs Intravenous New Bag/Given 07/05/16 2308)  oxyCODONE-acetaminophen (PERCOCET/ROXICET) 5-325 MG per tablet 2 tablet (2 tablets Oral Given 07/05/16 2349)     Initial Impression / Assessment and Plan / ED Course  I have reviewed the triage vital signs and the nursing notes.  Pertinent labs & imaging results that were available during my care of the patient were reviewed by me and considered in my medical decision making (see chart for details).     41 year old female presents to the emergency department for dysuria 10 days with progression to flank pain. She notes hematuria over the past few days. Suspect hematuria to be secondary to hemorrhagic cystitis. Urinalysis also with pyuria, suggestive of infection. Patient does not be sepsis criteria. She is afebrile without leukocytosis. Kidney function preserved.  Renal ultrasound performed which shows no evidence of hydronephrosis or abscess. No obstructing or nonobstructing kidney stones seen. Patient has been treated with IV Rocephin as well as IV fluids. Urine culture ordered. Plan for continued outpatient management with Keflex. The patient has been advised to follow-up with her primary care doctor to ensure resolution of symptoms. Return precautions discussed and provided. Patient discharged in stable condition with no unaddressed concerns.   Final Clinical Impressions(s) / ED Diagnoses   Final diagnoses:  Pyelonephritis  Hemorrhagic cystitis    New Prescriptions New Prescriptions   CEPHALEXIN (KEFLEX) 500 MG CAPSULE    Take 1 capsule (500 mg total) by mouth 4 (four) times daily.   IBUPROFEN (ADVIL,MOTRIN) 600 MG TABLET    Take 1 tablet (600 mg total) by mouth every 6 (six) hours as needed.   OXYCODONE-ACETAMINOPHEN (PERCOCET/ROXICET) 5-325 MG TABLET    Take 1 tablet by mouth every 6 (six) hours as needed for  severe pain.   PROMETHAZINE (PHENERGAN) 25 MG TABLET    Take 1 tablet (25 mg total) by mouth every 6 (six) hours as needed for nausea or vomiting.     Antonietta Breach, PA-C 07/06/16 0023    Julianne Rice, MD 07/06/16 [phone]

## 2016-07-05 NOTE — Congregational Nurse Program (Signed)
Congregational Nurse Program Note  Date of Encounter: 07/05/2016  Past Medical History: Past Medical History:  Diagnosis Date  . Chronic chest pain   . Chronic pelvic pain in female   . DDD (degenerative disc disease), cervical   . Depression   . Diabetes mellitus   . Dizziness   . GERD (gastroesophageal reflux disease)   . H/O echocardiogram 09/2013   normal  . Headache   . Hypertension   . Peripheral neuropathy [facility])     Encounter Details:     CNP Questionnaire - 07/05/16 1600      Patient Demographics   Is this a new or existing patient? New   Patient is considered a/an Immigrant   Race Latino/Hispanic     Patient Assistance   Location of Patient Assistance [employer]   Patient's financial/insurance status Self-Pay (Uninsured)   Uninsured Patient ([facility]) Yes   Interventions Referred to ED/Urgent Care   Patient referred to apply for the following financial assistance [employer] insecurities addressed Not Applicable   Transportation assistance No   Assistance securing medications No   Educational health offerings Not Applicable     Encounter Details   Primary purpose of visit Education/Health Concerns;Acute Illness/Condition Visit;Chronic Illness/Condition Visit;Navigating the Healthcare System   Was an Emergency Department visit averted? No   Does patient have a medical provider? Yes   Patient referred to Emergency Department   Was a mental health screening completed? (GAINS tool) No   Does patient have dental issues? No   Does patient have vision issues? No   Does your patient have an abnormal blood pressure today? Yes   Since previous encounter, have you referred patient for abnormal blood pressure that resulted in a new diagnosis or medication change? No   Does your patient have an abnormal blood glucose today? Yes   Since previous encounter, have you referred patient for abnormal blood glucose that resulted  in a new diagnosis or medication change? No   Was there a life-saving intervention made? No     New client- via interpreter [name], client states that she has been having heavy bleeding with "stomach pain" for approximately 2 weeks. Client points to low midline abdomen to epigastrum for location of pain. Client states pain is very bad and she has a difficult time talking when it comes. Client states she has been taking ibuprofen 200mg x2 every 4-8 hours for pain for about 10 days. Client states that she has been nauseous and has vomited blood 3 times recently. Client states that she has taken insulin in the past for diabetes but does not have resources to pay for medication now. CN directed client with husband to go to [facility] ED. Client states that she is scared to go to ED as she is worried no one will be available to interpret. Lauren, [employer] caseworker, will accompany client to ED to get her checked into triage.

## 2016-07-05 NOTE — ED Triage Notes (Signed)
Pt Arrives to ED with lower abd pain that also goes in to right flank. Pt reports that she had an episode of vomiting blood 3 days ago.

## 2016-07-08 LAB — URINE CULTURE

## 2016-07-09 ENCOUNTER — Telehealth: Payer: Self-pay

## 2016-07-09 NOTE — Telephone Encounter (Signed)
Post ED Visit - Positive Culture Follow-up  Culture report reviewed by antimicrobial stewardship pharmacist:  []  Elenor Quinones, Pharm.D. []  Heide Guile, Pharm.D., BCPS AQ-ID []  Parks Neptune, Pharm.D., BCPS []  Alycia Rossetti, Pharm.D., BCPS []  [place], [place].D., BCPS, AAHIVP []  Legrand Como, Pharm.D., BCPS, AAHIVP []  Salome Arnt, PharmD, BCPS [x]  Dimitri Ped, PharmD, BCPS []  Vincenza Hews, PharmD, BCPS  Positive urine culture Treated with Cephalexin, organism sensitive to the same and no further patient follow-up is required at this time.  Genia Del 07/09/2016, 10:16 AM

## 2016-07-24 ENCOUNTER — Emergency Department (HOSPITAL_COMMUNITY)
Admission: EM | Admit: 2016-07-24 | Discharge: 2016-07-24 | Disposition: A | Discharge status: Home / Self Care | Payer: Self-pay | Attending: Emergency Medicine | Admitting: Emergency Medicine

## 2016-07-24 ENCOUNTER — Encounter (HOSPITAL_COMMUNITY): Payer: Self-pay

## 2016-07-24 ENCOUNTER — Emergency Department (HOSPITAL_COMMUNITY): Payer: Self-pay

## 2016-07-24 DIAGNOSIS — W010XXA Fall on same level from slipping, tripping and stumbling without subsequent striking against object, initial encounter: Secondary | ICD-10-CM | POA: Insufficient documentation

## 2016-07-24 DIAGNOSIS — E1140 Type 2 diabetes mellitus with diabetic neuropathy, unspecified: Secondary | ICD-10-CM | POA: Insufficient documentation

## 2016-07-24 DIAGNOSIS — Z794 Long term (current) use of insulin: Secondary | ICD-10-CM | POA: Insufficient documentation

## 2016-07-24 DIAGNOSIS — I10 Essential (primary) hypertension: Secondary | ICD-10-CM | POA: Insufficient documentation

## 2016-07-24 DIAGNOSIS — M5440 Lumbago with sciatica, unspecified side: Secondary | ICD-10-CM | POA: Insufficient documentation

## 2016-07-24 DIAGNOSIS — Y939 Activity, unspecified: Secondary | ICD-10-CM | POA: Insufficient documentation

## 2016-07-24 DIAGNOSIS — Y999 Unspecified external cause status: Secondary | ICD-10-CM | POA: Insufficient documentation

## 2016-07-24 DIAGNOSIS — Y929 Unspecified place or not applicable: Secondary | ICD-10-CM | POA: Insufficient documentation

## 2016-07-24 LAB — POC URINE PREG, ED: Preg Test, Ur: NEGATIVE

## 2016-07-24 MED ORDER — DEXAMETHASONE 4 MG PO TABS
12.0000 mg | ORAL_TABLET | Freq: Once | ORAL | Status: AC
  Administered 2016-07-24: 12 mg via ORAL
  Filled 2016-07-24: qty 3

## 2016-07-24 MED ORDER — PROMETHAZINE HCL 25 MG/ML IJ SOLN
12.5000 mg | Freq: Once | INTRAMUSCULAR | Status: AC
  Administered 2016-07-24: 12.5 mg via INTRAMUSCULAR
  Filled 2016-07-24: qty 1

## 2016-07-24 MED ORDER — IBUPROFEN 400 MG PO TABS
600.0000 mg | ORAL_TABLET | Freq: Once | ORAL | Status: AC
  Administered 2016-07-24: 600 mg via ORAL
  Filled 2016-07-24: qty 1

## 2016-07-24 MED ORDER — ONDANSETRON 4 MG PO TBDP
4.0000 mg | ORAL_TABLET | Freq: Once | ORAL | Status: AC
  Administered 2016-07-24: 4 mg via ORAL
  Filled 2016-07-24: qty 1

## 2016-07-24 MED ORDER — HYDROCODONE-ACETAMINOPHEN 5-325 MG PO TABS: 1.0000 | ORAL_TABLET | Freq: Four times a day (QID) | ORAL | 0 refills | Status: DC | PRN

## 2016-07-24 MED ORDER — NAPROXEN 375 MG PO TABS: 375.0000 mg | ORAL_TABLET | Freq: Two times a day (BID) | ORAL | 0 refills | Status: DC

## 2016-07-24 MED ORDER — MORPHINE SULFATE (PF) 4 MG/ML IV SOLN
8.0000 mg | Freq: Once | INTRAVENOUS | Status: AC
  Administered 2016-07-24: 8 mg via INTRAMUSCULAR
  Filled 2016-07-24: qty 2

## 2016-07-24 NOTE — ED Notes (Signed)
Pt denies vomiting since administration of phenergan.

## 2016-07-24 NOTE — ED Triage Notes (Signed)
PT reports pain is unchanged  8/10 and nausea / vomiting  Unchanged .

## 2016-07-24 NOTE — ED Triage Notes (Signed)
Pt at [facility] yesterday, slipped on water, fell on buttocks.  Pain to lower back, radiating down bilateral legs, feels like burning and "pulses".  As pt fell she turned her right ankle and c/o right foot/ankle pain.
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
## 2016-08-11 ENCOUNTER — Ambulatory Visit (INDEPENDENT_AMBULATORY_CARE_PROVIDER_SITE_OTHER): Payer: Self-pay | Admitting: Student

## 2016-08-11 ENCOUNTER — Encounter: Payer: Self-pay | Admitting: Student

## 2016-08-11 VITALS — BP 124/72 | HR 83 | Temp 98.3°F | Wt 200.0 lb

## 2016-08-11 DIAGNOSIS — E118 Type 2 diabetes mellitus with unspecified complications: Secondary | ICD-10-CM

## 2016-08-11 DIAGNOSIS — R300 Dysuria: Secondary | ICD-10-CM

## 2016-08-11 DIAGNOSIS — E1165 Type 2 diabetes mellitus with hyperglycemia: Secondary | ICD-10-CM

## 2016-08-11 DIAGNOSIS — IMO0001 Reserved for inherently not codable concepts without codable children: Secondary | ICD-10-CM

## 2016-08-11 LAB — POCT UA - MICROSCOPIC ONLY

## 2016-08-11 LAB — POCT URINALYSIS DIP (MANUAL ENTRY)
Bilirubin, UA: NEGATIVE
Leukocytes, UA: NEGATIVE
NITRITE UA: NEGATIVE
Protein Ur, POC: NEGATIVE mg/dL
Spec Grav, UA: 1.015 (ref 1.010–1.025)
UROBILINOGEN UA: 0.2 U/dL
pH, UA: 7 (ref 5.0–8.0)

## 2016-08-11 LAB — POCT GLYCOSYLATED HEMOGLOBIN (HGB A1C): HEMOGLOBIN A1C: 11.4

## 2016-08-11 MED ORDER — METFORMIN HCL 500 MG PO TABS: 500.0000 mg | ORAL_TABLET | Freq: Two times a day (BID) | ORAL | 3 refills | Status: DC

## 2016-08-11 MED ORDER — BLOOD GLUCOSE MONITOR KIT: PACK | 0 refills | Status: DC

## 2016-08-11 MED ORDER — INSULIN GLARGINE 100 UNIT/ML SOLOSTAR PEN: 20.0000 [IU] | PEN_INJECTOR | Freq: Every day | SUBCUTANEOUS | 11 refills | Status: DC

## 2016-08-11 NOTE — Patient Instructions (Signed)
Follow up in one week for Diabetes check Take Lantus and metformin as prescribed Check your blood sugars daily Please make an appointment for ophthalmology

## 2016-08-11 NOTE — Addendum Note (Signed)
Addended by: [place] Pink on: 08/11/2016 12:45 PM   Modules accepted: Orders

## 2016-08-11 NOTE — Progress Notes (Signed)
   Subjective:    Patient ID: [name], female    DOB: 12/25/1975, 41 y.o.   MRN: [id number]   CC: concern for urinary tract infection  HPI: 41 y/o F with PMH of T2DM presents for concern for UTI  UTI - treated for UTI 1 month aho - still occasionally feels some dysuria - no fevers or abdominal pain  T2DM - last took Dm meds about 1 year ago - has not been seen for over one year - she does feels that she has some blurring of her vision occasionally which has been on going - she has not been seen by an eye doctor for a long time, she is unsure when she last saw one  Smoking status reviewed  Review of Systems  Per HPI, else denies chest pain, shortness of breath,     Objective:  BP 124/72   Pulse 83   Temp 98.3 F (36.8 C) (Oral)   Wt 200 lb (90.7 kg)   SpO2 98%   BMI 39.06 kg/m  Vitals and nursing note reviewed  General: NAD Cardiac: RRR, Respiratory: CTAB, normal effort Extremities: no edema or cyanosis. WWP., see quality metrics for foot exam Skin: warm and dry, no rashes noted Neuro: alert and oriented, no focal deficits   Assessment & Plan:    Dysuria UA negative, will follow as needed  Diabetes mellitus, type 2 Lantus, metformin refilled. A1c, BMP, lipid panel today. Testing supplies also reordered as she is out - follow up in one week with recorded blood sugars - adjust meds as needed - ophtho referral placed today and she was told she needs one prior to making an appointment    Wai Minotti A. Lincoln Brigham MD, [facility] Resident PGY-3 Pager [phone]

## 2016-08-11 NOTE — Assessment & Plan Note (Signed)
UA negative, will follow as needed

## 2016-08-11 NOTE — Assessment & Plan Note (Signed)
Lantus, metformin refilled. A1c, BMP, lipid panel today. Testing supplies also reordered as she is out - follow up in one week with recorded blood sugars - adjust meds as needed - ophtho referral placed today and she was told she needs one prior to making an appointment

## 2016-08-12 ENCOUNTER — Encounter: Payer: Self-pay | Admitting: Student

## 2016-08-12 LAB — BASIC METABOLIC PANEL
BUN/Creatinine Ratio: 25 — ABNORMAL HIGH (ref 9–23)
BUN: 12 mg/dL (ref 6–24)
CALCIUM: 8.8 mg/dL (ref 8.7–10.2)
CHLORIDE: 103 mmol/L (ref 96–106)
CO2: 22 mmol/L (ref 18–29)
Creatinine, Ser: 0.48 mg/dL — ABNORMAL LOW (ref 0.57–1.00)
GFR calc Af Amer: 141 mL/min/{1.73_m2} (ref >59)
GFR, EST NON AFRICAN AMERICAN: 122 mL/min/{1.73_m2} (ref >59)
GLUCOSE: 398 mg/dL — AB (ref 65–99)
POTASSIUM: 4.5 mmol/L (ref 3.5–5.2)
SODIUM: 140 mmol/L (ref 134–144)

## 2016-08-12 LAB — LIPID PANEL
CHOL/HDL RATIO: 4.7 ratio — AB (ref 0.0–4.4)
CHOLESTEROL TOTAL: 166 mg/dL (ref 100–199)
HDL: 35 mg/dL — AB (ref >39)
TRIGLYCERIDES: 441 mg/dL — AB (ref 0–149)

## 2016-08-22 ENCOUNTER — Ambulatory Visit: Payer: Self-pay | Admitting: Student

## 2016-09-24 ENCOUNTER — Other Ambulatory Visit: Payer: Self-pay | Admitting: Critical Care Medicine

## 2016-09-24 DIAGNOSIS — IMO0001 Reserved for inherently not codable concepts without codable children: Secondary | ICD-10-CM

## 2016-09-24 DIAGNOSIS — J0100 Acute maxillary sinusitis, unspecified: Secondary | ICD-10-CM

## 2016-09-24 DIAGNOSIS — E1141 Type 2 diabetes mellitus with diabetic mononeuropathy: Secondary | ICD-10-CM

## 2016-09-24 DIAGNOSIS — K921 Melena: Secondary | ICD-10-CM

## 2016-09-24 DIAGNOSIS — Z794 Long term (current) use of insulin: Secondary | ICD-10-CM

## 2016-09-24 DIAGNOSIS — E1165 Type 2 diabetes mellitus with hyperglycemia: Principal | ICD-10-CM

## 2016-09-24 DIAGNOSIS — J019 Acute sinusitis, unspecified: Secondary | ICD-10-CM | POA: Insufficient documentation

## 2016-09-24 LAB — POCT GLYCOSYLATED HEMOGLOBIN (HGB A1C): Hemoglobin A1C: 13.9

## 2016-09-24 LAB — GLUCOSE, POCT (MANUAL RESULT ENTRY): POC GLUCOSE: 310 mg/dL — AB (ref 70–99)

## 2016-09-24 MED ORDER — METFORMIN HCL 500 MG PO TABS: 1000.0000 mg | ORAL_TABLET | Freq: Two times a day (BID) | ORAL | 3 refills | Status: DC

## 2016-09-24 MED ORDER — FLUTICASONE PROPIONATE 50 MCG/ACT NA SUSP: 2.0000 | Freq: Every day | NASAL | 6 refills | Status: DC

## 2016-09-24 MED ORDER — AZITHROMYCIN 250 MG PO TABS: ORAL_TABLET | ORAL | 0 refills | Status: DC

## 2016-09-24 NOTE — Progress Notes (Unsigned)
Pt ill , [facility].    Pt with cough, wheezes, runny nose, chest congestion.  No fever.  Pt with DM 2 and A1C now 13 here at fair. Prior A1C 11.      Pt on metformin 500bid and lantus 20units qhs.  Pt does not eat breakfast.   Does eat carbs for dinner.  PE: 124/74  p 74  T 96.5 rr 10 Gen: Pleasant, well-nourished, in no distress,  normal affect  ENT: No lesions,  mouth clear,  Oropharynx mild erythemsa,  Nasal purulence, +++ postnasal drip  Neck: No JVD, no TMG, no carotid bruits  Lungs: No use of accessory muscles, no dullness to percussion, clear without rales or rhonchi  Cardiovascular: RRR, heart sounds normal, no murmur or gallops, no peripheral edema  Abdomen: soft and NT, no HSM,  BS normal  Musculoskeletal: No deformities, no cyanosis or clubbing  Neuro: alert, non focal  Skin: Warm, no lesions or rashes  No results found.   I personally reviewed all images and lab data in the [facility] system as well as any outside material available during this office visit and agree with the  radiology impressions.   Sinusitis, acute Acute frontal sinusitis and rhinitis  Plan  azithromycin 250mg  Take two once then one daily until gone flonase two puff ea nostril daily   Blood in stool Ongoing blood in stool Last CBC H and H normal 4/18 Needs GI f/u  Diabetes mellitus, type 2 DM 2 poor control    Gave 5 units regular insulin sq now  Cont lantus 20units daily Increase metformin 1000mg  bid Diet education provided Needs PCP f/u   Diagnoses and all orders for this visit:  Uncontrolled type 2 diabetes mellitus without complication, without long-term current use of insulin (HCC) -     metFORMIN (GLUCOPHAGE) 500 MG tablet; Take 2 tablets (1,000 mg total) by mouth 2 (two) times daily with a meal.  Acute non-recurrent maxillary sinusitis  Blood in stool  Type 2 diabetes mellitus with diabetic mononeuropathy, with long-term current use of insulin  (HCC)  Other orders -     azithromycin (ZITHROMAX) 250 MG tablet; Take two once then one daily until gone -     fluticasone (FLONASE) 50 MCG/ACT nasal spray; Place 2 sprays into both nostrils daily.    Marland Kitchen

## 2016-09-24 NOTE — Assessment & Plan Note (Signed)
Ongoing blood in stool Last CBC H and H normal 4/18 Needs GI f/u

## 2016-09-24 NOTE — Assessment & Plan Note (Signed)
Acute frontal sinusitis and rhinitis  Plan  azithromycin 250mg  Take two once then one daily until gone flonase two puff ea nostril daily

## 2016-09-24 NOTE — Assessment & Plan Note (Signed)
DM 2 poor control    Gave 5 units regular insulin sq now  Cont lantus 20units daily Increase metformin 1000mg  bid Diet education provided Needs PCP f/u

## 2016-10-10 ENCOUNTER — Other Ambulatory Visit (HOSPITAL_COMMUNITY): Payer: Self-pay | Admitting: Chiropractic Medicine

## 2016-10-10 DIAGNOSIS — M545 Low back pain: Secondary | ICD-10-CM

## 2016-10-17 ENCOUNTER — Ambulatory Visit (HOSPITAL_COMMUNITY)
Admission: RE | Admit: 2016-10-17 | Discharge: 2016-10-17 | Disposition: A | Discharge status: Home / Self Care | Payer: Self-pay | Source: Ambulatory Visit | Attending: Chiropractic Medicine | Admitting: Chiropractic Medicine

## 2016-10-17 DIAGNOSIS — M545 Low back pain: Secondary | ICD-10-CM

## 2016-10-17 DIAGNOSIS — M62830 Muscle spasm of back: Secondary | ICD-10-CM | POA: Insufficient documentation

## 2016-10-17 DIAGNOSIS — M5127 Other intervertebral disc displacement, lumbosacral region: Secondary | ICD-10-CM | POA: Insufficient documentation

## 2016-10-18 ENCOUNTER — Encounter: Payer: Self-pay | Admitting: Internal Medicine

## 2016-10-18 ENCOUNTER — Ambulatory Visit (INDEPENDENT_AMBULATORY_CARE_PROVIDER_SITE_OTHER): Payer: Self-pay | Admitting: Internal Medicine

## 2016-10-18 VITALS — BP 112/78 | HR 79 | Temp 98.2°F | Wt 199.0 lb

## 2016-10-18 DIAGNOSIS — E1141 Type 2 diabetes mellitus with diabetic mononeuropathy: Secondary | ICD-10-CM

## 2016-10-18 DIAGNOSIS — E1165 Type 2 diabetes mellitus with hyperglycemia: Secondary | ICD-10-CM

## 2016-10-18 DIAGNOSIS — R300 Dysuria: Secondary | ICD-10-CM

## 2016-10-18 DIAGNOSIS — Z794 Long term (current) use of insulin: Secondary | ICD-10-CM

## 2016-10-18 LAB — POCT URINALYSIS DIP (MANUAL ENTRY)
BILIRUBIN UA: NEGATIVE
Blood, UA: NEGATIVE
Ketones, POC UA: NEGATIVE mg/dL
Leukocytes, UA: NEGATIVE
Nitrite, UA: NEGATIVE
Protein Ur, POC: NEGATIVE mg/dL
SPEC GRAV UA: 1.01 (ref 1.010–1.025)
Urobilinogen, UA: 0.2 E.U./dL
pH, UA: 6 (ref 5.0–8.0)

## 2016-10-18 MED ORDER — INSULIN GLARGINE 100 UNIT/ML SOLOSTAR PEN: 25.0000 [IU] | PEN_INJECTOR | Freq: Every day | SUBCUTANEOUS | 11 refills | Status: DC

## 2016-10-18 MED ORDER — CEPHALEXIN 500 MG PO CAPS: 500.0000 mg | ORAL_CAPSULE | Freq: Two times a day (BID) | ORAL | 0 refills | Status: DC

## 2016-10-18 NOTE — Patient Instructions (Addendum)
Sra. [name],  Por favor, tome el antibitico Keflex 500 mg [place]. Sus sntomas son preocupantes para una infeccin del tracto urinario. Su orina, sin embargo, no Games developer o infeccin. Enviar orina para cultivo para ver si crece bacterias.  Por favor, aumente lantus a 25 U cada noche. Por favor, revise sus azcares dos veces al da y haga un seguimiento con nosotros en aproximadamente 1 semana para ver cmo estn funcionando sus azcares.  Beba muchos lquidos para mantenerse hidratado a pesar de su disminucin del apetito.  Mejor, Dr. Ola Spurr   [name],  Please take the antibiotic keflex 500 mg twice daily. Your symptoms are concerning for a urinary tract infection. Your urine, however, did not show blood or infection. I will send the urine for culture to see if it grows bacteria.  Please increase lantus to 25 U nightly. Please check your sugars twice daily and follow-up with [place] in about 1 week to see how your sugars are running.  Drink plenty of fluids to stay hydrated despite your decreased appetite.   Best, Dr. Ola Spurr

## 2016-10-20 LAB — URINE CULTURE

## 2016-10-20 NOTE — Progress Notes (Signed)
[name] Progress Note  Subjective:  [name] is a 41 y.o. female with poorly controlled T2DM, HTN, hx of tubal ligation, and obesity who presents for concern for UTI. Visit assisted by Spanish video interpreter Nela [phone]  #Dysuria: - Reports pain with urination since this weekend - Feels like her bladder/lower abdomen hurts after she urinates. Also having increased urinary frequency. - Says her back hurts but that this is not new.  - Feels hot and cold but does not know if she's had true fever. - Thinks she had some blood in her urine yesterday. - Also had some diarrhea last week. ROS: No groin pain, no vaginal discharge  #T2DM: - Reports blood sugar this morning was 230. Says she has been taking 20 U nightly. Blood sugars often in 200s when patient wakes up. - A1c recorded as 13.9 from [facility] [address] [facility] [address] 8/54/62 - Also taking metformin 1000 mg BID  No Known Allergies  Objective: Blood pressure 112/78, pulse 79, temperature 98.2 F (36.8 C), temperature source Oral, weight 199 lb (90.3 kg). Body mass index is 38.86 kg/m. Constitutional: obese female in NAD HENT: MMM Cardiovascular: RRR, S1, S2, no m/r/g.  Pulmonary/Chest: Effort normal and breath sounds normal. No respiratory distress.  Abdominal: Soft. +BS, endorses mild abdominal TTP especially over epigastric region, ND, no rebound or guarding.  Musculoskeletal: TTP across lumbar paraspinal muscles but no true CVA tenderness Neurological: AOx3, Psychiatric: Anxious affect Vitals reviewed  Last had culture positive E. coli UTI 07/05/16.   Assessment/Plan: Dysuria - UA obtained. No nitrites, leukocytes or blood. However, given classical symptoms of UTI will treat empirically with keflex and obtain urine culture. Patient could also be having pain and frequency from poorly controlled blood sugars (>1000 glucose in urine but no ketones), possibly leading to neurogenic bladder though  acute onset of discomfort makes this less likely.  - Will follow-up urine culture  Diabetes mellitus, type 2 - Poorly controlled. Recommended increasing lantus from 20 U nightly to 25 U nightly. Aim is for fasting blood sugars to be in low 110s.  - Recommended patient follow-up next week to discuss how to improve control.  Follow-up in about 1 week to reassess blood sugars.  Olene Floss, MD [place], PGY-3

## 2016-10-21 DIAGNOSIS — Z794 Long term (current) use of insulin: Secondary | ICD-10-CM

## 2016-10-21 DIAGNOSIS — E1165 Type 2 diabetes mellitus with hyperglycemia: Secondary | ICD-10-CM | POA: Insufficient documentation

## 2016-10-21 NOTE — Assessment & Plan Note (Signed)
-   UA obtained. No nitrites, leukocytes or blood. However, given classical symptoms of UTI will treat empirically with keflex and obtain urine culture. Patient could also be having pain and frequency from poorly controlled blood sugars (>1000 glucose in urine but no ketones), possibly leading to neurogenic bladder though acute onset of discomfort makes this less likely.  - Will follow-up urine culture

## 2016-10-21 NOTE — Assessment & Plan Note (Signed)
-   Poorly controlled. Recommended increasing lantus from 20 U nightly to 25 U nightly. Aim is for fasting blood sugars to be in low 110s.  - Recommended patient follow-up next week to discuss how to improve control.

## 2016-10-26 ENCOUNTER — Ambulatory Visit (INDEPENDENT_AMBULATORY_CARE_PROVIDER_SITE_OTHER): Payer: Self-pay | Admitting: Family Medicine

## 2016-10-26 ENCOUNTER — Encounter: Payer: Self-pay | Admitting: Family Medicine

## 2016-10-26 VITALS — BP 110/70 | HR 74 | Temp 97.7°F | Ht 60.0 in | Wt 200.0 lb

## 2016-10-26 DIAGNOSIS — R300 Dysuria: Secondary | ICD-10-CM

## 2016-10-26 DIAGNOSIS — K921 Melena: Secondary | ICD-10-CM

## 2016-10-26 DIAGNOSIS — Z794 Long term (current) use of insulin: Secondary | ICD-10-CM

## 2016-10-26 DIAGNOSIS — E1165 Type 2 diabetes mellitus with hyperglycemia: Secondary | ICD-10-CM

## 2016-10-26 DIAGNOSIS — R1030 Lower abdominal pain, unspecified: Secondary | ICD-10-CM

## 2016-10-26 DIAGNOSIS — B351 Tinea unguium: Secondary | ICD-10-CM

## 2016-10-26 LAB — POCT URINALYSIS DIP (MANUAL ENTRY)
Bilirubin, UA: NEGATIVE
GLUCOSE UA: NEGATIVE mg/dL
Ketones, POC UA: NEGATIVE mg/dL
Leukocytes, UA: NEGATIVE
NITRITE UA: NEGATIVE
PH UA: 5.5 (ref 5.0–8.0)
Protein Ur, POC: 30 mg/dL — AB
RBC UA: NEGATIVE
Spec Grav, UA: >=1.03 — AB (ref 1.010–1.025)
Urobilinogen, UA: 0.2 E.U./dL

## 2016-10-26 MED ORDER — ASPIRIN EC 81 MG PO TBEC: 81.0000 mg | DELAYED_RELEASE_TABLET | Freq: Every day | ORAL | 3 refills | Status: DC

## 2016-10-26 MED ORDER — TERBINAFINE HCL 250 MG PO TABS: 250.0000 mg | ORAL_TABLET | Freq: Every day | ORAL | 0 refills | Status: DC

## 2016-10-26 MED ORDER — ATORVASTATIN CALCIUM 10 MG PO TABS: 10.0000 mg | ORAL_TABLET | Freq: Every day | ORAL | 3 refills | Status: DC

## 2016-10-26 NOTE — Assessment & Plan Note (Signed)
-   UA obtained. No nitrites, leukocytes or blood. Patient came in to the office with classical symptoms of UTI last week and was treated empirically with keflex. Her last dose was today. However, her symptoms are not improving, despite culture showing susceptibility to keflex.  - Patient could also be having pain and frequency from poorly controlled blood sugars (previously >1000 glucose in urine with no ketones), possibly leading to neurogenic bladder though acute onset of discomfort makes this less likely.  - Will follow-up urine culture

## 2016-10-26 NOTE — Assessment & Plan Note (Addendum)
Patient complaining of bright red blood in toilet after a BM. No associated pain or discomfort. Has to strain at times. Reassured and told we would address this at her next appointment in 6 weeks when she comes for LFT's and diabetes management. Patient encouraged to eat more fiber.

## 2016-10-26 NOTE — Assessment & Plan Note (Signed)
Patient has onychomycosis of both great toes on foot exam. Prescribed terbinafine today. Will follow up with LFT's in 6 weeks while patient on this medication.

## 2016-10-26 NOTE — Assessment & Plan Note (Addendum)
-   Poorly controlled. Recommended increasing lantus from 25 U nightly by increments of 1 unit every night her sugar is above 150. Patient is to not go above 40 units. Patient expressed understanding of this plan and repeated back to me what she is to do.  - Recommended patient follow-up in no more than 6 weeks to discuss how this is working for her. - Patient started on a once a day baby aspirin, and atorvastatin. - Patient encouraged to exercise daily and try and adjust her diet.

## 2016-10-26 NOTE — Progress Notes (Signed)
Subjective:    [name] - 42 y.o. female MRN [id number]  Date of birth: Jun 20, 1975  HPI Patient seen with stratus interpreter Ebony Hail: [phone]  Teosha Casso is here for Diabetes Management follow up. #T2DM: - Patient brought in glucometer for me to review sugars: 232, 335, 179, 227, 214, 216, 327 were her recordings. - A1c recorded as 13.[facility] [address] [facility] [address] 5/97/41. Not taken today. - Also taking metformin 1000 mg BID.  #Dysuria: - Reports pain with urination since last weekend - Feels like her bladder/lower abdomen hurts after she urinates. Also having increased urinary frequency. - Says her back hurts but that this is not new.  - Feels hot and cold but does not think she's had true fever. ROS: No groin pain, no vaginal discharge  #Blood in toilet after BM: - Reports bright red blood in toilet after a BM.  - Has reported straining when going to bathroom. - Denies pain  Health Maintenance:  - Ophthalmology exam trying to be scheduled.  Health Maintenance Due  Topic Date Due  . PNEUMOCOCCAL POLYSACCHARIDE VACCINE (2) 09/18/2013  . OPHTHALMOLOGY EXAM  05/15/2015  . PAP SMEAR  08/20/2016  . INFLUENZA VACCINE  10/12/2016    -  reports that she has never smoked. She has never used smokeless tobacco. - Review of Systems: Per HPI. - Past Medical History: Patient Active Problem List   Diagnosis Date Noted  . Onychomycosis 10/26/2016  . Uncontrolled type 2 diabetes mellitus with hyperglycemia, with long-term current use of insulin ([place]) 10/21/2016  . Healthcare maintenance 06/16/2015  . Abnormal uterine bleeding 10/30/2014  . Diabetic neuropathy ([place]) 04/25/2014  . Pelvic pain in female 03/27/2014  . Yeast infection 03/27/2014  . GI bleed 03/19/2014  . Abdominal pain, epigastric 03/19/2014  . Blood in stool 11/08/2013  . Numbness of right side of body 10/08/2013  . Anemia 12/21/2012  . Bilateral calf pain 08/21/2012  . Numbness of face  08/21/2012  . Seasonal allergies 07/26/2012  . Dysuria 07/26/2012  . Back pain 04/20/2012  . Hepatic steatosis 10/01/2011  . Depression 05/26/2011  . Hypertension 05/26/2011  . GERD 09/17/2008  . Morbid obesity ([place]) 07/23/2008   - Medications: reviewed and updated Current Outpatient Prescriptions  Medication Sig Dispense Refill  . aspirin EC 81 MG tablet Take 1 tablet (81 mg total) by mouth daily. 120 tablet 3  . atorvastatin (LIPITOR) 10 MG tablet Take 1 tablet (10 mg total) by mouth daily. 120 tablet 3  . blood glucose meter kit and supplies KIT Dispense based on patient and insurance preference. Use up to four times daily as directed. (FOR ICD-9 250.00, 250.01). (Patient not taking: Reported on 08/11/2016) 1 each 0  . cephALEXin (KEFLEX) 500 MG capsule Take 1 capsule (500 mg total) by mouth 2 (two) times daily. 14 capsule 0  . fluticasone (FLONASE) 50 MCG/ACT nasal spray Place 2 sprays into both nostrils daily. 16 g 6  . HYDROcodone-acetaminophen (NORCO/VICODIN) 5-325 MG tablet Take 1-2 tablets by mouth every 6 (six) hours as needed. (Patient not taking: Reported on 08/11/2016) 10 tablet 0  . ibuprofen (ADVIL,MOTRIN) 600 MG tablet Take 1 tablet (600 mg total) by mouth every 6 (six) hours as needed. (Patient not taking: Reported on 08/11/2016) 30 tablet 0  . Insulin Glargine (LANTUS) 100 UNIT/ML Solostar Pen Inject 25 Units into the skin daily at 10 pm. 15 mL 11  . metFORMIN (GLUCOPHAGE) 500 MG tablet Take 2 tablets (1,000 mg total) by mouth 2 (two)  times daily with a meal. 180 tablet 3  . naproxen (NAPROSYN) 375 MG tablet Take 1 tablet (375 mg total) by mouth 2 (two) times daily. (Patient not taking: Reported on 08/11/2016) 20 tablet 0  . oxyCODONE-acetaminophen (PERCOCET/ROXICET) 5-325 MG tablet Take 1 tablet by mouth every 6 (six) hours as needed for severe pain. (Patient not taking: Reported on 08/11/2016) 10 tablet 0  . promethazine (PHENERGAN) 25 MG tablet Take 1 tablet (25 mg total) by  mouth every 6 (six) hours as needed for nausea or vomiting. (Patient not taking: Reported on 08/11/2016) 12 tablet 0  . terbinafine (LAMISIL) 250 MG tablet Take 1 tablet (250 mg total) by mouth daily. 90 tablet 0   No current facility-administered medications for this visit.      Review of Systems See HPI     Objective:   Physical Exam BP 110/70   Pulse 74   Temp 97.7 F (36.5 C) (Oral)   Ht 5' (1.524 m)   Wt 200 lb (90.7 kg)   SpO2 98%   BMI 39.06 kg/m  Gen: NAD, alert, cooperative with exam, well-appearing HEENT: NCAT, PERRL, clear conjunctiva, oropharynx clear, supple neck CV: RRR, good S1/S2, no murmur, no edema, capillary refill brisk  Resp: CTABL, no wheezes, non-labored Abd: SNTND, BS present, no guarding or organomegaly Skin: no rashes, normal turgor  Neuro: no gross deficits.  Psych: good insight, alert and oriented        Assessment & Plan:   Uncontrolled type 2 diabetes mellitus with hyperglycemia, with long-term current use of insulin (HCC) - Poorly controlled. Recommended increasing lantus from 25 U nightly by increments of 1 unit every night her sugar is above 150. Patient is to not go above 40 units. Patient expressed understanding of this plan and repeated back to me what she is to do.  - Recommended patient follow-up in no more than 6 weeks to discuss how this is working for her. - Patient started on a once a day baby aspirin, and atorvastatin. - Patient encouraged to exercise daily and try and adjust her diet.   Dysuria - UA obtained. No nitrites, leukocytes or blood. Patient came in to the office with classical symptoms of UTI last week and was treated empirically with keflex. Her last dose was today. However, her symptoms are not improving, despite culture showing susceptibility to keflex.  - Patient could also be having pain and frequency from poorly controlled blood sugars (previously >1000 glucose in urine with no ketones), possibly leading to  neurogenic bladder though acute onset of discomfort makes this less likely.  - Will follow-up urine culture   Onychomycosis Patient has onychomycosis of both great toes on foot exam. Prescribed terbinafine today. Will follow up with LFT's in 6 weeks while patient on this medication.   Blood in stool Patient complaining of bright red blood in toilet after a BM. No associated pain or discomfort. Has to strain at times. Reassured and told we would address this at her next appointment in 6 weeks when she comes for LFT's and diabetes management. Patient encouraged to eat more fiber.

## 2016-10-26 NOTE — Patient Instructions (Addendum)
It was a pleasure to see you today! Thank you for choosing [facility] for your primary care. [name] was seen for Diabetes management.   Our plans for today were:  Increase your Lantus 25 U by 1 unit each night if your sugars are above 150. Increase each night until your sugars are at or below 150. The highest dose will be 40 units. Do not go above 40 units. Please follow up with your provider in 6 weeks.   We are collecting another urine culture and I should have results by Friday and will decide what treatment to start.  I have written a prescription for your toe fungal infection. Take one pill daily of terbinafine.  To keep you healthy, we need to monitor some screening tests. You are due for an eye appointment.   You should return to our clinic to see your provider in 6 weeks for follow up on your sugars.   Best,  Dr. Johnella Moloney diabetes mellitus y los alimentos (Diabetes Mellitus and Food) Es importante que controle su nivel de azcar en la sangre (glucosa). El nivel de glucosa en sangre depende en gran medida de lo que usted come. [place] alimentos saludables en las cantidades [place] a lo largo del Training and development officer, aproximadamente a la misma hora [employer], lo ayudar a Chief Technology Officer su nivel de Multimedia programmer. [place] puede ayudarlo a retrasar o Patent attorney de la diabetes mellitus. [place] de [employer] saludable incluso puede ayudarlo a Chartered loss adjuster de presin arterial y a Science writer o Theatre manager un peso saludable. Entre las recomendaciones generales para alimentarse y Audiological scientist los alimentos de forma saludable, se incluyen las siguientes:  Respetar las comidas principales y comer colaciones con regularidad. Evitar pasar largos perodos sin comer con el fin de perder peso.  Seguir una dieta que consista principalmente en alimentos de origen vegetal, como frutas, vegetales, frutos secos, legumbres y cereales integrales.  Utilizar mtodos de coccin a baja  temperatura, como hornear, en lugar de mtodos de coccin a alta temperatura, como frer en abundante aceite. Trabaje con el nutricionista para aprender a Financial planner nutricional de las etiquetas de los alimentos. CMO PUEDEN AFECTARME LOS ALIMENTOS? Carbohidratos Los carbohidratos afectan el nivel de glucosa en sangre ms que cualquier otro tipo de alimento. El nutricionista lo ayudar a Teacher, adult education cuntos carbohidratos puede consumir en cada comida y ensearle a contarlos. El recuento de carbohidratos es importante para mantener la glucosa en sangre en un nivel saludable, en especial si utiliza insulina o toma determinados medicamentos para la diabetes mellitus. Alcohol El alcohol puede provocar disminuciones sbitas de la glucosa en sangre (hipoglucemia), en especial si utiliza insulina o toma determinados medicamentos para la diabetes mellitus. La hipoglucemia es una afeccin que puede poner en peligro la vida. Los sntomas de la hipoglucemia (somnolencia, mareos y Data processing manager) son similares a los sntomas de haber consumido mucho alcohol. Si el mdico lo autoriza a beber alcohol, hgalo con moderacin y siga estas pautas:  Las mujeres no deben beber ms de un trago por da, y los hombres no deben beber ms de dos tragos por Training and development officer. Un trago es igual a: ? 12 onzas (355 ml) de cerveza ? 5 onzas de vino (150 ml) de vino ? 1,5onzas (45ml) de bebidas espirituosas  No beba con el estmago vaco.  Mantngase hidratado. Beba agua, gaseosas dietticas o t helado sin azcar.  Las gaseosas comunes, los jugos y otros refrescos podran contener muchos carbohidratos y se deben  contar. QU ALIMENTOS NO SE RECOMIENDAN? Cuando haga las elecciones de alimentos, es importante que recuerde que todos los alimentos son distintos. Algunos tienen menos nutrientes que otros por porcin, aunque podran tener la misma cantidad de caloras o carbohidratos. Es difcil darle al cuerpo lo que necesita cuando  consume alimentos con menos nutrientes. Estos son algunos ejemplos de alimentos que debera evitar ya que contienen muchas caloras y carbohidratos, pero pocos nutrientes:  Physicist, medical trans (la mayora de los alimentos procesados incluyen grasas trans en la etiqueta de Informacin nutricional).  Gaseosas comunes.  Jugos.  Caramelos.  Dulces, como tortas, pasteles, rosquillas y [place].  Comidas fritas. QU ALIMENTOS PUEDO COMER? Consuma alimentos ricos en nutrientes, que nutrirn el cuerpo y lo mantendrn saludable. Los alimentos que debe comer tambin dependern de varios factores, como:  Las caloras que necesita.  Los medicamentos que toma.  Su peso.  El nivel de glucosa en [place].  El [place] de presin arterial.  El nivel de colesterol. Debe consumir una amplia variedad de alimentos, por ejemplo:  Protenas. ? Cortes de [employer]. ? Protenas con bajo contenido de grasas saturadas, como pescado, clara de huevo y frijoles. Evite las carnes procesadas.  Frutas y vegetales. ? [place] y Photographer que pueden ayudar a [employer] niveles sanguneos de [place], como [place], mangos y batatas.  Productos lcteos. ? Elija productos lcteos sin grasa o con bajo contenido de [place], como [place], yogur y [place].  Cereales, panes, pastas y arroz. ? Elija cereales integrales, como panes multicereales, avena en grano y arroz integral. Estos alimentos pueden ayudar a controlar la presin arterial.  Daphene Jaeger. ? Alimentos que contengan grasas saludables, como frutos secos, Musician, aceite de [place], aceite de canola y pescado. TODOS LOS QUE PADECEN DIABETES MELLITUS TIENEN EL [place] PLAN DE [place]? Dado que todas las personas que padecen diabetes mellitus son distintas, no hay un solo plan de comidas que funcione para todos. Es muy importante que se rena con un nutricionista que lo ayudar a crear un plan de comidas adecuado para usted. Esta informacin no tiene Marine scientist el  consejo del mdico. Asegrese de hacerle al mdico cualquier pregunta que tenga. Document Released: 06/07/2007 Document Revised: 03/21/2014 Document Reviewed: 01/25/2013 Elsevier Interactive Patient Education  2017 [employer].

## 2016-10-28 LAB — URINE CULTURE

## 2016-12-14 ENCOUNTER — Encounter: Payer: Self-pay | Admitting: Family Medicine

## 2016-12-14 ENCOUNTER — Ambulatory Visit (INDEPENDENT_AMBULATORY_CARE_PROVIDER_SITE_OTHER): Payer: Self-pay | Admitting: Family Medicine

## 2016-12-14 VITALS — BP 110/80 | HR 84 | Temp 98.6°F | Ht 60.0 in | Wt 215.0 lb

## 2016-12-14 DIAGNOSIS — K921 Melena: Secondary | ICD-10-CM

## 2016-12-14 DIAGNOSIS — K0889 Other specified disorders of teeth and supporting structures: Secondary | ICD-10-CM

## 2016-12-14 DIAGNOSIS — E1165 Type 2 diabetes mellitus with hyperglycemia: Secondary | ICD-10-CM

## 2016-12-14 DIAGNOSIS — G8929 Other chronic pain: Secondary | ICD-10-CM

## 2016-12-14 DIAGNOSIS — M545 Low back pain: Secondary | ICD-10-CM

## 2016-12-14 DIAGNOSIS — Z794 Long term (current) use of insulin: Secondary | ICD-10-CM

## 2016-12-14 LAB — POCT GLYCOSYLATED HEMOGLOBIN (HGB A1C): Hemoglobin A1C: 8.2

## 2016-12-14 MED ORDER — METFORMIN HCL ER 500 MG PO TB24: 1000.0000 mg | ORAL_TABLET | Freq: Two times a day (BID) | ORAL | 4 refills | Status: DC

## 2016-12-14 NOTE — Patient Instructions (Signed)
It was a pleasure to see you today! Thank you for choosing [facility] for your primary care, [name].   Our plans for today were:  Please continue taking 25 U of Lantus and working on your diet. If you sugar gets to 70, please drink some juice. You goal is 100-130. YOU ARE DOING GREAT!  Take miralax for constipation.  Changing metformin to a different pill, same dose. 1000 mg twice a day to reduce diarrhea.  We will be working on an eye appointment for you.  You will need to make an appointment with a dentist.  We stopped the terbinafine.  You should return to our clinic to see me in 6 months for a regular check up or sooner if needed.   Best,  Dr. Enid Derry   Hemorroides (Hemorrhoids) Las hemorroides son venas inflamadas adentro o alrededor del recto o del ano. Las hemorroides pueden causar dolor, picazn o hemorragias. Generalmente no causan problemas graves. Con frecuencia mejoran al [employer] dieta, el estilo de vida y otros tratamientos Financial planner. CUIDADOS EN EL HOGAR Comida y bebida Consuma alimentos que contengan fibra, como cereales integrales, porotos, frutos secos, frutas y verduras. Pregntele a su mdico acerca de tomar productos con fibra aadida en ellos (complementos defibra). Beba suficiente lquido para mantener el pis (orina) claro o de color amarillo plido. En caso de dolor e Norris un bao de agua tibia (bao de asiento) durante 20 minutos para Best boy. Hgalo 3 o 4veces al da. Si se lo indican, aplique hielo sobre la zona adolorida. Puede ser beneficioso aplicar hielo [employer] baos con agua tibia. Ponga el hielo en una bolsa plstica. Coloque una toalla entre la piel y la bolsa de hielo. Coloque el hielo durante 20 minutos, 2 a 3 veces por da. Instrucciones generales [employer] de venta libre y los recetados solamente como se lo haya indicado el mdico. Las cremas y medicamentos recetados que se  colocan en el ano (supositorios) se pueden usar o Science writer se lo hayan indicado. Haga ejercicio fsico con frecuencia. Vaya al bao cuando sienta la necesidad de defecar. No espere. Evite hacer demasiada fuerza al defecar. Mantenga la zona del ano limpia y [place]. Use papel higinico hmedo o toallas de papel hmedas. No pase mucho tiempo sentado en el inodoro. SOLICITE AYUDA SI: Tiene alguno de estos sntomas: Dolor e hinchazn que no mejoran con el tratamiento o los medicamentos. Hemorragia que no se detiene. Dificultad para defecar o imposibilidad de hacerlo. Dolor o hinchazn en la zona exterior de las hemorroides. Esta informacin no tiene Marine scientist el consejo del mdico. Asegrese de hacerle al mdico cualquier pregunta que tenga. Document Released: 06/25/2012 Document Revised: 06/22/2015 Document Reviewed: 11/12/2014 Elsevier Interactive Patient Education  [employer].
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
## 2016-12-15 NOTE — Assessment & Plan Note (Signed)
Likely MSK in nature. Seems to be chronic at this point. Advil and tylenol dull it. MRI findings in August are reassuring. She says the advil and tylenol help when alternating them. Shown stretches to help her with the pain. Also encouraged to apply heat and cold if it gets worse. Patient reports understanding. Encouraged weight loss, but she was already motivated.

## 2016-12-15 NOTE — Assessment & Plan Note (Signed)
Pain on right side of face, appears to be attributed to dental problem, as ears were clear. Will refer to dentist as patient has orange card.

## 2016-12-15 NOTE — Assessment & Plan Note (Signed)
Patient given handout on hemorrhoids and instructed to take miralax daily. She was given parameters of when to return regarding seeing large amount of stool. CBC in April was normal, and she has no other symptoms of anemia. Will continue to monitor.  - Looking back into her history she had a colonoscopy done in 2016 which showed internal hemorrhoids, but a repeat colonoscopy was requested one month after the first. This appears to have not occurred. Will get another colonoscopy if symptoms persist after change in routine with sitz baths and high fiber diet with miralax.  -No family history of colon history.

## 2016-12-15 NOTE — Assessment & Plan Note (Signed)
Patient doing well with 25 U lantus nightly. She did not require a tapered dose as suggested previously, but her sugars are much approved at 120-200 being what she remembers as most of her readings. We will continue with this dosage. A1c today was 8.1 which is greatly improved. Signs and symptoms of hypoglycemia discussed and patient understands and plans to drink juice if she gets a reading of 70. She was very happy with the decrease in her A1c today and is more motivated with change in diet as well.

## 2017-04-12 ENCOUNTER — Ambulatory Visit: Payer: Self-pay

## 2017-05-18 ENCOUNTER — Ambulatory Visit: Payer: Self-pay | Admitting: Family Medicine

## 2017-06-16 ENCOUNTER — Ambulatory Visit: Payer: Self-pay | Admitting: Family Medicine

## 2017-07-06 ENCOUNTER — Other Ambulatory Visit: Payer: Self-pay

## 2017-07-06 ENCOUNTER — Ambulatory Visit (INDEPENDENT_AMBULATORY_CARE_PROVIDER_SITE_OTHER): Payer: Self-pay | Admitting: Family Medicine

## 2017-07-06 ENCOUNTER — Ambulatory Visit: Payer: Self-pay | Admitting: Family Medicine

## 2017-07-06 ENCOUNTER — Encounter: Payer: Self-pay | Admitting: Family Medicine

## 2017-07-06 VITALS — BP 106/62 | HR 77 | Temp 97.8°F | Ht 60.0 in | Wt 218.6 lb

## 2017-07-06 DIAGNOSIS — R300 Dysuria: Secondary | ICD-10-CM

## 2017-07-06 DIAGNOSIS — E1165 Type 2 diabetes mellitus with hyperglycemia: Secondary | ICD-10-CM

## 2017-07-06 DIAGNOSIS — Z794 Long term (current) use of insulin: Secondary | ICD-10-CM

## 2017-07-06 DIAGNOSIS — F3289 Other specified depressive episodes: Secondary | ICD-10-CM

## 2017-07-06 LAB — POCT URINALYSIS DIP (MANUAL ENTRY)
Bilirubin, UA: NEGATIVE
Blood, UA: NEGATIVE
Glucose, UA: 500 mg/dL — AB
Ketones, POC UA: NEGATIVE mg/dL
LEUKOCYTES UA: NEGATIVE
Nitrite, UA: NEGATIVE
PROTEIN UA: NEGATIVE mg/dL
Spec Grav, UA: 1.015 (ref 1.010–1.025)
UROBILINOGEN UA: 1 U/dL
pH, UA: 6.5 (ref 5.0–8.0)

## 2017-07-06 LAB — POCT GLYCOSYLATED HEMOGLOBIN (HGB A1C): Hemoglobin A1C: 11.6
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
## 2017-07-06 NOTE — Patient Instructions (Signed)
Thank you for coming to see me today. It was a pleasure! Today we talked about:   Your diabetes.  Please start taking Lantus 25 units daily.  Please schedule an appointment with Dr. Valentina Lucks at the front desk on your way out.  Please continue to check your blood sugars daily as much as possible.  I have started you on Zoloft (sertraline) 25mg  daily for your depressed mood.  I hope this helps.  You may not see the effects of this medication for 4 to 6 weeks.  Please call 911 or the office to let us know if you are having any side effects including thoughts of hurting yourself.  We will call you with the results from the urine sample and let you know if you need any medications.  I have refilled your metformin and sent in more strips for you.  Please let me know if you need anything else.  Please follow-up with me in 1 month or sooner as needed.  If you have any questions or concerns, please do not hesitate to call the office at [phone].  Take Care,   Martinique Geniya Fulgham, DO  Sertraline tablets Qu es este medicamento? La SERTRALINA se utiliza para tratar la depresin. Este medicamento tambin puede ayudar a Engineer, manufacturing con trastorno obsesivo-compulsivo, trastorno de pnico, estrs postraumtico, trastorno disfrico premenstrual (TDPM) o ansiedad social. Viking medicamento puede ser utilizado para otros usos; si tiene alguna pregunta consulte con su proveedor de atencin mdica o con su farmacutico. MARCAS COMUNES: Zoloft Qu le debo informar a mi profesional de la salud antes de tomar este medicamento? Necesitan saber si usted presenta alguno de los siguientes problemas o situaciones: trastornos de sangrado trastorno bipolar o antecedentes familiares de trastorno bipolar glaucoma enfermedad cardiaca presin sangunea alta antecedentes de ritmo cardiaco irregular antecedentes de bajos niveles de calcio, magnesio o potasio en la sangre si bebe alcohol con frecuencia enfermedad heptica si recibi  terapia electroconvulsiva convulsiones ideas, planes o intento de suicidio; si usted o alguien de su familia ha intentado suicidarse previamente si toma medicamentos que tratan o previenen cogulos sanguneos enfermedad tiroidea una reaccin alrgica o inusual a la sertralina, a otros medicamentos, alimentos, colorantes o conservantes si est embarazada o buscando quedar embarazada si est amamantando a un beb Cmo debo utilizar este medicamento? Tome este medicamento por va oral con un vaso de agua. Siga las instrucciones de la etiqueta del [place]. Puede tomarlo con o sin alimentos. Tome su medicamento a intervalos regulares. No tome su medicamento con una frecuencia mayor a la indicada. No deje de tomar [employer] de repente a menos que as lo indique su mdico. Dejar de Insurance account manager medicamento demasiado rpido puede causar efectos secundarios graves o podra empeorar su afeccin. Su farmacutico le dar una Gua del medicamento especial (MedGuide, nombre en ingls) con cada receta y en cada ocasin que la vuelva a surtir. Asegrese de leer esta informacin cada vez cuidadosamente. Hable con su pediatra para informarse acerca del uso de este medicamento en nios. Aunque este medicamento se puede recetar a nios tan pequeos como de 7 aos de edad con ciertas afecciones, existen precauciones que deben tomarse. Sobredosis: Pngase en contacto inmediatamente con un centro toxicolgico o una sala de urgencia si usted cree que haya tomado demasiado medicamento. ATENCIN: [employer] es solo para usted. No comparta este medicamento con nadie. Qu sucede si me olvido de una dosis? Si olvida una dosis, tmela lo antes posible. Si es casi la hora de la prxima  dosis, tome slo esa dosis. No tome dosis adicionales o dobles. Qu puede interactuar con este medicamento? No tome este medicamento con ninguno de los siguientes frmacos: ciertos medicamentos para infecciones micticas, tales como  fluconazol, itraconazol, ketoconazol, posaconazol y voriconazol cisaprida disulfiram dofetilida linezolida IMAO, tales como Bartelso, Eldepryl, Marplan, Nardil y Parnate metronidazol azul de metileno (inyectado en una vena) pimozida tioridazina ziprasidona Este medicamento tambin puede interactuar con los siguientes medicamentos: alcohol anfetaminas aspirina y medicamentos tipo aspirina ciertos medicamentos para la depresin, ansiedad o trastornos psicticos ciertos medicamentos para el ritmo cardiaco irregular, tales como flecainida, propafenona ciertos medicamentos para la migraa, tales como almotriptn, eletriptn, frovatriptn, naratriptn, rizatriptn, sumatriptn y zolmitriptn ciertos medicamentos para conciliar el sueo ciertos medicamentos para convulsiones, tales como carbamazepina, cido valproico, fenitona ciertos medicamentos que tratan o previenen cogulos sanguneos, tales como warfarina, enoxaparina, dalteparina cimetidina digoxina diurticos fentanilo furazolidona isoniazida litio AINE, medicamentos para el dolor y la inflamacin, como ibuprofeno o naproxeno otros medicamentos que prolongan el intervalo QT (causan un ritmo cardiaco anormal) procarbazina rasagilina suplementos tales [place], kava kava y valeriana tolbutamida tramadol triptfano Puede ser que esta lista no menciona todas las posibles interacciones. Informe a su profesional de [employer]	[place] de [employer] productos a base de hierbas, medicamentos de [place] o suplementos nutritivos que est tomando. Si usted fuma, consume bebidas alcohlicas o si utiliza drogas ilegales, indqueselo tambin a su profesional de [employer]	[place]. Algunas sustancias pueden interactuar con su medicamento. A qu debo estar atento al usar Coca[employer]? Informe a su mdico si sus sntomas no mejoran o si empeoran. Visite a su mdico o a su profesional de la salud para chequear su evolucin peridicamente. Debido que puede ser necesario tomar  este medicamento durante varias semanas para que sea posible observar sus efectos en forma [place], es importante que sigue su tratamiento como recetado por su mdico. Los pacientes y sus familias deben estar atentos si empeora la depresin o ideas suicidas. Tambin est atento a cambios repentinos o severos de [employer] sentirse ansioso, agitado, lleno de pnico, irritable, hostil, agresivo, impulsivo, inquietud severa, demasiado excitado y hiperactivo o dificultad para conciliar el sueo. Si esto ocurre, especialmente al comenzar con un tratamiento antidepresivo o al cambiar de dosis, comunquese con su profesional de [employer]	[place]. Puede experimentar somnolencia o mareos. No conduzca ni utilice maquinaria, ni haga nada que Associate Professor en estado de alerta hasta que sepa cmo le afecta este medicamento. No se siente ni se ponga de pie con rapidez, especialmente si es un paciente de edad avanzada. Esto reduce el riesgo de mareos o [employer]. El alcohol puede interferir con el efecto del medicamento. Evite consumir bebidas alcohlicas. Se le podr secar la boca. Masticar chicle sin azcar, chupar caramelos duros y tomar agua en abundancia le ayudar a mantener la boca hmeda. Si el problema no desaparece o es severo, consulte a su mdico. Qu efectos secundarios puedo tener al [employer] este medicamento? Efectos secundarios que debe informar a su mdico o a Barrister's clerk de la salud tan pronto como sea posible: Chief of Staff, como erupcin cutnea, comezn/picazn o urticarias, e hinchazn de la cara, los labios o la lengua ansiedad heces de color negro y aspecto alquitranado cambios en la visin confusin estado de nimo elevado, menor necesidad de dormir, pensamientos acelerados, conducta impulsiva dolor ocular ritmo cardiaco rpido, irregular sensacin de desmayos o aturdimiento, cadas sensacin de agitacin, enojo o irritabilidad alucinaciones, prdida del contacto con la realidad prdida  de  equilibrio o coordinacin prdida de memoria ereccin dolorosa o prolongada inquietud, caminar de un lado a otro, incapacidad para quedarse quieto convulsiones rigidez de los [employer] ideas suicidas u otros cambios en el estado de nimo dificultad para conciliar el sueo sangrado o moretones inusuales cansancio o debilidad inusual vmito Efectos secundarios que generalmente no requieren atencin mdica (infrmelos a su mdico o a Barrister's clerk de la salud si persisten o si son molestos): cambios en el apetito o el peso cambios en el deseo o desempeo sexual diarrea aumento de la sudoracin indigestin, nuseas temblores Puede ser que esta lista no menciona todos los posibles efectos secundarios. Comunquese a su mdico por asesoramiento mdico [employer]. Usted puede informar los efectos secundarios a la FDA por telfono al 1-800-FDA-1088. Dnde debo guardar mi medicina? Mantngala fuera del alcance de los nios. [place]dela a [employer], entre 15 y 55 grados C (97 y 67 grados F). Deseche todo el medicamento que no haya utilizado, despus de la fecha de vencimiento. ATENCIN: Este folleto es un resumen. Puede ser que no cubra toda la posible informacin. Si usted tiene preguntas acerca de esta medicina, consulte con su mdico, su farmacutico o su profesional de Technical sales engineer.  2018 Elsevier/Gold Standard (2016-03-31 00:00:00)

## 2017-07-08 LAB — URINE CULTURE

## 2017-07-11 ENCOUNTER — Telehealth: Payer: Self-pay

## 2017-07-11 MED ORDER — METFORMIN HCL ER 500 MG PO TB24: 1000.0000 mg | ORAL_TABLET | Freq: Two times a day (BID) | ORAL | 4 refills | Status: DC

## 2017-07-11 MED ORDER — GLUCOSE BLOOD VI STRP: ORAL_STRIP | 12 refills | Status: DC

## 2017-07-11 MED ORDER — METFORMIN HCL ER 500 MG PO TB24
1000.0000 mg | ORAL_TABLET | Freq: Two times a day (BID) | ORAL | 4 refills | Status: DC
Start: 2017-07-11 — End: 2017-07-11

## 2017-07-11 MED ORDER — SERTRALINE HCL 25 MG PO TABS: 25.0000 mg | ORAL_TABLET | Freq: Every day | ORAL | 2 refills | Status: DC

## 2017-07-11 NOTE — Assessment & Plan Note (Signed)
Patient previously taking Lantus 25 units nightly, however because she has not been seen by a doctor. and did not trust to come into the office patient was stretching her Lantus by taking 20 units nightly.  A1c today 11.6.  Patient instructed to start taking 20 units of Lantus and is to schedule an appointment with pharmacy Dr. Valentina Lucks in order to go over her diabetic management.

## 2017-07-11 NOTE — Assessment & Plan Note (Signed)
Patient reporting increased frequency and burning upon urination.  UA and urine culture obtained.  UA negative for signs of infection.  UA positive for glucose and urine which is expected with sugar seen on glucose monitor and an A1c of 11.6.

## 2017-07-11 NOTE — Telephone Encounter (Signed)
Left message for patient to return my call. If she call back, please inform of normal urine results, no infection.

## 2017-07-11 NOTE — Telephone Encounter (Signed)
-----   Message from Martinique Shirley, DO sent at 07/10/2017 10:25 PM EDT ----- Please call and inform patient of normal urine with no signs of infection.

## 2017-07-11 NOTE — Assessment & Plan Note (Addendum)
Patient previously on sertraline for depression.  Patient has not been on this for years however.  Patient denies any significant side effects with this medication.  PHQ 9 score today of 19.  Will start patient on sertraline 25 mg daily.  Patient educated on medication taking 4 to 6 weeks before this might work.  Also instructed to call 911 or go to the ED if patient starts having thoughts or plans to harm herself.  Given handout on medication.  Patient to see me back in 1 month in order to check up on how medication is working for her to see if she is noticed any changes.  Also to address any other concerns the patient may have.

## 2017-07-14 ENCOUNTER — Encounter: Payer: Self-pay | Admitting: Pharmacist

## 2017-07-14 ENCOUNTER — Ambulatory Visit (INDEPENDENT_AMBULATORY_CARE_PROVIDER_SITE_OTHER): Payer: Self-pay | Admitting: Pharmacist

## 2017-07-14 DIAGNOSIS — Z794 Long term (current) use of insulin: Secondary | ICD-10-CM

## 2017-07-14 DIAGNOSIS — E1165 Type 2 diabetes mellitus with hyperglycemia: Secondary | ICD-10-CM

## 2017-07-14 MED ORDER — METFORMIN HCL ER 500 MG PO TB24: 1000.0000 mg | ORAL_TABLET | Freq: Two times a day (BID) | ORAL | 4 refills | Status: DC

## 2017-07-14 MED ORDER — INSULIN NPH (HUMAN) (ISOPHANE) 100 UNIT/ML ~~LOC~~ SUSP: 10.0000 [IU] | Freq: Two times a day (BID) | SUBCUTANEOUS | 11 refills | Status: DC

## 2017-07-14 NOTE — Assessment & Plan Note (Signed)
Diabetes longstanding diagnosed currently uncontrolled. Patient reports hypoglycemic events and is able to verbalize appropriate hypoglycemia management plan. Patient denies adherence with medication. Control is suboptimal due to financial issues obtaining medications. Patient reports taking metformin twice daily which will be continued. Patient will begin taking insulin NPH 10 units twice daily. Patient demonstrated appropriate technique for administering insulin.

## 2017-07-14 NOTE — Patient Instructions (Addendum)
Thank you for visiting [place] today!  1. START Insulin NPH 10 units twice daily  2. CONTINUE metformin 1,000 mg twice daily   Gracias por visitarnos hoy!  1. INICIO Insulina NPH 10 [employer].  2. CONTINUAR metformina 1.000 mg [place].

## 2017-07-14 NOTE — Progress Notes (Signed)
    S:    Chief Complaint  Patient presents with  . Medication Management    diabetes   Patient arrives in good spirits and unaccompanied. Presents for diabetes evaluation, education, and management at the request of Dr. Martinique Shirley. Patient was referred on 07/06/17. Patient was last seen by Dr. Melvia Heaps on 07/06/17. Today's visit interpreted by Shonna Chock. Patient currently pays for medications with orange card at the [facility].  Patient reports Diabetes was diagnosed in 2005. Patient denies adherence with medications. Has not used insulin in about a year due to cost. Patient denies taking any prescription medications except metformin.  Patient reports hypoglycemic events. Patient checks blood glucose in the morning, at lunch, and occasionally at bed time. Glucose meter readings vary from 90s to over 500. Patient reports previously being controlled on insulin with an  A1C of 7.  Patient reported dietary habits: Eats 3 meals/day Breakfast: milk, cereal Lunch: chicken broth, eggs, vegetables Dinner: chicken broth, vegetables, eggs, sometimes meat, rice or tortillas  O:  Physical Exam  Constitutional: She appears well-developed and well-nourished.  Vitals reviewed.  Review of Systems  All other systems reviewed and are negative.  Lab Results  Component Value Date   HGBA1C 11.6 07/06/2017   There were no vitals filed for this visit.  A/P: Diabetes longstanding diagnosed currently uncontrolled. Patient reports hypoglycemic events and is able to verbalize appropriate hypoglycemia management plan. Patient denies adherence with medication. Control is suboptimal due to financial issues obtaining medications. Patient reports taking metformin twice daily which will be continued. Patient will begin taking insulin NPH 10 units twice daily. Patient demonstrated appropriate technique for administering insulin.  Written patient instructions provided. Total time in face to face  counseling 30 minutes. Follow up in Pharmacist Clinic Visit after visit with Dr. Enid Derry.   Patient seen with Richardine Service, PharmD Candidate and Angus Seller, PharmD, PGY1 Pharmacy Resident.

## 2017-07-31 ENCOUNTER — Ambulatory Visit: Payer: Self-pay | Admitting: Family Medicine

## 2017-08-14 NOTE — Progress Notes (Signed)
Subjective:    Patient ID: [name], female    DOB: Aug 03, 1975, 42 y.o.   MRN: [id number]   CC: Diabetes follow-up  HPI:  Diabetes:  Last A1c 11.6 on 4/25-on review of glucose meter patient's sugars ranging from 108-240, which is greatly improved from previous Taking medications: metformin 1,000mg  BID, 10 U NPH BID Patient is currently not taking aspirin or statin-previously attempted to get patient on both of these Last eye exam:  Patient to be referred to eye doctor Last foot exam: up to date ROS: denies diaphoresis, LOC, polyuria, polydipsia  Health Maintenance: Last pap smear 2015  Blood in stool and abdominal pain: -Patient has ongoing had ongoing blood in stool for months -Patient reports that she is having worsening pain in her abdomen in her LLQ -Patient reports that she is due for another colonoscopy. -On chart review it seems as if patient was supposed to have repeat colonoscopy 1 month following previous test in 2016 which at the time showed internal hemorrhoids -Patient reports that blood is bright red  Shortness of breath on exertion with chest pressure: -Patient reports that she has had shortness of breath on exertion with chest pressure for 4 months. -This is not worsened. -Patient feels as if something is sitting on her chest at the times and she has a shortness of breath on exertion -Patient does report that she has a history of anxiety -Patient does not recall last time she had an EKG -Patient also reports dizziness upon standing   Dysuria: -Patient reports that she is still having burning on urination. -Reports that this has improved some with her improvement of her sugars -Reports is this is no worse than at her previous visit -Denies any fevers or chills   Smoking status reviewed  Review of Systems Per HPI, also denies recent illness, fever, headache, changes in vision, weakness   Patient Active Problem List   Diagnosis Date Noted    . Dyspnea on exertion 08/22/2017  . Pain in tooth 12/14/2016  . Onychomycosis 10/26/2016  . Uncontrolled type 2 diabetes mellitus with hyperglycemia, with long-term current use of insulin ([place]) 10/21/2016  . Healthcare maintenance 06/16/2015  . Diabetic neuropathy ([place]) 04/25/2014  . GI bleed 03/19/2014  . Blood in stool 11/08/2013  . Anemia 12/21/2012  . Numbness of face 08/21/2012  . Seasonal allergies 07/26/2012  . Dysuria 07/26/2012  . Back pain 04/20/2012  . Hepatic steatosis 10/01/2011  . Depression 05/26/2011  . Hypertension 05/26/2011  . GERD 09/17/2008  . Morbid obesity ([place]) 07/23/2008     Objective:  BP 110/78   Pulse 84   Temp 98 F (36.7 C) (Oral)   Ht 4\' 11"  (1.499 m)   Wt 101.2 kg (223 lb)   LMP 07/26/2017 (Approximate)   SpO2 98%   BMI 45.04 kg/m  Vitals and nursing note reviewed  General: NAD, pleasant, obese HEENT: Atraumatic. Normocephalic Neck: Supple, no JVD Cardiac: RRR, normal heart sounds, no murmurs Respiratory: CTAB, normal effort Abdomen: soft, tender in LLQ and periumbilical area, nondistended, normoactive bowel sounds Extremities: no edema or cyanosis. WWP. Normal gait Skin: warm and dry, no rashes noted Neuro: alert and oriented, no focal deficits Psych: normal affect  Assessment & Plan:    Blood in stool Patient to be referred to GI for follow-up colonoscopy, as patient was supposed to have repeat 1 month following her colonoscopy in 2016.  FOBT deferred today in office due to patient preference.  CBC with no signs of anemia.  Uncontrolled type 2 diabetes mellitus with hyperglycemia, with long-term current use of insulin ([place]) Patient to continue with current regimen of 10 units of NPH twice daily with metformin 2000 mg.  A1c 9.9 today improved from 11.6 on 4/25.  Patient denies any symptoms of low blood sugar.  Dyspnea on exertion Patient reports shortness of breath on exertion with some chest pressure that has been going on for  4 months.  This is not worsened.  This is been stable.  EKG today in office with NSR.  CBC with no signs of anemia.  BMP WNL.  Lipid panel grossly normal.  Patient's last TSH was in 2016 and was normal.  Patient given strict return precautions.   I believe there is a component of anxiety to patient's chest pressure and shortness of breath as it became noticeable around the time patient was having more stress regarding her daughter and hospitalizations.  At previous visit patient was to start Zoloft in an attempt to improve her depression and anxiety, however patient no longer desires to try this medication and never picked up her prescription.  Dysuria At recent visit on 4/25 patient with UA and urine culture that grew no bacteria.  No signs of UTI and Aleve but this is likely due to patient's uncontrolled diabetes, as last UA had >500 glucose.  Health Maintenance: Patient to schedule a pap smear  Interpreter used: yes  Martinique Richel Millspaugh, DO Family Medicine Resident PGY-1

## 2017-08-15 ENCOUNTER — Other Ambulatory Visit: Payer: Self-pay

## 2017-08-15 ENCOUNTER — Encounter: Payer: Self-pay | Admitting: Family Medicine

## 2017-08-15 ENCOUNTER — Ambulatory Visit (HOSPITAL_COMMUNITY)
Admission: RE | Admit: 2017-08-15 | Discharge: 2017-08-15 | Disposition: A | Discharge status: Home / Self Care | Payer: Self-pay | Source: Ambulatory Visit | Attending: Family Medicine | Admitting: Family Medicine

## 2017-08-15 ENCOUNTER — Ambulatory Visit (INDEPENDENT_AMBULATORY_CARE_PROVIDER_SITE_OTHER): Payer: Self-pay | Admitting: Family Medicine

## 2017-08-15 VITALS — BP 110/78 | HR 84 | Temp 98.0°F | Ht 59.0 in | Wt 223.0 lb

## 2017-08-15 DIAGNOSIS — Z794 Long term (current) use of insulin: Secondary | ICD-10-CM

## 2017-08-15 DIAGNOSIS — E1165 Type 2 diabetes mellitus with hyperglycemia: Secondary | ICD-10-CM

## 2017-08-15 DIAGNOSIS — R0609 Other forms of dyspnea: Secondary | ICD-10-CM

## 2017-08-15 DIAGNOSIS — R300 Dysuria: Secondary | ICD-10-CM

## 2017-08-15 DIAGNOSIS — K921 Melena: Secondary | ICD-10-CM

## 2017-08-15 LAB — POCT GLYCOSYLATED HEMOGLOBIN (HGB A1C): HbA1c, POC (controlled diabetic range): 9.9 % — AB (ref 0.0–7.0)

## 2017-08-15 NOTE — Patient Instructions (Addendum)
Thank you for coming to see me today. It was a pleasure! Today we talked about:   Your diabetes. Please continue metformin 1000mg  twice daily and NPH 10 U twice daily. You may continue to feel a little bad as your sugars start to get better, but if you feel bad, check your sugar and if it is not below 80, do not worry.   You burning on urination should get better with improving sugars, as your UA was normal, please continue to improve on your sugars and it should help.   The Gastroenterologist will call you with an appointment to get you another colonoscopy.  Your EKG was normal. Please go to the ED if you start having chest pain that will not go away or weakness on one side of your body. You can call 911 if this happens.   Please follow-up with me for a pap smear.  If you have any questions or concerns, please do not hesitate to call the office at [phone].  Take Care,   Martinique Mena Lienau, DO

## 2017-08-16 LAB — BASIC METABOLIC PANEL
BUN/Creatinine Ratio: 26 — ABNORMAL HIGH (ref 9–23)
BUN: 11 mg/dL (ref 6–24)
CALCIUM: 9.2 mg/dL (ref 8.7–10.2)
CHLORIDE: 103 mmol/L (ref 96–106)
CO2: 21 mmol/L (ref 20–29)
Creatinine, Ser: 0.43 mg/dL — ABNORMAL LOW (ref 0.57–1.00)
GFR, EST AFRICAN AMERICAN: 145 mL/min/{1.73_m2} (ref >59)
GFR, EST NON AFRICAN AMERICAN: 126 mL/min/{1.73_m2} (ref >59)
Glucose: 126 mg/dL — ABNORMAL HIGH (ref 65–99)
POTASSIUM: 4 mmol/L (ref 3.5–5.2)
SODIUM: 140 mmol/L (ref 134–144)

## 2017-08-16 LAB — CBC
HEMOGLOBIN: 12.6 g/dL (ref 11.1–15.9)
Hematocrit: 38.3 % (ref 34.0–46.6)
MCH: 26 pg — AB (ref 26.6–33.0)
MCHC: 32.9 g/dL (ref 31.5–35.7)
MCV: 79 fL (ref 79–97)
PLATELETS: 229 10*3/uL (ref 150–450)
RBC: 4.84 x10E6/uL (ref 3.77–5.28)
RDW: 14.5 % (ref 12.3–15.4)
WBC: 7.5 10*3/uL (ref 3.4–10.8)

## 2017-08-16 LAB — LIPID PANEL
CHOLESTEROL TOTAL: 142 mg/dL (ref 100–199)
Chol/HDL Ratio: 3.9 ratio (ref 0.0–4.4)
HDL: 36 mg/dL — ABNORMAL LOW (ref >39)
LDL Calculated: 76 mg/dL (ref 0–99)
TRIGLYCERIDES: 152 mg/dL — AB (ref 0–149)
VLDL Cholesterol Cal: 30 mg/dL (ref 5–40)

## 2017-08-18 ENCOUNTER — Telehealth: Payer: Self-pay

## 2017-08-18 NOTE — Telephone Encounter (Signed)
-----   Message from Martinique Shirley, DO sent at 08/18/2017 10:12 AM EDT ----- Please call patient with normal results. If she is still having dizziness, then please schedule her another follow up appointment with me.  Thank you!

## 2017-08-18 NOTE — Telephone Encounter (Signed)
Pt contacted and informed of normal results.  

## 2017-08-22 ENCOUNTER — Encounter: Payer: Self-pay | Admitting: Gastroenterology

## 2017-08-22 DIAGNOSIS — R0609 Other forms of dyspnea: Secondary | ICD-10-CM | POA: Insufficient documentation

## 2017-08-22 NOTE — Assessment & Plan Note (Signed)
Patient to continue with current regimen of 10 units of NPH twice daily with metformin 2000 mg.  A1c 9.9 today improved from 11.6 on 4/25.  Patient denies any symptoms of low blood sugar.

## 2017-08-22 NOTE — Assessment & Plan Note (Signed)
At recent visit on 4/25 patient with UA and urine culture that grew no bacteria.  No signs of UTI and Aleve but this is likely due to patient's uncontrolled diabetes, as last UA had >500 glucose.

## 2017-08-22 NOTE — Assessment & Plan Note (Signed)
Patient to be referred to GI for follow-up colonoscopy, as patient was supposed to have repeat 1 month following her colonoscopy in 2016.  FOBT deferred today in office due to patient preference.  CBC with no signs of anemia.

## 2017-08-22 NOTE — Assessment & Plan Note (Addendum)
Patient reports shortness of breath on exertion with some chest pressure that has been going on for 4 months.  This is not worsened.  This is been stable.  EKG today in office with NSR.  CBC with no signs of anemia.  BMP WNL.  Lipid panel grossly normal.  Patient's last TSH was in 2016 and was normal.  Patient given strict return precautions.   I believe there is a component of anxiety to patient's chest pressure and shortness of breath as it became noticeable around the time patient was having more stress regarding her daughter and hospitalizations.  At previous visit patient was to start Zoloft in an attempt to improve her depression and anxiety, however patient no longer desires to try this medication and never picked up her prescription.

## 2017-10-02 ENCOUNTER — Ambulatory Visit: Payer: Self-pay | Admitting: Family Medicine

## 2017-10-02 ENCOUNTER — Ambulatory Visit (INDEPENDENT_AMBULATORY_CARE_PROVIDER_SITE_OTHER): Payer: Self-pay | Admitting: Family Medicine

## 2017-10-02 ENCOUNTER — Encounter: Payer: Self-pay | Admitting: Family Medicine

## 2017-10-02 VITALS — BP 110/80 | HR 84 | Temp 97.6°F | Wt 217.4 lb

## 2017-10-02 DIAGNOSIS — R300 Dysuria: Secondary | ICD-10-CM

## 2017-10-02 DIAGNOSIS — N10 Acute tubulo-interstitial nephritis: Secondary | ICD-10-CM

## 2017-10-02 LAB — POCT URINALYSIS DIP (MANUAL ENTRY)
BILIRUBIN UA: NEGATIVE
BILIRUBIN UA: NEGATIVE mg/dL
Glucose, UA: 500 mg/dL — AB
NITRITE UA: NEGATIVE
SPEC GRAV UA: 1.025 (ref 1.010–1.025)
Urobilinogen, UA: 0.2 E.U./dL
pH, UA: 5.5 (ref 5.0–8.0)

## 2017-10-02 MED ORDER — LEVOFLOXACIN 750 MG PO TABS: 750.0000 mg | ORAL_TABLET | Freq: Every day | ORAL | 0 refills | Status: DC

## 2017-10-02 NOTE — Progress Notes (Signed)
     Subjective: Chief Complaint  Patient presents with  . Dysuria     HPI: [name] is a 42 y.o. presenting to clinic today to discuss the following:  Pain and burning on urination with back pain Patient reports 2-3 days of painful urination described as burning. She is also going more frequently. No noticeable change in odor. She also has had coincided back pain at the CVA. She states it does not hurt all the time but is tender and will limit her movement. She reports no fevers, abdominal pain or vomiting but has been nauseated. She does not recall having symptoms like this before.  Health Maintenance: None     ROS noted in HPI.   Past Medical, Surgical, Social, and Family History Reviewed & Updated per EMR.   Pertinent Historical Findings include:   Social History   Tobacco Use  Smoking Status Never Smoker  Smokeless Tobacco Never Used    Objective: BP 110/80 (BP Location: Right Arm, Patient Position: Sitting, Cuff Size: Large)   Pulse 84   Temp 97.6 F (36.4 C) (Oral)   Wt 217 lb 6.4 oz (98.6 kg)   SpO2 96%   BMI 43.91 kg/m  Vitals and nursing notes reviewed  Physical Exam Gen: Alert and Oriented x 3, NAD HEENT: Normocephalic, atraumatic, PERRLA, EOMI, non-erythematous pharyngeal mucosa, no exudates Neck: trachea midline, no thyroidmegaly, no LAD CV: RRR, no murmurs, normal S1, S2 split, +2 pulses dorsalis pedis bilaterally Resp: CTAB, no wheezing, rales, or rhonchi, comfortable work of breathing Abd: non-distended, non-tender, soft, +bs in all four quadrants, no hepatomegaly GU: CVA tenderness on the left MSK: Moves all four extremities Ext: no clubbing, cyanosis, or edema Neuro: CN II-XII intact, no focal or gross deficits Skin: warm, dry, intact, no rashes  No results found for this or any previous visit (from the past 72 hour(s)).  Assessment/Plan:  Acute pyelonephritis Etiologies considered include UTI versus Pyelonephritis versus  viral Gastroenteritis. [name] is unlikely as patient is not vomiting or having abdominal pain. U/A was consistent with Urinary tract infection and history and physical make pyelonephritis more likely. Considered possibly poorly controlled diabetes causing DKA but patient is not spilling a significant amount of glucose in her urine, is not experiencing weight loss, polydypsia, and has been compliant with medication.  Levofloxacin for 7 total days due to concern for pyelonephritis. Red flag symptoms and reasons to return discussed.   PATIENT EDUCATION PROVIDED: See AVS    Diagnosis and plan along with any newly prescribed medication(s) were discussed in detail with this patient today. The patient verbalized understanding and agreed with the plan. Patient advised if symptoms worsen return to clinic or ER.   Health Maintainance:   Orders Placed This Encounter  Procedures  . POCT urinalysis dipstick    Meds ordered this encounter  Medications  . levofloxacin (LEVAQUIN) 750 MG tablet    Sig: Take 1 tablet (750 mg total) by mouth daily.    Dispense:  7 tablet    Refill:  0    Mary Rutherford, DO 10/02/2017, 4:37 PM PGY-2 [place]

## 2017-10-02 NOTE — Patient Instructions (Addendum)
It was great to meet you today! Thank you for letting me participate in your care!  Today, we discussed your urinary pain and frequency. I believe you have a urinary tract infection that has reached your kidneys called Pyelonephritis. Please take the antibiotic Levofloxaxin 750mg  once a day for 7 total days. Please make an appointment to be seen if you do not get better.  You can take Tylenol and Ibuprofen for pain.  Be well, Harolyn Rutherford, DO PGY-2, [facility]

## 2017-10-02 NOTE — Progress Notes (Signed)
.  urine dip

## 2017-10-04 LAB — URINE CULTURE

## 2017-10-09 DIAGNOSIS — N10 Acute tubulo-interstitial nephritis: Secondary | ICD-10-CM | POA: Insufficient documentation

## 2017-10-09 NOTE — Assessment & Plan Note (Signed)
Etiologies considered include UTI versus Pyelonephritis versus viral Gastroenteritis. [name] is unlikely as patient is not vomiting or having abdominal pain. U/A was consistent with Urinary tract infection and history and physical make pyelonephritis more likely. Considered possibly poorly controlled diabetes causing DKA but patient is not spilling a significant amount of glucose in her urine, is not experiencing weight loss, polydypsia, and has been compliant with medication.  Levofloxacin for 7 total days due to concern for pyelonephritis. Red flag symptoms and reasons to return discussed.

## 2017-10-11 ENCOUNTER — Ambulatory Visit: Payer: Self-pay

## 2017-10-12 LAB — HM DIABETES EYE EXAM

## 2017-10-17 ENCOUNTER — Ambulatory Visit: Payer: Self-pay | Admitting: Gastroenterology

## 2017-10-20 ENCOUNTER — Other Ambulatory Visit: Payer: Self-pay

## 2017-10-20 ENCOUNTER — Ambulatory Visit (INDEPENDENT_AMBULATORY_CARE_PROVIDER_SITE_OTHER): Payer: Self-pay | Admitting: Family Medicine

## 2017-10-20 ENCOUNTER — Other Ambulatory Visit (HOSPITAL_COMMUNITY)
Admission: RE | Admit: 2017-10-20 | Discharge: 2017-10-20 | Disposition: A | Discharge status: Home / Self Care | Payer: Self-pay | Source: Ambulatory Visit | Attending: Family Medicine | Admitting: Family Medicine

## 2017-10-20 VITALS — BP 110/80 | HR 68 | Temp 97.6°F | Wt 218.0 lb

## 2017-10-20 DIAGNOSIS — R300 Dysuria: Secondary | ICD-10-CM | POA: Insufficient documentation

## 2017-10-20 DIAGNOSIS — B3731 Acute candidiasis of vulva and vagina: Secondary | ICD-10-CM

## 2017-10-20 DIAGNOSIS — B373 Candidiasis of vulva and vagina: Secondary | ICD-10-CM

## 2017-10-20 LAB — POCT URINALYSIS DIP (MANUAL ENTRY)
Bilirubin, UA: NEGATIVE
Blood, UA: NEGATIVE
Nitrite, UA: NEGATIVE
Spec Grav, UA: 1.025 (ref 1.010–1.025)
UROBILINOGEN UA: 0.2 U/dL
pH, UA: 6 (ref 5.0–8.0)

## 2017-10-20 LAB — POCT WET PREP (WET MOUNT)
CLUE CELLS WET PREP WHIFF POC: NEGATIVE
Trichomonas Wet Prep HPF POC: ABSENT

## 2017-10-20 LAB — POCT UA - MICROSCOPIC ONLY

## 2017-10-20 MED ORDER — FLUCONAZOLE 150 MG PO TABS: 150.0000 mg | ORAL_TABLET | Freq: Once | ORAL | 0 refills | Status: AC

## 2017-10-20 NOTE — Patient Instructions (Signed)
Candidiasis vaginal en los adultos °(Gastrointestinal Yeast Infection, Adult) °La candidiasis vaginal es una afección que causa dolor, hinchazón y enrojecimiento (inflamación) de la vagina. También causa secreción vaginal. Esta es una enfermedad frecuente. Algunas mujeres contraen esta infección con frecuencia. °CAUSAS °La causa de la infección es un cambio en el equilibrio normal de los hongos (cándida) y las bacterias que viven en la vagina. Esta alteración deriva en el crecimiento excesivo de los hongos, lo que causa la inflamación. °FACTORES DE RIESGO °Es más probable que esta afección se manifieste en: °· Las mujeres que toman antibióticos. °· Las mujeres que tienen diabetes. °· Las mujeres que toman anticonceptivos. °· Las mujeres que están embarazadas. °· Las mujeres que se hacen duchas vaginales con frecuencia. °· Las mujeres que tienen un sistema de defensa (inmunitario) débil. °· Las mujeres que han tomado corticoides durante mucho tiempo. °· Las mujeres que usan ropa ajustada con frecuencia. °SÍNTOMAS °Los síntomas de esta afección incluyen lo siguiente: °· Secreción vaginal blanca y espesa. °· Hinchazón, picazón, enrojecimiento e irritación de la vagina. Los labios de la vagina (vulva) también se pueden infectar. °· Dolor o ardor al orinar. °· Dolor durante las relaciones sexuales. °DIAGNÓSTICO °Esta afección se diagnostica mediante la historia clínica y un examen físico. Este incluye un examen pélvico. El médico examinará una muestra de la secreción vaginal con un microscopio. Probablemente el médico envíe esta muestra al laboratorio para analizarla y confirmar el diagnóstico. °TRATAMIENTO °Esta afección se trata con medicamentos. Los medicamentos pueden ser recetados o de venta libre. Podrán indicarle que use uno o más de lo siguiente: °· Medicamentos por vía oral. °· Medicamentos que se aplican como una crema. °· Medicamentos que se colocan directamente en la vagina (óvulos vaginales). °INSTRUCCIONES  PARA EL CUIDADO EN EL HOGAR °· Tome o aplíquese los medicamentos de venta libre y recetados solamente como se lo haya indicado el médico. °· No tenga relaciones sexuales hasta que el médico lo autorice. Comunique a su compañero sexual que tiene una infección por hongos. Esas personas deben consultar al médico si tienen síntomas. °· No use ropa ajustada, como pantis o pantalones ajustados. °· Evite el uso de tampones hasta que el médico lo autorice. °· Consuma más yogur. Esto puede ayudar a evitar la recurrencia de la candidiasis. °· Intente darse un baño de asiento para aliviar las molestias. Se trata de un baño de agua tibia que se toma mientras se está sentado. El agua solo debe llegar hasta las caderas y cubrir las nalgas. Hágalo 3 o 4 veces al día o como se lo haya indicado el médico. °· No se haga duchas vaginales. °· Use ropa interior transpirable de algodón. °· Si tiene diabetes, mantenga bajo control los niveles de azúcar en la sangre. °SOLICITE ATENCIÓN MÉDICA SI: °· Tiene fiebre. °· Los síntomas desaparecen y luego reaparecen. °· Los síntomas no mejoran con el tratamiento. °· Los síntomas empeoran. °· Aparecen nuevos síntomas. °· Aparecen ampollas alrededor o adentro de la vagina. °· Le sale sangre de la vagina y no está menstruando. °· Siente dolor en el abdomen. °Esta información no tiene como fin reemplazar el consejo del médico. Asegúrese de hacerle al médico cualquier pregunta que tenga. °Document Released: 12/08/2004 Document Revised: 06/22/2015 Document Reviewed: 09/01/2014 °Elsevier Interactive Patient Education © 2018 [employer] Inc[employer] ° °

## 2017-10-20 NOTE — Addendum Note (Signed)
Addended by: Dorna Bloom on: 10/20/2017 03:12 PM   Modules accepted: Orders

## 2017-10-20 NOTE — Progress Notes (Signed)
    Subjective:  [name] is a 42 y.o. female who presents to the [facility] today with a chief complaint of dysuria.   HPI: Dysuria Patient presents for recurrent dysuria despite treatment with levofloxacin for UTI.  Dysuria is been present for about 1 week.  No improvement with antibiotics. Urine Cx showed minimal growth of beta hemolytic strep. Patient is also had orange vaginal discharge.  Endorses "small sores" around her vaginal area.  Denies any bleeding from the vagina and hematuira.  Denies any back pain, fever chills, nausea vomiting.  ROS: Per HPI   Objective:  Physical Exam: BP 110/80   Pulse 68   Temp 97.6 F (36.4 C) (Oral)   Wt 218 lb (98.9 kg)   LMP 09/20/2017   SpO2 97%   BMI 44.03 kg/m   Gen: NAD, resting comfortably CV: RRR with no murmurs appreciated Pulm: NWOB, CTAB with no crackles, wheezes, or rhonchi GI: Normal bowel sounds present. Soft, Nontender, Nondistended. GU: No external lesions, thick cervical mucus, no cervical motion tenderness,  MSK: no edema, cyanosis, or clubbing noted Skin: warm, dry Neuro: grossly normal, moves all extremities Psych: Normal affect and thought content  No results found for this or any previous visit (from the past 72 hour(s)).   Assessment/Plan:  No problem-specific Assessment & Plan notes found for this encounter.    Lab Orders     UA/M w/rflx Culture, Comp     POCT urinalysis dipstick     POCT Wet Prep [facility])  No orders of the defined types were placed in this encounter.   Marny Lowenstein, MD, MS FAMILY MEDICINE RESIDENT - PGY1 10/20/2017 1:51 PM

## 2017-10-20 NOTE — Assessment & Plan Note (Addendum)
Patient presents with persistent dysuria despite treatment antibiotics.  Previous testing did not reveal UTI from culture.  Patient is also not having vaginal discharge.  Endorses small sores.  Testing positive for vaginal candidiasis likely source of her symptoms, but given presence of sores, will also broaden scope testing to include STIs GC chlamydia HSV, HIV, RPR.  We will also retest urine to ensure that no UTI was missed.

## 2017-10-21 ENCOUNTER — Encounter: Payer: Self-pay | Admitting: Family Medicine

## 2017-10-21 LAB — RPR: RPR Ser Ql: NONREACTIVE

## 2017-10-21 LAB — HIV ANTIBODY (ROUTINE TESTING W REFLEX): HIV Screen 4th Generation wRfx: NONREACTIVE

## 2017-10-22 LAB — URINE CULTURE: Organism ID, Bacteria: NO GROWTH

## 2017-10-23 LAB — CERVICOVAGINAL ANCILLARY ONLY
Chlamydia: NEGATIVE
NEISSERIA GONORRHEA: NEGATIVE

## 2017-11-02 ENCOUNTER — Encounter: Payer: Self-pay | Admitting: Family Medicine

## 2017-11-12 ENCOUNTER — Encounter (HOSPITAL_COMMUNITY): Payer: Self-pay | Admitting: Emergency Medicine

## 2017-11-12 ENCOUNTER — Other Ambulatory Visit: Payer: Self-pay

## 2017-11-12 ENCOUNTER — Emergency Department (HOSPITAL_COMMUNITY): Payer: Self-pay

## 2017-11-12 ENCOUNTER — Emergency Department (HOSPITAL_COMMUNITY)
Admission: EM | Admit: 2017-11-12 | Discharge: 2017-11-12 | Disposition: A | Discharge status: Home / Self Care | Payer: Self-pay | Attending: Emergency Medicine | Admitting: Emergency Medicine

## 2017-11-12 DIAGNOSIS — R740 Nonspecific elevation of levels of transaminase and lactic acid dehydrogenase [LDH]: Secondary | ICD-10-CM | POA: Insufficient documentation

## 2017-11-12 DIAGNOSIS — R7401 Elevation of levels of liver transaminase levels: Secondary | ICD-10-CM

## 2017-11-12 DIAGNOSIS — G8929 Other chronic pain: Secondary | ICD-10-CM

## 2017-11-12 DIAGNOSIS — Z794 Long term (current) use of insulin: Secondary | ICD-10-CM | POA: Insufficient documentation

## 2017-11-12 DIAGNOSIS — R102 Pelvic and perineal pain: Secondary | ICD-10-CM

## 2017-11-12 DIAGNOSIS — Z7982 Long term (current) use of aspirin: Secondary | ICD-10-CM | POA: Insufficient documentation

## 2017-11-12 DIAGNOSIS — I10 Essential (primary) hypertension: Secondary | ICD-10-CM | POA: Insufficient documentation

## 2017-11-12 DIAGNOSIS — N3001 Acute cystitis with hematuria: Secondary | ICD-10-CM | POA: Insufficient documentation

## 2017-11-12 DIAGNOSIS — E119 Type 2 diabetes mellitus without complications: Secondary | ICD-10-CM | POA: Insufficient documentation

## 2017-11-12 DIAGNOSIS — Z79899 Other long term (current) drug therapy: Secondary | ICD-10-CM | POA: Insufficient documentation

## 2017-11-12 HISTORY — DX: Disorder of kidney and ureter, unspecified: N28.9

## 2017-11-12 LAB — LIPASE, BLOOD: LIPASE: 34 U/L (ref 11–51)

## 2017-11-12 LAB — CBC
HEMATOCRIT: 43.2 % (ref 36.0–46.0)
Hemoglobin: 14.1 g/dL (ref 12.0–15.0)
MCH: 26.9 pg (ref 26.0–34.0)
MCHC: 32.6 g/dL (ref 30.0–36.0)
MCV: 82.4 fL (ref 78.0–100.0)
Platelets: 232 10*3/uL (ref 150–400)
RBC: 5.24 MIL/uL — AB (ref 3.87–5.11)
RDW: 12.6 % (ref 11.5–15.5)
WBC: 9 10*3/uL (ref 4.0–10.5)

## 2017-11-12 LAB — COMPREHENSIVE METABOLIC PANEL
ALT: 56 U/L — ABNORMAL HIGH (ref 0–44)
AST: 59 U/L — AB (ref 15–41)
Albumin: 3.6 g/dL (ref 3.5–5.0)
Alkaline Phosphatase: 128 U/L — ABNORMAL HIGH (ref 38–126)
Anion gap: 7 (ref 5–15)
BUN: 6 mg/dL (ref 6–20)
CO2: 29 mmol/L (ref 22–32)
Calcium: 9.1 mg/dL (ref 8.9–10.3)
Chloride: 98 mmol/L (ref 98–111)
Creatinine, Ser: 0.5 mg/dL (ref 0.44–1.00)
Glucose, Bld: 293 mg/dL — ABNORMAL HIGH (ref 70–99)
POTASSIUM: 4.4 mmol/L (ref 3.5–5.1)
SODIUM: 134 mmol/L — AB (ref 135–145)
Total Bilirubin: 1.3 mg/dL — ABNORMAL HIGH (ref 0.3–1.2)
Total Protein: 6.9 g/dL (ref 6.5–8.1)

## 2017-11-12 LAB — I-STAT BETA HCG BLOOD, ED (MC, WL, AP ONLY)

## 2017-11-12 LAB — WET PREP, GENITAL
CLUE CELLS WET PREP: NONE SEEN
Sperm: NONE SEEN
TRICH WET PREP: NONE SEEN
YEAST WET PREP: NONE SEEN

## 2017-11-12 LAB — URINALYSIS, ROUTINE W REFLEX MICROSCOPIC
BILIRUBIN URINE: NEGATIVE
Glucose, UA: 250 mg/dL — AB
Ketones, ur: 15 mg/dL — AB
NITRITE: POSITIVE — AB
PH: 6 (ref 5.0–8.0)
Protein, ur: 100 mg/dL — AB

## 2017-11-12 LAB — URINALYSIS, MICROSCOPIC (REFLEX)

## 2017-11-12 MED ORDER — CEPHALEXIN 250 MG PO CAPS
500.0000 mg | ORAL_CAPSULE | Freq: Once | ORAL | Status: AC
  Administered 2017-11-12: 500 mg via ORAL
  Filled 2017-11-12: qty 2

## 2017-11-12 MED ORDER — FAMOTIDINE 20 MG PO TABS
20.0000 mg | ORAL_TABLET | Freq: Once | ORAL | Status: AC
  Administered 2017-11-12: 20 mg via ORAL
  Filled 2017-11-12: qty 1

## 2017-11-12 MED ORDER — CEPHALEXIN 500 MG PO CAPS: 500.0000 mg | ORAL_CAPSULE | Freq: Three times a day (TID) | ORAL | 0 refills | Status: DC

## 2017-11-12 MED ORDER — OMEPRAZOLE 20 MG PO CPDR: 20.0000 mg | DELAYED_RELEASE_CAPSULE | Freq: Every day | ORAL | 0 refills | Status: DC

## 2017-11-12 MED ORDER — GI COCKTAIL ~~LOC~~
30.0000 mL | Freq: Once | ORAL | Status: AC
  Administered 2017-11-12: 30 mL via ORAL
  Filled 2017-11-12: qty 30

## 2017-11-12 MED ORDER — PHENAZOPYRIDINE HCL 100 MG PO TABS
95.0000 mg | ORAL_TABLET | Freq: Once | ORAL | Status: AC
  Administered 2017-11-12: 100 mg via ORAL
  Filled 2017-11-12: qty 1

## 2017-11-12 MED ORDER — TRAMADOL HCL 50 MG PO TABS: 50.0000 mg | ORAL_TABLET | Freq: Four times a day (QID) | ORAL | 0 refills | Status: DC | PRN

## 2017-11-12 NOTE — ED Provider Notes (Signed)
[facility] EMERGENCY DEPARTMENT Provider Note   CSN: [id number] Arrival date & time: 11/12/17  1201     History   Chief Complaint Chief Complaint  Patient presents with  . Dysuria    HPI [name] is a 42 y.o. female who presents with dysuria, abdominal pain/flank pain. PMH significant for chronic chest pain, chronic pelvic pain, DM. She is accompanied by her daughter who translates. She states that about one month ago she started to have dysuria and intermittent flank pain R>L. She also reports chills, epigastric and periumbilical abdominal pain, intermittent N/V, pain with eating, vaginal discharge. She reports urinary symptoms of dysuria, hematuria, frequency. She also started having diarrhea but this was during and after she took antibiotics. She went to her PCP on 7/22 and was prescribed Levaquin for possible pyelonephritis. She was seen again on 8/9 and treated for a yeast infection. Her urine culture did not grow out anything significant. She states her symptoms did improve however they returned over the past couple days. No fever but she states that her "back feels hot". Past surgical hx significant for cholecystectomy.  HPI  Past Medical History:  Diagnosis Date  . Chronic chest pain   . Chronic pelvic pain in female   . DDD (degenerative disc disease), cervical   . Depression   . Diabetes mellitus   . Dizziness   . GERD (gastroesophageal reflux disease)   . H/O echocardiogram 09/2013   normal  . Headache   . Hypertension   . Peripheral neuropathy   . Renal disorder     Patient Active Problem List   Diagnosis Date Noted  . Acute pyelonephritis 10/09/2017  . Dyspnea on exertion 08/22/2017  . Pain in tooth 12/14/2016  . Onychomycosis 10/26/2016  . Uncontrolled type 2 diabetes mellitus with hyperglycemia, with long-term current use of insulin ([place]) 10/21/2016  . Healthcare maintenance 06/16/2015  . Diabetic neuropathy ([place]) 04/25/2014    . GI bleed 03/19/2014  . Blood in stool 11/08/2013  . Anemia 12/21/2012  . Numbness of face 08/21/2012  . Seasonal allergies 07/26/2012  . Dysuria 07/26/2012  . Back pain 04/20/2012  . Hepatic steatosis 10/01/2011  . Depression 05/26/2011  . Hypertension 05/26/2011  . Vagina, candidiasis 11/17/2010  . GERD 09/17/2008  . Morbid obesity ([place]) 07/23/2008    Past Surgical History:  Procedure Laterality Date  . CESAREAN SECTION    . CHOLECYSTECTOMY N/A 04/10/2015   Procedure: LAPAROSCOPIC CHOLECYSTECTOMY ;  Surgeon: Coralie Keens, MD;  Location: [facility];  Service: General;  Laterality: N/A;  . COLONOSCOPY N/A 04/07/2014   TYO:[facility]YOK HH/EPIGASTRIC PAIN/SMALL INTERNAL HEMORRHOIDS/MILD DIVERTICULOSIS IN LEFT COLON  . ESOPHAGOGASTRODUODENOSCOPY N/A 04/07/2014   HTX:HFSFS HH/EPIGASTRIC PAIN/SMALL INTERNAL HEMORRHOIDS/MILD DIVERTICULOSIS IN LEFT COLON  . TUBAL LIGATION       OB History    Gravida  6   Para  3   Term  3   Preterm      AB      Living  3     SAB      TAB      Ectopic      Multiple      Live Births               Home Medications    Prior to Admission medications   Medication Sig Start Date End Date Taking? Authorizing Provider  aspirin EC 81 MG tablet Take 1 tablet (81 mg total) by mouth daily. Patient not taking: Reported on 07/14/2017  10/26/16   Shirley, [place], DO  atorvastatin (LIPITOR) 10 MG tablet Take 1 tablet (10 mg total) by mouth daily. Patient not taking: Reported on 07/14/2017 10/26/16   Shirley, [place], DO  blood glucose meter kit and supplies KIT Dispense based on patient and insurance preference. Use up to four times daily as directed. (FOR ICD-9 250.00, 250.01). 08/11/16   Haney, Alyssa A, MD  fluticasone (FLONASE) 50 MCG/ACT nasal spray Place 2 sprays into both nostrils daily. Patient not taking: Reported on 07/14/2017 09/24/16   Elsie Stain, MD  glucose blood test strip Use as instructed 07/11/17   Shirley, [place], DO   HYDROcodone-acetaminophen (NORCO/VICODIN) 5-325 MG tablet Take 1-2 tablets by mouth every 6 (six) hours as needed. Patient not taking: Reported on 08/11/2016 07/24/16   Virgel Manifold, MD  ibuprofen (ADVIL,MOTRIN) 200 MG tablet Take 400 mg by mouth every 6 (six) hours as needed.    [provider]  Insulin Glargine (LANTUS) 100 UNIT/ML Solostar Pen Inject 25 Units into the skin daily at 10 pm. Patient not taking: Reported on 07/14/2017 10/18/16   Rogue Bussing, MD  insulin NPH Human (HUMULIN N,NOVOLIN N) 100 UNIT/ML injection Inject 0.1 mLs (10 Units total) into the skin 2 (two) times daily before a meal. 07/14/17   Hensel, Jamal Collin, MD  levofloxacin (LEVAQUIN) 750 MG tablet Take 1 tablet (750 mg total) by mouth daily. 10/02/17   Nuala Alpha, DO  metFORMIN (GLUCOPHAGE XR) 500 MG 24 hr tablet Take 2 tablets (1,000 mg total) by mouth 2 (two) times daily with a meal. 07/14/17   Hensel, Jamal Collin, MD  naproxen (NAPROSYN) 375 MG tablet Take 1 tablet (375 mg total) by mouth 2 (two) times daily. Patient not taking: Reported on 08/11/2016 07/24/16   Virgel Manifold, MD  oxyCODONE-acetaminophen (PERCOCET/ROXICET) 5-325 MG tablet Take 1 tablet by mouth every 6 (six) hours as needed for severe pain. Patient not taking: Reported on 08/11/2016 07/05/16   Antonietta Breach, PA-C  promethazine (PHENERGAN) 25 MG tablet Take 1 tablet (25 mg total) by mouth every 6 (six) hours as needed for nausea or vomiting. Patient not taking: Reported on 08/11/2016 07/05/16   Antonietta Breach, PA-C  sertraline (ZOLOFT) 25 MG tablet Take 1 tablet (25 mg total) by mouth daily. Patient not taking: Reported on 07/14/2017 07/11/17   Shirley, [place], DO    Family History Family History  Problem Relation Age of Onset  . Hypertension Mother   . Diabetes Father   . Colon cancer Neg Hx     Social History Social History   Tobacco Use  . Smoking status: Never Smoker  . Smokeless tobacco: Never Used  Substance Use Topics  .  Alcohol use: No  . Drug use: No     Allergies   Patient has no known allergies.   Review of Systems Review of Systems  Constitutional: Positive for chills. Negative for fever.  Gastrointestinal: Positive for abdominal pain, diarrhea, nausea and vomiting.  Genitourinary: Positive for dysuria, flank pain, frequency, hematuria, menstrual problem, pelvic pain, urgency and vaginal discharge. Negative for vaginal bleeding.  All other systems reviewed and are negative.    Physical Exam Updated Vital Signs BP 140/83 (BP Location: Right Arm)   Pulse 82   Temp 98 F (36.7 C) (Oral)   Resp 18   Ht _0  (1.549 m)   Wt 97.5 kg   SpO2 97%   BMI 40.62 kg/m   Physical Exam  Constitutional: She is oriented to person, place,  and time. She appears well-developed and well-nourished. No distress.  Calm, cooperative. NAD. Spanish speaking  HENT:  Head: Normocephalic and atraumatic.  Eyes: Pupils are equal, round, and reactive to light. Conjunctivae are normal. Right eye exhibits no discharge. Left eye exhibits no discharge. No scleral icterus.  Neck: Normal range of motion.  Cardiovascular: Normal rate and regular rhythm.  Pulmonary/Chest: Effort normal and breath sounds normal. No respiratory distress.  Abdominal: Soft. Bowel sounds are normal. She exhibits no distension and no mass. There is tenderness (R CVA tenderness, epigastric tenderness). There is no rebound and no guarding. No hernia.  Genitourinary:  Genitourinary Comments: Pelvic: No inguinal lymphadenopathy or inguinal hernia noted. Normal external genitalia. Moderate pain with speculum insertion. Closed cervical os with normal appearance - no rash or lesions. No significant discharge or bleeding noted from cervix or in vaginal vault. On bimanual examination she has diffuse tenderness. Chaperone present during exam.    Neurological: She is alert and oriented to person, place, and time.  Skin: Skin is warm and dry.  Psychiatric:  She has a normal mood and affect. Her behavior is normal.  Nursing note and vitals reviewed.    ED Treatments / Results  Labs (all labs ordered are listed, but only abnormal results are displayed) Labs Reviewed  COMPREHENSIVE METABOLIC PANEL - Abnormal; Notable for the following components:      Result Value   Sodium 134 (*)    Glucose, Bld 293 (*)    AST 59 (*)    ALT 56 (*)    Alkaline Phosphatase 128 (*)    Total Bilirubin 1.3 (*)    All other components within normal limits  CBC - Abnormal; Notable for the following components:   RBC 5.24 (*)    All other components within normal limits  URINALYSIS, ROUTINE W REFLEX MICROSCOPIC - Abnormal; Notable for the following components:   APPearance HAZY (*)    Specific Gravity, Urine >1.030 (*)    Glucose, UA 250 (*)    Hgb urine dipstick LARGE (*)    Ketones, ur 15 (*)    Protein, ur 100 (*)    Nitrite POSITIVE (*)    Leukocytes, UA TRACE (*)    All other components within normal limits  URINALYSIS, MICROSCOPIC (REFLEX) - Abnormal; Notable for the following components:   Bacteria, UA FEW (*)    All other components within normal limits  URINE CULTURE  WET PREP, GENITAL  LIPASE, BLOOD  I-STAT BETA HCG BLOOD, ED ([facility], [facility], [facility] ONLY)  GC/CHLAMYDIA PROBE AMP ([place]) NOT AT [facility]    EKG None  Radiology Ct Renal Stone Study  Result Date: 11/12/2017 CLINICAL DATA:  Pyelonephritis.  Right-sided back pain dysuria EXAM: CT ABDOMEN AND PELVIS WITHOUT CONTRAST TECHNIQUE: Multidetector CT imaging of the abdomen and pelvis was performed following the standard protocol without IV contrast. COMPARISON:  CT abdomen pelvis 12/12/2013 FINDINGS: Lower chest: Lung bases clear bilaterally.  Heart size normal. Hepatobiliary: Postop cholecystectomy. No biliary dilatation. Fatty infiltration of the liver. Pancreas: Negative Spleen: Negative Adrenals/Urinary Tract: Normal kidneys. No obstruction mass or calculi. Urinary bladder normal.  Stomach/Bowel: Negative for bowel obstruction. Negative for bowel mass or edema. Normal appendix. Negative for diverticulitis. Vascular/Lymphatic: Negative Reproductive: Negative for pelvic mass. Normal uterus. Bilateral fallopian tube clips. Other: No free fluid. Musculoskeletal: Negative IMPRESSION: No acute abnormality.  Negative for renal obstruction or stone. Normal appendix Electronically Signed   By: Franchot Gallo M.D.   On: 11/12/2017 14:12  Procedures Procedures (including critical care time)  Medications Ordered in ED Medications  cephALEXin (KEFLEX) capsule 500 mg (has no administration in time range)  gi cocktail (Maalox,Lidocaine,Donnatal) (30 mLs Oral Given 11/12/17 1304)  famotidine (PEPCID) tablet 20 mg (20 mg Oral Given 11/12/17 1304)  phenazopyridine (PYRIDIUM) tablet 100 mg (100 mg Oral Given 11/12/17 1304)     Initial Impression / Assessment and Plan / ED Course  I have reviewed the triage vital signs and the nursing notes.  Pertinent labs & imaging results that were available during my care of the patient were reviewed by me and considered in my medical decision making (see chart for details).  42 year old non-pregnant female presents with dysuria, abdominal pain/flank pain. History is somewhat limited by language barrier as well as the course of her symptoms is unusual. Her vitals are normal. On exam she has R CVA tenderness but also has significant epigastric tenderness. Her pelvic is unremarkable other than the exam was uncomfortable for her. She states it is always like this and since she had a recent normal STD screen - doubt PID. Her CBC is normal. CMP is remarkable for hyperglycemia (293), elevated AP (128), elevated AST/ALT (59/56), elevated bilirubin (1.3). She denies significant ETOH use but does take a lot of Tylenol. She does have evidence of fatty liver on exam as well. UA is consistent with UTI. Urine culture was sent. Wet prep is normal. She was given pepcid, GI  cocktail for her abdominal pain. She will be treated for pyelonephritis again with Keflex and was advised to closely follow with her PCP regarding her elevated liver enzymes. She verbalized understanding and has an appointment at the end of the month.  Final Clinical Impressions(s) / ED Diagnoses   Final diagnoses:  Acute cystitis with hematuria  Elevated transaminase level  Chronic pelvic pain in female    ED Discharge Orders    None       Recardo Evangelist, PA-C 11/12/17 1635    Mesner, Corene Cornea, MD 11/12/17 [phone]

## 2017-11-12 NOTE — Discharge Instructions (Addendum)
Take Keflex three times a day for 10 days for possible kidney infection Take Omeprazole once daily for stomach pain Take tramadol as needed for moderate-severe pain Please follow up with your doctor

## 2017-11-12 NOTE — ED Triage Notes (Signed)
Pt reports R sided back pain, lower abd pain, dysuria X few days. Seen approx 1 mo ago for same an dx with Pyelonephritis.

## 2017-11-12 NOTE — ED Notes (Signed)
Pt unable to provide enough urine for a culture. 

## 2017-11-12 NOTE — ED Notes (Signed)
C/o pain and burning with urination onset yest. States she was treated for UTI last month and completed her medication and now it is back.

## 2017-11-14 LAB — GC/CHLAMYDIA PROBE AMP (~~LOC~~) NOT AT ARMC
Chlamydia: NEGATIVE
NEISSERIA GONORRHEA: NEGATIVE

## 2017-11-14 LAB — URINE CULTURE: Culture: >=100000 — AB

## 2017-11-15 ENCOUNTER — Telehealth: Payer: Self-pay | Admitting: Emergency Medicine

## 2017-11-15 NOTE — Telephone Encounter (Signed)
Post ED Visit - Positive Culture Follow-up  Culture report reviewed by antimicrobial stewardship pharmacist:  []  Elenor Quinones, Pharm.D. []  Heide Guile, Pharm.D., BCPS AQ-ID []  Parks Neptune, Pharm.D., BCPS []  Alycia Rossetti, Pharm.D., BCPS []  [place], Pharm.D., BCPS, AAHIVP []  Legrand Como, Pharm.D., BCPS, AAHIVP []  Salome Arnt, PharmD, BCPS []  Johnnette Gourd, PharmD, BCPS []  Hughes Better, PharmD, BCPS []  Leeroy Cha, PharmD  Positive urine culture Treated with cephalexin, organism sensitive to the same and no further patient follow-up is required at this time.  Hazle Nordmann 11/15/2017, 11:27 AM

## 2017-12-05 ENCOUNTER — Ambulatory Visit: Payer: Self-pay | Admitting: Family Medicine

## 2017-12-07 ENCOUNTER — Encounter: Payer: Self-pay | Admitting: Family Medicine

## 2017-12-07 ENCOUNTER — Ambulatory Visit (INDEPENDENT_AMBULATORY_CARE_PROVIDER_SITE_OTHER): Payer: Self-pay | Admitting: Family Medicine

## 2017-12-07 VITALS — BP 110/64 | HR 76 | Temp 98.3°F | Ht 59.0 in | Wt 222.4 lb

## 2017-12-07 DIAGNOSIS — E1165 Type 2 diabetes mellitus with hyperglycemia: Secondary | ICD-10-CM

## 2017-12-07 DIAGNOSIS — E1149 Type 2 diabetes mellitus with other diabetic neurological complication: Secondary | ICD-10-CM

## 2017-12-07 DIAGNOSIS — K760 Fatty (change of) liver, not elsewhere classified: Secondary | ICD-10-CM

## 2017-12-07 DIAGNOSIS — R300 Dysuria: Secondary | ICD-10-CM

## 2017-12-07 DIAGNOSIS — Z794 Long term (current) use of insulin: Secondary | ICD-10-CM

## 2017-12-07 DIAGNOSIS — I10 Essential (primary) hypertension: Secondary | ICD-10-CM

## 2017-12-07 LAB — POCT GLYCOSYLATED HEMOGLOBIN (HGB A1C): HbA1c, POC (controlled diabetic range): 10.7 % — AB (ref 0.0–7.0)

## 2017-12-07 MED ORDER — AMITRIPTYLINE HCL 25 MG PO TABS: 25.0000 mg | ORAL_TABLET | Freq: Every day | ORAL | 1 refills | Status: DC

## 2017-12-07 NOTE — Patient Instructions (Signed)
Thank you for coming to see me today. It was a pleasure! Today we talked about:   Your diabetes. Please start using 12 units of NPH in the morning and continue your 10 units at night. If you check your sugar and it is above 180, please increase your NPH by 1U each time. Only take up to 15 U in the am. I have scheduled you a follow up appointment for a longer visit on November 11 to help with your diabetes.  Please continue your metformin.   I have sent amitriptyline to your pharmacy to help with your leg pain. Please take it at night before bedtime.   We will call you with your urine results.   Please follow-up with me on November 11 or sooner as needed.  If you have any questions or concerns, please do not hesitate to call the office at [phone].  Take Care,   [place] Rainna Nearhood, DO

## 2017-12-07 NOTE — Progress Notes (Signed)
Subjective:    Patient ID: [name], female    DOB: [birth year], 42 y.o.   MRN: [id number]   CC: follow up  HPI:  Diabetes:  Last A1c 9.9 on 08/15/17 Taking medications: metformin 1,031m BID, 10 U NPH BID; lowest CBG 131, highest 413> checking CBG most days Patient not taking Aspirin, and on statin Last eye exam: 10/12/2017 Last foot exam:  Performed today ROS: denies dizziness, diaphoresis, LOC, polyuria, polydipsia  Dysuria: Pain urinating started many days ago, never quite resolved from previous. Patient reports urgency, no blood in urine and increased frequency. She has tried nohting for this, completed keflex from 09/01 ED visit.  No more than 3 UTIs in the last 12 months.  Patient does not believe she is pregnant.  ROS: Patient reports no CVA tenderness, no fevers.   Fatty Liver: Patient diagnosed with fatty liver in ED on 09/01. Patient Hep a and b-  Reports she previously had this but she thought that it had resolved.  Patient notes recent weight gain.  Patient reports that she has also recently been nauseous.  Not currently on any treatments that would affect her liver.  Patient is very concerned with recent CT scan that showed that she has fatty liver again.    Smoking status reviewed  ROS: 10 point ROS is otherwise negative, except as mentioned in HPI  Patient Active Problem List   Diagnosis Date Noted  . Dyspnea on exertion 08/22/2017  . Onychomycosis 10/26/2016  . Uncontrolled type 2 diabetes mellitus with hyperglycemia, with long-term [place] use of insulin [place] 10/21/2016  . Healthcare maintenance 06/16/2015  . Diabetic neuropathy ([place] 04/25/2014  . GI bleed 03/19/2014  . Blood in stool 11/08/2013  . Anemia 12/21/2012  . Numbness of face 08/21/2012  . Seasonal allergies 07/26/2012  . Dysuria 07/26/2012  . Back pain 04/20/2012  . Hepatic steatosis 10/01/2011  . Depression 05/26/2011  . Hypertension 05/26/2011  . GERD 09/17/2008  . Morbid  obesity ([place] 07/23/2008     Objective:  BP 110/64 (BP Location: Right Arm, Patient Position: Sitting, Cuff Size: Large)   Pulse 76   Temp 98.3 F (36.8 C) (Oral)   Ht 4' 11"  (1.499 m)   Wt 222 lb 6.4 oz (100.9 kg)   SpO2 99%   BMI 44.92 kg/m  Vitals and nursing note reviewed  General: NAD, pleasant Cardiac: RRR, normal heart sounds, no murmurs Respiratory: CTAB, normal effort Abdomen: soft, nontender, nondistended Extremities: no edema or cyanosis. WWP. Skin: warm and dry, no rashes noted Neuro: alert and oriented, no focal deficits Psych: normal affect  Diabetic Foot Exam - Simple   Simple Foot Form Diabetic Foot exam was performed with the following findings:  Yes 12/07/2017 12:13 PM  Visual Inspection No deformities, no ulcerations, no other skin breakdown bilaterally:  Yes See comments:  Yes Sensation Testing See comments:  Yes Pulse Check Posterior Tibialis and Dorsalis pulse intact bilaterally:  Yes Comments Patient with onychomycosis of bilateral great toes. Sensation intact to touch, did not perform monofilament testing     Assessment & Plan:   Diabetic neuropathy Patient with history of diabetic neuropathy.  Reports that she has previously tried gabapentin and per notes has also tried nortriptyline.  Patient does not recall what helped her.  On chart review it seems that amitriptyline has previously been successful for patient.  She reports that she has been trying to walk daily but it is harder given her pain in her legs.  Will trial  amitriptyline 25 mg daily at bedtime.  Hopefully this will encourage patient to exercise more so that she can lose weight in order to help with her fatty liver and diabetes.  Bilateral foot exam performed today for diabetes with sensation intact bilaterally and palpable pulses bilaterally.  See simple foot exam for more details.  Uncontrolled type 2 diabetes mellitus with hyperglycemia, with long-term current use of insulin  ([place]) Patient started on NPH 10 units in the morning and at night.  Patient reports compliance with her medications.  A1c 10.7 in office today.  On review of patient's glucose monitor, she is checking most every day once daily.  Patient instructed to increase to 12 units of NPH in the morning and continue 10 units at night.  If CBG >180, instructed to increase NPH by 1U,  up to 15 U in the am.  ASCVD risk 1.1% and patient not taking aspirin or statin at this time.  Previously on both medications but unable to afford them. She is currently in the process of trying to obtain an orange card.  Would prefer patient to be on SGL T2 or GLP-1 however unable to afford at this time.  Patient scheduled for TOPc clinic given multiple complaints each visit with difficult to control diabetes.  Appointment scheduled for 11/11.  Patient voiced understanding and appreciates having more time to spend with her in the room to discuss her many problems.  Patient able to read back plan for NPH with CMA.  Hypertension Patient with history of hypertension per chart review.  Patient has not been on medications for many years.  BP stable at 110/64 today.  Dysuria Patient left urine sample in office today for dysuria.  However sample unable to be tested at this time.  Will call patient and inform her of inability to test her urine.  Patient often with symptoms related to uncontrolled glucose.  Recently treated for E. coli UTI with Keflex, found to be susceptible on culture on 9/0 1.  Patient will be instructed to leave another urine sample at the office for [place] in order to ensure UTI has been treated.  Hepatic steatosis Patient with hepatic steatosis for many years but was recently seen again on CT abdomen.  Patient thought that she no longer had this diagnosis.  Reassured patient and explained physiology behind fatty liver disease.  Patient also with recent weight gain.  Encouraged weight loss and control of diabetes.  Patient  with recent CMP on 9/01 which showed: AST 59, ALT 56, Alk phos 18, Total Bili 1.3.  Patient has an appointment with GI on 10/09.   Of note, patient also was to have repeat colonoscopy 1 month after patient had EGD/colonoscopy on 04/07/2014 given that she did not adhere to clear liquid diet prior to procedure.   [place] Shaquel Chavous, DO Family Medicine Resident PGY-2

## 2017-12-13 NOTE — Assessment & Plan Note (Signed)
Patient with hepatic steatosis for many years but was recently seen again on CT abdomen.  Patient thought that she no longer had this diagnosis.  Reassured patient and explained physiology behind fatty liver disease.  Patient also with recent weight gain.  Encouraged weight loss and control of diabetes.  Patient with recent CMP on 9/01 which showed: AST 59, ALT 56, Alk phos 18, Total Bili 1.3.  Patient has an appointment with GI on 10/09.   Of note, patient also was to have repeat colonoscopy 1 month after patient had EGD/colonoscopy on 04/07/2014 given that she did not adhere to clear liquid diet prior to procedure.

## 2017-12-13 NOTE — Assessment & Plan Note (Signed)
Patient with history of hypertension per chart review.  Patient has not been on medications for many years.  BP stable at 110/64 today.

## 2017-12-13 NOTE — Assessment & Plan Note (Addendum)
Patient started on NPH 10 units in the morning and at night.  Patient reports compliance with her medications.  A1c 10.7 in office today.  On review of patient's glucose monitor, she is checking most every day once daily.  Patient instructed to increase to 12 units of NPH in the morning and continue 10 units at night.  If CBG >180, instructed to increase NPH by 1U,  up to 15 U in the am.  ASCVD risk 1.1% and patient not taking aspirin or statin at this time.  Previously on both medications but unable to afford them. She is currently in the process of trying to obtain an orange card.  Would prefer patient to be on SGL T2 or GLP-1 however unable to afford at this time.  Patient scheduled for TOPc clinic given multiple complaints each visit with difficult to control diabetes.  Appointment scheduled for 11/11.  Patient voiced understanding and appreciates having more time to spend with her in the room to discuss her many problems.  Patient able to read back plan for NPH with CMA.

## 2017-12-13 NOTE — Assessment & Plan Note (Signed)
Patient with history of diabetic neuropathy.  Reports that she has previously tried gabapentin and per notes has also tried nortriptyline.  Patient does not recall what helped her.  On chart review it seems that amitriptyline has previously been successful for patient.  She reports that she has been trying to walk daily but it is harder given her pain in her legs.  Will trial amitriptyline 25 mg daily at bedtime.  Hopefully this will encourage patient to exercise more so that she can lose weight in order to help with her fatty liver and diabetes.  Bilateral foot exam performed today for diabetes with sensation intact bilaterally and palpable pulses bilaterally.  See simple foot exam for more details.

## 2017-12-13 NOTE — Assessment & Plan Note (Signed)
Patient left urine sample in office today for dysuria.  However sample unable to be tested at this time.  Will call patient and inform her of inability to test her urine.  Patient often with symptoms related to uncontrolled glucose.  Recently treated for E. coli UTI with Keflex, found to be susceptible on culture on 9/0 1.  Patient will be instructed to leave another urine sample at the office for [place] in order to ensure UTI has been treated.

## 2017-12-20 ENCOUNTER — Encounter: Payer: Self-pay | Admitting: Gastroenterology

## 2017-12-20 ENCOUNTER — Other Ambulatory Visit (INDEPENDENT_AMBULATORY_CARE_PROVIDER_SITE_OTHER): Payer: Self-pay

## 2017-12-20 ENCOUNTER — Ambulatory Visit: Payer: Self-pay | Admitting: Gastroenterology

## 2017-12-20 VITALS — BP 122/80 | HR 76 | Ht 58.25 in | Wt 216.5 lb

## 2017-12-20 DIAGNOSIS — K219 Gastro-esophageal reflux disease without esophagitis: Secondary | ICD-10-CM

## 2017-12-20 DIAGNOSIS — R1013 Epigastric pain: Secondary | ICD-10-CM

## 2017-12-20 DIAGNOSIS — R1030 Lower abdominal pain, unspecified: Secondary | ICD-10-CM

## 2017-12-20 DIAGNOSIS — K602 Anal fissure, unspecified: Secondary | ICD-10-CM

## 2017-12-20 DIAGNOSIS — Z8619 Personal history of other infectious and parasitic diseases: Secondary | ICD-10-CM

## 2017-12-20 DIAGNOSIS — R748 Abnormal levels of other serum enzymes: Secondary | ICD-10-CM

## 2017-12-20 LAB — HEPATIC FUNCTION PANEL
ALBUMIN: 4.1 g/dL (ref 3.5–5.2)
ALT: 58 U/L — ABNORMAL HIGH (ref 0–35)
AST: 36 U/L (ref 0–37)
Alkaline Phosphatase: 140 U/L — ABNORMAL HIGH (ref 39–117)
Bilirubin, Direct: 0.1 mg/dL (ref 0.0–0.3)
Total Bilirubin: 0.6 mg/dL (ref 0.2–1.2)
Total Protein: 7.7 g/dL (ref 6.0–8.3)

## 2017-12-20 MED ORDER — DICYCLOMINE HCL 10 MG PO CAPS: 10.0000 mg | ORAL_CAPSULE | Freq: Three times a day (TID) | ORAL | 2 refills | Status: DC | PRN

## 2017-12-20 MED ORDER — AMBULATORY NON FORMULARY MEDICATION: 0 refills | Status: DC

## 2017-12-20 MED ORDER — PANTOPRAZOLE SODIUM 40 MG PO TBEC: 40.0000 mg | DELAYED_RELEASE_TABLET | Freq: Every day | ORAL | 1 refills | Status: DC

## 2017-12-20 NOTE — Patient Instructions (Signed)
If you are age 43 or older, your body mass index should be between 23-30. Your Body mass index is 44.86 kg/m. If this is out of the aforementioned range listed, please consider follow up with your Primary Care Provider.  If you are age 76 or younger, your body mass index should be between 19-25. Your Body mass index is 44.86 kg/m. If this is out of the aformentioned range listed, please consider follow up with your Primary Care Provider.   Please go to the lab in the basement of our building to have lab work done as you leave today.  We have sent the following medications to your pharmacy for you to pick up at your convenience: Protonix 40 mg: take once a day.  Do not start until you have submitted your stool sample for the H. Pylori test.  Bentyl 10 mg: Take 1 to 2 tablets every 8 hours as needed   We have sent a prescription for nitroglycerin 0.125% gel to [facility]. You should apply a pea size amount to your rectum three times daily x 6-8 weeks.  [facility]'s information is below: Address: [address], [place], [place] [address]  [phone]  *Please DO NOT go directly from our office to pick up this medication! Give the pharmacy 1 day to process the prescription as this is compounded at takes time to make.  Call us in 2 weeks if you are not improved.  Thank you for entrusting me with your care and for choosing [facility], Dr. [place] Cellar

## 2017-12-20 NOTE — Progress Notes (Signed)
HPI :  42 year old Spanish-speaking female with a history of fatty liver, H. Pylori, chronic abdominal pain, referred here by Shirley, [place], DO for further evaluation of abdominal pain and rectal bleeding. History obtained using translator services  She reports epigastric pain and lower abdominal pain ongoing for the past 15 years. She states she has pain in her upper abdomen and lower abdomen most of the time, she is rarely pain-free. She reports that her pain to usually bother her at the same time, both upper and lower. She endorses it as a burning sensation. Sometimes eating can make her epigastric pain worse, sometimes she has associated poor appetite with associated nausea, and rare vomiting. She feels that having a bowel movement can sometimes make her abdomen better. She has roughly 2-3 bowel movements per day, sometimes loose or soft, sometimes constipated. She has noted some blood in her stools recently along with some rectal discomfort when passing stools. She feels this discomfort as a sharp discomfort in her rectum with a bowel movement. She has been taking Tylenol for the pain more recently. Previously she was taking ibuprofen and Mobic for her pain which did not help. She reports she was given a trial of omeprazole which not help her pain. She's had history of GERD and reflux which she states continues to bother her, times does provide some benefit for this.  She's had a history of an EGD and colonoscopy for the symptoms in 2016 with Dr. Oneida Alar. She tested positive for was treated for H. Pylori. I do not see any follow-up eradication testing. Her colonoscopy revealed internal hemorrhoids as the likely cause of her rectal bleeding at the time, although exam was somewhat limited by poor prep, no other pathology was noted other than a diminutive rectal hyperplastic polyp.   She had a CT scan of her abdomen on September 1 which did not show any cause for her pain. She was noted to have fatty  liver which she is aware of. She's had her gallbladder removed in the past. She denies any alcohol use currently or history of heavy alcohol use in the past. She was noted to have elevated liver enzymes as outlined below when seen in the ER recently. Is the first time she's had elevated liver enzymes from what I can gather.  Colonoscopy 04/07/2014 - normal TI, poor bowel prep with redundant colon, 92m rectal polyp - hyperplastic EGD 04/07/2014 - normal esophagus, gastritis / duodenitis   Past Medical History:  Diagnosis Date  . Chronic chest pain   . Chronic pelvic pain in female   . DDD (degenerative disc disease), cervical   . Depression   . Diabetes mellitus   . Dizziness   . Fatty liver   . GERD (gastroesophageal reflux disease)   . H/O echocardiogram 09/2013   normal  . Headache   . Hypertension   . Peripheral neuropathy   . Renal disorder      Past Surgical History:  Procedure Laterality Date  . CESAREAN SECTION     x 3  . CHOLECYSTECTOMY N/A 04/10/2015   Procedure: LAPAROSCOPIC CHOLECYSTECTOMY ;  Surgeon: DCoralie Keens MD;  [facility]  Service: General;  Laterality: N/A;  . COLONOSCOPY N/A 04/07/2014   SWUX:LKGMWHH/EPIGASTRIC PAIN/SMALL INTERNAL HEMORRHOIDS/MILD DIVERTICULOSIS IN LEFT COLON  . ESOPHAGOGASTRODUODENOSCOPY N/A 04/07/2014   SNUU:VOZDGHH/EPIGASTRIC PAIN/SMALL INTERNAL HEMORRHOIDS/MILD DIVERTICULOSIS IN LEFT COLON  . ROTATOR CUFF REPAIR Right   . TUBAL LIGATION     Family History  Problem Relation  Age of Onset  . Hypertension Mother   . Diabetes Father   . Hypertension Sister   . Diabetes Sister   . Diabetes Daughter   . Pancreatitis Daughter   . Migraines Daughter   . Asthma Daughter   . Colon cancer Neg Hx    Social History   Tobacco Use  . Smoking status: Never Smoker  . Smokeless tobacco: Never Used  Substance Use Topics  . Alcohol use: No  . Drug use: No   Current Outpatient Medications  Medication Sig Dispense Refill  .  amitriptyline (ELAVIL) 25 MG tablet Take 1 tablet (25 mg total) by mouth at bedtime. 30 tablet 1  . blood glucose meter kit and supplies KIT Dispense based on patient and insurance preference. Use up to four times daily as directed. (FOR ICD-9 250.00, 250.01). 1 each 0  . glucose blood test strip Use as instructed 100 each 12  . ibuprofen (ADVIL,MOTRIN) 200 MG tablet Take 400 mg by mouth every 6 (six) hours as needed.    . insulin NPH Human (HUMULIN N,NOVOLIN N) 100 UNIT/ML injection Inject 0.1 mLs (10 Units total) into the skin 2 (two) times daily before a meal. 10 mL 11  . metFORMIN (GLUCOPHAGE XR) 500 MG 24 hr tablet Take 2 tablets (1,000 mg total) by mouth 2 (two) times daily with a meal. 120 tablet 4  . traMADol (ULTRAM) 50 MG tablet Take 1 tablet (50 mg total) by mouth every 6 (six) hours as needed. 15 tablet 0   No current facility-administered medications for this visit.    No Known Allergies   Review of Systems: All systems reviewed and negative except where noted in HPI.   Lab Results  Component Value Date   WBC 9.0 11/12/2017   HGB 14.1 11/12/2017   HCT 43.2 11/12/2017   MCV 82.4 11/12/2017   PLT 232 11/12/2017    Lab Results  Component Value Date   CREATININE 0.50 11/12/2017   BUN 6 11/12/2017   NA 134 (L) 11/12/2017   K 4.4 11/12/2017   CL 98 11/12/2017   CO2 29 11/12/2017    Lab Results  Component Value Date   ALT 56 (H) 11/12/2017   AST 59 (H) 11/12/2017   ALKPHOS 128 (H) 11/12/2017   BILITOT 1.3 (H) 11/12/2017     Physical Exam: BP 122/80 (BP Location: Left Arm, Patient Position: Sitting, Cuff Size: Normal)   Pulse 76   Ht 4' 10.25" (1.48 m) Comment: height measured without shoes  Wt 216 lb 8 oz (98.2 kg)   LMP 11/26/2017   BMI 44.86 kg/m  Constitutional: Pleasant,well-developed, female in no acute distress. HEENT: Normocephalic and atraumatic. Conjunctivae are normal. No scleral icterus. Neck supple.  Cardiovascular: Normal rate, regular rhythm.   Pulmonary/chest: Effort normal and breath sounds normal. No wheezing, rales or rhonchi. Abdominal: Soft, protuberant, diffusely tender to mild palpation in the upper and lower abdomen. There are no masses palpable. No hepatomegaly. DRE - standby Tia Alert CMA - posterior canal anal fissure, external skin tags, small internal hemorrhoids, no mass lesions Extremities: no edema Lymphadenopathy: No cervical adenopathy noted. Neurological: Alert and oriented to person place and time. Skin: Skin is warm and dry. No rashes noted. Psychiatric: Normal mood and affect. Behavior is normal.   ASSESSMENT AND PLAN: 42 year old female here for new patient assessment of multiple issues as outlined below:  Epigastric pain / GERD / history of H. Pylori - she has a history of cholecystectomy. Remotely treated for  H. Pylori gastritis however she never had follow-up eradication testing. She's been treated with omeprazole in the past, unsure what dose she was on a time, this did not help her reflux symptoms. Her prior upper endoscopy for these symptoms did not show any other pathology other than H. Pylori. She warrants follow-up testing to ensure no evidence of active H. Pylori. I discussed options with her, we'll begin with an H. Pylori stool antigen. Once that has been submitted, we will try her on Protonix 40 mg once daily and can increase to twice daily as needed if no relief. Hopefully this will help her reflux symptoms, we'll see if it helps her epigastric pain. If H. Pylori negative and no benefit from changing PPI, will consider upper endoscopy. I asked her to call me in a few weeks for reassessment.  Anal fissure - this clearly reproduced her symptoms and I suspect is a very likely cause of her bleeding symptoms. Recommend using topical nitroglycerin 0.125% 3 times daily which should help this. She may also want to try daily fiber supplement to regularize her bowels. Prior colonoscopy for rectal bleeding 3 years  ago did not reveal any significant pathology, however somewhat limited by prep. If her symptoms persist despite therapy of the fissure will recommend colonoscopy. As above, she will contact me in a few weeks to update [place] on her status.  Lower abdominal pain - no clear pathology noted on CT imaging. Chronic and ongoing for 15 years. Possible she has component of irritable bowel syndrome, versus abdominal wall pain. We'll try her on some Bentyl to use as needed see if this helps.  Elevated liver enzymes - recent elevation noted when seen in the ER. We'll repeat this to see if it's persistent, and send hepatitis B and hepatitis C serologies to ensure negative. While this may be due to fatty liver noted on imaging, will need further serologic evaluation if the liver enzyme elevation persists to ensure no other contributing cause. She agreed with the plan.   Lazy Y U Cellar, MD [facility]  CC: Shirley, [place], DO

## 2017-12-21 LAB — HELICOBACTER PYLORI  SPECIAL ANTIGEN
MICRO NUMBER:: 91214534
SPECIMEN QUALITY: ADEQUATE

## 2017-12-21 LAB — HEPATITIS B SURFACE ANTIGEN: Hepatitis B Surface Ag: NONREACTIVE

## 2017-12-21 LAB — HEPATITIS C ANTIBODY
Hepatitis C Ab: NONREACTIVE
SIGNAL TO CUT-OFF: 0.02 (ref <1.00)

## 2017-12-22 ENCOUNTER — Other Ambulatory Visit (INDEPENDENT_AMBULATORY_CARE_PROVIDER_SITE_OTHER): Payer: Self-pay

## 2017-12-22 ENCOUNTER — Other Ambulatory Visit: Payer: Self-pay

## 2017-12-22 DIAGNOSIS — R1013 Epigastric pain: Secondary | ICD-10-CM

## 2017-12-22 DIAGNOSIS — R748 Abnormal levels of other serum enzymes: Secondary | ICD-10-CM

## 2017-12-22 NOTE — Addendum Note (Signed)
Addended by: Isaiah Serge D on: 12/22/2017 04:48 PM   Modules accepted: Orders

## 2017-12-23 LAB — IRON AND TIBC
IRON SATURATION: 15 % (ref 15–55)
Iron: 48 ug/dL (ref 27–159)
TIBC: 313 ug/dL (ref 250–450)
UIBC: 265 ug/dL (ref 131–425)

## 2017-12-25 LAB — FERRITIN: FERRITIN: 78.9 ng/mL (ref 10.0–291.0)

## 2017-12-26 LAB — ALPHA-1-ANTITRYPSIN: A-1 Antitrypsin, Ser: 136 mg/dL (ref 83–199)

## 2017-12-26 LAB — ANTI-SMOOTH MUSCLE ANTIBODY, IGG

## 2017-12-26 LAB — ANA: Anti Nuclear Antibody(ANA): POSITIVE — AB

## 2017-12-26 LAB — ANTI-NUCLEAR AB-TITER (ANA TITER)
ANA TITER: 1:320 {titer} — ABNORMAL HIGH
ANA Titer 1: 1:40 {titer} — ABNORMAL HIGH

## 2017-12-26 LAB — CERULOPLASMIN: Ceruloplasmin: 44 mg/dL (ref 18–53)

## 2017-12-27 ENCOUNTER — Other Ambulatory Visit: Payer: Self-pay

## 2017-12-27 DIAGNOSIS — R748 Abnormal levels of other serum enzymes: Secondary | ICD-10-CM

## 2018-01-01 ENCOUNTER — Other Ambulatory Visit: Payer: Self-pay

## 2018-01-01 DIAGNOSIS — R748 Abnormal levels of other serum enzymes: Secondary | ICD-10-CM

## 2018-01-03 LAB — MITOCHONDRIAL ANTIBODIES: Mitochondrial M2 Ab, IgG: <=20 U

## 2018-01-03 LAB — IGG: IgG (Immunoglobin G), Serum: 1163 mg/dL (ref 600–1640)

## 2018-01-03 LAB — ANTI-MICROSOMAL ANTIBODY LIVER / KIDNEY: LKM1 Ab: <=20 U (ref <=20.0)

## 2018-01-04 ENCOUNTER — Other Ambulatory Visit: Payer: Self-pay

## 2018-01-04 DIAGNOSIS — K760 Fatty (change of) liver, not elsewhere classified: Secondary | ICD-10-CM

## 2018-01-04 DIAGNOSIS — R748 Abnormal levels of other serum enzymes: Secondary | ICD-10-CM

## 2018-01-04 NOTE — Progress Notes (Unsigned)
Repeat LFTs in 6 months = April 2020.

## 2018-01-22 ENCOUNTER — Ambulatory Visit (INDEPENDENT_AMBULATORY_CARE_PROVIDER_SITE_OTHER): Payer: Self-pay | Admitting: Family Medicine

## 2018-01-22 VITALS — BP 112/80 | HR 76 | Temp 98.1°F | Ht 59.0 in | Wt 216.2 lb

## 2018-01-22 DIAGNOSIS — R300 Dysuria: Secondary | ICD-10-CM

## 2018-01-22 DIAGNOSIS — E1165 Type 2 diabetes mellitus with hyperglycemia: Secondary | ICD-10-CM

## 2018-01-22 DIAGNOSIS — Z794 Long term (current) use of insulin: Secondary | ICD-10-CM

## 2018-01-22 LAB — POCT URINALYSIS DIP (MANUAL ENTRY)
Bilirubin, UA: NEGATIVE
Glucose, UA: 500 mg/dL — AB
LEUKOCYTES UA: NEGATIVE
NITRITE UA: NEGATIVE
PH UA: 5.5 (ref 5.0–8.0)
Spec Grav, UA: >=1.03 — AB (ref 1.010–1.025)
Urobilinogen, UA: 0.2 E.U./dL

## 2018-01-22 LAB — POCT GLYCOSYLATED HEMOGLOBIN (HGB A1C): HBA1C, POC (CONTROLLED DIABETIC RANGE): 11.7 % — AB (ref 0.0–7.0)

## 2018-01-22 MED ORDER — GLIPIZIDE 5 MG PO TABS: 5.0000 mg | ORAL_TABLET | Freq: Every day | ORAL | 3 refills | Status: DC

## 2018-01-22 MED ORDER — METFORMIN HCL ER 500 MG PO TB24: 1000.0000 mg | ORAL_TABLET | Freq: Two times a day (BID) | ORAL | 4 refills | Status: DC

## 2018-01-22 NOTE — Patient Instructions (Addendum)
Thank you for coming to see me today. It was a pleasure! Today we talked about:   Three things to do to improve your diabetes: 1. Walk 3-5 days out of the week. Ask your friend to walk to downtown with you which is an hour each way.  2. Talk to your friend more often to help motivate you to make better choices and get outside to walk 3. Be a good example for your daughter.  Also, Start taking glipizide once per day.   Please schedule an appointment to follow up in 3 months.   If you have any questions or concerns, please do not hesitate to call the office at [phone].  Take Care,   [place] Kirston Luty, DO    Diet Recommendations for Diabetes   Starchy (carb) foods: Bread, rice, pasta, potatoes, corn, cereal, grits, crackers, bagels, muffins, all baked goods.  (Fruits, milk, and yogurt also have carbohydrate, but most of these foods will not spike your blood sugar as most starchy foods will.)  A few fruits do cause high blood sugars; use small portions of bananas (limit to 1/2 at a time), grapes, watermelon, oranges, and most tropical fruits.    Protein foods: Meat, fish, poultry, eggs, dairy foods, and beans such as pinto and kidney beans (beans also provide carbohydrate).   1. Eat at least 3 meals and 1-2 snacks per day. Never go more than 4-5 hours while awake without eating. Eat breakfast within the first hour of getting up.   2. Limit starchy foods to TWO per meal and ONE per snack. ONE portion of a starchy  food is equal to the following:   - ONE slice of bread (or its equivalent, such as half of a hamburger bun).   - 1/2 cup of a "scoopable" starchy food such as potatoes or rice.   - 15 grams of Total Carbohydrate as shown on food label.  3. Include at every meal: a protein food, a carb food, and vegetables and/or fruit.   - Obtain twice the volume of veg's as protein or carbohydrate foods for both lunch and dinner.   - Fresh or frozen veg's are best.   - Keep frozen veg's on  hand for a quick vegetable serving.

## 2018-01-22 NOTE — Progress Notes (Signed)
  Subjective:    Patient ID: [name], female    DOB: April 29, 1975, 42 y.o.   MRN: [id number]   CC: f/u dm  HPI: Diabetes:  Last A1c 10.7 on 9/26 Taking medications: metformin 1000 mg BID, NPH 10 U in am and pm Last eye exam: recently Last foot exam: up to date ROS: denies dizziness, diaphoresis, LOC, polyuria, polydipsia Has not been checking her CBG's because she lost her meter, when A1c was better controlled, patient reported walking and cooking better but her friend left that she walked with  Dysuria: Patient with burning on urination, denies any frequency or urgency, some incontinence and No fevers, some chills. Pain with wiping.   Smoking status reviewed  ROS: 10 point ROS is otherwise negative, except as mentioned in HPI  Patient Active Problem List   Diagnosis Date Noted  . Dyspnea on exertion 08/22/2017  . Onychomycosis 10/26/2016  . Uncontrolled type 2 diabetes mellitus with hyperglycemia, with long-term current use of insulin ([place]) 10/21/2016  . Healthcare maintenance 06/16/2015  . Diabetic neuropathy ([place]) 04/25/2014  . GI bleed 03/19/2014  . Blood in stool 11/08/2013  . Anemia 12/21/2012  . Numbness of face 08/21/2012  . Seasonal allergies 07/26/2012  . Dysuria 07/26/2012  . Back pain 04/20/2012  . Hepatic steatosis 10/01/2011  . Depression 05/26/2011  . Hypertension 05/26/2011  . GERD 09/17/2008  . Morbid obesity ([place]) 07/23/2008     Objective:  BP 112/80 (BP Location: Left Arm, Patient Position: Sitting, Cuff Size: Large)   Pulse 76   Temp 98.1 F (36.7 C) (Oral)   Ht 4\' 11"  (1.499 m)   Wt 216 lb 3.2 oz (98.1 kg)   SpO2 98%   BMI 43.67 kg/m  Vitals and nursing note reviewed  General: NAD, pleasant Respiratory: normal effort Extremities: no edema or cyanosis. WWP. Skin: warm and dry, no rashes noted Neuro: alert and oriented, no focal deficits Psych: normal affect  Assessment & Plan:    Uncontrolled type 2 diabetes mellitus with  hyperglycemia, with long-term current use of insulin ([place]) Long discussion with patient regarding uncontrolled dm. Patient previously with A1c to 8 and notes that her lifestyle was much different. See goals in AVS.   Will start glipizide 5mg  once daily today as patient does not want to increase insulin as she believes that is makes her nauseas and dizzy.   Protein/Cr ratio of 360 noted in office today, will start low dose ACEi.   Dysuria No signs of UTI, likely due to poorly controlled diabetes and now with protein/cr ration 360, will start lisinopril 2.5 mg daily      [place] Koda Routon, DO Family Medicine Resident PGY-2

## 2018-01-23 LAB — PROTEIN / CREATININE RATIO, URINE
Creatinine, Urine: 127.4 mg/dL
PROTEIN UR: 45.9 mg/dL
Protein/Creat Ratio: 360 mg/g creat — ABNORMAL HIGH (ref 0–200)

## 2018-01-30 ENCOUNTER — Other Ambulatory Visit: Payer: Self-pay | Admitting: Family Medicine

## 2018-01-30 DIAGNOSIS — R809 Proteinuria, unspecified: Secondary | ICD-10-CM

## 2018-01-30 DIAGNOSIS — Z794 Long term (current) use of insulin: Secondary | ICD-10-CM

## 2018-01-30 DIAGNOSIS — E1165 Type 2 diabetes mellitus with hyperglycemia: Secondary | ICD-10-CM

## 2018-01-30 MED ORDER — LISINOPRIL 2.5 MG PO TABS: 2.5000 mg | ORAL_TABLET | Freq: Every day | ORAL | 5 refills | Status: DC

## 2018-01-30 NOTE — Assessment & Plan Note (Signed)
No signs of UTI, likely due to poorly controlled diabetes and now with protein/cr ration 360, will start lisinopril 2.5 mg daily

## 2018-01-30 NOTE — Progress Notes (Signed)
See result note from 11/19 regarding proteinuria from 11/11.

## 2018-01-30 NOTE — Assessment & Plan Note (Addendum)
Long discussion with patient regarding uncontrolled dm. Patient previously with A1c to 8 and notes that her lifestyle was much different. See goals in AVS.   Will start glipizide 5mg  once daily today as patient does not want to increase insulin as she believes that is makes her nauseas and dizzy.   Protein/Cr ratio of 360 noted in office today, will start low dose ACEi.

## 2018-02-01 ENCOUNTER — Ambulatory Visit: Payer: Self-pay | Admitting: Gastroenterology

## 2018-08-27 ENCOUNTER — Telehealth: Payer: Self-pay

## 2018-08-27 NOTE — Telephone Encounter (Signed)
----- Message from Sim Boast sent at 08/27/2018 11:15 AM EDT ----- Regarding: RE: labs due Hi Jan,  I just spoke with this pt regarding her pending labs. She stated that she is still waiting to get an appt to renew the financial assistance. She is going to call again and call us back when it is renewed. She stated that without that she cannot afford to have labs done. Let's follow up with her next month.  Thank you,  Tye [place] :)    ----- Message ----- From: Roetta Sessions, CMA Sent: 08/07/2018  10:27 AM EDT To: Sim Boast Subject: FW: labs due                                   Hi Cathy - When you have time could you reach out to this pt.  She needs to go to the lab but she was needing to renew her financial assistance.    Thanks! Jan  ----- Message ----- From: Roetta Sessions, CMA Sent: 08/07/2018 To: Roetta Sessions, CMA Subject: FW: labs due                                    ----- Message ----- From: Thelma Barge, Catherin S Sent: 07/24/2018   9:37 AM EDT To: Roetta Sessions, CMA Subject: RE: labs due                                   Hi Jan,  I spoke with pt, she stated that her financial assistance has expired and she has not been able to renew it due to Covid 19. She is going to contact [facility] again to see if they are making appts to renew it. I told her that I will follow up with her in a couple of weeks then. Do you mind setting up a reminder to call her in few weeks?  Thank you very much,    Cathy :)  ----- Message ----- From: Roetta Sessions, CMA Sent: 07/24/2018   8:32 AM EDT To: Sim Boast Subject: FW: labs due                                   Cathy - would you please call this spanish speaking pt and let them know it is time for them to go to the lab for liver function lab work for Dr. Havery Moros?  She does not need an appt and the lab is open from 8:00am to 4:30pm right now.    If you would, please document as a telephone  call in the chart.   Coralie Keens  ----- Message ----- From: Roetta Sessions, CMA Sent: 07/24/2018 To: Roetta Sessions, CMA Subject: FW: labs due                                   Due to Covid 19 - push out until May  ----- Message ----- From: Roetta Sessions, CMA Sent: 06/27/2018 To: Roetta Sessions, CMA Subject: labs due  Pt speaks spanish.  Due for repeat LFTs (elevated liver enzymes/fatty liver) in approximately April 2020.  Pt may have had labs more recently. ?  Orders entered. Check with Dr. Loni Muse on timing of repeat labs.

## 2018-08-27 NOTE — Telephone Encounter (Signed)
Thank you, Cathy.

## 2018-09-27 ENCOUNTER — Telehealth: Payer: Self-pay

## 2018-09-27 NOTE — Telephone Encounter (Signed)
----- Message from Roetta Sessions, [place] sent at 08/27/2018 11:37 AM EDT ----- Regarding: FW: labs due  ----- Message ----- From: Thelma Barge, Catherin S Sent: 08/27/2018  11:15 AM EDT To: Roetta Sessions, CMA Subject: RE: labs due                                   Hi Jan,  I just spoke with this pt regarding her pending labs. She stated that she is still waiting to get an appt to renew the financial assistance. She is going to call again and call us back when it is renewed. She stated that without that she cannot afford to have labs done. Let's follow up with her next month.  Thank you,  Tye [place] :)    ----- Message ----- From: Roetta Sessions, CMA Sent: 08/07/2018  10:27 AM EDT To: Sim Boast Subject: FW: labs due                                   Hi Cathy - When you have time could you reach out to this pt.  She needs to go to the lab but she was needing to renew her financial assistance.    Thanks! Jan  ----- Message ----- From: Roetta Sessions, CMA Sent: 08/07/2018 To: Roetta Sessions, CMA Subject: FW: labs due                                    ----- Message ----- From: Thelma Barge, Catherin S Sent: 07/24/2018   9:37 AM EDT To: Roetta Sessions, CMA Subject: RE: labs due                                   Hi Jan,  I spoke with pt, she stated that her financial assistance has expired and she has not been able to renew it due to Covid 19. She is going to contact [facility] again to see if they are making appts to renew it. I told her that I will follow up with her in a couple of weeks then. Do you mind setting up a reminder to call her in few weeks?  Thank you very much,    Cathy :)  ----- Message ----- From: Roetta Sessions, CMA Sent: 07/24/2018   8:32 AM EDT To: Sim Boast Subject: FW: labs due                                   Cathy - would you please call this spanish speaking pt and let them know it is time for them to go to the lab for liver  function lab work for Dr. Havery Moros?  She does not need an appt and the lab is open from 8:00am to 4:30pm right now.    If you would, please document as a telephone call in the chart.   Coralie Keens  ----- Message ----- From: Roetta Sessions, CMA Sent: 07/24/2018 To: Roetta Sessions, CMA Subject: FW: labs due  Due to Covid 19 - push out until May  ----- Message ----- From: Roetta Sessions, CMA Sent: 06/27/2018 To: Roetta Sessions, CMA Subject: labs due                                       Pt speaks spanish.  Due for repeat LFTs (elevated liver enzymes/fatty liver) in approximately April 2020.  Pt may have had labs more recently. ?  Orders entered. Check with Dr. Loni Muse on timing of repeat labs.

## 2018-09-27 NOTE — Telephone Encounter (Signed)
Cathy - at your convenience could you please reach out to this sweet spanish speaking pt to see if she is able to go to the lab for the Hepatic Function Panel Dr. Havery Moros has ordered? I understand she was waiting on an appt to renew her patient assistance.Thank you for your help.

## 2018-09-27 NOTE — Telephone Encounter (Signed)
Hi [name], I spoke with pt, she is still in the process to renew the financial assistance, she is missing one document and unfortunately she is not sure of when she will be able to get that paper. I told her that I will f/u with her next month. Thank you.

## 2019-02-26 ENCOUNTER — Other Ambulatory Visit: Payer: Self-pay | Admitting: Critical Care Medicine

## 2019-02-26 ENCOUNTER — Telehealth: Payer: Self-pay | Admitting: Pediatric Intensive Care

## 2019-02-26 ENCOUNTER — Encounter: Payer: Self-pay | Admitting: Pediatric Intensive Care

## 2019-02-26 DIAGNOSIS — E119 Type 2 diabetes mellitus without complications: Secondary | ICD-10-CM

## 2019-02-26 DIAGNOSIS — Z794 Long term (current) use of insulin: Secondary | ICD-10-CM

## 2019-02-26 DIAGNOSIS — E1165 Type 2 diabetes mellitus with hyperglycemia: Secondary | ICD-10-CM

## 2019-02-26 LAB — GLUCOSE, POCT (MANUAL RESULT ENTRY): POC Glucose: 314 mg/dl — AB (ref 70–99)

## 2019-02-26 MED ORDER — TRUEPLUS LANCETS 28G MISC: 1 refills | Status: DC

## 2019-02-26 MED ORDER — GLUCOSE BLOOD VI STRP: ORAL_STRIP | 12 refills | Status: DC

## 2019-02-26 MED ORDER — LISINOPRIL 2.5 MG PO TABS: 2.5000 mg | ORAL_TABLET | Freq: Every day | ORAL | 5 refills | Status: DC

## 2019-02-26 MED ORDER — GLIPIZIDE 5 MG PO TABS: 5.0000 mg | ORAL_TABLET | Freq: Every day | ORAL | 3 refills | Status: DC

## 2019-02-26 MED ORDER — TRUE METRIX BLOOD GLUCOSE TEST VI STRP: ORAL_STRIP | 12 refills | Status: DC

## 2019-02-26 MED ORDER — METFORMIN HCL ER 500 MG PO TB24: 1000.0000 mg | ORAL_TABLET | Freq: Two times a day (BID) | ORAL | 4 refills | Status: DC

## 2019-02-26 MED ORDER — INSULIN NPH (HUMAN) (ISOPHANE) 100 UNIT/ML ~~LOC~~ SUSP: 10.0000 [IU] | Freq: Two times a day (BID) | SUBCUTANEOUS | 11 refills | Status: DC

## 2019-02-26 MED ORDER — TRUE METRIX METER W/DEVICE KIT: PACK | 0 refills | Status: DC

## 2019-02-26 MED FILL — glipiZIDE 5 MG TABS: 5 | 30 days supply | Qty: 30 | Fill #0

## 2019-02-26 MED FILL — HumuLIN N 100 UNIT/ML SUSP: 100 | 30 days supply | Qty: 10 | Fill #0

## 2019-02-26 MED FILL — METFORMIN HCL ER 500 MG TB2: 500 | 30 days supply | Qty: 120 | Fill #0

## 2019-02-26 MED FILL — LISINOPRIL 2.5 MG TABLET: 2.5 | 30 days supply | Qty: 30 | Fill #0

## 2019-02-26 NOTE — Telephone Encounter (Signed)
Call from client regarding type of glucometer- she states it is a Environmental health practitioner and it is a very old glucometer. CN advised client that she can deliver medication to her home tomorrow weather permitting.CN advised client that Dr Joya Gaskins prescribed lisinopril and to take per instructions. CN will review medication at drop off. Lisette Abu RN BSN CNP [phone]

## 2019-02-26 NOTE — [facility] Nurse Program (Signed)
  Dept: [facility] Nurse Program Note  Date of Encounter: 02/26/2019  Past Medical History: Past Medical History:  Diagnosis Date  . Chronic chest pain   . Chronic pelvic pain in female   . DDD (degenerative disc disease), cervical   . Depression   . Diabetes mellitus   . Dizziness   . Fatty liver   . GERD (gastroesophageal reflux disease)   . H/O echocardiogram 09/2013   normal  . Headache   . Hypertension   . Peripheral neuropathy   . Renal disorder     Encounter Details: Client is here for flu vaccination and for health check. She has a history of diabetes and was a patient at [facility]. She has not had a clinic appointment at [facility] recently and ran out of medication at the "the start of the pandemic". Client states that she would like a clinic appointment at a different clinic. BG/BP/flu vaccination completed. CN reviewed medications from EMR- client states that she is comfortable checking her blood glucose (no strips currently) and injecting insulin. CN advised client that she will make referral for PCP appointment. Notify Dr Joya Gaskins to bridge medication for client. Follow up with client via phone next week.  Lisette Abu RN BSN CNP [phone]

## 2019-02-26 NOTE — Progress Notes (Signed)
HTN and T2DM med refills

## 2019-02-27 MED FILL — !TRUE METRIX BLOOD GLUCOSE: 1 days supply | Qty: 1 | Fill #0

## 2019-02-27 MED FILL — TRUE METRIX GLUCOSE TEST ST: 50 days supply | Qty: 100 | Fill #0

## 2019-02-27 MED FILL — TRUE METRIX GLUCOSE TEST ST: 50 days supply | Qty: 100 | Fill #0 | Status: TO

## 2019-02-27 MED FILL — TRUE METRIX BLOOD GLUCOSE M: W/DEVICE | 1 days supply | Qty: 1 | Fill #0 | Status: TO

## 2019-02-27 MED FILL — TRUEplus LANCETS 28G MISC: 50 days supply | Qty: 100 | Fill #0 | Status: TO

## 2019-02-27 MED FILL — TRUEplus LANCETS 28G MISC: 50 days supply | Qty: 100 | Fill #0

## 2019-03-04 ENCOUNTER — Other Ambulatory Visit (HOSPITAL_COMMUNITY): Payer: Self-pay | Admitting: *Deleted

## 2019-03-04 DIAGNOSIS — Z1231 Encounter for screening mammogram for malignant neoplasm of breast: Secondary | ICD-10-CM

## 2019-03-06 ENCOUNTER — Other Ambulatory Visit: Payer: Self-pay

## 2019-03-06 ENCOUNTER — Encounter: Payer: Self-pay | Admitting: Critical Care Medicine

## 2019-03-06 ENCOUNTER — Ambulatory Visit: Payer: Self-pay | Attending: Critical Care Medicine | Admitting: Critical Care Medicine

## 2019-03-06 VITALS — BP 129/81 | HR 79 | Temp 98.0°F | Ht 59.0 in | Wt 216.0 lb

## 2019-03-06 DIAGNOSIS — I10 Essential (primary) hypertension: Secondary | ICD-10-CM

## 2019-03-06 DIAGNOSIS — K219 Gastro-esophageal reflux disease without esophagitis: Secondary | ICD-10-CM

## 2019-03-06 DIAGNOSIS — B351 Tinea unguium: Secondary | ICD-10-CM

## 2019-03-06 DIAGNOSIS — Z794 Long term (current) use of insulin: Secondary | ICD-10-CM

## 2019-03-06 DIAGNOSIS — E1149 Type 2 diabetes mellitus with other diabetic neurological complication: Secondary | ICD-10-CM

## 2019-03-06 DIAGNOSIS — R079 Chest pain, unspecified: Secondary | ICD-10-CM

## 2019-03-06 DIAGNOSIS — E1165 Type 2 diabetes mellitus with hyperglycemia: Secondary | ICD-10-CM

## 2019-03-06 LAB — POCT GLYCOSYLATED HEMOGLOBIN (HGB A1C): HbA1c, POC (controlled diabetic range): 11.8 % — AB (ref 0.0–7.0)

## 2019-03-06 LAB — GLUCOSE, POCT (MANUAL RESULT ENTRY): POC Glucose: 305 mg/dl — AB (ref 70–99)

## 2019-03-06 MED ORDER — LISINOPRIL 2.5 MG PO TABS: 2.5000 mg | ORAL_TABLET | Freq: Every day | ORAL | 5 refills | Status: DC

## 2019-03-06 MED ORDER — INSULIN LISPRO (1 UNIT DIAL) 100 UNIT/ML (KWIKPEN): 6.0000 [IU] | PEN_INJECTOR | Freq: Two times a day (BID) | SUBCUTANEOUS | 3 refills | Status: DC

## 2019-03-06 MED ORDER — TRUEPLUS LANCETS 28G MISC: 1 refills | Status: DC

## 2019-03-06 MED ORDER — PEN NEEDLES 29G X 12MM MISC: 2 refills | Status: DC

## 2019-03-06 MED ORDER — PANTOPRAZOLE SODIUM 40 MG PO TBEC: 40.0000 mg | DELAYED_RELEASE_TABLET | Freq: Every day | ORAL | 1 refills | Status: DC

## 2019-03-06 MED ORDER — LANTUS SOLOSTAR 100 UNIT/ML ~~LOC~~ SOPN: 25.0000 [IU] | PEN_INJECTOR | Freq: Every day | SUBCUTANEOUS | 3 refills | Status: DC

## 2019-03-06 MED ORDER — METFORMIN HCL ER 500 MG PO TB24: 1000.0000 mg | ORAL_TABLET | Freq: Two times a day (BID) | ORAL | 4 refills | Status: DC

## 2019-03-06 MED ORDER — TRUE METRIX BLOOD GLUCOSE TEST VI STRP: ORAL_STRIP | 12 refills | Status: DC

## 2019-03-06 MED FILL — TRUEPLUS PEN NDL 31GX5/16: 31G X 8 MM | 25 days supply | Qty: 100 | Fill #0

## 2019-03-06 MED FILL — PANTOPRAZOLE SOD DR 40 MG T: 40 | 30 days supply | Qty: 30 | Fill #0

## 2019-03-06 MED FILL — !HUMALOG 100 UNITS/ML KWIKP: 100 | 25 days supply | Qty: 3 | Fill #0

## 2019-03-06 MED FILL — !LANTUS SOLOSTAR 100UNITS/M: 100 | 24 days supply | Qty: 6 | Fill #0

## 2019-03-06 NOTE — Assessment & Plan Note (Signed)
Chest pain appears to be on the basis of esophagitis  EKG is normal

## 2019-03-06 NOTE — Assessment & Plan Note (Signed)
Uncontrolled diabetes with long-term use of insulin  Plan will be to discontinue glipizide and begin Lantus 25 units daily  We will discontinue NPH and begin short acting Humalog insulin lispro at 6 units for twice daily 2 largest meals of the day  We will continue Metformin at 1000 mg twice a day and obtain for the patient glucose testing supplies  The patient will meet with our clinical pharmacist as well  Diabetic diet was issued to the patient as well  Nutritionist visit will be scheduled

## 2019-03-06 NOTE — Assessment & Plan Note (Signed)
Hypertension under good control at this time We will refill the Zestril 2.5 mg daily

## 2019-03-06 NOTE — Assessment & Plan Note (Signed)
Chest pain likely from esophageal spasm and reflux disease  I have given the patient esophageal reflux diet and also would refill the Protonix daily  Note EKG today was normal and we will check a troponin

## 2019-03-06 NOTE — Assessment & Plan Note (Signed)
Morbid obesity as well  I have asked the patient to begin moving more and begin the diabetic diet  Nutrition normal consult also be obtained

## 2019-03-06 NOTE — Assessment & Plan Note (Signed)
Ongoing toenail fungus in both great toes  Will refer to podiatry

## 2019-03-06 NOTE — Patient Instructions (Addendum)
Start Lantus 25 units daily  Stop NPH insulin and glipizide  Begin Humalog 6 units twice daily before your 2 largest meals of the day  Continue metformin at 1000 mg twice daily  Your glucose testing supplies were renewed and are at this pharmacy  All your other medications were refilled and are at this pharmacy  An appointment with a foot doctor will be made  An appointment with medical nutrition for diabetes will be made  An appointment with our clinical pharmacist will be made in the next 2 weeks to discuss your diabetic treatments  An EKG was performed and was normal  A tetanus vaccine was given  Blood work to check your body chemistries blood counts heart enzymes and urine for protein are obtained today  Return visit in 6 weeks to establish with a new primary care doctor and for a Pap smear   La diabetes mellitus y el cuidado de los pies Diabetes Mellitus and [place] los pies es un aspecto importante de la salud, especialmente si tiene diabetes. La diabetes puede generar problemas debido a que el flujo sanguneo (circulacin) es deficiente en las piernas y los pies, y esto puede hacer que la piel:  Se torne ms fina y Audiological scientist.  Se resquebraje ms fcilmente.  Cicatrice ms lentamente.  Se descame y agriete. Tambin pueden estar daados los nervios (neuropata) de las piernas y de los pies, lo que provoca una disminucin de la sensibilidad. En consecuencia, es posible que no advierta heridas pequeas en los pies que pueden causar problemas ms graves. Identificar y tratar cualquier complicacin lo antes posible es la mejor manera de evitar futuros problemas de pie. Cmo cuidar los pies Higiene de los pies  [employer] pies todos los das con agua tibia y un [place]. No use agua caliente. Luego squese los pies y [employer] dedos dando palmaditas, hasta que estn completamente secos. No remoje los pies, ya que esto puede resecar la piel.  Crtese las uas  de los pies en lnea recta. No escarbe debajo de las uas o alrededor [employer]. Lime los bordes de las uas con una lima o esmeril.  Aplique una locin hidratante o vaselina en la piel de los pies y en las uas secas y [place]. Use una locin que no contenga alcohol ni fragancias. No aplique locin entre los dedos. Zapatos y calcetines  Use calcetines de algodn o medias limpias todos los [place]. Asegrese de que no le [employer]. No use calcetines que le lleguen a las rodillas, ya que podran disminuir el flujo de sangre a las piernas.  Use zapatos de cuero que le queden bien y que sean acolchados. Revise siempre los zapatos antes de ponerlos para asegurarse de que no haya objetos en su interior.  Para amoldar los zapatos, clcelos solo algunas horas por da. Esto evitar lesiones en los pies. Heridas, rasguos, durezas y callosidades  Controle sus pies diariamente para observar si hay ampollas, cortes, moretones, llagas o enrojecimiento. Si no puede ver la planta del pie, use un espejo o pdale ayuda a Nurse, children's.  No corte las durezas o callosidades, ni trate de quitarlas con medicamentos.  Si algo le ha raspado, cortado o lastimado la piel de los pies, mantenga la piel de esa zona limpia y [place]. Puede higienizar estas zonas con agua y un jabn suave. No limpie la zona con agua oxigenada, alcohol ni yodo.  Si tiene una herida, un rasguo, Ardelia Mems dureza o  una callosidad en el pie, revsela varias veces al da para asegurarse de que se est curando y no se infecte. Est atento a los siguientes signos: ? Dolor, hinchazn o enrojecimiento. ? Lquido o sangre. ? Calor. ? Pus o mal olor. Instrucciones generales  No se cruce de piernas. Esto puede disminuir el flujo de sangre a los pies.  No use bolsas de agua caliente ni almohadillas trmicas en los pies. Podran causar quemaduras. Si ha perdido la sensibilidad en los pies o las piernas, no sabr lo que le est sucediendo  hasta que sea demasiado tarde.  Proteja sus pies del calor y del fro con calzado, en la playa o sobre el pavimento caliente.  Programe una cita para un examen completo de los pies por lo menos una vez al ao (anualmente) o con ms frecuencia si tiene [employer]. Si tiene [employer], infrmele al mdico de [employer] cortes, las llagas o los moretones. Comunquese con un mdico si:  Tiene una afeccin que aumenta su riesgo de tener infecciones y tiene cortes, llagas o moretones en los pies.  Tiene una lesin que no se [place].  Tiene una zona irritada en las piernas o los pies.  Siente una sensacin de ardor u hormigueo en las piernas o los pies.  Siente dolor o calambres en las piernas o los pies.  Las piernas o los pies estn adormecidos.  Siente los pies siempre fros.  Siente dolor alrededor de una ua del pie. Solicite ayuda de inmediato si:  Tiene una herida, un rasguo, una dureza o una callosidad en el pie y: ? Scientist, clinical (histocompatibility and immunogenetics), hinchazn o enrojecimiento que empeora. ? Le sale lquido o sangre de la herida, el rasguo, la dureza o la callosidad. ? La herida, el rasguo, la dureza o la callosidad est caliente al tacto. ? Le sale pus o mal olor de la herida, el rasguo, la dureza o la callosidad. ? Tiene fiebre. ? Tiene una lnea roja que sube por la pierna. Resumen  Controle todos los das el estado de sus pies para observar si hay cortes, llagas, manchas rojas, hinchazn o ampollas.  Humctese los pies y las piernas a diario.  Use zapatos de cuero que le queden bien y que sean acolchados.  Si tiene [employer], infrmele al mdico de [employer] cortes, las llagas o los moretones.  Programe una cita para un examen completo de los pies por lo menos una vez al ao (anualmente) o con ms frecuencia si tiene [employer]. Esta informacin no tiene Marine scientist el consejo del mdico. Asegrese de hacerle al mdico  cualquier pregunta que tenga. Document Released: 02/28/2005 Document Revised: 10/21/2016 Document Reviewed: 10/21/2016 Elsevier Patient Education  2020 [employer].  Diabetes mellitus y nutricin, en adultos Diabetes Mellitus and Nutrition, Adult Si sufre de diabetes (diabetes mellitus), es muy importante tener hbitos alimenticios saludables debido a que sus niveles de Designer, television/film set sangre (glucosa) se ven afectados en gran medida por lo que come y bebe. [place] alimentos saludables en las cantidades [place], aproximadamente a la [employer], [place] ayudar a:  Aeronautical engineer glucemia.  Disminuir el riesgo de sufrir una enfermedad cardaca.  Mejorar la presin arterial.  Science writer o mantener un peso saludable. Todas las personas que sufren de diabetes son diferentes y cada una tiene necesidades diferentes en cuanto a un plan de alimentacin. El mdico puede recomendarle que trabaje con un especialista  en dietas y nutricin (nutricionista) para Financial trader plan para usted. Su plan de alimentacin puede variar segn factores como:  Las caloras que necesita.  Los medicamentos que toma.  Su peso.  Sus niveles de glucemia, presin arterial y colesterol.  Su nivel de [place].  Otras afecciones que tenga, como enfermedades cardacas o renales. Cmo me afectan los carbohidratos? Los carbohidratos, o hidratos de carbono, afectan su nivel de glucemia ms que cualquier otro tipo de alimento. La ingesta de carbohidratos naturalmente aumenta la cantidad de [employer]. El recuento de carbohidratos es un mtodo destinado a Catering manager un registro de la cantidad de carbohidratos que se consumen. El recuento de carbohidratos es importante para Theatre manager la glucemia a un nivel saludable, especialmente si utiliza insulina o toma determinados medicamentos por va oral para la diabetes. Es importante conocer la cantidad de carbohidratos que se pueden ingerir en cada comida sin correr  Engineer, manufacturing. Esto es Psychologist, forensic. Su nutricionista puede ayudarlo a calcular la cantidad de carbohidratos que debe ingerir en cada comida y en cada refrigerio. Entre los alimentos que contienen carbohidratos, se incluyen:  Pan, cereal, arroz, pastas y galletas.  Papas y maz.  Guisantes, frijoles y lentejas.  Leche y Estate agent.  Lambert Mody y [place].  Postres, como pasteles, galletas, helado y caramelos. Cmo me afecta el alcohol? El alcohol puede provocar disminuciones sbitas de la glucemia (hipoglucemia), especialmente si utiliza insulina o toma determinados medicamentos por va oral para la diabetes. La hipoglucemia es una afeccin potencialmente mortal. Los sntomas de la hipoglucemia (somnolencia, mareos y confusin) son similares a los sntomas de haber consumido demasiado alcohol. Si el mdico afirma que el alcohol es seguro para usted, [place] estas pautas:  Limite el consumo de alcohol a no ms de 42medida por da si es mujer y no est [place], y a 29medidas si es hombre. Una medida equivale a 12oz (369ml) de cerveza, 5oz (116ml) de vino o 1oz (22ml) de bebidas alcohlicas de alta graduacin.  No beba con el estmago vaco.  Mantngase hidratado bebiendo agua, refrescos dietticos o t helado sin azcar.  Tenga en cuenta que los refrescos comunes, los jugos y otras bebida para Optician, dispensing pueden contener mucha azcar y se deben contar como carbohidratos. Cules son algunos consejos para seguir este plan?  Leer las etiquetas de los alimentos  Comience por leer el tamao de la porcin en la "Informacin nutricional" en las etiquetas de los alimentos envasados y las bebidas. La cantidad de caloras, carbohidratos, grasas y otros nutrientes mencionados en la etiqueta se basan en una porcin del alimento. Muchos alimentos contienen ms de una porcin por envase.  Verifique la cantidad total de gramos (g) de carbohidratos totales en una porcin. Puede calcular la cantidad  de porciones de carbohidratos al dividir el total de carbohidratos por 15. Por ejemplo, si un alimento tiene un total de 30g de carbohidratos, equivale a 2 porciones de carbohidratos.  Verifique la cantidad de gramos (g) de grasas saturadas y grasas trans en una porcin. Escoja alimentos que no contengan grasa o que tengan un bajo contenido.  Verifique la cantidad de miligramos (mg) de sal (sodio) en una porcin. La [employer] de las personas deben limitar la ingesta de sodio total a menos de 2300mg  por Training and development officer.  Siempre consulte la informacin nutricional de los alimentos etiquetados como "con bajo contenido de grasa" o "sin grasa". Estos alimentos pueden tener un mayor contenido de Location manager agregada o carbohidratos refinados, y deben evitarse.  Hable con  su nutricionista para identificar sus objetivos diarios en cuanto a los nutrientes mencionados en la etiqueta. Al ir de compras  Evite comprar alimentos procesados, enlatados o precocinados. Estos alimentos tienden a Special educational needs teacher mayor cantidad de [place], sodio y azcar agregada.  Compre en la zona exterior de la tienda de comestibles. Esta zona incluye frutas y verduras frescas, granos a granel, carnes frescas y productos lcteos frescos. Al cocinar  Utilice mtodos de coccin a baja temperatura, como hornear, en lugar de mtodos de coccin a alta temperatura, como frer en abundante aceite.  Cocine con aceites saludables, como el aceite de [place], canola o [place].  Evite cocinar con manteca, crema o carnes con alto contenido de grasa. Planificacin de las comidas  Coma las comidas y los refrigerios regularmente, preferentemente a la misma hora todos [place]. Evite pasar largos perodos de tiempo sin comer.  Consuma alimentos ricos en fibra, como frutas frescas, verduras, frijoles y cereales integrales. Consulte a su nutricionista sobre cuntas porciones de carbohidratos puede consumir en cada comida.  Consuma entre 4 y 6 onzas (oz) de protenas  magras por da, como carnes [place], pollo, pescado, huevos o tofu. Una onza de protena magra equivale a: ? 1 onza de carne, pollo o pescado. ? 1huevo. ?  taza de tofu.  Coma algunos alimentos por da que contengan grasas saludables, como aguacates, frutos secos, semillas y pescado. Estilo de vida  Controle su nivel de glucemia con regularidad.  Haga actividad fsica habitualmente como se lo haya indicado el mdico. Esto puede incluir lo siguiente: ? 19minutos semanales de ejercicio de intensidad moderada o alta. Esto podra incluir caminatas dinmicas, ciclismo o gimnasia acutica. ? Realizar ejercicios de elongacin y de fortalecimiento, como yoga o levantamiento de pesas, por lo menos 2veces por semana.  Tome los [employer] se lo haya indicado el mdico.  No consuma ningn producto que contenga nicotina o tabaco, como cigarrillos y Psychologist, sport and exercise. Si necesita ayuda para dejar de fumar, consulte al [employer] con un asesor o instructor en diabetes para identificar estrategias para controlar el estrs y cualquier desafo emocional y social. Preguntas para hacerle al mdico  Es necesario que consulte a Radio broadcast assistant en el cuidado de la diabetes?  Es necesario que me rena con un nutricionista?  A qu nmero puedo llamar si tengo preguntas?  Cules son los mejores momentos para controlar la glucemia? Dnde encontrar ms informacin:  Asociacin Estadounidense de la Diabetes (American Diabetes Association): diabetes.org  Academia de Nutricin y Information systems manager (Academy of Nutrition and Dietetics): www.eatright.[place] y las Enfermedades Digestivas y [facility] of Diabetes and Digestive and Kidney Diseases, NIH): [employer].dk Resumen  Un plan de alimentacin saludable lo ayudar a Aeronautical engineer glucemia y Theatre manager un estilo de vida saludable.  Trabajar con un especialista en dietas y nutricin  (nutricionista) puede ayudarlo a Insurance claims handler de alimentacin para usted.  Tenga en cuenta que los carbohidratos (hidratos de carbono) y el alcohol tienen efectos inmediatos en sus niveles de glucemia. Es importante contar los carbohidratos que ingiere y consumir alcohol con prudencia. Esta informacin no tiene Marine scientist el consejo del mdico. Asegrese de hacerle al mdico cualquier pregunta que tenga. Document Released: 06/07/2007 Document Revised: 11/08/2016 Document Reviewed: 06/20/2016 Elsevier Patient Education  2020 [employer].

## 2019-03-06 NOTE — Progress Notes (Signed)
Subjective:    Patient ID: [name], female    DOB: 01/01/1976, 43 y.o.   MRN: [id number]  This encounter was conducted using a Spanish interpreter in person [name]  This is a pleasant 43 year old Latina female who is referred by [facility] for treatment of hypertension type 2 diabetes.  She has been in the [place] for 20 years.  She had been with the family practice center and saw them last in November 2019.  She left that practice because she felt the front office was rude to her.  Unfortunately she ran out of her diabetes medications and blood pressure medicines for about 3 months.  I connected with [facility] provided bridging mass patient for blood pressure and diabetic medications.  She is here now to  officially establish in our clinic  The patient states her glucose often runs over 200 and note today hemoglobin A1c is 11.8.  She complains of lower extremity neuropathic pain and numbness in the feet.  Her orange card had expired but now is been renewed She has been a diabetic for 18 years and has had hypertension for 6 years.  She is not often compliant with her diabetic diet.  She also complains of occasional chest pain and has had reflux disease with GI bleeding with upper endoscopy showing esophagitis in the past.  She is not currently on any antacids at this time.  She is not on any medications for her neuropathy.  The patient does take Metformin 1000 mg twice daily and NPH twice daily she also is on glipizide as well.  She is on a low-dose of lisinopril with good blood pressure control on this medication.  She is run out of glucose testing supplies as well.  She is due a tetanus shot and Pap smear and foot exam  She is overdue on all her laboratory studies she is last had labs and urinalysis testing for albumin and April 2019  She did receive a flu shot earlier this month  Past Medical History:  Diagnosis Date  . Chronic chest pain    . Chronic pelvic pain in female   . DDD (degenerative disc disease), cervical   . Depression   . Diabetes mellitus   . Dizziness   . Fatty liver   . GERD (gastroesophageal reflux disease)   . H/O echocardiogram 09/2013   normal  . Headache   . Hypertension   . Peripheral neuropathy   . Renal disorder      Family History  Problem Relation Age of Onset  . Hypertension Mother   . Diabetes Father   . Hypertension Sister   . Diabetes Sister   . Diabetes Daughter   . Pancreatitis Daughter   . Migraines Daughter   . Asthma Daughter   . Colon cancer Neg Hx      Social History   Socioeconomic History  . Marital status: Single    Spouse name: Not on file  . Number of children: 3  . Years of education: Not on file  . Highest education level: Not on file  Occupational History  . Not on file  Tobacco Use  . Smoking status: Never Smoker  . Smokeless tobacco: Never Used  Substance and Sexual Activity  . Alcohol use: No  . Drug use: No  . Sexual activity: Yes    Birth control/protection: Surgical  Other Topics Concern  . Not on file  Social History Narrative   ** Merged History Encounter **  Social Determinants of Health   Financial Resource Strain:   . Difficulty of Paying Living Expenses: Not on file  Food Insecurity:   . Worried About Charity fundraiser in the Last Year: Not on file  . Ran Out of Food in the Last Year: Not on file  Transportation Needs:   . Lack of Transportation (Medical): Not on file  . Lack of Transportation (Non-Medical): Not on file  Physical Activity:   . Days of Exercise per Week: Not on file  . Minutes of Exercise per Session: Not on file  Stress:   . Feeling of Stress : Not on file  Social Connections:   . Frequency of Communication with Friends and Family: Not on file  . Frequency of Social Gatherings with Friends and Family: Not on file  . Attends Religious Services: Not on file  . Active Member of Clubs or Organizations:  Not on file  . Attends Archivist Meetings: Not on file  . Marital Status: Not on file  Intimate Partner Violence:   . Fear of Current or Ex-Partner: Not on file  . Emotionally Abused: Not on file  . Physically Abused: Not on file  . Sexually Abused: Not on file     No Known Allergies   Outpatient Medications Prior to Visit  Medication Sig Dispense Refill  . blood glucose meter kit and supplies KIT Dispense based on patient and insurance preference. Use up to four times daily as directed. (FOR ICD-9 250.00, 250.01). 1 each 0  . Blood Glucose Monitoring Suppl (TRUE METRIX METER) w/Device KIT Use to measure blood sugar twice a day 1 kit 0  . glipiZIDE (GLUCOTROL) 5 MG tablet Take 1 tablet (5 mg total) by mouth daily before breakfast. 30 tablet 3  . glucose blood (TRUE METRIX BLOOD GLUCOSE TEST) test strip Use as instructed 100 each 12  . insulin NPH Human (NOVOLIN N) 100 UNIT/ML injection Inject 0.1 mLs (10 Units total) into the skin 2 (two) times daily before a meal. 10 mL 11  . lisinopril (ZESTRIL) 2.5 MG tablet Take 1 tablet (2.5 mg total) by mouth daily. 30 tablet 5  . metFORMIN (GLUCOPHAGE XR) 500 MG 24 hr tablet Take 2 tablets (1,000 mg total) by mouth 2 (two) times daily with a meal. 120 tablet 4  . ibuprofen (ADVIL,MOTRIN) 200 MG tablet Take 400 mg by mouth every 6 (six) hours as needed.    . AMBULATORY NON FORMULARY MEDICATION Medication Name: Nitroglycerine ointment 0.125 %  Apply a pea sized amount internally three times daily. Dispense 30 GM zero refill (Patient not taking: Reported on 03/06/2019) 30 g 0  . amitriptyline (ELAVIL) 25 MG tablet Take 1 tablet (25 mg total) by mouth at bedtime. (Patient not taking: Reported on 03/06/2019) 30 tablet 1  . dicyclomine (BENTYL) 10 MG capsule Take 1-2 capsules (10-20 mg total) by mouth every 8 (eight) hours as needed for spasms. (Patient not taking: Reported on 03/06/2019) 30 capsule 2  . pantoprazole (PROTONIX) 40 MG tablet  Take 1 tablet (40 mg total) by mouth daily. (Patient not taking: Reported on 03/06/2019) 30 tablet 1  . traMADol (ULTRAM) 50 MG tablet Take 1 tablet (50 mg total) by mouth every 6 (six) hours as needed. (Patient not taking: Reported on 03/06/2019) 15 tablet 0  . TRUEplus Lancets 28G MISC Use to measure blood sugar twice a day 100 each 1   No facility-administered medications prior to visit.      Review of  Systems Constitutional:   No  weight loss, night sweats,  Fevers, chills, fatigue, lassitude. HEENT:   No headaches,  Difficulty swallowing,  Tooth/dental problems,  Sore throat,                No sneezing, itching, ear ache, nasal congestion, post nasal drip,   CV:  No chest pain,  Orthopnea, PND, swelling in lower extremities, anasarca, dizziness, palpitations  GI  No heartburn, indigestion, abdominal pain, nausea, vomiting, diarrhea, change in bowel habits, loss of appetite  Resp: No shortness of breath with exertion or at rest.  No excess mucus, no productive cough,  No non-productive cough,  No coughing up of blood.  No change in color of mucus.  No wheezing.  No chest wall deformity  Skin: no rash or lesions.  GU: no dysuria, change in color of urine, no urgency or frequency.  No flank pain.  MS:  No joint pain or swelling.  No decreased range of motion.  No back pain.  Foot pain   Psych:  No change in mood or affect. No depression or anxiety.  No memory loss.     Objective:   Physical Exam Vitals:   03/06/19 0943  BP: 129/81  Pulse: 79  Temp: 98 F (36.7 C)  TempSrc: Oral  SpO2: 97%  Weight: 216 lb (98 kg)  Height: 4' 11"  (1.499 m)    Gen: Pleasant, obese in no distress,  normal affect  ENT: No lesions,  mouth clear,  oropharynx clear, no postnasal drip  Neck: No JVD, no TMG, no carotid bruits  Lungs: No use of accessory muscles, no dullness to percussion, clear without rales or rhonchi  Cardiovascular: RRR, heart sounds normal, no murmur or gallops, no  peripheral edema  Abdomen: soft and NT, no HSM,  BS normal  Musculoskeletal: No deformities, no cyanosis or clubbing  Neuro: alert, non focal,   Skin: Warm, no lesions or rashes Foot exam: toenail fungus and calluses at both great toes.  Numb in heels  CBC Latest Ref Rng & Units 11/12/2017 08/15/2017 07/05/2016  WBC 4.0 - 10.5 K/uL 9.0 7.5 6.3  Hemoglobin 12.0 - 15.0 g/dL 14.1 12.6 13.8  Hematocrit 36.0 - 46.0 % 43.2 38.3 40.7  Platelets 150 - 400 K/uL 232 229 235   Hepatic Function Latest Ref Rng & Units 12/20/2017 11/12/2017 07/05/2016  Total Protein 6.0 - 8.3 g/dL 7.7 6.9 6.9  Albumin 3.5 - 5.2 g/dL 4.1 3.6 3.6  AST 0 - 37 U/L 36 59(H) 31  ALT 0 - 35 U/L 58(H) 56(H) 28  Alk Phosphatase 39 - 117 U/L 140(H) 128(H) 104  Total Bilirubin 0.2 - 1.2 mg/dL 0.6 1.3(H) 0.4  Bilirubin, Direct 0.0 - 0.3 mg/dL 0.1 - -   Lab Results  Component Value Date   HGBA1C 11.8 (A) 03/06/2019   BMP Latest Ref Rng & Units 11/12/2017 08/15/2017 08/11/2016  Glucose 70 - 99 mg/dL 293(H) 126(H) 398(H)  BUN 6 - 20 mg/dL 6 11 12   Creatinine 0.44 - 1.00 mg/dL 0.50 0.43(L) 0.48(L)  BUN/Creat Ratio 9 - 23 - 26(H) 25(H)  Sodium 135 - 145 mmol/L 134(L) 140 140  Potassium 3.5 - 5.1 mmol/L 4.4 4.0 4.5  Chloride 98 - 111 mmol/L 98 103 103  CO2 22 - 32 mmol/L 29 21 22   Calcium 8.9 - 10.3 mg/dL 9.1 9.2 8.8          Assessment & Plan:  I personally reviewed all images and lab data in  the [facility] system as well as any outside material available during this office visit and agree with the  radiology impressions.   Hypertension Hypertension under good control at this time We will refill the Zestril 2.5 mg daily  GERD Chest pain likely from esophageal spasm and reflux disease  I have given the patient esophageal reflux diet and also would refill the Protonix daily  Note EKG today was normal and we will check a troponin  Diabetic neuropathy Evidence for diabetic neuropathy in both lower extremities  Uncontrolled  type 2 diabetes mellitus with hyperglycemia, with long-term current use of insulin ([place]) Uncontrolled diabetes with long-term use of insulin  Plan will be to discontinue glipizide and begin Lantus 25 units daily  We will discontinue NPH and begin short acting Humalog insulin lispro at 6 units for twice daily 2 largest meals of the day  We will continue Metformin at 1000 mg twice a day and obtain for the patient glucose testing supplies  The patient will meet with our clinical pharmacist as well  Diabetic diet was issued to the patient as well  Nutritionist visit will be scheduled  Toenail fungus Ongoing toenail fungus in both great toes  Will refer to podiatry  Chest pain Chest pain appears to be on the basis of esophagitis  EKG is normal  Morbid obesity Morbid obesity as well  I have asked the patient to begin moving more and begin the diabetic diet  Nutrition normal consult also be obtained   [place] was seen today for new patient (initial visit) and diabetes.  Diagnoses and all orders for this visit:  Uncontrolled type 2 diabetes mellitus with hyperglycemia, with long-term current use of insulin (HCC) -     Glucose (CBG) -     HgB A1c -     Microalbumin, urine -     Comprehensive metabolic panel -     CBC with Differential/Platelet; Future -     Lipid panel -     Ambulatory referral to Podiatry -     Amb ref to Medical Nutrition Therapy-MNT -     metFORMIN (GLUCOPHAGE XR) 500 MG 24 hr tablet; Take 2 tablets (1,000 mg total) by mouth 2 (two) times daily with a meal. -     CBC with Differential/Platelet  Toenail fungus -     Ambulatory referral to Podiatry  Chest pain, unspecified type -     EKG 12-Lead -     Troponin I  Hypertension, unspecified type  Other diabetic neurological complication associated with type 2 diabetes mellitus ([place])  Morbid obesity (HCC)  Gastroesophageal reflux disease, unspecified whether esophagitis present  Other orders -      Insulin Glargine (LANTUS SOLOSTAR) 100 UNIT/ML Solostar Pen; Inject 25 Units into the skin daily. -     insulin lispro (HUMALOG) 100 UNIT/ML KwikPen; Inject 0.06 mLs (6 Units total) into the skin 2 (two) times daily before a meal. Before two largest meals of the day -     Insulin Pen Needle (PEN NEEDLES) 29G X 12MM MISC; Use as directed -     TRUEplus Lancets 28G MISC; Use to measure blood sugar twice a day -     glucose blood (TRUE METRIX BLOOD GLUCOSE TEST) test strip; Use as instructed -     lisinopril (ZESTRIL) 2.5 MG tablet; Take 1 tablet (2.5 mg total) by mouth daily. -     pantoprazole (PROTONIX) 40 MG tablet; Take 1 tablet (40 mg total) by mouth daily.

## 2019-03-06 NOTE — Assessment & Plan Note (Signed)
Evidence for diabetic neuropathy in both lower extremities

## 2019-03-07 LAB — COMPREHENSIVE METABOLIC PANEL
ALT: 37 IU/L — ABNORMAL HIGH (ref 0–32)
AST: 34 IU/L (ref 0–40)
Albumin/Globulin Ratio: 1.3 (ref 1.2–2.2)
Albumin: 3.7 g/dL — ABNORMAL LOW (ref 3.8–4.8)
Alkaline Phosphatase: 123 IU/L — ABNORMAL HIGH (ref 39–117)
BUN/Creatinine Ratio: 20 (ref 9–23)
BUN: 9 mg/dL (ref 6–24)
Bilirubin Total: 0.3 mg/dL (ref 0.0–1.2)
CO2: 22 mmol/L (ref 20–29)
Calcium: 8.6 mg/dL — ABNORMAL LOW (ref 8.7–10.2)
Chloride: 101 mmol/L (ref 96–106)
Creatinine, Ser: 0.46 mg/dL — ABNORMAL LOW (ref 0.57–1.00)
GFR calc Af Amer: 140 mL/min/{1.73_m2} (ref >59)
GFR calc non Af Amer: 121 mL/min/{1.73_m2} (ref >59)
Globulin, Total: 2.9 g/dL (ref 1.5–4.5)
Glucose: 243 mg/dL — ABNORMAL HIGH (ref 65–99)
Potassium: 3.8 mmol/L (ref 3.5–5.2)
Sodium: 137 mmol/L (ref 134–144)
Total Protein: 6.6 g/dL (ref 6.0–8.5)

## 2019-03-07 LAB — CBC WITH DIFFERENTIAL/PLATELET
Basophils Absolute: 0 10*3/uL (ref 0.0–0.2)
Basos: 1 %
EOS (ABSOLUTE): 0.1 10*3/uL (ref 0.0–0.4)
Eos: 1 %
Hematocrit: 41.5 % (ref 34.0–46.6)
Hemoglobin: 13.5 g/dL (ref 11.1–15.9)
Immature Grans (Abs): 0 10*3/uL (ref 0.0–0.1)
Immature Granulocytes: 0 %
Lymphocytes Absolute: 1.2 10*3/uL (ref 0.7–3.1)
Lymphs: 27 %
MCH: 28.1 pg (ref 26.6–33.0)
MCHC: 32.5 g/dL (ref 31.5–35.7)
MCV: 86 fL (ref 79–97)
Monocytes Absolute: 0.2 10*3/uL (ref 0.1–0.9)
Monocytes: 6 %
Neutrophils Absolute: 2.8 10*3/uL (ref 1.4–7.0)
Neutrophils: 65 %
Platelets: 173 10*3/uL (ref 150–450)
RBC: 4.81 x10E6/uL (ref 3.77–5.28)
RDW: 12.9 % (ref 11.7–15.4)
WBC: 4.3 10*3/uL (ref 3.4–10.8)

## 2019-03-07 LAB — LIPID PANEL
Chol/HDL Ratio: 3.9 ratio (ref 0.0–4.4)
Cholesterol, Total: 134 mg/dL (ref 100–199)
HDL: 34 mg/dL — ABNORMAL LOW (ref >39)
LDL Chol Calc (NIH): 66 mg/dL (ref 0–99)
Triglycerides: 208 mg/dL — ABNORMAL HIGH (ref 0–149)
VLDL Cholesterol Cal: 34 mg/dL (ref 5–40)

## 2019-03-07 LAB — MICROALBUMIN, URINE: Microalbumin, Urine: 82 ug/mL

## 2019-03-07 LAB — TROPONIN I: Troponin I: <0.01 ng/mL (ref 0.00–0.04)

## 2019-03-11 ENCOUNTER — Telehealth: Payer: Self-pay | Admitting: Family Medicine

## 2019-03-11 NOTE — Telephone Encounter (Signed)
PCP: Dr. Enid Derry reached out to me regarding this patient. Apparently, she has established care with [facility].  I called her to clarify.  [place] interpreter ID#: [id number].  She confirmed that she is now a patient of [facility].  I advised her that I will take her off our patient panel list and she agreed with the plan.  No further questions.

## 2019-03-20 ENCOUNTER — Ambulatory Visit: Payer: Self-pay | Admitting: Pharmacist

## 2019-04-08 ENCOUNTER — Ambulatory Visit: Payer: Self-pay | Admitting: Registered"

## 2019-04-10 ENCOUNTER — Ambulatory Visit: Payer: No Typology Code available for payment source | Admitting: Podiatry

## 2019-04-16 ENCOUNTER — Ambulatory Visit (HOSPITAL_COMMUNITY)
Admission: RE | Admit: 2019-04-16 | Discharge: 2019-04-16 | Disposition: A | Discharge status: Home / Self Care | Payer: Self-pay | Source: Ambulatory Visit | Attending: Obstetrics and Gynecology | Admitting: Obstetrics and Gynecology

## 2019-04-16 ENCOUNTER — Other Ambulatory Visit: Payer: Self-pay

## 2019-04-16 ENCOUNTER — Other Ambulatory Visit (HOSPITAL_COMMUNITY): Payer: Self-pay | Admitting: *Deleted

## 2019-04-16 ENCOUNTER — Encounter (HOSPITAL_COMMUNITY): Payer: Self-pay

## 2019-04-16 ENCOUNTER — Ambulatory Visit: Payer: Self-pay

## 2019-04-16 DIAGNOSIS — Z01419 Encounter for gynecological examination (general) (routine) without abnormal findings: Secondary | ICD-10-CM | POA: Insufficient documentation

## 2019-04-16 DIAGNOSIS — N6452 Nipple discharge: Secondary | ICD-10-CM

## 2019-04-16 NOTE — Patient Instructions (Signed)
Explained breast self awareness with [facility]. Let patient know [facility] will cover Pap smears and HPV typing every 5 years unless has a history of abnormal Pap smears. Referred patient to the [facility] for a diagnostic mammogram and possible left breast ultrasound. Appointment scheduled for Thursday, May 02, 2019 at 1020. Patient aware of appointment and will be there. Let patient know will follow up with her within the next couple weeks with results of Pap smear by letter or phone. [name] verbalized understanding.  Devlyn Parish, Arvil Chaco, RN 3:26 PM

## 2019-04-16 NOTE — Progress Notes (Signed)
Complaints of bilateral breast pain around the time of her menstrual cycle x two years. Patient complained of bilateral breast discharge.   Pap Smear: Pap smear completed today. Last Pap smear was 08/20/2013 at [facility] and normal with negative HPV. Per patient has no history of an abnormal Pap smear. Last Pap smear result is in Epic.  Physical exam: Breasts Breasts symmetrical. No skin abnormalities bilateral breasts. No nipple retraction bilateral breasts. No nipple discharge right breast. Expressed a scant amount of clear discharge from the left breast on exam. Unable to express enough discharge to send sample to Cytology for evaluation. No lymphadenopathy. No lumps palpated bilateral breasts. No complaints of pain or tenderness on exam. Referred patient to the [facility] for a diagnostic mammogram and possible left breast ultrasound. Appointment scheduled for Thursday, May 02, 2019 at 1020.        Pelvic/Bimanual   Ext Genitalia No lesions, no swelling and no discharge observed on external genitalia.         Vagina Vagina pink and normal texture. No lesions or discharge observed in vagina.          Cervix Cervix is present. Cervix pink and of normal texture. No discharge observed.     Uterus Uterus is present and palpable. Uterus in normal position and normal size.        Adnexae Bilateral ovaries present and unable to palpate. No tenderness on palpation.         Rectovaginal No rectal exam completed today since patient had no rectal complaints. No skin abnormalities observed on exam.    Smoking History: Patient has never smoked.  Patient Navigation: Patient education provided. Access to services provided for patient through [facility] program. Spanish interpreter provided.   Breast and Cervical Cancer Risk Assessment: Patient has no known family history of breast cancer. Patient stated her mother has a history of a breast lump but patient not  sure if it was cancer. Patient has no known genetic mutations or history of radiation treatment to the chest before age 32. Patient has no history of cervical dysplasia, immunocompromised, or DES exposure in-utero.  Risk Assessment    Risk Scores      04/16/2019   Last edited by: Demetrius Revel, LPN   5-year risk: 0.4 %   Lifetime risk: 5.6 %         Used Spanish interpreter Rudene Anda from [place].

## 2019-04-17 ENCOUNTER — Encounter: Payer: Self-pay | Admitting: Family Medicine

## 2019-04-17 ENCOUNTER — Ambulatory Visit: Payer: Self-pay | Attending: Family Medicine | Admitting: Family Medicine

## 2019-04-17 ENCOUNTER — Ambulatory Visit: Payer: Self-pay

## 2019-04-17 VITALS — BP 122/82 | HR 70 | Resp 16 | Wt 223.0 lb

## 2019-04-17 DIAGNOSIS — K219 Gastro-esophageal reflux disease without esophagitis: Secondary | ICD-10-CM

## 2019-04-17 DIAGNOSIS — Z758 Other problems related to medical facilities and other health care: Secondary | ICD-10-CM

## 2019-04-17 DIAGNOSIS — H538 Other visual disturbances: Secondary | ICD-10-CM

## 2019-04-17 DIAGNOSIS — E1142 Type 2 diabetes mellitus with diabetic polyneuropathy: Secondary | ICD-10-CM

## 2019-04-17 DIAGNOSIS — E1165 Type 2 diabetes mellitus with hyperglycemia: Secondary | ICD-10-CM

## 2019-04-17 DIAGNOSIS — R1013 Epigastric pain: Secondary | ICD-10-CM

## 2019-04-17 DIAGNOSIS — Z794 Long term (current) use of insulin: Secondary | ICD-10-CM

## 2019-04-17 DIAGNOSIS — B351 Tinea unguium: Secondary | ICD-10-CM

## 2019-04-17 DIAGNOSIS — K625 Hemorrhage of anus and rectum: Secondary | ICD-10-CM

## 2019-04-17 DIAGNOSIS — Z789 Other specified health status: Secondary | ICD-10-CM

## 2019-04-17 DIAGNOSIS — Z603 Acculturation difficulty: Secondary | ICD-10-CM

## 2019-04-17 LAB — CYTOLOGY - PAP
Adequacy: ABSENT
Comment: NEGATIVE
Diagnosis: NEGATIVE
High risk HPV: NEGATIVE

## 2019-04-17 LAB — GLUCOSE, POCT (MANUAL RESULT ENTRY): POC Glucose: 109 mg/dL — AB (ref 70–99)

## 2019-04-17 MED ORDER — TRUE METRIX BLOOD GLUCOSE TEST VI STRP: ORAL_STRIP | 12 refills | Status: DC

## 2019-04-17 MED ORDER — METFORMIN HCL ER 500 MG PO TB24: 1000.0000 mg | ORAL_TABLET | Freq: Two times a day (BID) | ORAL | 4 refills | Status: DC

## 2019-04-17 MED ORDER — OMEPRAZOLE 40 MG PO CPDR: 40.0000 mg | DELAYED_RELEASE_CAPSULE | Freq: Two times a day (BID) | ORAL | 3 refills | Status: DC

## 2019-04-17 MED FILL — HUMALOG 100 UNITS/ML KWIKPE: 100 | 25 days supply | Qty: 3 | Fill #1

## 2019-04-17 MED FILL — TRUEplus LANCETS 28G MISC: 50 days supply | Qty: 100 | Fill #0

## 2019-04-17 MED FILL — !LANTUS SOLOSTAR 100UNITS/M: 100 | 24 days supply | Qty: 6 | Fill #1

## 2019-04-17 MED FILL — TRUE METRIX TEST STRIP: 25 days supply | Qty: 100 | Fill #0

## 2019-04-17 MED FILL — metFORMIN HCL ER 500 MG TB2: 500 | 30 days supply | Qty: 120 | Fill #0

## 2019-04-17 MED FILL — OMEPRAZOLE DR 40 MG CAPSULE: 40 | 30 days supply | Qty: 60 | Fill #0

## 2019-04-17 NOTE — Progress Notes (Signed)
Subjective:  Patient ID: [name], female    DOB: Jul 29, 1975  Age: 44 y.o. MRN: [id number]  CC: Establish Care and Diabetes  Due to language barrier, Stratus video interpretation system used at today's visit  HPI [name], 44 yo Hispanic female, who is new to me as a patient but has been seen by another provider at this practice with most recent visit on 03/06/2019.  She is status post well female exam and Pap smear with GYN yesterday.  Patient reports that her blood sugars are improved and she has her glucometer with her and average BS over the past 30 days has been 145. She needs refills of test strips and metformin.  She reports no increased thirst and no urinary frequency at this time.  She admits that she has had blurred vision which occurs off and on and has been going on for months.  She also has some occasional sensation of numbness in her toes.  She has also had some pain in the right great toenail and sometimes on the left.         She reports that she has still taking the reflux medication prescribed at her last visit.  She feels that this is not working well.  She continues to have a burning sensation in her mid chest and upper abdomen.  She has also on review of systems, noticed blood in the stool.  She has seen blood that is streaked along the stool as well as blood in the toilet and blood when wiping after bowel movement.  She denies any issues with constipation.  She denies any black or tarry stools.  She reports that this has also been going on off and on for more than a year.  Blood that she sees is bright red and she usually does not have any pain with her bowel movements.  She occasionally has some right lower abdominal pain which is dull and crampy.  She denies any pain at today's visit.  She denies left-sided chest pain, no shortness of breath or cough, no nausea/vomiting/diarrhea or constipation.  No current muscle or joint pain.  No fever or chills.  Past  Medical History:  Diagnosis Date  . Chronic chest pain   . Chronic pelvic pain in female   . DDD (degenerative disc disease), cervical   . Depression   . Diabetes mellitus   . Dizziness   . Fatty liver   . GERD (gastroesophageal reflux disease)   . H/O echocardiogram 09/2013   normal  . Headache   . Hypertension   . Peripheral neuropathy   . Renal disorder     Past Surgical History:  Procedure Laterality Date  . CESAREAN SECTION     x 3  . CHOLECYSTECTOMY N/A 04/10/2015   Procedure: LAPAROSCOPIC CHOLECYSTECTOMY ;  Surgeon: Coralie Keens, MD;  Location: [facility];  Service: General;  Laterality: N/A;  . COLONOSCOPY N/A 04/07/2014   WUJ:[id number] HH/EPIGASTRIC PAIN/SMALL INTERNAL HEMORRHOIDS/MILD DIVERTICULOSIS IN LEFT COLON  . ESOPHAGOGASTRODUODENOSCOPY N/A 04/07/2014   YNW:GNFAO HH/EPIGASTRIC PAIN/SMALL INTERNAL HEMORRHOIDS/MILD DIVERTICULOSIS IN LEFT COLON  . ROTATOR CUFF REPAIR Right   . TUBAL LIGATION      Family History  Problem Relation Age of Onset  . Hypertension Mother   . Diabetes Father   . Hypertension Sister   . Diabetes Sister   . Diabetes Daughter   . Pancreatitis Daughter   . Migraines Daughter   . Asthma Daughter   . Colon cancer Neg Hx  Social History   Tobacco Use  . Smoking status: Never Smoker  . Smokeless tobacco: Never Used  Substance Use Topics  . Alcohol use: No    ROS Review of Systems  Constitutional: Positive for fatigue (mild). Negative for chills and fever.  HENT: Negative for sore throat and trouble swallowing.   Eyes: Positive for visual disturbance (blurred vision). Negative for photophobia.  Respiratory: Negative for cough and shortness of breath.   Cardiovascular: Positive for palpitations (occasional but was told that it is related to reflux). Negative for chest pain.  Gastrointestinal: Positive for abdominal pain (epigastric pain), anal bleeding and blood in stool. Negative for constipation, diarrhea and nausea.  Endocrine:  Negative for polydipsia, polyphagia and polyuria.  Genitourinary: Negative for dysuria and frequency.  Musculoskeletal: Negative for arthralgias and back pain.  Skin:       Pain in the right great toenail  Neurological: Positive for numbness (numbness in the left lower leg (hx of sciatica) and numbness and tingling in the feet). Negative for dizziness and headaches.  Hematological: Negative for adenopathy. Does not bruise/bleed easily.  Psychiatric/Behavioral: Negative for self-injury and suicidal ideas.    Objective:   Today's Vitals: BP 122/82   Pulse 70   Resp 16   Wt 223 lb (101.2 kg)   LMP 03/24/2019 (Exact Date)   SpO2 100%   BMI 45.04 kg/m   Physical Exam Vitals and nursing note reviewed.  Constitutional:      General: She is not in acute distress.    Appearance: Normal appearance. She is obese.  Neck:     Vascular: No carotid bruit.  Cardiovascular:     Rate and Rhythm: Normal rate and regular rhythm.     Pulses:          Dorsalis pedis pulses are 1+ on the right side and 1+ on the left side.       Posterior tibial pulses are 1+ on the right side and 1+ on the left side.  Pulmonary:     Effort: Pulmonary effort is normal.     Breath sounds: Normal breath sounds.  Abdominal:     Palpations: Abdomen is soft.     Tenderness: There is abdominal tenderness. There is no right CVA tenderness, left CVA tenderness, guarding or rebound.     Comments: Epigastric tenderness to palpation, no rebound or guarding  Musculoskeletal:        General: No tenderness.     Cervical back: Normal range of motion and neck supple.     Right lower leg: No edema.     Left lower leg: No edema.     Right foot: Normal range of motion. No Charcot foot or foot drop.     Left foot: Normal range of motion. No Charcot foot or foot drop.  Feet:     Right foot:     Protective Sensation: 10 sites tested. 10 sites sensed.     Skin integrity: No ulcer or skin breakdown.     Toenail Condition: Right  toenails are abnormally thick. Fungal disease present.    Left foot:     Protective Sensation: 10 sites tested. 10 sites sensed.     Skin integrity: No ulcer or skin breakdown.     Toenail Condition: Left toenails are abnormally thick. Fungal disease present. Lymphadenopathy:     Cervical: No cervical adenopathy.  Skin:    General: Skin is warm and dry.  Neurological:     General: No focal deficit present.  Mental Status: She is alert and oriented to person, place, and time.  Psychiatric:        Mood and Affect: Mood normal.        Behavior: Behavior normal.     Assessment & Plan:  1. Uncontrolled type 2 diabetes mellitus with hyperglycemia, with long-term current use of insulin ([place]); 6.  Blurred vision, bilateral At her new patient visit on 03/06/2019 patient with glucose of 305 and hemoglobin A1c of 11.8.  At today's visit, she reports that her blood sugars are greatly improved.  On review of patient's glucometer, she has a 30-day average blood sugar of 145 showing improved control of diabetes and she denies any significant issues with hypoglycemia.  She also has complaint of blurred vision and will be referred to ophthalmology for further evaluation and eye exam.  I suspect that her blurred vision is most likely related to her elevated blood sugars in the past and now with decrease in blood sugars which may have caused some changes in the shape of the lens which should hopefully normalize as her blood sugars remain controlled.  She is provided with refills of strips and Glucophage per her request.  She also has complaint of painful toenails due to fungal infection and podiatry referral is being placed.  She complains of neuropathic type pain but has normal monofilament exam.  She also reports history of back pain with radiation which could be contributing to her sensation of neuropathy.  At her next visit, she may need vitamin B12 level if this has not been recently done as vitamin B 12  can be lowered by the use of metformin and this can cause numbness/tingling. - POCT glucose (manual entry) - CBC - metFORMIN (GLUCOPHAGE XR) 500 MG 24 hr tablet; Take 2 tablets (1,000 mg total) by mouth 2 (two) times daily with a meal.  Dispense: 120 tablet; Refill: 4 - Ambulatory referral to Ophthalmology - glucose blood (TRUE METRIX BLOOD GLUCOSE TEST) test strip; Use as instructed to check blood sugar up to 3 times per day  Dispense: 100 each; Refill: 12  2. Diabetic peripheral neuropathy associated with type 2 diabetes mellitus ([place]) 7.  Onychomycosis of right great toe; pain in toenail of right great toe Podiatry referral placed for foot and nail care - Ambulatory referral to Podiatry  3. Bright red rectal bleeding GI referral secondary to complaint of recurrent bright red rectal bleeding and will check CBC to look for signs of anemia/blood loss - CBC - Ambulatory referral to Gastroenterology  4. Epigastric pain Referral to gastroenterology and will check lipase.  Patient reports that pantoprazole 40 mg once daily was not effective and prescription provided for omeprazole 40 mg twice daily as she states that omeprazole did help in the past.  She was also advised to avoid foods that tend to trigger her reflux symptoms, avoid nonsteroidal anti-inflammatories and avoid eating within 2 hours of bedtime. - Ambulatory referral to Gastroenterology - omeprazole (PRILOSEC) 40 MG capsule; Take 1 capsule (40 mg total) by mouth 2 (two) times daily. For acid reflux  Dispense: 60 capsule; Refill: 3 - Lipase  5. Gastroesophageal reflux disease, unspecified whether esophagitis present Handout provided on GERD/GERD diet as part of after visit summary.  PPI change from Protonix 40 mg to omeprazole 40 mg twice daily and patient will follow-up with gastroenterology for further evaluation and treatment.  Avoid known trigger foods, avoid nonsteroidal anti-inflammatories and avoid eating within 2 hours of  bedtime. - omeprazole (PRILOSEC) 40  MG capsule; Take 1 capsule (40 mg total) by mouth 2 (two) times daily. For acid reflux  Dispense: 60 capsule; Refill: 3  8.  Language barrier Stratus video interpretation system used at today's visit to help with communication of health information due to a language barrier  Outpatient Encounter Medications as of 04/17/2019  Medication Sig  . blood glucose meter kit and supplies KIT Dispense based on patient and insurance preference. Use up to four times daily as directed. (FOR ICD-9 250.00, 250.01).  . Blood Glucose Monitoring Suppl (TRUE METRIX METER) w/Device KIT Use to measure blood sugar twice a day  . glucose blood (TRUE METRIX BLOOD GLUCOSE TEST) test strip Use as instructed  . ibuprofen (ADVIL,MOTRIN) 200 MG tablet Take 400 mg by mouth every 6 (six) hours as needed.  . Insulin Glargine (LANTUS SOLOSTAR) 100 UNIT/ML Solostar Pen Inject 25 Units into the skin daily.  . insulin lispro (HUMALOG) 100 UNIT/ML KwikPen Inject 0.06 mLs (6 Units total) into the skin 2 (two) times daily before a meal. Before two largest meals of the day  . Insulin Pen Needle (PEN NEEDLES) 29G X 12MM MISC Use as directed  . lisinopril (ZESTRIL) 2.5 MG tablet Take 1 tablet (2.5 mg total) by mouth daily.  . metFORMIN (GLUCOPHAGE XR) 500 MG 24 hr tablet Take 2 tablets (1,000 mg total) by mouth 2 (two) times daily with a meal.  . pantoprazole (PROTONIX) 40 MG tablet Take 1 tablet (40 mg total) by mouth daily.  . TRUEplus Lancets 28G MISC Use to measure blood sugar twice a day   No facility-administered encounter medications on file as of 04/17/2019.    An After Visit Summary was printed and given to the patient.   Follow-up: Return in about 8 weeks (around 06/12/2019) for DM/abdominal pain.    Antony Blackbird MD

## 2019-04-17 NOTE — Patient Instructions (Signed)
Sangrado rectal (Rectal Bleeding) Se llama hemorragia rectal al sangrado que aparece por la abertura de las nalgas (ano). Las personas con este tipo de sangrado pueden observar sangre de color rojo brillante en su ropa interior o en el inodoro despus de defecar . Adems, tener heces (materia fecal) de color rojo oscuro o negro. El sangrado rectal indica que hay algn problema. El control del sangrado debe estar a cargo de un mdico. CUIDADOS EN EL [employer] la afeccin para [place] cambio. Para aliviar el sangrado y las [place], tome las siguientes medidas:  Coma alimentos con alto contenido de [place]. Esto mantendr las heces blandas para que le resulte ms fcil defecar sin Database administrator. Pregntele el mdico qu alimentos y bebidas tienen alto contenido de [place].  Beba suficiente lquido para mantener el pis (orina) claro o de color amar[place] plido. Esto tambin ayuda a [employer].  Intente tomar un bao con agua caliente. Esto puede Best boy.  Concurra a todas las visitas de control como se lo haya indicado el mdico. Esto es importante. SOLICITE AYUDA DE INMEDIATO SI:  Tiene un nuevo sangrado.  Tiene ms sangrado que antes.  La materia fecal es negra o de color rojo oscuro.  Vomita sangre o una sustancia parecida a los granos de [place].  Tiene dolor o molestias en el vientre (abdomen).  Tiene fiebre.  Se siente dbil.  Siente malestar estomacal (nuseas).  Pierde el conocimiento (se desmaya).  Siente un dolor muy intenso en el ano.  No puede defecar. Esta informacin no tiene Marine scientist el consejo del mdico. Asegrese de hacerle al mdico cualquier pregunta que tenga. Document Revised: 03/31/2016 Document Reviewed: 04/26/2015 Elsevier Patient Education  2020 [employer].

## 2019-04-18 LAB — CBC
Hematocrit: 40 % (ref 34.0–46.6)
Hemoglobin: 13.5 g/dL (ref 11.1–15.9)
MCH: 28.5 pg (ref 26.6–33.0)
MCHC: 33.8 g/dL (ref 31.5–35.7)
MCV: 85 fL (ref 79–97)
Platelets: 236 x10E3/uL (ref 150–450)
RBC: 4.73 x10E6/uL (ref 3.77–5.28)
RDW: 12.7 % (ref 11.7–15.4)
WBC: 8.6 x10E3/uL (ref 3.4–10.8)

## 2019-04-18 LAB — LIPASE: Lipase: 28 U/L (ref 14–72)

## 2019-04-19 MED FILL — TRUEPLUS PEN NDL 31GX5/16": 31G X 8 MM | 25 days supply | Qty: 100 | Fill #1

## 2019-04-19 MED FILL — TRUEPLUS PEN NDL 31GX5/16: 31G X 8 MM | 25 days supply | Qty: 100 | Fill #1

## 2019-04-29 ENCOUNTER — Ambulatory Visit: Payer: Self-pay | Attending: Family Medicine | Admitting: Family Medicine

## 2019-04-29 ENCOUNTER — Encounter: Payer: Self-pay | Admitting: Family Medicine

## 2019-04-29 ENCOUNTER — Emergency Department (HOSPITAL_COMMUNITY)
Admission: EM | Admit: 2019-04-29 | Discharge: 2019-04-29 | Disposition: A | Discharge status: Home / Self Care | Payer: Self-pay | Attending: Emergency Medicine | Admitting: Emergency Medicine

## 2019-04-29 ENCOUNTER — Emergency Department (HOSPITAL_COMMUNITY): Payer: Self-pay

## 2019-04-29 ENCOUNTER — Other Ambulatory Visit: Payer: Self-pay

## 2019-04-29 ENCOUNTER — Telehealth: Payer: Self-pay

## 2019-04-29 DIAGNOSIS — E1140 Type 2 diabetes mellitus with diabetic neuropathy, unspecified: Secondary | ICD-10-CM | POA: Insufficient documentation

## 2019-04-29 DIAGNOSIS — Z794 Long term (current) use of insulin: Secondary | ICD-10-CM | POA: Insufficient documentation

## 2019-04-29 DIAGNOSIS — R079 Chest pain, unspecified: Secondary | ICD-10-CM | POA: Insufficient documentation

## 2019-04-29 DIAGNOSIS — E119 Type 2 diabetes mellitus without complications: Secondary | ICD-10-CM | POA: Insufficient documentation

## 2019-04-29 DIAGNOSIS — I10 Essential (primary) hypertension: Secondary | ICD-10-CM | POA: Insufficient documentation

## 2019-04-29 DIAGNOSIS — Z20822 Contact with and (suspected) exposure to covid-19: Secondary | ICD-10-CM | POA: Insufficient documentation

## 2019-04-29 DIAGNOSIS — Z79899 Other long term (current) drug therapy: Secondary | ICD-10-CM | POA: Insufficient documentation

## 2019-04-29 LAB — TROPONIN I (HIGH SENSITIVITY)
Troponin I (High Sensitivity): 3 ng/L (ref <18)
Troponin I (High Sensitivity): 5 ng/L (ref <18)

## 2019-04-29 LAB — CBC
HCT: 39.2 % (ref 36.0–46.0)
Hemoglobin: 13 g/dL (ref 12.0–15.0)
MCH: 28.1 pg (ref 26.0–34.0)
MCHC: 33.2 g/dL (ref 30.0–36.0)
MCV: 84.8 fL (ref 80.0–100.0)
Platelets: 263 10*3/uL (ref 150–400)
RBC: 4.62 MIL/uL (ref 3.87–5.11)
RDW: 12.8 % (ref 11.5–15.5)
WBC: 7.2 10*3/uL (ref 4.0–10.5)
nRBC: 0 % (ref 0.0–0.2)

## 2019-04-29 LAB — BASIC METABOLIC PANEL
Anion gap: 11 (ref 5–15)
BUN: 7 mg/dL (ref 6–20)
CO2: 22 mmol/L (ref 22–32)
Calcium: 9 mg/dL (ref 8.9–10.3)
Chloride: 105 mmol/L (ref 98–111)
Creatinine, Ser: 0.52 mg/dL (ref 0.44–1.00)
GFR calc Af Amer: >60 mL/min (ref >60)
GFR calc non Af Amer: >60 mL/min (ref >60)
Glucose, Bld: 110 mg/dL — ABNORMAL HIGH (ref 70–99)
Potassium: 3.8 mmol/L (ref 3.5–5.1)
Sodium: 138 mmol/L (ref 135–145)

## 2019-04-29 LAB — POC SARS CORONAVIRUS 2 AG -  ED: SARS Coronavirus 2 Ag: NEGATIVE

## 2019-04-29 LAB — I-STAT BETA HCG BLOOD, ED (MC, WL, AP ONLY): I-stat hCG, quantitative: <5 m[IU]/mL (ref <5)

## 2019-04-29 MED ORDER — ALUM & MAG HYDROXIDE-SIMETH 200-200-20 MG/5ML PO SUSP
30.0000 mL | Freq: Once | ORAL | Status: AC
  Administered 2019-04-29: 30 mL via ORAL
  Filled 2019-04-29: qty 30

## 2019-04-29 MED ORDER — SODIUM CHLORIDE 0.9% FLUSH: 3.0000 mL | Freq: Once | INTRAVENOUS | Status: DC

## 2019-04-29 NOTE — ED Notes (Signed)
Discharge instructions discussed with pt. Pt verbalized understanding with no questions at this time. Pt to go home with family.  °

## 2019-04-29 NOTE — Discharge Instructions (Addendum)
See your Physician for recheck.  Return if any problems.  °

## 2019-04-29 NOTE — ED Provider Notes (Signed)
[facility] EMERGENCY DEPARTMENT Provider Note   CSN: [id number] Arrival date & time: 04/29/19  1520     History No chief complaint on file.   [name] is a 44 y.o. female.  Pt complains of chest pain. Pt reports she has pain in her legs from diabetes.  Pt reports she began having pain on Saturday.  Pt took aspirin with relief from pain.  Pt reports she has pain in her chest frequently.  Pt had a covid exposure last month Pt denies fever or chills, Pt reports increased pain with breathing.  Pt denies shortness of breath   The history is provided by the patient. No language interpreter was used.       Past Medical History:  Diagnosis Date  . Chronic chest pain   . Chronic pelvic pain in female   . DDD (degenerative disc disease), cervical   . Depression   . Diabetes mellitus   . Dizziness   . Fatty liver   . GERD (gastroesophageal reflux disease)   . H/O echocardiogram 09/2013   normal  . Headache   . Hypertension   . Peripheral neuropathy   . Renal disorder     Patient Active Problem List   Diagnosis Date Noted  . Well woman exam with routine gynecological exam 04/16/2019  . Breast discharge 04/16/2019  . Toenail fungus 10/26/2016  . Uncontrolled type 2 diabetes mellitus with hyperglycemia, with long-term current use of insulin ([place]) 10/21/2016  . Chest pain 04/25/2014  . Diabetic neuropathy ([place]) 04/25/2014  . Anemia 12/21/2012  . Seasonal allergies 07/26/2012  . Hepatic steatosis 10/01/2011  . Depression 05/26/2011  . Hypertension 05/26/2011  . GERD 09/17/2008  . Morbid obesity ([place]) 07/23/2008    Past Surgical History:  Procedure Laterality Date  . CESAREAN SECTION     x 3  . CHOLECYSTECTOMY N/A 04/10/2015   Procedure: LAPAROSCOPIC CHOLECYSTECTOMY ;  Surgeon: Coralie Keens, MD;  Location: [facility];  Service: General;  Laterality: N/A;  . COLONOSCOPY N/A 04/07/2014   LYY:TKPTW HH/EPIGASTRIC PAIN/SMALL INTERNAL  HEMORRHOIDS/MILD DIVERTICULOSIS IN LEFT COLON  . ESOPHAGOGASTRODUODENOSCOPY N/A 04/07/2014   SFK:CLEXN HH/EPIGASTRIC PAIN/SMALL INTERNAL HEMORRHOIDS/MILD DIVERTICULOSIS IN LEFT COLON  . ROTATOR CUFF REPAIR Right   . TUBAL LIGATION       OB History    Gravida  6   Para  3   Term  3   Preterm      AB      Living  3     SAB      TAB      Ectopic      Multiple      Live Births              Family History  Problem Relation Age of Onset  . Hypertension Mother   . Diabetes Father   . Hypertension Sister   . Diabetes Sister   . Diabetes Daughter   . Pancreatitis Daughter   . Migraines Daughter   . Asthma Daughter   . Colon cancer Neg Hx     Social History   Tobacco Use  . Smoking status: Never Smoker  . Smokeless tobacco: Never Used  Substance Use Topics  . Alcohol use: No  . Drug use: No    Home Medications Prior to Admission medications   Medication Sig Start Date End Date Taking? Authorizing Provider  blood glucose meter kit and supplies KIT Dispense based on patient and insurance preference. Use up to four  times daily as directed. (FOR ICD-9 250.00, 250.01). 08/11/16   Marina Goodell A, MD  Blood Glucose Monitoring Suppl (TRUE METRIX METER) w/Device KIT Use to measure blood sugar twice a day 02/26/19   Elsie Stain, MD  glucose blood (TRUE METRIX BLOOD GLUCOSE TEST) test strip Use as instructed to check blood sugar up to 3 times per day 04/17/19   Fulp, Cammie, MD  ibuprofen (ADVIL,MOTRIN) 200 MG tablet Take 400 mg by mouth every 6 (six) hours as needed.    [provider]  Insulin Glargine (LANTUS SOLOSTAR) 100 UNIT/ML Solostar Pen Inject 25 Units into the skin daily. 03/06/19   Elsie Stain, MD  insulin lispro (HUMALOG) 100 UNIT/ML KwikPen Inject 0.06 mLs (6 Units total) into the skin 2 (two) times daily before a meal. Before two largest meals of the day 03/06/19   Elsie Stain, MD  Insulin Pen Needle (PEN NEEDLES) 29G X 12MM MISC  Use as directed 03/06/19   Elsie Stain, MD  lisinopril (ZESTRIL) 2.5 MG tablet Take 1 tablet (2.5 mg total) by mouth daily. 03/06/19   Elsie Stain, MD  metFORMIN (GLUCOPHAGE XR) 500 MG 24 hr tablet Take 2 tablets (1,000 mg total) by mouth 2 (two) times daily with a meal. 04/17/19   Fulp, Cammie, MD  omeprazole (PRILOSEC) 40 MG capsule Take 1 capsule (40 mg total) by mouth 2 (two) times daily. For acid reflux 04/17/19   Fulp, Cammie, MD  TRUEplus Lancets 28G MISC Use to measure blood sugar twice a day 03/06/19   Elsie Stain, MD    Allergies    Patient has no known allergies.  Review of Systems   Review of Systems  All other systems reviewed and are negative.   Physical Exam Updated Vital Signs BP 109/67   Pulse 68   Temp 98.2 F (36.8 C) (Oral)   Resp 12   Ht 4' 11" (1.499 m)   Wt 99.8 kg   SpO2 100%   BMI 44.43 kg/m   Physical Exam Vitals and nursing note reviewed.  Constitutional:      Appearance: She is well-developed.  HENT:     Head: Normocephalic.     Nose: Nose normal.  Cardiovascular:     Rate and Rhythm: Normal rate and regular rhythm.     Pulses: Normal pulses.  Pulmonary:     Effort: Pulmonary effort is normal.  Abdominal:     General: Abdomen is flat. There is no distension.  Musculoskeletal:        General: Normal range of motion.     Cervical back: Normal range of motion.  Skin:    General: Skin is warm.  Neurological:     General: No focal deficit present.     Mental Status: She is alert and oriented to person, place, and time.  Psychiatric:        Mood and Affect: Mood normal.     ED Results / Procedures / Treatments   Labs (all labs ordered are listed, but only abnormal results are displayed) Labs Reviewed  BASIC METABOLIC PANEL - Abnormal; Notable for the following components:      Result Value   Glucose, Bld 110 (*)    All other components within normal limits  CBC  I-STAT BETA HCG BLOOD, ED ([facility], [facility], [facility] ONLY)  POC SARS  CORONAVIRUS 2 AG -  ED  TROPONIN I (HIGH SENSITIVITY)  TROPONIN I (HIGH SENSITIVITY)    EKG EKG Interpretation  Date/Time:  Monday April 29 2019 15:25:57 EST Ventricular Rate:  76 PR Interval:  122 QRS Duration: 76 QT Interval:  384 QTC Calculation: 432 R Axis:   54 Text Interpretation: Normal sinus rhythm Low voltage QRS Cannot rule out Anterior infarct , age undetermined Abnormal ECG Confirmed by Veryl Speak [phone]) on 04/29/2019 6:41:48 PM   Radiology DG Chest 2 View  Result Date: 04/29/2019 CLINICAL DATA:  Centralized chest pain radiating to back EXAM: CHEST - 2 VIEW COMPARISON:  05/03/2014 FINDINGS: The heart size and mediastinal contours are within normal limits. Both lungs are clear. The visualized skeletal structures are unremarkable. IMPRESSION: No active cardiopulmonary disease. Electronically Signed   By: Randa Ngo M.D.   On: 04/29/2019 16:05    Procedures Procedures (including critical care time)  Medications Ordered in ED Medications  sodium chloride flush (NS) 0.9 % injection 3 mL (3 mLs Intravenous Not Given 04/29/19 1839)    ED Course  I have reviewed the triage vital signs and the nursing notes.  Pertinent labs & imaging results that were available during my care of the patient were reviewed by me and considered in my medical decision making (see chart for details).    MDM Rules/Calculators/A&P                      MDM: EKg  No acute changes.  Troponin negative x 2.  Pt has a history of chronic chest pain.  covid is negative.   Final Clinical Impression(s) / ED Diagnoses Final diagnoses:  Nonspecific chest pain    Rx / DC Orders ED Discharge Orders    None    An After Visit Summary was printed and given to the patient.    Sidney Ace 04/29/19 2054    Veryl Speak, MD 04/29/19 2233

## 2019-04-29 NOTE — Progress Notes (Signed)
Patient verified DOB Interpreter Name: [name] Interpreter #: [phone].  Patient has taken medication today. Patient has eaten today. Patient complains of bilateral leg pain being scaled at a 10 beginning a week ago. Patient states legs are tender if she tries to massage. Patient states walking is difficult and her feet shake. Patient complains of upper lip pain with spasms since Saturday. CBG today before eating was 132.

## 2019-04-29 NOTE — Telephone Encounter (Signed)
Patient verified DOB Interpreter Name: [name] Interpreter #: [phone].  Patient has taken medication today. Patient has eaten today. Patient complains of bilateral leg pain being scaled at a 10 beginning a week ago. Patient states legs are tender if she tries to massage. Patient states walking is difficult and her feet shake. Patient complains of upper lip pain with spasms since Saturday. CBG today before eating was 132.   Called patient to verify experiencing symptoms as suggested by CMA.  Pt states had 2 episodes of  numbness of the left arm and lip associated with localized chest pains on and off. Denies any chest pain or difficulty breathing during our conversation. Denies loss of conciseness.  Stated feeling tired and her legs shake.  Based on  experienced symptoms instructed patient to go to the ED for further evaluation. Verbalized understanding/ Pt agreed to go to Ed immediately. Stated daughter will take her.

## 2019-04-29 NOTE — ED Triage Notes (Addendum)
Pt here with C/O centralized CP that radiates to left shoulder and back. Pt states it began Saturday while she was cleaning house.. Pt also endorses SHOB that began Saturday. Pt self administered 324 ASA PTA.  Pt states her daughter was positive for Covid a month ago. Pt states she was tested 2 times since her daughter tested positive and both were negative.

## 2019-04-30 NOTE — Progress Notes (Signed)
Patient ID: [name], female   DOB: [birth year], 44 y.o.   MRN: [id number]  Patient was contacted by Triage RN and as patient with complaints of chest pain with radiation to the left arm, patient was advised to go to the ED for further evaluation and treatment.

## 2019-05-02 ENCOUNTER — Other Ambulatory Visit: Payer: Self-pay

## 2019-05-09 ENCOUNTER — Other Ambulatory Visit: Payer: Self-pay | Admitting: Obstetrics and Gynecology

## 2019-05-09 DIAGNOSIS — N6452 Nipple discharge: Secondary | ICD-10-CM

## 2019-05-14 ENCOUNTER — Telehealth: Payer: Self-pay | Admitting: Pediatric Intensive Care

## 2019-05-14 NOTE — Telephone Encounter (Signed)
Call to client with [facility] Interpretation [phone]. Client ID x2. Client needs transportation to appointment at [facility] tomorrow. CN explained ride process. Client consents to using service. CN advised client that if she has not received a text from transportation services by 1030 to text CN.  Lisette Abu RN BSN CNP [phone]

## 2019-05-15 ENCOUNTER — Telehealth: Payer: Self-pay | Admitting: Family Medicine

## 2019-05-15 ENCOUNTER — Ambulatory Visit: Payer: Self-pay | Admitting: Family Medicine

## 2019-05-15 ENCOUNTER — Other Ambulatory Visit: Payer: Self-pay

## 2019-05-15 MED FILL — ?BASAGLAR 100 UNITS/ML KWPE: 100 | 24 days supply | Qty: 6 | Fill #2

## 2019-05-15 MED FILL — ?HUMALOG 100 UNITS/ML KWIKP: 100 | 25 days supply | Qty: 3 | Fill #2

## 2019-05-15 MED FILL — LISINOPRIL 2.5 MG TABLET: 2.5 | 30 days supply | Qty: 30 | Fill #0

## 2019-05-15 NOTE — Telephone Encounter (Signed)
Pt was informed to pick up her letter

## 2019-05-15 NOTE — Telephone Encounter (Signed)
Patient came in requesting a copy of her CAFA letter that she lost. Letter is not in the system. Please f/u

## 2019-05-22 ENCOUNTER — Ambulatory Visit: Payer: Self-pay | Attending: Family Medicine | Admitting: Physician Assistant

## 2019-05-22 ENCOUNTER — Ambulatory Visit
Admission: RE | Admit: 2019-05-22 | Discharge: 2019-05-22 | Disposition: A | Discharge status: Home / Self Care | Payer: No Typology Code available for payment source | Source: Ambulatory Visit | Attending: Obstetrics and Gynecology | Admitting: Obstetrics and Gynecology

## 2019-05-22 ENCOUNTER — Other Ambulatory Visit: Payer: Self-pay

## 2019-05-22 ENCOUNTER — Ambulatory Visit
Admission: RE | Admit: 2019-05-22 | Discharge: 2019-05-22 | Disposition: A | Discharge status: Home / Self Care | Payer: Self-pay | Source: Ambulatory Visit | Attending: Obstetrics and Gynecology | Admitting: Obstetrics and Gynecology

## 2019-05-22 VITALS — BP 139/84 | HR 77 | Temp 97.7°F | Resp 16 | Wt 228.4 lb

## 2019-05-22 DIAGNOSIS — E1165 Type 2 diabetes mellitus with hyperglycemia: Secondary | ICD-10-CM

## 2019-05-22 DIAGNOSIS — Z789 Other specified health status: Secondary | ICD-10-CM

## 2019-05-22 DIAGNOSIS — Z794 Long term (current) use of insulin: Secondary | ICD-10-CM

## 2019-05-22 DIAGNOSIS — R202 Paresthesia of skin: Secondary | ICD-10-CM

## 2019-05-22 DIAGNOSIS — R110 Nausea: Secondary | ICD-10-CM

## 2019-05-22 DIAGNOSIS — N6452 Nipple discharge: Secondary | ICD-10-CM

## 2019-05-22 DIAGNOSIS — R102 Pelvic and perineal pain: Secondary | ICD-10-CM

## 2019-05-22 LAB — POCT URINALYSIS DIP (CLINITEK)
Bilirubin, UA: NEGATIVE
Blood, UA: NEGATIVE
Glucose, UA: 250 mg/dL — AB
Ketones, POC UA: NEGATIVE mg/dL
Leukocytes, UA: NEGATIVE
Nitrite, UA: NEGATIVE
POC PROTEIN,UA: NEGATIVE
Spec Grav, UA: 1.025 (ref 1.010–1.025)
Urobilinogen, UA: 0.2 E.U./dL
pH, UA: 6.5 (ref 5.0–8.0)

## 2019-05-22 LAB — POCT URINE PREGNANCY: Preg Test, Ur: NEGATIVE

## 2019-05-22 LAB — GLUCOSE, POCT (MANUAL RESULT ENTRY): POC Glucose: 148 mg/dl — AB (ref 70–99)

## 2019-05-22 MED ORDER — FLUCONAZOLE 150 MG PO TABS: ORAL_TABLET | ORAL | 0 refills | Status: DC

## 2019-05-22 MED ORDER — CEFTRIAXONE SODIUM 1 G IJ SOLR
1.0000 g | Freq: Once | INTRAMUSCULAR | Status: AC
  Administered 2019-05-22: 16:00:00 1 g via INTRAMUSCULAR

## 2019-05-22 MED ORDER — AZITHROMYCIN 250 MG PO TABS: ORAL_TABLET | ORAL | 0 refills | Status: DC

## 2019-05-22 MED ORDER — ONDANSETRON HCL 8 MG PO TABS: 8.0000 mg | ORAL_TABLET | Freq: Three times a day (TID) | ORAL | 0 refills | Status: DC | PRN

## 2019-05-22 MED FILL — AZITHROMYCIN 250 MG TABLET: 250 | 4 days supply | Qty: 4 | Fill #0

## 2019-05-22 MED FILL — ONDANSETRON HCL 8 MG TABLET: 8 | 7 days supply | Qty: 20 | Fill #0

## 2019-05-22 MED FILL — FLUCONAZOLE 150 MG TABLET: 150 | 1 days supply | Qty: 1 | Fill #0

## 2019-05-22 NOTE — Patient Instructions (Addendum)
Go to the ED if you worsen.  Do not resume sexual activity until after all of your results are back.  Take advil or tylenol for pain   Dolor plvico en la mujer Pelvic Pain, Female El dolor plvico se siente en la parte inferior del vientre (abdomen), debajo del ombligo y a nivel de las caderas. El dolor puede comenzar en forma repentina (ser [employer]), reaparecer (ser recurrente) o durar mucho tiempo (volverse crnico). El dolor plvico que dura ms de 6 meses se denomina dolor plvico crnico. El dolor plvico puede tener muchas causas. A veces, la causa del dolor plvico no se conoce. Siga estas indicaciones en su casa:   Tome los medicamentos de venta libre y los recetados solamente como se lo haya indicado el mdico.  Haga reposo como se lo haya indicado el mdico.  No tenga relaciones sexuales si siente dolor.  Lleve un registro del dolor plvico. Escriba los siguientes datos: ? Cundo Energy manager. ? La ubicacin del dolor. ? Qu parece mejorar o empeorar el dolor, como alimentos o el perodo (ciclo menstrual). ? Cualquier sntoma que se presente junto con Conservation officer, historic buildings.  Concurra a todas las visitas de control como se lo haya indicado el mdico. Esto es importante. Comunquese con un mdico si:  Los medicamentos no Forensic psychologist.  El dolor regresa.  Aparecen nuevos sntomas.  Tiene sangrado o secrecin inusual de la vagina.  Tiene fiebre o escalofros.  Tiene dificultad para defecar (estreimiento).  Observa sangre en el pis (orina) o en la materia fecal (heces).  El pis tiene mal olor.  Se siente dbil o siente que va a desvanecerse. Solicite ayuda inmediatamente si:  Technical brewer repentino y Wellsite geologist.  El dolor es cada Producer, television/film/video.  Siente un dolor muy intenso y tambin tiene alguno de estos sntomas: ? Cristy Hilts. ? Malestar estomacal (nuseas). ? Vmitos. ? Tiene mucho sudor.  Se desmaya (pierde el conocimiento). Resumen  El dolor plvico se siente  en la parte inferior del vientre (abdomen), debajo del ombligo y a nivel de las caderas.  Hay muchas causas posibles del dolor plvico.  Lleve un registro del dolor plvico. Esta informacin no tiene Marine scientist el consejo del mdico. Asegrese de hacerle al mdico cualquier pregunta que tenga. Document Revised: 10/19/2017 Document Reviewed: 10/19/2017 Elsevier Patient Education  [place].

## 2019-05-22 NOTE — Progress Notes (Signed)
Patient ID: [name], female   DOB: 1975-07-26, 44 y.o.   MRN: [id number]   [name], is a 44 y.o. female  MPN:[id number]  MGQ:[id number]  DOB - 04/12/1975  Subjective:  Chief Complaint and HPI: [name] is a 44 y.o. female here today Pain is in abdomen and back.  + nausea w/o vomiting.   Constance Holster #[id number] translating.  +SA and monogamous with partner.  BTL done 11 years ago.  Pan and chills come and go for about 5 days.  No change in BM.  No melena/hematochezia.  Pain is in lower abdomen and hurst when she urinates but not burning.  H/o cholecystectomy.  Also ongoing paresthesias and pain in B legs.      ROS:   Constitutional:  No f/c, No night sweats, No unexplained weight loss. EENT:  No vision changes, No blurry vision, No hearing changes. No mouth, throat, or ear problems.  Respiratory: No cough, No SOB Cardiac: No CP, no palpitations GI:  + abd pain, No V/D/C.  +nausea GU: + Urinary s/sx Musculoskeletal: No joint pain Neuro: No headache, no dizziness, no motor weakness.  Skin: No rash Endocrine:  No polydipsia. No polyuria.  Psych: Denies SI/HI  No problems updated.  ALLERGIES: No Known Allergies  PAST MEDICAL HISTORY: Past Medical History:  Diagnosis Date  . Chronic chest pain   . Chronic pelvic pain in female   . DDD (degenerative disc disease), cervical   . Depression   . Diabetes mellitus   . Dizziness   . Fatty liver   . GERD (gastroesophageal reflux disease)   . H/O echocardiogram 09/2013   normal  . Headache   . Hypertension   . Peripheral neuropathy   . Renal disorder     MEDICATIONS AT HOME: Prior to Admission medications   Medication Sig Start Date End Date Taking? Authorizing Provider  azithromycin (ZITHROMAX) 250 MG tablet Take all 4 at once 05/22/19   Argentina Donovan, PA-C  blood glucose meter kit and supplies KIT Dispense based on patient and insurance preference. Use up to four times daily as directed. (FOR  ICD-9 250.00, 250.01). 08/11/16   Marina Goodell A, MD  Blood Glucose Monitoring Suppl (TRUE METRIX METER) w/Device KIT Use to measure blood sugar twice a day 02/26/19   Elsie Stain, MD  fluconazole (DIFLUCAN) 150 MG tablet Take 1 dose by mouth if you have vaginal itching in 1 week 05/22/19   Argentina Donovan, PA-C  glucose blood (TRUE METRIX BLOOD GLUCOSE TEST) test strip Use as instructed to check blood sugar up to 3 times per day 04/17/19   Fulp, Cammie, MD  ibuprofen (ADVIL,MOTRIN) 200 MG tablet Take 400 mg by mouth every 6 (six) hours as needed.    [provider]  Insulin Glargine (LANTUS SOLOSTAR) 100 UNIT/ML Solostar Pen Inject 25 Units into the skin daily. 03/06/19   Elsie Stain, MD  insulin lispro (HUMALOG) 100 UNIT/ML KwikPen Inject 0.06 mLs (6 Units total) into the skin 2 (two) times daily before a meal. Before two largest meals of the day 03/06/19   Elsie Stain, MD  Insulin Pen Needle (PEN NEEDLES) 29G X 12MM MISC Use as directed 03/06/19   Elsie Stain, MD  lisinopril (ZESTRIL) 2.5 MG tablet Take 1 tablet (2.5 mg total) by mouth daily. 03/06/19   Elsie Stain, MD  metFORMIN (GLUCOPHAGE XR) 500 MG 24 hr tablet Take 2 tablets (1,000 mg total) by mouth 2 (two) times daily with  a meal. 04/17/19   Fulp, Cammie, MD  omeprazole (PRILOSEC) 40 MG capsule Take 1 capsule (40 mg total) by mouth 2 (two) times daily. For acid reflux 04/17/19   Fulp, Cammie, MD  ondansetron (ZOFRAN) 8 MG tablet Take 1 tablet (8 mg total) by mouth every 8 (eight) hours as needed for nausea or vomiting. 05/22/19   Argentina Donovan, PA-C  TRUEplus Lancets 28G MISC Use to measure blood sugar twice a day 03/06/19   Elsie Stain, MD     Objective:  EXAM:   Vitals:   05/22/19 1552  BP: 139/84  Pulse: 77  Resp: 16  Temp: 97.7 F (36.5 C)  SpO2: 99%  Weight: 228 lb 6.4 oz (103.6 kg)    General appearance : A&OX3. NAD. Non-toxic-appearing but does appear to be in pain;  Rocks back  and forth and winces at time.  Difficult to obtain history/difficult historian(gives long answers without really answering questions.   HEENT: Atraumatic and Normocephalic.  PERRLA. EOM intact.   Neck: supple, no JVD. No cervical lymphadenopathy. No thyromegaly Chest/Lungs:  Breathing-non-labored, Good air entry bilaterally, breath sounds normal without rales, rhonchi, or wheezing  CVS: S1 S2 regular, no murmurs, gallops, rubs  Abdomen: Bowel sounds present, Non tender and not distended with no gaurding, rigidity or rebound. GU:  External genitalia WNL. speculum  Inserted.  Cervix appears normal, there is some clear discharge.  Swabs of cervix taken.  Bimanual:  +chandelier sign;  +TTP over uterus and B ovaries w/o mass Extremities: Bilateral Lower Ext shows no edema, both legs are warm to touch with = pulse throughout Neurology:  CN II-XII grossly intact, Non focal.   Psych:  TP linear. J/I WNL. Normal speech. Appropriate eye contact and affect.  Skin:  No Rash  Data Review Lab Results  Component Value Date   HGBA1C 11.8 (A) 03/06/2019   HGBA1C 11.7 (A) 01/22/2018   HGBA1C 10.7 (A) 12/07/2017     Assessment & Plan   1. Uncontrolled type 2 diabetes mellitus with hyperglycemia, with long-term current use of insulin (HCC) Discussed diet(nonadherent) and continue current meds-see PCP for that in a few weeks as planned.   - Glucose (CBG) - POCT URINALYSIS DIP (CLINITEK) - Comprehensive metabolic panel  2. Pelvic pain Will cover for PID - POCT urine pregnancy - Comprehensive metabolic panel - CBC with Differential/Platelet - Cervicovaginal ancillary only - cefTRIAXone (ROCEPHIN) injection 1 g - azithromycin (ZITHROMAX) 250 MG tablet; Take all 4 at once  Dispense: 4 tablet; Refill: 0  3. Nausea - ondansetron (ZOFRAN) 8 MG tablet; Take 1 tablet (8 mg total) by mouth every 8 (eight) hours as needed for nausea or vomiting.  Dispense: 20 tablet; Refill: 0  4. Paresthesias - TSH  5.  Language barrier stratus interpreters used and additional time performing visit was required.  To ED if worsens    Patient have been counseled extensively about nutrition and exercise  Return for keep f/up 06/12/2019.  The patient was given clear instructions to go to ER or return to medical center if symptoms don't improve, worsen or new problems develop. The patient verbalized understanding. The patient was told to call to get lab results if they haven't heard anything in the next week.     Freeman Caldron, PA-C [facility] and [employer] [place], [place]   05/22/2019, 4:23 PM

## 2019-05-23 LAB — COMPREHENSIVE METABOLIC PANEL
ALT: 32 IU/L (ref 0–32)
AST: 34 IU/L (ref 0–40)
Albumin/Globulin Ratio: 1.4 (ref 1.2–2.2)
Albumin: 4.2 g/dL (ref 3.8–4.8)
Alkaline Phosphatase: 112 IU/L (ref 39–117)
BUN/Creatinine Ratio: 19 (ref 9–23)
BUN: 9 mg/dL (ref 6–24)
Bilirubin Total: 0.4 mg/dL (ref 0.0–1.2)
CO2: 25 mmol/L (ref 20–29)
Calcium: 9.2 mg/dL (ref 8.7–10.2)
Chloride: 102 mmol/L (ref 96–106)
Creatinine, Ser: 0.47 mg/dL — ABNORMAL LOW (ref 0.57–1.00)
GFR calc Af Amer: 139 mL/min/{1.73_m2} (ref >59)
GFR calc non Af Amer: 121 mL/min/{1.73_m2} (ref >59)
Globulin, Total: 2.9 g/dL (ref 1.5–4.5)
Glucose: 140 mg/dL — ABNORMAL HIGH (ref 65–99)
Potassium: 4.4 mmol/L (ref 3.5–5.2)
Sodium: 139 mmol/L (ref 134–144)
Total Protein: 7.1 g/dL (ref 6.0–8.5)

## 2019-05-23 LAB — CBC WITH DIFFERENTIAL/PLATELET
Basophils Absolute: 0.1 10*3/uL (ref 0.0–0.2)
Basos: 1 %
EOS (ABSOLUTE): 0.1 10*3/uL (ref 0.0–0.4)
Eos: 2 %
Hematocrit: 38.5 % (ref 34.0–46.6)
Hemoglobin: 12.6 g/dL (ref 11.1–15.9)
Immature Grans (Abs): 0.1 10*3/uL (ref 0.0–0.1)
Immature Granulocytes: 1 %
Lymphocytes Absolute: 2.2 10*3/uL (ref 0.7–3.1)
Lymphs: 29 %
MCH: 27.8 pg (ref 26.6–33.0)
MCHC: 32.7 g/dL (ref 31.5–35.7)
MCV: 85 fL (ref 79–97)
Monocytes Absolute: 0.5 10*3/uL (ref 0.1–0.9)
Monocytes: 6 %
Neutrophils Absolute: 4.8 10*3/uL (ref 1.4–7.0)
Neutrophils: 61 %
Platelets: 249 10*3/uL (ref 150–450)
RBC: 4.54 x10E6/uL (ref 3.77–5.28)
RDW: 12.4 % (ref 11.7–15.4)
WBC: 7.7 10*3/uL (ref 3.4–10.8)

## 2019-05-23 LAB — TSH: TSH: 4.49 u[IU]/mL (ref 0.450–4.500)

## 2019-05-24 LAB — CERVICOVAGINAL ANCILLARY ONLY
Bacterial Vaginitis (gardnerella): NEGATIVE
Candida Glabrata: NEGATIVE
Candida Vaginitis: NEGATIVE
Chlamydia: NEGATIVE
Comment: NEGATIVE
Comment: NEGATIVE
Comment: NEGATIVE
Comment: NEGATIVE
Comment: NEGATIVE
Comment: NORMAL
Neisseria Gonorrhea: NEGATIVE
Trichomonas: NEGATIVE

## 2019-05-27 ENCOUNTER — Ambulatory Visit: Payer: Self-pay | Admitting: Gastroenterology

## 2019-05-31 ENCOUNTER — Other Ambulatory Visit: Payer: Self-pay | Admitting: Surgery

## 2019-06-04 ENCOUNTER — Other Ambulatory Visit: Payer: Self-pay | Admitting: Surgery

## 2019-06-04 ENCOUNTER — Other Ambulatory Visit (HOSPITAL_COMMUNITY): Payer: Self-pay | Admitting: Surgery

## 2019-06-04 DIAGNOSIS — N6452 Nipple discharge: Secondary | ICD-10-CM

## 2019-06-12 ENCOUNTER — Encounter: Payer: Self-pay | Admitting: Family Medicine

## 2019-06-12 ENCOUNTER — Ambulatory Visit: Payer: Self-pay | Attending: Family Medicine | Admitting: Family Medicine

## 2019-06-12 ENCOUNTER — Other Ambulatory Visit: Payer: Self-pay

## 2019-06-12 VITALS — BP 124/84 | HR 77 | Temp 97.7°F | Ht 59.0 in | Wt 228.2 lb

## 2019-06-12 DIAGNOSIS — E669 Obesity, unspecified: Secondary | ICD-10-CM

## 2019-06-12 DIAGNOSIS — F411 Generalized anxiety disorder: Secondary | ICD-10-CM

## 2019-06-12 DIAGNOSIS — Z758 Other problems related to medical facilities and other health care: Secondary | ICD-10-CM

## 2019-06-12 DIAGNOSIS — Z789 Other specified health status: Secondary | ICD-10-CM

## 2019-06-12 DIAGNOSIS — Z603 Acculturation difficulty: Secondary | ICD-10-CM

## 2019-06-12 DIAGNOSIS — R1013 Epigastric pain: Secondary | ICD-10-CM

## 2019-06-12 DIAGNOSIS — K219 Gastro-esophageal reflux disease without esophagitis: Secondary | ICD-10-CM

## 2019-06-12 DIAGNOSIS — Z794 Long term (current) use of insulin: Secondary | ICD-10-CM

## 2019-06-12 DIAGNOSIS — R1030 Lower abdominal pain, unspecified: Secondary | ICD-10-CM

## 2019-06-12 DIAGNOSIS — E1165 Type 2 diabetes mellitus with hyperglycemia: Secondary | ICD-10-CM

## 2019-06-12 DIAGNOSIS — F430 Acute stress reaction: Secondary | ICD-10-CM

## 2019-06-12 DIAGNOSIS — Z6841 Body Mass Index (BMI) 40.0 and over, adult: Secondary | ICD-10-CM

## 2019-06-12 LAB — POCT URINALYSIS DIP (CLINITEK)
Bilirubin, UA: NEGATIVE
Blood, UA: NEGATIVE
Glucose, UA: NEGATIVE mg/dL
Ketones, POC UA: NEGATIVE mg/dL
Leukocytes, UA: NEGATIVE
Nitrite, UA: NEGATIVE
POC PROTEIN,UA: NEGATIVE
Spec Grav, UA: 1.025
Urobilinogen, UA: 1 U/dL
pH, UA: 5.5

## 2019-06-12 LAB — POCT GLYCOSYLATED HEMOGLOBIN (HGB A1C): Hemoglobin A1C: 7.6 % — AB (ref 4.0–5.6)

## 2019-06-12 MED ORDER — CIPROFLOXACIN HCL 500 MG PO TABS: 500.0000 mg | ORAL_TABLET | Freq: Two times a day (BID) | ORAL | 0 refills | Status: DC

## 2019-06-12 MED ORDER — OMEPRAZOLE 40 MG PO CPDR: 40.0000 mg | DELAYED_RELEASE_CAPSULE | Freq: Two times a day (BID) | ORAL | 3 refills | Status: DC

## 2019-06-12 MED FILL — CIPROFLOXACIN HCL 500 MG TA: 500 | 3 days supply | Qty: 6 | Fill #0

## 2019-06-12 MED FILL — OMEPRAZOLE DR 40 MG CAPSULE: 40 | 30 days supply | Qty: 60 | Fill #0
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
## 2019-06-12 NOTE — Progress Notes (Signed)
8 wks f/u

## 2019-06-17 ENCOUNTER — Other Ambulatory Visit: Payer: Self-pay

## 2019-06-17 ENCOUNTER — Ambulatory Visit (HOSPITAL_COMMUNITY)
Admission: RE | Admit: 2019-06-17 | Discharge: 2019-06-17 | Disposition: A | Discharge status: Home / Self Care | Payer: Self-pay | Source: Ambulatory Visit | Attending: Surgery | Admitting: Surgery

## 2019-06-17 DIAGNOSIS — N6452 Nipple discharge: Secondary | ICD-10-CM | POA: Insufficient documentation

## 2019-06-17 LAB — URINE CYTOLOGY ANCILLARY ONLY
Bacterial Vaginitis-Urine: POSITIVE — AB
Candida Urine: NEGATIVE
Chlamydia: NEGATIVE
Comment: NEGATIVE
Comment: NEGATIVE
Comment: NORMAL
Neisseria Gonorrhea: NEGATIVE
Trichomonas: NEGATIVE

## 2019-06-17 MED ORDER — GADOBUTROL 1 MMOL/ML IV SOLN
10.0000 mL | Freq: Once | INTRAVENOUS | Status: AC | PRN
  Administered 2019-06-17: 18:00:00 10 mL via INTRAVENOUS

## 2019-06-18 ENCOUNTER — Other Ambulatory Visit: Payer: Self-pay | Admitting: Family Medicine

## 2019-06-18 DIAGNOSIS — N760 Acute vaginitis: Secondary | ICD-10-CM

## 2019-06-18 DIAGNOSIS — B9689 Other specified bacterial agents as the cause of diseases classified elsewhere: Secondary | ICD-10-CM

## 2019-06-18 MED ORDER — METRONIDAZOLE 500 MG PO TABS: 500.0000 mg | ORAL_TABLET | Freq: Two times a day (BID) | ORAL | 0 refills | Status: DC

## 2019-06-18 MED FILL — metroNIDAZOLE 500 MG TABS: 500 | 7 days supply | Qty: 14 | Fill #0

## 2019-06-21 MED FILL — TRUE METRIX TEST STRIP: 33 days supply | Qty: 100 | Fill #0

## 2019-06-21 MED FILL — metFORMIN HCL ER 500 MG TB2: 500 | 30 days supply | Qty: 120 | Fill #0

## 2019-06-26 ENCOUNTER — Ambulatory Visit: Payer: No Typology Code available for payment source

## 2019-06-27 ENCOUNTER — Other Ambulatory Visit: Payer: Self-pay | Admitting: Surgery

## 2019-06-27 DIAGNOSIS — N6452 Nipple discharge: Secondary | ICD-10-CM

## 2019-07-02 ENCOUNTER — Encounter: Payer: Self-pay | Admitting: Gastroenterology

## 2019-07-02 ENCOUNTER — Ambulatory Visit: Payer: Self-pay | Admitting: Gastroenterology

## 2019-07-02 VITALS — BP 124/77 | HR 89 | Temp 97.8°F | Ht 59.0 in | Wt 228.0 lb

## 2019-07-02 DIAGNOSIS — K760 Fatty (change of) liver, not elsewhere classified: Secondary | ICD-10-CM

## 2019-07-02 DIAGNOSIS — R1030 Lower abdominal pain, unspecified: Secondary | ICD-10-CM

## 2019-07-02 DIAGNOSIS — R194 Change in bowel habit: Secondary | ICD-10-CM

## 2019-07-02 DIAGNOSIS — K625 Hemorrhage of anus and rectum: Secondary | ICD-10-CM

## 2019-07-02 MED ORDER — DICYCLOMINE HCL 10 MG PO CAPS: 10.0000 mg | ORAL_CAPSULE | Freq: Three times a day (TID) | ORAL | 3 refills | Status: DC | PRN

## 2019-07-02 MED ORDER — SUTAB 1479-225-188 MG PO TABS: 1.0000 | ORAL_TABLET | Freq: Once | ORAL | 0 refills | Status: AC

## 2019-07-02 MED FILL — DICYCLOMINE 10 MG CAPSULE: 10 | 10 days supply | Qty: 60 | Fill #0

## 2019-07-02 NOTE — Patient Instructions (Addendum)
If you are age 45 or older, your body mass index should be between 23-30. Your Body mass index is 46.05 kg/m. If this is out of the aforementioned range listed, please consider follow up with your Primary Care Provider.  If you are age 35 or younger, your body mass index should be between 19-25. Your Body mass index is 46.05 kg/m. If this is out of the aformentioned range listed, please consider follow up with your Primary Care Provider.   You have been scheduled for a colonoscopy. Please follow written instructions given to you at your visit today.  Please pick up your prep supplies at the pharmacy within the next 1-3 days. If you use inhalers (even only as needed), please bring them with you on the day of your procedure. Your physician has requested that you go to www.startemmi.com and enter the access code given to you at your visit today. This web site gives a general overview about your procedure. However, you should still follow specific instructions given to you by our office regarding your preparation for the procedure.   We are giving you a Sutab sample today to use for your prep.   We have sent the following medications to your pharmacy for you to pick up at your convenience: Bentyl 10 mg: Take one to two tablets every 8 hours as needed  Thank you for entrusting me with your care and for choosing [employer], Dr. Vernon Cellar

## 2019-07-02 NOTE — Progress Notes (Signed)
HPI :  44 year old female here for a follow-up visit for abdominal pain, altered bowel habits, rectal bleeding..  I last saw her in October 2019.  She has a history of fatty liver, H. pylori gastritis, and chronic abdominal pain.  She had an H. pylori stool antigen since have last seen her which was negative improved eradication of H. pylori.  She had a work-up for chronic elevation in liver enzymes which revealed a diagnosis of fatty liver.  She endorses problems with lower abdominal pain which is her main complaint.  She states this has been going on for some time, per prior notes it appears for at least 15 years.  She has lower abdominal pain mostly on a daily basis, however it comes and goes and she does have times when she is not in pain.  The urge to have a bowel movement can stimulate some of her pain and she does get some relief with a bowel movement.  She states she has anywhere between 1 and 4 bowel movements a day.  Sometimes the stools are loose, sometimes more formed.  She has been having rectal bleeding about 1 or 2 times a week for the past few years.  At the last time I saw her she was having issues with an anal fissure which we treated.  She denies any rectal pain at this time.  She has a strong urge to use the bathroom when she needs to go and has had one episode of incontinence due to this.  She denies any family history of colon cancer or inflammatory bowel disease.  In 2019 I had recommended a trial of Bentyl.  She states this did help her pain at the time but she ran out of it and has stopped using it.  She did have a colonoscopy in 2016 with Dr. Lovey Newcomer fields.  This exam was remarkable for benign hyperplastic polyp and redundant colon with a normal ileum, however she had a poor bowel preparation at the time.  She is otherwise had fluctuating weight recently, difficulty losing weight.  Her liver enzymes most recently have been normal. She has had a prior CT scan of her abdomen in 2015  and again in 2019 for her pain which did not show any significant abnormalities. She is s/p cholecystectomy.  Prior workup: Colonoscopy 04/07/2014 - normal TI, poor bowel prep with redundant colon, 37m rectal polyp - hyperplastic EGD 04/07/2014 - normal esophagus, gastritis / duodenitis  CT scan 2015 (cholelithiasis) and 2019 - fatty liver  [place] - gallstones, probable fatty liver  Tried bentyl H pylori stool antigen negative 12/20/17 Labs for chronic liver diseases negative other than elevated ANA - suspected fatty liver A1c has risen to 11.8 as of 3 months ago, and now down to 7.6 2 weeks ago LAEs normal on 05/22/19 Negative CT scan 2015    Past Medical History:  Diagnosis Date  . Chronic chest pain   . Chronic pelvic pain in female   . DDD (degenerative disc disease), cervical   . Depression   . Diabetes mellitus   . Dizziness   . Fatty liver   . GERD (gastroesophageal reflux disease)   . H/O echocardiogram 09/2013   normal  . Headache   . Hypertension   . Peripheral neuropathy   . Renal disorder      Past Surgical History:  Procedure Laterality Date  . CESAREAN SECTION     x 3  . CHOLECYSTECTOMY N/A 04/10/2015   Procedure:  LAPAROSCOPIC CHOLECYSTECTOMY ;  Surgeon: Coralie Keens, MD;  Location: [facility];  Service: General;  Laterality: N/A;  . COLONOSCOPY N/A 04/07/2014   LMB:EMLJQ HH/EPIGASTRIC PAIN/SMALL INTERNAL HEMORRHOIDS/MILD DIVERTICULOSIS IN LEFT COLON  . ESOPHAGOGASTRODUODENOSCOPY N/A 04/07/2014   GBE:EFEOF HH/EPIGASTRIC PAIN/SMALL INTERNAL HEMORRHOIDS/MILD DIVERTICULOSIS IN LEFT COLON  . ROTATOR CUFF REPAIR Right   . TUBAL LIGATION     Family History  Problem Relation Age of Onset  . Hypertension Mother   . Diabetes Father   . Hypertension Sister   . Diabetes Sister   . Diabetes Daughter   . Pancreatitis Daughter   . Migraines Daughter   . Asthma Daughter   . Colon cancer Neg Hx    Social History   Tobacco Use  . Smoking status: Never  Smoker  . Smokeless tobacco: Never Used  Substance Use Topics  . Alcohol use: No  . Drug use: No   Current Outpatient Medications  Medication Sig Dispense Refill  . blood glucose meter kit and supplies KIT Dispense based on patient and insurance preference. Use up to four times daily as directed. (FOR ICD-9 250.00, 250.01). 1 each 0  . Blood Glucose Monitoring Suppl (TRUE METRIX METER) w/Device KIT Use to measure blood sugar twice a day 1 kit 0  . ciprofloxacin (CIPRO) 500 MG tablet Take 1 tablet (500 mg total) by mouth 2 (two) times daily. 6 tablet 0  . glucose blood (TRUE METRIX BLOOD GLUCOSE TEST) test strip Use as instructed to check blood sugar up to 3 times per day 100 each 12  . ibuprofen (ADVIL,MOTRIN) 200 MG tablet Take 400 mg by mouth every 6 (six) hours as needed.    . Insulin Glargine (LANTUS SOLOSTAR) 100 UNIT/ML Solostar Pen Inject 25 Units into the skin daily. 5 pen 3  . insulin lispro (HUMALOG) 100 UNIT/ML KwikPen Inject 0.06 mLs (6 Units total) into the skin 2 (two) times daily before a meal. Before two largest meals of the day 3 mL 3  . Insulin Pen Needle (PEN NEEDLES) 29G X 12MM MISC Use as directed 100 each 2  . lisinopril (ZESTRIL) 2.5 MG tablet Take 1 tablet (2.5 mg total) by mouth daily. 30 tablet 5  . metFORMIN (GLUCOPHAGE XR) 500 MG 24 hr tablet Take 2 tablets (1,000 mg total) by mouth 2 (two) times daily with a meal. 120 tablet 4  . metroNIDAZOLE (FLAGYL) 500 MG tablet Take 1 tablet (500 mg total) by mouth 2 (two) times daily. 14 tablet 0  . omeprazole (PRILOSEC) 40 MG capsule Take 1 capsule (40 mg total) by mouth 2 (two) times daily. For acid reflux 60 capsule 3  . ondansetron (ZOFRAN) 8 MG tablet Take 1 tablet (8 mg total) by mouth every 8 (eight) hours as needed for nausea or vomiting. 20 tablet 0  . TRUEplus Lancets 28G MISC Use to measure blood sugar twice a day 100 each 1   No current facility-administered medications for this visit.   No Known  Allergies   Review of Systems: All systems reviewed and negative except where noted in HPI.    MR BREAST BILATERAL W WO CONTRAST INC CAD  Result Date: 06/18/2019 CLINICAL DATA:  44 year old female with recent diagnostic evaluation for clear left nipple discharge. Today's history states bilateral nipple discharge. LABS:  None performed on site. EXAM: BILATERAL BREAST MRI WITH AND WITHOUT CONTRAST TECHNIQUE: Multiplanar, multisequence MR images of both breasts were obtained prior to and following the intravenous administration of 10 ml of Gadavist. Three-dimensional MR  images were rendered by post-processing of the original MR data on an independent workstation. The three-dimensional MR images were interpreted, and findings are reported in the following complete MRI report for this study. Three dimensional images were evaluated at the independent DynaCad workstation COMPARISON:  Previous exam(s). FINDINGS: Breast composition: c. Heterogeneous fibroglandular tissue. Background parenchymal enhancement: Moderate to marked. Right breast: There is a 5 mm in density in the subareolar right breast (series 11, image 83/112). Otherwise, no suspicious mass or abnormal enhancement throughout the remainder of the right breast. Left breast: No suspicious mass or abnormal enhancement. Lymph nodes: No abnormal appearing lymph nodes. Ancillary findings:  None. IMPRESSION: 1. Indeterminate 5 mm enhancing focus in the subareolar right breast. 2. No suspicious MRI findings on the left. 3. No suspicious lymphadenopathy. RECOMMENDATION: 1. Recommendation is that the patient return for second-look ultrasound of the subareolar right breast. If no ultrasound correlate can be identified for the 5 mm enhancing focus, MRI guided biopsy should be attempted. However, this may be difficult given the location of the findings. 2. Pending above, recommendation stands for surgical consultation for further evaluation of the patient's symptoms.  BI-RADS CATEGORY  4: Suspicious. Electronically Signed   By: Kristopher Oppenheim M.D.   On: 06/18/2019 08:28   Lab Results  Component Value Date   WBC 7.7 05/22/2019   HGB 12.6 05/22/2019   HCT 38.5 05/22/2019   MCV 85 05/22/2019   PLT 249 05/22/2019    Lab Results  Component Value Date   CREATININE 0.47 (L) 05/22/2019   BUN 9 05/22/2019   NA 139 05/22/2019   K 4.4 05/22/2019   CL 102 05/22/2019   CO2 25 05/22/2019    Lab Results  Component Value Date   ALT 32 05/22/2019   AST 34 05/22/2019   ALKPHOS 112 05/22/2019   BILITOT 0.4 05/22/2019     Physical Exam: BP 124/77   Pulse 89   Temp 97.8 F (36.6 C)   Ht 4' 11"  (1.499 m)   Wt 228 lb (103.4 kg)   SpO2 99%   BMI 46.05 kg/m  Abdominal: Soft, nondistended, mild lower abdominal TTP without rebound or guarding.  There are no masses palpable. No hepatomegaly. Extremities: no edema Lymphadenopathy: No cervical adenopathy noted. Neurological: Alert and oriented to person place and time. Skin: Skin is warm and dry. No rashes noted. Psychiatric: Normal mood and affect. Behavior is normal.   ASSESSMENT AND PLAN: 44 year old female here for reassessment of the following:  Lower abdominal pain / altered bowel habits / rectal bleeding - symptoms ongoing for some time, colonoscopy in 2016 did not show any concerning pathology however the bowel prep was poor and the exam was limited.  While I suspect she probably has irritable bowel syndrome causing the symptoms, given her ongoing rectal bleeding despite treatment for the fissure I am recommending a colonoscopy to ensure no other pathology.  We will make sure no evidence of IBD or microscopic colitis, ensure no bleeding polyps/mass lesions etc.  I discussed risk and benefits of colonoscopy and anesthesia with her and she want to proceed.  We will use a prep and a half an additional MiraLAX as needed to ensure her preparation is adequate.  In the interim I will give her some Bentyl 10  mg 1 to 2 tablets every 8 hours to use as needed for abdominal pain.  She has responded well to this in the past and hopefully this provides her some benefit again moving forward.  Will await colonoscopy results.  If no evidence of inflammation causing her loose stools / urgency may consider trial of Colestid given her history of cholecystectomy. She agreed.   Fatty liver - noted on multiple imaging modalities.  Serologic work-up for other liver diseases has not shown any other chronic liver disease.  Long-term, we discussed importance of weight loss to minimize risks of Karlene Lineman and cirrhosis.  Liver enzymes most recently normal which is good.  We will need to keep an eye on this yearly.  She agreed  [place] Cellar, MD [facility]

## 2019-07-03 ENCOUNTER — Ambulatory Visit: Payer: Self-pay | Attending: Family Medicine | Admitting: Physician Assistant

## 2019-07-03 ENCOUNTER — Other Ambulatory Visit: Payer: Self-pay

## 2019-07-03 DIAGNOSIS — K219 Gastro-esophageal reflux disease without esophagitis: Secondary | ICD-10-CM

## 2019-07-03 DIAGNOSIS — E1165 Type 2 diabetes mellitus with hyperglycemia: Secondary | ICD-10-CM

## 2019-07-03 DIAGNOSIS — Z794 Long term (current) use of insulin: Secondary | ICD-10-CM

## 2019-07-03 NOTE — Progress Notes (Signed)
Patient ID: [name], female   DOB: 1975-04-15, 44 y.o.   MRN: [id number] Virtual Visit via Telephone Note  I connected with [name] on 07/03/19 at 11:10 AM EDT by telephone and verified that I am speaking with the correct person using two identifiers.   I discussed the limitations, risks, security and privacy concerns of performing an evaluation and management service by telephone and the availability of in person appointments. I also discussed with the patient that there may be a patient responsible charge related to this service. The patient expressed understanding and agreed to proceed.  PATIENT visit by telephone virtually in the context of Covid-19 pandemic. Patient location:  home My Location:  [place] office Persons on the call:  Me and the patient and interpreter    History of Present Illness:  She is following up today for abdominal pain.  She saw GI yesterday.  Colonoscopy is scheduled.  LMP 06/24/2019. No acute/new pain.    Blood sugars running 133-197.  Compliant with medications.      Observations/Objective:  NAD.  A&Ox3   Assessment and Plan:   1. Uncontrolled type 2 diabetes mellitus with hyperglycemia, with long-term current use of insulin ([employer]) I have had a lengthy discussion and provided education about insulin resistance and the intake of too much sugar/refined carbohydrates.  I have advised the patient to work at a goal of eliminating sugary drinks, candy, desserts, sweets, refined sugars, processed foods, and white carbohydrates.  The patient expresses understanding.  Drink 80-100 ounces water daily Continue current medication regimen   2. Gastroesophageal reflux disease, unspecified whether esophagitis present Continue follow-up with GI.      Follow Up Instructions: See PCP in 2 months   I discussed the assessment and treatment plan with the patient. The patient was provided an opportunity to ask questions and all were answered. The  patient agreed with the plan and demonstrated an understanding of the instructions.   The patient was advised to call back or seek an in-person evaluation if the symptoms worsen or if the condition fails to improve as anticipated.  I provided 13 minutes of non-face-to-face time during this encounter.   Freeman Caldron, PA-C

## 2019-07-03 NOTE — Progress Notes (Signed)
No c/o

## 2019-07-08 ENCOUNTER — Other Ambulatory Visit: Payer: Self-pay

## 2019-07-08 ENCOUNTER — Ambulatory Visit (AMBULATORY_SURGERY_CENTER): Payer: Self-pay | Admitting: Gastroenterology

## 2019-07-08 ENCOUNTER — Encounter: Payer: Self-pay | Admitting: Gastroenterology

## 2019-07-08 VITALS — BP 119/62 | HR 64 | Temp 97.1°F | Resp 12 | Ht 59.0 in | Wt 228.0 lb

## 2019-07-08 DIAGNOSIS — D122 Benign neoplasm of ascending colon: Secondary | ICD-10-CM

## 2019-07-08 DIAGNOSIS — K625 Hemorrhage of anus and rectum: Secondary | ICD-10-CM

## 2019-07-08 DIAGNOSIS — K648 Other hemorrhoids: Secondary | ICD-10-CM

## 2019-07-08 DIAGNOSIS — K635 Polyp of colon: Secondary | ICD-10-CM

## 2019-07-08 DIAGNOSIS — R1030 Lower abdominal pain, unspecified: Secondary | ICD-10-CM

## 2019-07-08 MED ORDER — SODIUM CHLORIDE 0.9 % IV SOLN: 500.0000 mL | Freq: Once | INTRAVENOUS | Status: DC

## 2019-07-08 NOTE — Patient Instructions (Addendum)
1 polyp removed and sent to pathology.  Internal Hemorrhoids.  Await pathology for final recommendations.  Handouts on findings given to patient.    USTED TUVO UN PROCEDIMIENTO ENDOSCPICO HOY EN EL [facility]:   Lea el informe del procedimiento que se le entreg para cualquier pregunta especfica sobre lo que se Primary school teacher.  Si el informe del examen no responde a sus preguntas, por favor llame a su gastroenterlogo para aclararlo.  Si usted solicit que no se le den [employer] de lo que se Estate manager/land agent en su procedimiento al [employer] va a cuidar, entonces el informe del procedimiento se ha incluido en un sobre sellado para que usted lo revise despus cuando le sea ms conveniente.   LO QUE PUEDE ESPERAR: Algunas sensaciones de hinchazn en el abdomen.  Puede tener ms gases de lo normal.  El caminar puede ayudarle a eliminar el aire que se le puso en el tracto gastrointestinal durante el procedimiento y reducir la hinchazn.  Si le hicieron una endoscopia inferior (como una colonoscopia o una sigmoidoscopia flexible), podra notar manchas de sangre en las heces fecales o en el papel higinico.  Si se someti a una preparacin intestinal para su procedimiento, es posible que no tenga una evacuacin intestinal normal durante [employer].   Tenga en cuenta:  Es posible que note un poco de irritacin y congestin en la nariz o algn drenaje.  Esto es debido al oxgeno [employer] durante su procedimiento.  No hay que preocuparse y esto debe desaparecer ms o Scientist, research (medical).   SNTOMAS PARA REPORTAR INMEDIATAMENTE:  Despus de una endoscopia inferior (colonoscopia o sigmoidoscopia flexible):  Cantidades excesivas de sangre en las heces fecales  Sensibilidad significativa o empeoramiento de los Elli abdominales   Hinchazn aguda del abdomen que antes no tena   Fiebre de 100F o ms     Para asuntos urgentes o de Freight forwarder, puede comunicarse con un gastroenterlogo a  cualquier hora llamando al [phone].  DIETA:  Recomendamos una comida pequea al principio, pero luego puede continuar con su dieta normal.  Tome muchos lquidos, Teacher, adult education las bebidas alcohlicas durante 24 horas.    ACTIVIDAD:  Debe planear tomarse las cosas con calma por el resto del da y no debe CONDUCIR ni usar maquinaria pesada Programmer, applications (debido a los medicamentos de sedacin utilizados durante el examen).     SEGUIMIENTO: Nuestro personal llamar al nmero que aparece en su historial al siguiente da hbil de su procedimiento para ver cmo se siente y para responder cualquier pregunta o inquietud que pueda tener con respecto a la informacin que se le dio despus del procedimiento. Si no podemos contactarle, le dejaremos un mensaje.  Sin embargo, si se siente bien y no tiene Paediatric nurse, no es necesario que nos devuelva la llamada.  Asumiremos que ha regresado a sus actividades diarias normales sin incidentes. Si se le tomaron algunas biopsias, le contactaremos por telfono o por carta en las prximas 3 semanas.  Si no ha sabido [employer] biopsias en el transcurso de 3 semanas, por favor llmenos al [phone].   FIRMAS/CONFIDENCIALIDAD: Usted y/o el acompaante que le cuide han firmado documentos que se ingresarn en su historial mdico electrnico.  Estas firmas atestiguan el hecho de que la informacin anterior

## 2019-07-08 NOTE — Progress Notes (Signed)
Tempo by Ledora Bottcher by Martin Majestic

## 2019-07-08 NOTE — Progress Notes (Signed)
Called to room to assist during endoscopic procedure.  Patient ID and intended procedure confirmed with present staff. Received instructions for my participation in the procedure from the performing physician.  

## 2019-07-08 NOTE — Progress Notes (Signed)
A/ox3, pleased with MAC, report to RN 

## 2019-07-08 NOTE — Op Note (Signed)
[facility] Patient Name: [name] Procedure Date: 07/08/2019 10:52 AM MRN: [id number] Endoscopist: Remo Lipps P. Havery Moros , MD Age: 44 Referring MD:  Date of Birth: 05-04-1975 Gender: Female Account #: [id number] Procedure:                Colonoscopy Indications:              Lower abdominal pain, Rectal bleeding, Change in                            bowel habits - last colonoscopy limited by poor prep Medicines:                Monitored Anesthesia Care Procedure:                Pre-Anesthesia Assessment:                           - Prior to the procedure, a History and Physical                            was performed, and patient medications and                            allergies were reviewed. The patient's tolerance of                            previous anesthesia was also reviewed. The risks                            and benefits of the procedure and the sedation                            options and risks were discussed with the patient.                            All questions were answered, and informed consent                            was obtained. Prior Anticoagulants: The patient has                            taken no previous anticoagulant or antiplatelet                            agents. ASA Grade Assessment: II - A patient with                            mild systemic disease. After reviewing the risks                            and benefits, the patient was deemed in                            satisfactory condition to undergo the procedure.  After obtaining informed consent, the colonoscope                            was passed under direct vision. Throughout the                            procedure, the patient's blood pressure, pulse, and                            oxygen saturations were monitored continuously. The                            Colonoscope was introduced through the anus and      advanced to the the terminal ileum, with                            identification of the appendiceal orifice and IC                            valve. The colonoscopy was performed without                            difficulty. The patient tolerated the procedure                            well. The quality of the bowel preparation was                            good. The terminal ileum, ileocecal valve,                            appendiceal orifice, and rectum were photographed. Scope In: 11:02:43 AM Scope Out: 11:23:07 AM Scope Withdrawal Time: 0 hours 18 minutes 26 seconds  Total Procedure Duration: 0 hours 20 minutes 24 seconds  Findings:                 The perianal and digital rectal examinations were                            normal.                           The terminal ileum appeared normal.                           A 3 mm polyp was found in the ascending colon. The                            polyp was sessile. The polyp was removed with a                            cold biopsy forceps. Resection and retrieval were                            complete.  Internal hemorrhoids were found during retroflexion.                           The exam was otherwise without abnormality.                           Biopsies for histology were taken with a cold                            forceps from the right colon, left colon and                            transverse colon for evaluation of microscopic                            colitis. Complications:            No immediate complications. Estimated blood loss:                            Minimal. Estimated Blood Loss:     Estimated blood loss was minimal. Impression:               - The examined portion of the ileum was normal.                           - One 3 mm polyp in the ascending colon, removed                            with a cold biopsy forceps. Resected and retrieved.                           -  Internal hemorrhoids.                           - The examination was otherwise normal.                           - Biopsies were taken with a cold forceps from the                            right colon, left colon and transverse colon for                            evaluation of microscopic colitis. Recommendation:           - Patient has a contact number available for                            emergencies. The signs and symptoms of potential                            delayed complications were discussed with the                            patient. Return  to normal activities tomorrow.                            Written discharge instructions were provided to the                            patient.                           - Resume previous diet.                           - Continue present medications.                           - Continue with trial of Bentyl as previously                            recommended                           - Await pathology results. Further recommendations                            pending path results [name]. [name], MD 07/08/2019 11:27:46 AM This report has been signed electronically.

## 2019-07-08 NOTE — Progress Notes (Signed)
Pt complaining of cramping.  Unable to pass air.  Unhooked from monitors and up to restroom.

## 2019-07-09 ENCOUNTER — Ambulatory Visit
Admission: RE | Admit: 2019-07-09 | Discharge: 2019-07-09 | Disposition: A | Discharge status: Home / Self Care | Payer: No Typology Code available for payment source | Source: Ambulatory Visit | Attending: Surgery | Admitting: Surgery

## 2019-07-09 ENCOUNTER — Other Ambulatory Visit: Payer: Self-pay | Admitting: Surgery

## 2019-07-09 DIAGNOSIS — N6452 Nipple discharge: Secondary | ICD-10-CM

## 2019-07-10 ENCOUNTER — Other Ambulatory Visit: Payer: Self-pay

## 2019-07-10 ENCOUNTER — Ambulatory Visit: Payer: Self-pay | Attending: Family Medicine | Admitting: Licensed Clinical Social Worker

## 2019-07-10 ENCOUNTER — Telehealth: Payer: Self-pay

## 2019-07-10 DIAGNOSIS — F439 Reaction to severe stress, unspecified: Secondary | ICD-10-CM

## 2019-07-10 NOTE — Telephone Encounter (Signed)
  Follow up Call-  Call back number 07/08/2019  Post procedure Call Back phone  # [phone]--- daughter Anderson Malta  Permission to leave phone message Yes  Some recent data might be hidden     Patient questions:  Do you have a fever, pain , or abdominal swelling? No. Pain Score  0 *  Have you tolerated food without any problems? Yes.    Have you been able to return to your normal activities? Yes.    Do you have any questions about your discharge instructions: Diet   No. Medications  No. Follow up visit  No.  Do you have questions or concerns about your Care? No.  Actions: * If pain score is 4 or above: No action needed, pain <4.  Have you developed a fever since your procedure? no 2.   Have you had an respiratory symptoms (SOB or cough) since your procedure? no  3.   Have you tested positive for COVID 19 since your procedure no  4.   Have you had any family members/close contacts diagnosed with the COVID 19 since your procedure? no   If yes to any of these questions please route to Joylene John, RN and Erenest Rasher, RN

## 2019-07-11 ENCOUNTER — Telehealth: Payer: Self-pay | Admitting: Family Medicine

## 2019-07-11 ENCOUNTER — Telehealth: Payer: Self-pay | Admitting: Gastroenterology

## 2019-07-11 NOTE — Telephone Encounter (Signed)
Left message to call back to give path results per [facility].

## 2019-07-11 NOTE — Telephone Encounter (Signed)
[name] called saying that her gastroenterologist told her to call her PCP and ask if it was fine for her to take a treatment that the provider is requesting for her to take. Patient states that she had a colonoscopy done and was informed that something was found but patient states she did not understand what it was. Please f/u

## 2019-07-11 NOTE — Telephone Encounter (Signed)
Patient returned the call, advised her of results along with the recommendations from Dr. Havery Moros. Patient understood no questions at this time. Patient will reach out to PCP and call our office back.

## 2019-07-15 NOTE — Telephone Encounter (Signed)
Please contact patient's gastroenterologist nurse or medical assistant and clarify the information that was given to the patient or asked that they call the patient as she is not sure as to what they informed her regarding her recent GI visit

## 2019-07-23 NOTE — Telephone Encounter (Signed)
LMOM for patient to call office back. 

## 2019-07-24 NOTE — Telephone Encounter (Addendum)
Patient called to get the name of the medication that is being recommended for her to start (Colestid) trial for loose stool. Provider is wanting to make sure it is ok for her since she has hx of elevated triglycerides. Patient also stated that she did understand all the information provided to her just did not remember the name of medication. She asked that I clarify that for her.   Please advise

## 2019-07-26 NOTE — Telephone Encounter (Signed)
LMOM

## 2019-08-07 ENCOUNTER — Telehealth: Payer: Self-pay | Admitting: Licensed Clinical Social Worker

## 2019-08-07 NOTE — BH Specialist Note (Signed)
Integrated Behavioral Health Visit via Telemedicine (Telephone)  07/10/2019 [name] [id number]   Session Start time: 3:55 PM  Session End time: 4:10 PM Total time: 15  Referring Provider: Dr. Chapman Fitch Type of Visit: Telephonic Patient location: Home [facility] Provider location: Office All persons participating in visit: Pt and LCSW and Interpreter (id# [id number])  Confirmed patient's address: Yes  Confirmed patient's phone number: Yes  Any changes to demographics: No   Confirmed patient's insurance: Yes  Any changes to patient's insurance: No   Discussed confidentiality: Yes    The following statements were read to the patient and/or legal guardian that are established with the [facility] Provider.  "The purpose of this phone visit is to provide behavioral health care while limiting exposure to the coronavirus (COVID19).  There is a possibility of technology failure and discussed alternative modes of communication if that failure occurs."  "By engaging in this telephone visit, you consent to the provision of healthcare.  Additionally, you authorize for your insurance to be billed for the services provided during this telephone visit."   Patient and/or legal guardian consented to telephone visit: Yes   PRESENTING CONCERNS: Patient and/or family reports the following symptoms/concerns: Pt reports increase in stress triggered by psychosocial stressors. Per pt, they are at risk of being put out of home Duration of problem: Ongoing; Severity of problem: moderate  STRENGTHS (Protective Factors/Coping Skills): Pt participates in therapy monthly at [facility] [employer] Pt receives support from spouse  GOALS ADDRESSED: Patient will: 1.  Reduce symptoms of: anxiety, depression and stress  2.  Increase knowledge and/or ability of: coping skills and healthy habits  3.  Demonstrate ability to: Increase healthy adjustment to current life circumstances and  Increase adequate support systems for patient/family  INTERVENTIONS: Interventions utilized:  Solution-Focused Strategies, Supportive Counseling and Link to [employer] Standardized Assessments completed: Not Needed  ASSESSMENT: Patient currently experiencing psychosocial stressors resulting in increase in anxiety and depression symptoms.    Patient may benefit from linkage to community resources. LCSW provided support and validation. Pt provided verbal consent for LCSW to complete Legal Aid referral.   PLAN: 1. Follow up with behavioral health clinician on : Contact LCSW for any additional behavioral health and/or resource needs 2. Behavioral recommendations: Follow up with Legal Aid referral 3. Referral(s): [place] ([place]) and [employer]:  Housing  Rebekah [place], [place] 08/07/2019 9:41 AM

## 2019-08-07 NOTE — Telephone Encounter (Signed)
LCSW completed follow up meeting with Legal Aid. Pt did not qualify for services due to citizenship status and was referred to a private law firm.

## 2019-08-29 MED FILL — TRUE METRIX TEST STRIP: 33 days supply | Qty: 100 | Fill #1

## 2019-08-29 MED FILL — ?BASAGLAR 100 UNITS/ML KWPE: 100 | 24 days supply | Qty: 6 | Fill #3

## 2019-08-29 MED FILL — metFORMIN HCL ER 500 MG TB2: 500 | 30 days supply | Qty: 120 | Fill #1

## 2019-08-29 MED FILL — ?HUMALOG 100 UNITS/ML KWIKP: 100 | 25 days supply | Qty: 3 | Fill #3

## 2019-11-29 ENCOUNTER — Other Ambulatory Visit: Payer: Self-pay

## 2019-11-29 ENCOUNTER — Ambulatory Visit: Payer: No Typology Code available for payment source | Attending: Family Medicine

## 2019-12-10 ENCOUNTER — Telehealth: Payer: Self-pay | Admitting: Family Medicine

## 2019-12-10 NOTE — Telephone Encounter (Signed)
Pt was sent a letter from financial dept. Inform them, that the application they submitted was incomplete, since they were missing some documentation at the time of the appointment, Pt need to reschedule and resubmit all new papers and application for [employer]FA and [place], P.S. old documents has been sent back by mail to the Pt and Pt. need to make a new ap

## 2019-12-18 ENCOUNTER — Ambulatory Visit: Payer: No Typology Code available for payment source | Admitting: Family Medicine

## 2020-06-10 ENCOUNTER — Other Ambulatory Visit: Payer: Self-pay | Admitting: Obstetrics and Gynecology

## 2020-06-10 DIAGNOSIS — Z1231 Encounter for screening mammogram for malignant neoplasm of breast: Secondary | ICD-10-CM

## 2020-06-30 ENCOUNTER — Ambulatory Visit
Admission: RE | Admit: 2020-06-30 | Discharge: 2020-06-30 | Disposition: A | Discharge status: Home / Self Care | Payer: No Typology Code available for payment source | Source: Ambulatory Visit | Attending: Obstetrics and Gynecology | Admitting: Obstetrics and Gynecology

## 2020-06-30 ENCOUNTER — Ambulatory Visit: Payer: Self-pay | Admitting: *Deleted

## 2020-06-30 ENCOUNTER — Other Ambulatory Visit: Payer: Self-pay

## 2020-06-30 VITALS — Wt 219.5 lb

## 2020-06-30 DIAGNOSIS — Z1231 Encounter for screening mammogram for malignant neoplasm of breast: Secondary | ICD-10-CM

## 2020-06-30 DIAGNOSIS — Z1239 Encounter for other screening for malignant neoplasm of breast: Secondary | ICD-10-CM

## 2020-06-30 NOTE — Patient Instructions (Signed)
Explained breast self awareness with [facility]. Patient did not need a Pap smear today due to last Pap smear and HPV typing was 04/16/2019. Let her know BCCCP will cover Pap smears and HPV typing every 5 years unless has a history of abnormal Pap smears. Referred patient to the [place] for a screening mammogram on the mobile unit. Appointment scheduled Tuesday, June 30, 2020 at 1050. Patient escorted to the mobile unit following BCCCP appointment for her screening mammogram. Let patient know the [facility] Center will follow up with her within the next couple weeks with results of her mammogram by letter or phone. [name] verbalized understanding.  Hannah Strader, Arvil Chaco, RN 10:22 AM

## 2020-06-30 NOTE — Progress Notes (Signed)
[name] is a 45 y.o. female who presents to [facility] clinic today with complaint of bilateral breast pain x three years and bilateral breast discharge since prior to her [place] visit 04/16/2019. Discharge was followed up by diagnostic mammogram, ultrasound and biopsy. Screening mammogram recommended in one year. Per patient the pain occurs around the time of the month that she typically had her menstrual cycle. Patient stated she is no longer having her periods monthly. She stated she had one in June 2021 and March 2022.   Pap Smear: Pap smear not completed today. Last Pap smear was 04/16/2019 at [facility] clinic and was normal with negative HPV. Per patient has no history of an abnormal Pap smear. Last Pap smear result is available in Epic.   Physical exam: Breasts Breasts symmetrical. No skin abnormalities bilateral breasts. No nipple retraction bilateral breasts. No nipple discharge bilateral breasts. Unable to express any nipple discharge on exam from bilateral breasts. No lymphadenopathy. No lumps palpated bilateral breasts. No complaints of pain or tenderness on exam.      MS DIGITAL DIAG TOMO BILAT  Addendum Date: 05/27/2019   ADDENDUM REPORT: 05/24/2019 12:29 ADDENDUM: Surgical consultation for evaluation of indeterminate LEFT breast clear nipple discharge has been arranged with Dr. Nedra Hai at [facility] on May 31, 2019. Terie Purser, RN on 05/24/2019. Electronically Signed   By: Lovey Newcomer M.D.   On: 05/24/2019 12:29   Result Date: 05/27/2019 CLINICAL DATA:  Patient presents for evaluation of spontaneous clear left nipple discharge. EXAM: DIGITAL DIAGNOSTIC BILATERAL MAMMOGRAM WITH CAD AND TOMO ULTRASOUND LEFT BREAST COMPARISON:  None ACR Breast Density Category a: The breast tissue is almost entirely fatty. FINDINGS: No concerning masses, calcifications or distortion identified within the left or right breast. Mammographic images were processed with CAD. The patient  is able to elicit a small amount of clear discharge from the left nipple with palpation. Targeted ultrasound is performed, showing no dilated duct or retroareolar mass within the left breast. There is a small 5 x 2 x 6 mm cyst left breast 10 o'clock position 2 cm from nipple. IMPRESSION: 1. Indeterminate clear spontaneous left nipple discharge. RECOMMENDATION: Bilateral breast MRI and surgical consultation for further evaluation of indeterminate left breast clear nipple discharge. I have discussed the findings and recommendations with the patient. If applicable, a reminder letter will be sent to the patient regarding the next appointment. BI-RADS CATEGORY  2: Benign. Electronically Signed: By: Lovey Newcomer M.D. On: 05/22/2019 14:10   MM CLIP PLACEMENT RIGHT  Result Date: 07/09/2019 CLINICAL DATA:  Post ultrasound-guided biopsy of a mass in the right breast at 12 o'clock retroareolar. EXAM: DIAGNOSTIC RIGHT MAMMOGRAM POST ULTRASOUND BIOPSY COMPARISON:  Previous exams. FINDINGS: Mammographic images were obtained following ultrasound guided biopsy of a mass in the right breast at 12 o'clock retroareolar. A ribbon shaped biopsy marking clip is present at the site of the biopsied mass in the right breast at 12 o'clock retroareolar. IMPRESSION: Ribbon shaped biopsy marking clip at site of biopsied mass in the right breast at 12 o'clock retroareolar. Final Assessment: Post Procedure Mammograms for Marker Placement Electronically Signed   By: Everlean Alstrom M.D.   On: 07/09/2019 14:44    Pelvic/Bimanual Pap is not indicated today per BCCCP guidelines.   Smoking History: Patient has never smoked.   Patient Navigation: Patient education provided. Access to services provided for patient through [place] program. Spanish interpreter Rudene Anda from [facility] provided.   Colorectal Cancer Screening: Per patient  has had colonoscopy completed on 07/08/2019 at [place]. Patient stated she had a precancerous polyp  removed and a repeat colonoscopy is due in 7 years. No complaints today.    Breast and Cervical Cancer Risk Assessment: Patient does not have family history of breast cancer, known genetic mutations, or radiation treatment to the chest before age 51. Patient does not have history of cervical dysplasia, immunocompromised, or DES exposure in-utero.  Risk Assessment    Risk Scores      06/30/2020 04/16/2019   Last edited by: Demetrius Revel, LPN McGill, Sherie Mamie Nick, LPN   5-year risk: 0.5 % 0.4 %   Lifetime risk: 5.6 % 5.6 %          A: BCCCP exam without pap smear Complaint of bilateral breast pain.  P: Referred patient to the [place] for a screening mammogram on the mobile unit. Appointment scheduled Tuesday, June 30, 2020 at 1050.  Loletta Parish, RN 06/30/2020 10:21 AM

## 2021-05-03 ENCOUNTER — Other Ambulatory Visit: Payer: Self-pay

## 2021-05-03 DIAGNOSIS — Z1231 Encounter for screening mammogram for malignant neoplasm of breast: Secondary | ICD-10-CM

## 2021-07-08 ENCOUNTER — Ambulatory Visit: Payer: No Typology Code available for payment source | Admitting: *Deleted

## 2021-07-08 ENCOUNTER — Ambulatory Visit
Admission: RE | Admit: 2021-07-08 | Discharge: 2021-07-08 | Disposition: A | Discharge status: Home / Self Care | Payer: No Typology Code available for payment source | Source: Ambulatory Visit | Attending: Obstetrics and Gynecology | Admitting: Obstetrics and Gynecology

## 2021-07-08 VITALS — BP 118/84 | Wt 228.2 lb

## 2021-07-08 DIAGNOSIS — Z1231 Encounter for screening mammogram for malignant neoplasm of breast: Secondary | ICD-10-CM

## 2021-07-08 DIAGNOSIS — Z1211 Encounter for screening for malignant neoplasm of colon: Secondary | ICD-10-CM

## 2021-07-08 DIAGNOSIS — Z1239 Encounter for other screening for malignant neoplasm of breast: Secondary | ICD-10-CM

## 2021-07-08 NOTE — Patient Instructions (Signed)
Explained breast self awareness with [facility]. Patient did not need a Pap smear today due to last Pap smear and HPV typing was 04/16/2019. Let her know BCCCP will cover Pap smears and HPV typing every 5 years unless has a history of abnormal Pap smears. Referred patient to the [place] for a screening mammogram on mobile unit. Appointment scheduled Thursday, July 08, 2021 at 1130. Patient aware of appointment and will be there. Let patient know the Breast Center will follow up with her within the next couple weeks with results of mammogram by letter or phone. [name] verbalized understanding. ? ?Theodore Virgin, Arvil Chaco, RN ?10:53 AM ? ? ? ? ?

## 2021-07-08 NOTE — Progress Notes (Signed)
[name] is a 46 y.o. female who presents to [facility] clinic today with complaint of right breast pain yesterday that only occurred one time that she rated at a 8.5 out of 10. ?  ?Pap Smear: Pap smear not completed today. Last Pap smear was 04/16/2019 at [facility] clinic and was normal with negative HPV. Per patient has no history of an abnormal Pap smear. Last Pap smear result is available in Epic. ?  ?Physical exam: ?Breasts ?Breasts symmetrical. No skin abnormalities bilateral breasts. No nipple retraction bilateral breasts. No nipple discharge bilateral breasts. No lymphadenopathy. No lumps palpated bilateral breasts. No complaints of pain or tenderness on exam.    ? ?MS DIGITAL SCREENING TOMO BILATERAL ? ?Result Date: 07/01/2020 ?CLINICAL DATA:  Screening. EXAM: DIGITAL SCREENING BILATERAL MAMMOGRAM WITH TOMOSYNTHESIS AND CAD TECHNIQUE: Bilateral screening digital craniocaudal and mediolateral oblique mammograms were obtained. Bilateral screening digital breast tomosynthesis was performed. The images were evaluated with computer-aided detection. COMPARISON:  Previous exam(s). ACR Breast Density Category b: There are scattered areas of fibroglandular density. FINDINGS: There are no findings suspicious for malignancy. The images were evaluated with computer-aided detection. IMPRESSION: No mammographic evidence of malignancy. A result letter of this screening mammogram will be mailed directly to the patient. RECOMMENDATION: Screening mammogram in one year. (Code:SM-B-01Y) BI-RADS CATEGORY  1: Negative. Electronically Signed   By: Lajean Manes M.D.   On: 07/01/2020 08:06  ? ?MS DIGITAL DIAG TOMO BILAT ? ?Addendum Date: 05/27/2019   ?ADDENDUM REPORT: 05/24/2019 12:29 ADDENDUM: Surgical consultation for evaluation of indeterminate LEFT breast clear nipple discharge has been arranged with Dr. Nedra Hai at [facility] on May 31, 2019. Terie Purser, RN on 05/24/2019. Electronically Signed   By:  Lovey Newcomer M.D.   On: 05/24/2019 12:29  ? ?Result Date: 05/27/2019 ?CLINICAL DATA:  Patient presents for evaluation of spontaneous clear left nipple discharge. EXAM: DIGITAL DIAGNOSTIC BILATERAL MAMMOGRAM WITH CAD AND TOMO ULTRASOUND LEFT BREAST COMPARISON:  None ACR Breast Density Category a: The breast tissue is almost entirely fatty. FINDINGS: No concerning masses, calcifications or distortion identified within the left or right breast. Mammographic images were processed with CAD. The patient is able to elicit a small amount of clear discharge from the left nipple with palpation. Targeted ultrasound is performed, showing no dilated duct or retroareolar mass within the left breast. There is a small 5 x 2 x 6 mm cyst left breast 10 o'clock position 2 cm from nipple. IMPRESSION: 1. Indeterminate clear spontaneous left nipple discharge. RECOMMENDATION: Bilateral breast MRI and surgical consultation for further evaluation of indeterminate left breast clear nipple discharge. I have discussed the findings and recommendations with the patient. If applicable, a reminder letter will be sent to the patient regarding the next appointment. BI-RADS CATEGORY  2: Benign. Electronically Signed: By: Lovey Newcomer M.D. On: 05/22/2019 14:10  ? ?MM CLIP PLACEMENT RIGHT ? ?Result Date: 07/09/2019 ?CLINICAL DATA:  Post ultrasound-guided biopsy of a mass in the right breast at 12 o'clock retroareolar. EXAM: DIAGNOSTIC RIGHT MAMMOGRAM POST ULTRASOUND BIOPSY COMPARISON:  Previous exams. FINDINGS: Mammographic images were obtained following ultrasound guided biopsy of a mass in the right breast at 12 o'clock retroareolar. A ribbon shaped biopsy marking clip is present at the site of the biopsied mass in the right breast at 12 o'clock retroareolar. IMPRESSION: Ribbon shaped biopsy marking clip at site of biopsied mass in the right breast at 12 o'clock retroareolar. Final Assessment: Post Procedure Mammograms for Marker Placement Electronically  Signed  By: Everlean Alstrom M.D.   On: 07/09/2019 14:44   ?  ?Pelvic/Bimanual ?Pap is not indicated today per BCCCP guidelines. ?  ?Smoking History: ?Patient has never smoked. ?  ?Patient Navigation: ?Patient education provided. Access to services provided for patient through [place] program. Spanish interpreter Rudene Anda from [facility] provided. Patient has food insecurities. Patient escorted to the food market at the [employer] for groceries.   ? ?Colorectal Cancer Screening: ?Per patient has had colonoscopy completed on 07/08/2019 at [place]. Patient stated she had a precancerous polyp removed and a repeat colonoscopy is due in 7 years. No complaints today.  ?  ?Breast and Cervical Cancer Risk Assessment: ?Patient does not have family history of breast cancer, known genetic mutations, or radiation treatment to the chest before age 27. Patient does not have history of cervical dysplasia, immunocompromised, or DES exposure in-utero. ? ?Risk Assessment   ? ? Risk Scores   ? ?   07/08/2021 06/30/2020  ? Last edited by: Demetrius Revel, LPN McGill, Karlton Lemon, LPN  ? 5-year risk: 0.5 % 0.5 %  ? Lifetime risk: 5.5 % 5.6 %  ? ?  ?  ? ?  ? ?A: ?BCCCP exam without pap smear ?No complaints. ? ?P: ?Referred patient to the [place] for a screening mammogram on mobile unit. Appointment scheduled Thursday, July 08, 2021 at 1130. ? ?Loletta Parish, RN ?07/08/2021 10:53 AM   ?

## 2021-07-12 ENCOUNTER — Other Ambulatory Visit: Payer: Self-pay | Admitting: Obstetrics and Gynecology

## 2021-07-12 DIAGNOSIS — R928 Other abnormal and inconclusive findings on diagnostic imaging of breast: Secondary | ICD-10-CM

## 2021-07-30 ENCOUNTER — Other Ambulatory Visit: Payer: Self-pay | Admitting: Obstetrics and Gynecology

## 2021-07-30 ENCOUNTER — Ambulatory Visit: Payer: No Typology Code available for payment source

## 2021-07-30 ENCOUNTER — Ambulatory Visit
Admission: RE | Admit: 2021-07-30 | Discharge: 2021-07-30 | Disposition: A | Discharge status: Home / Self Care | Payer: No Typology Code available for payment source | Source: Ambulatory Visit | Attending: Obstetrics and Gynecology | Admitting: Obstetrics and Gynecology

## 2021-07-30 DIAGNOSIS — R928 Other abnormal and inconclusive findings on diagnostic imaging of breast: Secondary | ICD-10-CM

## 2021-08-31 NOTE — Congregational Nurse Program (Signed)
  Dept: [phone]   Congregational Nurse Program Note  Date of Encounter: 08/31/2021  Past Medical History: Past Medical History:  Diagnosis Date   Chronic chest pain    Chronic pelvic pain in female    DDD (degenerative disc disease), cervical    Depression    Diabetes mellitus    Dizziness    Fatty liver    GERD (gastroesophageal reflux disease)    H/O echocardiogram 09/2013   normal   Headache    Hypertension    Peripheral neuropathy    Renal disorder     Encounter Details:  CNP Questionnaire - 08/31/21 1504       Questionnaire   Do you give verbal consent to treat you today? Yes    Location Patient [place] or Organization    Patient Status Unknown    Insurance Unknown    Insurance Referral N/A    Medication N/A    Medical Provider Yes    Screening Referrals Annual Wellness Visit    Medical Referral N/A    Medical Appointment Made N/A    Food N/A    Transportation N/A    Housing/Utilities N/A    Interpersonal Safety N/A    Intervention Blood pressure;Educate;Navigate Healthcare System    ED Visit Averted N/A    Life-Saving Intervention Made N/A             Patient her to get education on how to check her BP and how read her numbers. Patient states on June 10th 2023 she had a garden tool fell on her lower left leg. Patient instructed to go to ED or urgent care if she noticed swelling, discharge, has fevers.

## 2021-09-06 IMAGING — MG DIGITAL DIAGNOSTIC BILAT W/ TOMO W/ CAD
8 of 14 series · 8 of 40 positions shown · non-contrast
Comparison: None

ACR Breast Density Category a: The breast tissue is almost entirely
fatty.
COMPARISON: None

ACR Breast Density Category a: The breast tissue is almost entirely
fatty.

Addendum:
CLINICAL DATA: Patient presents for evaluation of spontaneous clear
left nipple discharge.

EXAM:
DIGITAL DIAGNOSTIC BILATERAL MAMMOGRAM WITH CAD AND TOMO
ULTRASOUND LEFT BREAST

[L MLO synth-2D (1 of 2)]
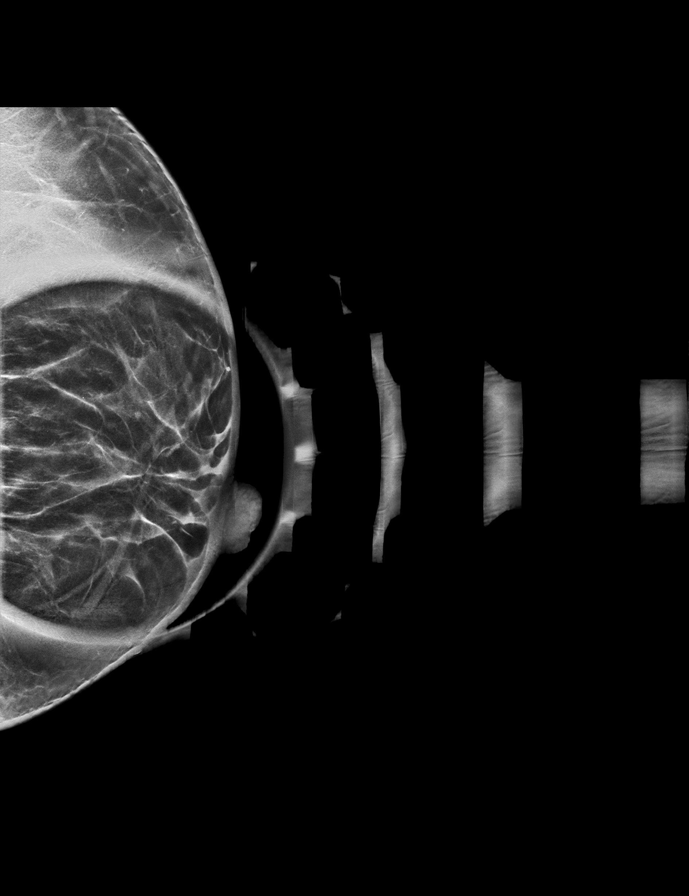

[L MLO synth-2D (2 of 2)]
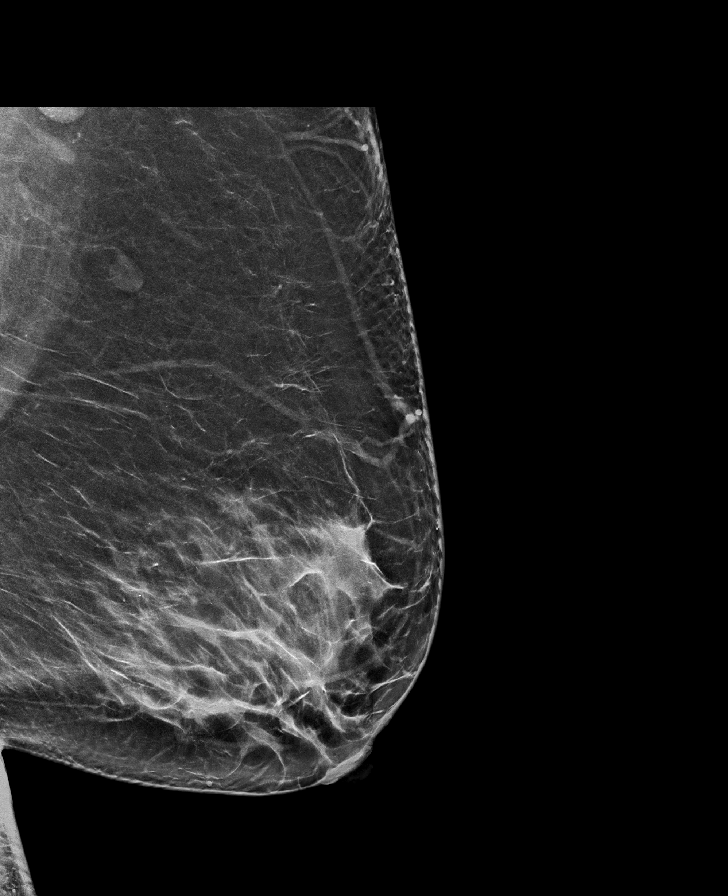

[L CV synth-2D]
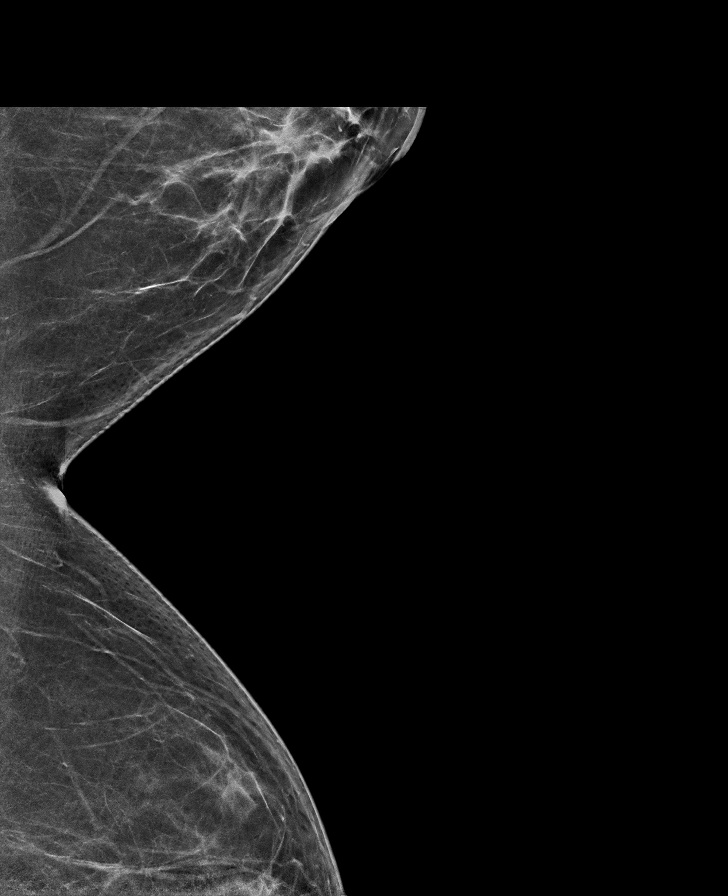

[L CC synth-2D]
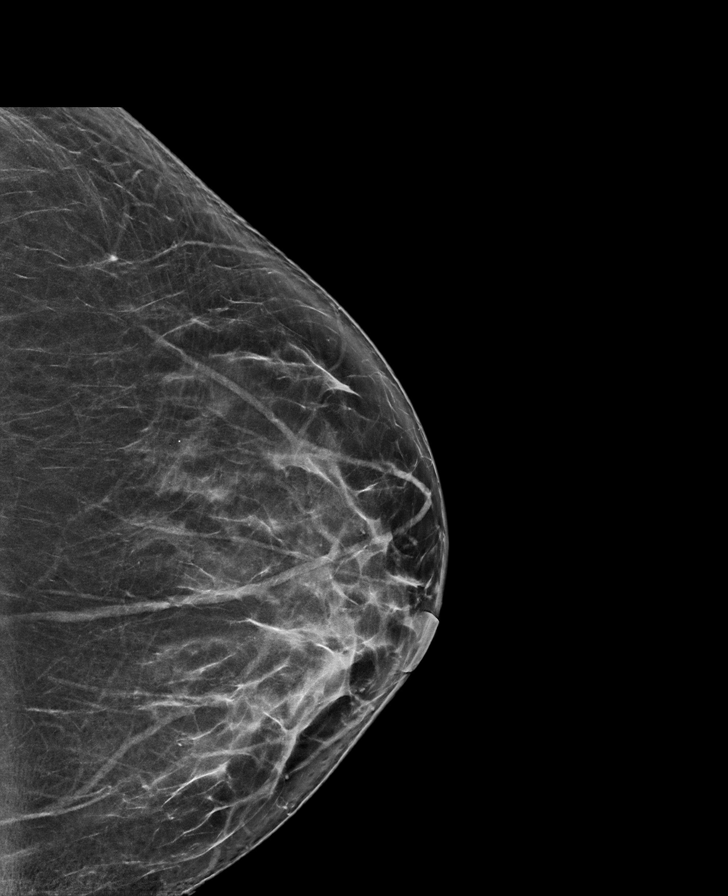

[R MLO synth-2D]
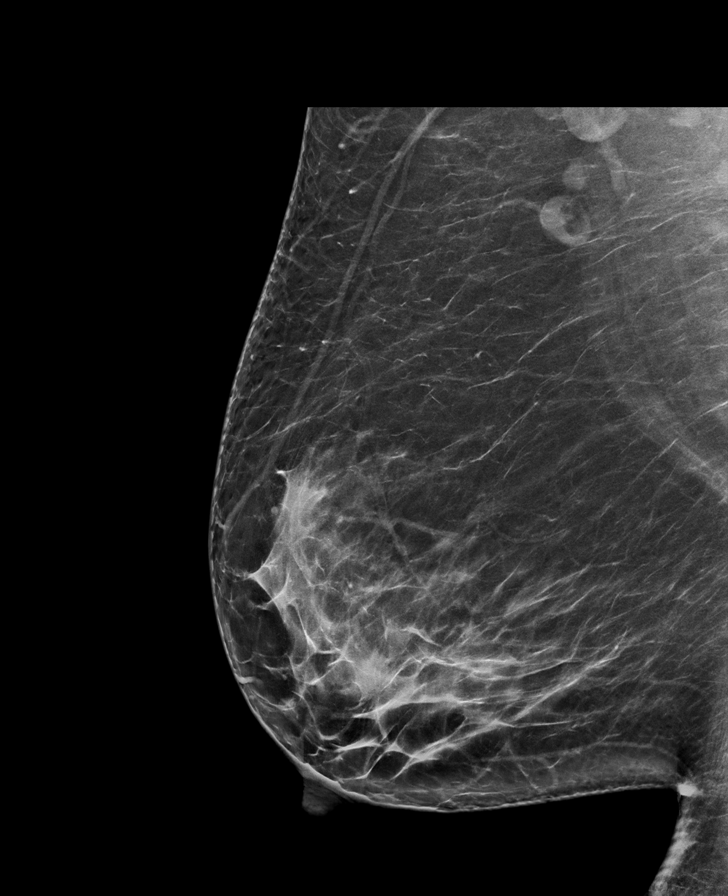

[R CC synth-2D (1 of 2)]
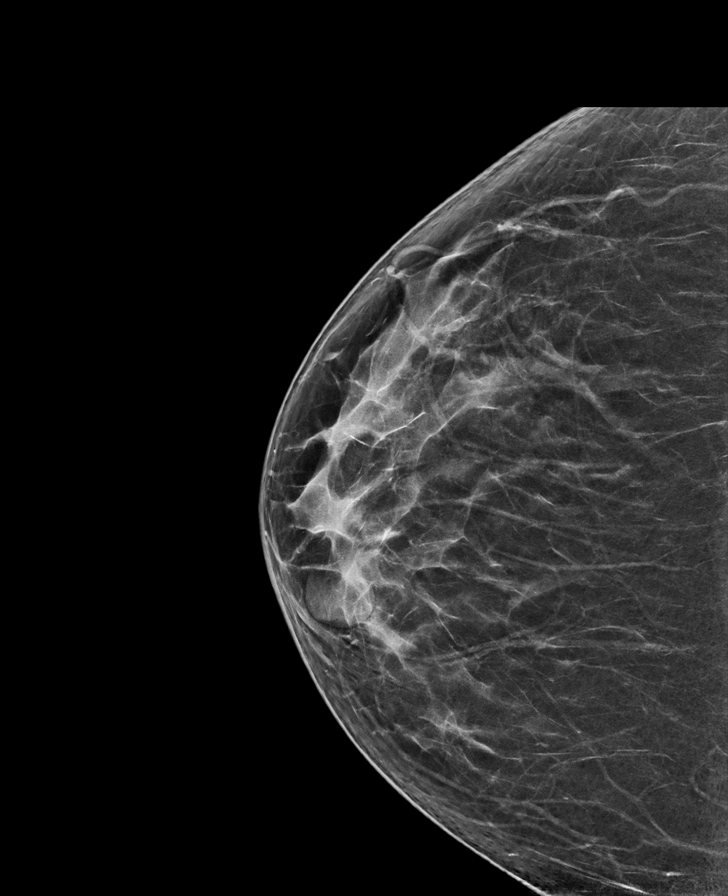

[R CC synth-2D (2 of 2)]
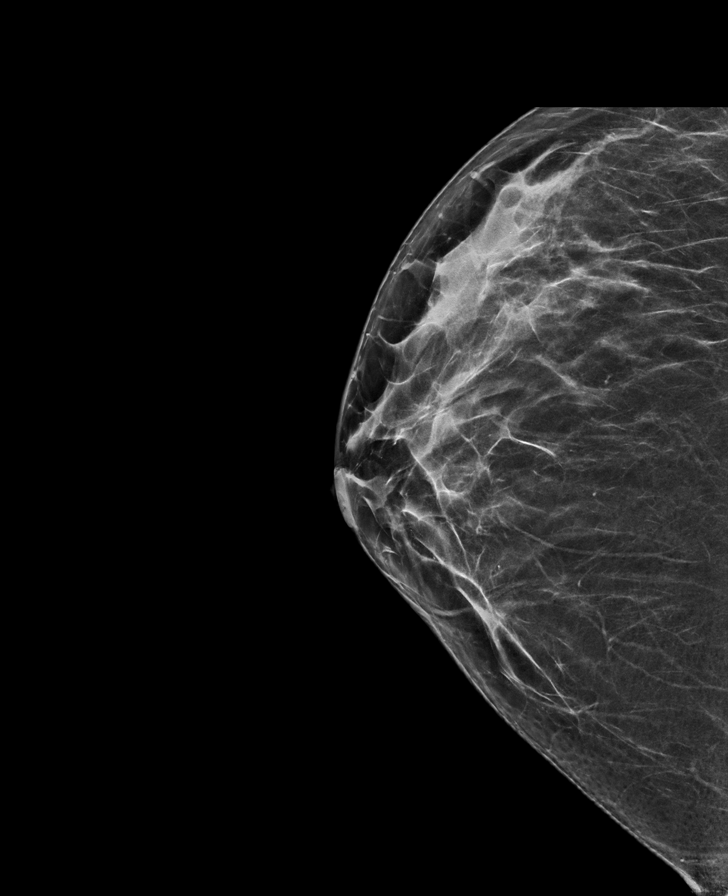

[R MLO tomo · tomo slice 45/89.0]
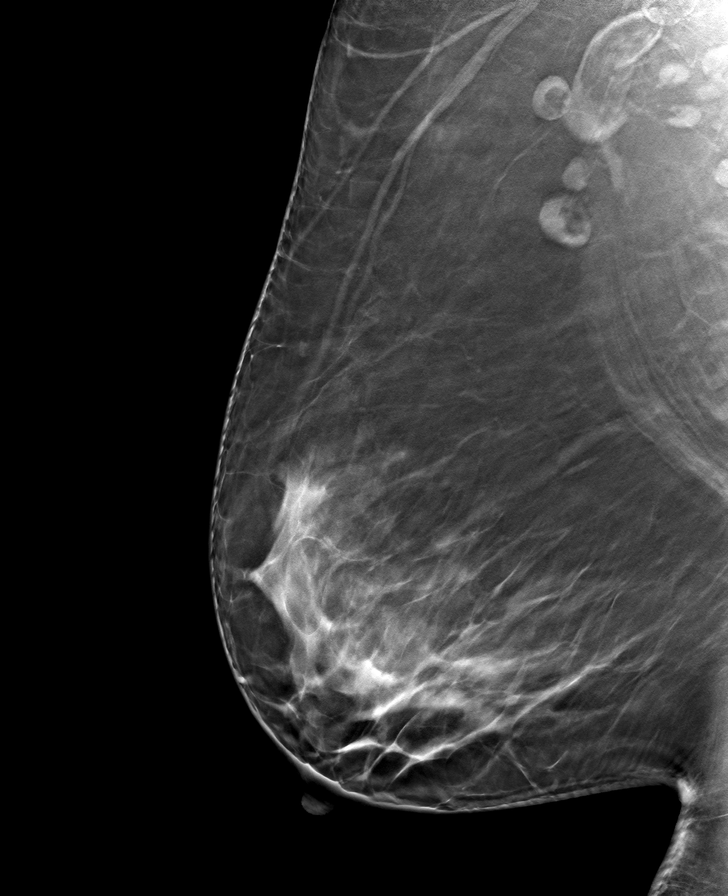

[8 of 40 positions shown; findings below may reference images not displayed]

FINDINGS: No concerning masses, calcifications or distortion identified within
the left or right breast.

Mammographic images were processed with CAD.

The patient is able to elicit a small amount of clear discharge from
the left nipple with palpation.

Targeted ultrasound is performed, showing no dilated duct or
retroareolar mass within the left breast. There is a small 5 x 2 x 6
mm cyst left breast 10 o'clock position 2 cm from nipple.
IMPRESSION: 1. Indeterminate clear spontaneous left nipple discharge.

RECOMMENDATION:
Bilateral breast MRI and surgical consultation for further
evaluation of indeterminate left breast clear nipple discharge.

I have discussed the findings and recommendations with the patient.
If applicable, a reminder letter will be sent to the patient
regarding the next appointment.

BI-RADS CATEGORY  2: Benign.

ADDENDUM:
Surgical consultation for evaluation of indeterminate LEFT breast
clear nipple discharge has been arranged with Dr. Audelio Thakar at
[REDACTED] on May 31, 2019.

Gamlet Rostova, RN on 05/24/2019.

*** End of Addendum ***
FINDINGS: No concerning masses, calcifications or distortion identified within
the left or right breast.

Mammographic images were processed with CAD.

The patient is able to elicit a small amount of clear discharge from
the left nipple with palpation.

Targeted ultrasound is performed, showing no dilated duct or
retroareolar mass within the left breast. There is a small 5 x 2 x 6
mm cyst left breast 10 o'clock position 2 cm from nipple.
IMPRESSION: 1. Indeterminate clear spontaneous left nipple discharge.

RECOMMENDATION:
Bilateral breast MRI and surgical consultation for further
evaluation of indeterminate left breast clear nipple discharge.

I have discussed the findings and recommendations with the patient.
If applicable, a reminder letter will be sent to the patient
regarding the next appointment.

BI-RADS CATEGORY  2: Benign.

## 2021-09-20 ENCOUNTER — Other Ambulatory Visit: Payer: Self-pay

## 2021-09-20 ENCOUNTER — Inpatient Hospital Stay: Payer: Self-pay | Attending: Obstetrics and Gynecology | Admitting: *Deleted

## 2021-09-20 VITALS — BP 128/90 | Ht 58.9 in | Wt 238.8 lb

## 2021-09-20 DIAGNOSIS — Z0000 Encounter for general adult medical examination without abnormal findings: Secondary | ICD-10-CM

## 2021-09-20 NOTE — Progress Notes (Signed)
Wisewoman initial [place], [employer]   Clinical Measurement: There were no vitals filed for this visit. Fasting Labs Drawn Today, will review with patient when they result.   Medical History:  Patient states that she  does not know if she has  high cholesterol,  does not know if she has  high blood pressure and she has diabetes.  Medications:  Patient states that she does take medication to lower cholesterol, blood pressure or blood sugar.  Patient does not take an aspirin a day to help prevent a heart attack or stroke. During the past 7 days patient has taken prescribed medication to lower blood sugar on 7 days.   Blood pressure, self measurement: Patient states that she does measure blood pressure from home. She checks her blood pressure monthly. She shares her readings with a health care provider: yes.   Nutrition: Patient states that on average she eats 1 cups of fruit and 3 cups of vegetables per day. Patient states that she does eat fish at least 2 times per week. Patient eats less than half servings of whole grains. Patient drinks less than 36 ounces of beverages with added sugar weekly: yes. Patient is currently watching sodium or salt intake: yes. In the past 7 days patient has consumed drinks containing alcohol on 0 days. On a day that patient consumes drinks containing alcohol on average 0 drinks are consumed.      Physical activity:  Patient states that she gets 60 minutes of moderate and 0 minutes of vigorous physical activity each week.  Smoking status:  Patient states that she has has never smoked .   Quality of life:  Over the past 2 weeks patient states that she had little interest or pleasure in doing things: not at all. She has been feeling down, depressed or hopeless:not at all.    Risk reduction and counseling:   Health Coaching: Spoke with patient about the daily recommendation for fruits and vegetables. Showed patient what a serving size looks  like.  Patient eats fish twice a week. Patient consumes whole wheat bread and oatmeal but not regularly. Patient has been watching the amount of beverages with added sugars due to her hx of diabetes. Patient has been walking 2 days a week for 30 minutes but has not been able to regularly. Spoke with patient about trying to do some chair exercises from home. Patient has pain in her joints which prevents her from exercising regularly. Showed patient chair exercises on Youtube that she can try to do.   Health Coaching: Patient would like to loose weight to help improve her health. Patient would like to loose 5 pounds over the next 3 months. Patient will work on this by choosing healthier food choices and by exercising.    Navigation:  I will notify patient of lab results.  Patient is aware of 2 more health coaching sessions and a follow up. Provided patient with a grab and go food bag from the [employer].  Time: 25 minutes

## 2021-09-21 LAB — LIPID PANEL
Chol/HDL Ratio: 3.9 ratio (ref 0.0–4.4)
Cholesterol, Total: 156 mg/dL (ref 100–199)
HDL: 40 mg/dL (ref >39)
LDL Chol Calc (NIH): 91 mg/dL (ref 0–99)
Triglycerides: 143 mg/dL (ref 0–149)
VLDL Cholesterol Cal: 25 mg/dL (ref 5–40)

## 2021-09-21 LAB — HEMOGLOBIN A1C
Est. average glucose Bld gHb Est-mCnc: 160 mg/dL
Hgb A1c MFr Bld: 7.2 % — ABNORMAL HIGH (ref 4.8–5.6)

## 2021-09-21 LAB — GLUCOSE, RANDOM: Glucose: 203 mg/dL — ABNORMAL HIGH (ref 70–99)

## 2021-09-30 ENCOUNTER — Telehealth: Payer: Self-pay

## 2021-09-30 NOTE — Telephone Encounter (Signed)
Health coaching 2   interpreter- Rudene Anda, [place]- 156 cholesterol, 91 LDL cholesterol, 143 triglycerides, 40 HDL cholesterol, 7.2 hemoglobin A1C, 203 mean plasma glucose. Patient understands and is aware of her lab results.   Goals- 1. Continue trying to practice a diabetes friendly diet. Watching the amount of sweets and sugars that patient consumes. Watch the amount of carbs consumed.  2. Try to start exercising daily for 20-30 minutes.   Navigation:  Patient is aware of 1 more health coaching sessions and a follow up. Patient is scheduled for FU with Internal Medicine on 10/05/21 @ 2:15 pm for elevated labs.   Time-  15 minutes

## 2021-10-05 ENCOUNTER — Other Ambulatory Visit: Payer: Self-pay

## 2021-10-05 ENCOUNTER — Ambulatory Visit (INDEPENDENT_AMBULATORY_CARE_PROVIDER_SITE_OTHER): Payer: Self-pay | Admitting: Student

## 2021-10-05 ENCOUNTER — Other Ambulatory Visit (HOSPITAL_COMMUNITY): Payer: Self-pay

## 2021-10-05 VITALS — BP 121/79 | HR 81 | Temp 98.6°F | Ht 58.5 in | Wt 239.9 lb

## 2021-10-05 DIAGNOSIS — E1165 Type 2 diabetes mellitus with hyperglycemia: Secondary | ICD-10-CM

## 2021-10-05 DIAGNOSIS — M545 Low back pain, unspecified: Secondary | ICD-10-CM

## 2021-10-05 DIAGNOSIS — Z794 Long term (current) use of insulin: Secondary | ICD-10-CM

## 2021-10-05 DIAGNOSIS — G8929 Other chronic pain: Secondary | ICD-10-CM

## 2021-10-05 MED ORDER — PEN NEEDLES 32G X 4 MM MISC
3 refills | Status: DC
  Filled 2021-10-05: qty 100, 100d supply, fill #0

## 2021-10-05 MED ORDER — LIRAGLUTIDE 18 MG/3ML ~~LOC~~ SOPN: 1.2000 mg | PEN_INJECTOR | Freq: Every day | SUBCUTANEOUS | 3 refills | Status: DC

## 2021-10-05 MED ORDER — LIRAGLUTIDE 18 MG/3ML ~~LOC~~ SOPN
PEN_INJECTOR | SUBCUTANEOUS | 3 refills | Status: DC
  Filled 2021-10-05: qty 6, 30d supply, fill #0

## 2021-10-05 NOTE — Assessment & Plan Note (Signed)
Patient endorses right-sided back pain for the past 2 years that has been consistent.  It is a sharp pain that is worse with movements.  She denies anything making the pain better.  She has tried Voltaren gel.  While in the room she is eating cranberries as well as showing me that she takes Azo.  It is associated with hematuria as well as burning when she urinates.  She denies any fevers or chills at this time.  She states in the past she has been seen by her primary care provider who prescribed her antibiotics and performed a urinalysis.  On physical exam she is tender to palpate on the right lower back.  This is different from her history of sciatica.  With how long this has been going on low suspicion that this is an infectious etiology but MSK.  I offered a urinalysis today that would cost her $30 however she declined.  She states that her primary care provider's office can do these lab tests for free I recommended she follow-up there sooner if possible.  We discussed that if the pain worsens or she has any other symptoms she needs to go to the emergency department as soon as possible.  Continue conservative management, suspect MSK Follow up with PCP If symptoms worsen advised to go to ED.

## 2021-10-05 NOTE — Assessment & Plan Note (Signed)
Patient presents today for follow-up of her diabetes.  Last A1c of 7.2%.  She is on a current regimen of pioglitazone as well as glipizide.  She was on insulin in the past however she has been unable to afford it recently.  Was also trialed on metformin but did not tolerate this secondary to GI side effects.  She notes that even though she was having significant GI side effects she continue to take this medicine for 20+ years.  She has a primary care physician that she follows with at [facility] [place].  She is unsure why she was instructed to follow-up in our clinic for her diabetes when they have been managing it well.  She states she appreciates her prior provider and their plan that they have been placed.  She is having frequent episodes of hypoglycemia on her glipizide.  She has follow-up with her primary provider in 2 weeks.  Because of these episodes of hypoglycemia will discontinue glipizide and add Victoza.  I believe a GLP-1 would be a good regimen for her as it would have the weight loss benefits as well.  We will start at 0.6 mg for 1 week and increase to 1.2 mg thereafter.  Patient instructed to follow-up with her primary care provider however she would like to continue following up in our clinic discussed coming back in 1 month for reevaluation after initiating these medicines.  Start Victoza 0.6 mg for a week and then uptitrate to 1.2 mg if tolerating well Continue pioglitazone Follow-up A1c in our clinic or with primary care provider in 3 months

## 2021-10-05 NOTE — Patient Instructions (Addendum)
Clayburn Pert, Ms.Shevaun Benito-Rosete por permitirnos brindarle atencin hoy. hoy discutimos  Diabetes Detenga la glipizida y comience a tomar la victoza. Tome Victoza 0,6 mg al da durante una semana y luego aumente a 1,2 mg al [employer].  Contine con su pioglitazona.  Si desea establecer contacto con nuestra clnica, vuelva en 1-2 meses.  He ordenado los siguientes laboratorios para usted:  Lab Orders  No laboratory test(s) ordered today     Pruebas ordenadas hoy:  Referencias ordenadas hoy:   Referral Orders         Amb Referral To Provider Referral Exercise Program (P.R.E.P)      He ordenado el siguiente medicamento/cambiado los siguientes medicamentos:  Suspender los siguientes medicamentos: Medications Discontinued During This Encounter  Medication Reason   insulin lispro (HUMALOG) 100 UNIT/ML KwikPen    Insulin Glargine (LANTUS SOLOSTAR) 100 UNIT/ML Solostar Pen    Insulin Pen Needle (PEN NEEDLES) 29G X 12MM MISC    metFORMIN (GLUCOPHAGE XR) 500 MG 24 hr tablet    glipiZIDE (GLUCOTROL) 10 MG tablet    liraglutide (VICTOZA) 18 MG/3ML SOPN      Iniciar los siguientes medicamentos:    Si tiene alguna pregunta o inquietud, llame a la clnica de medicina interna al [phone].     Sanjuana Letters, D.O. [place]

## 2021-10-05 NOTE — Progress Notes (Addendum)
CC: t2dm and right back pain  HPI:  Ms.[name] is a 46 y.o. female living with a history stated below and presents today for type 2 diabetes and right back pain. Please see problem based assessment and plan for additional details.  Past Medical History:  Diagnosis Date   Chronic chest pain    Chronic pelvic pain in female    DDD (degenerative disc disease), cervical    Depression    Diabetes mellitus    Dizziness    Fatty liver    GERD (gastroesophageal reflux disease)    H/O echocardiogram 09/2013   normal   Headache    Hypertension    Peripheral neuropathy    Renal disorder     Current Outpatient Medications on File Prior to Visit  Medication Sig Dispense Refill   blood glucose meter kit and supplies KIT Dispense based on patient and insurance preference. Use up to four times daily as directed. (FOR ICD-9 250.00, 250.01). 1 each 0   Blood Glucose Monitoring Suppl (TRUE METRIX METER) w/Device KIT Use to measure blood sugar twice a day 1 kit 0   ciprofloxacin (CIPRO) 500 MG tablet Take 1 tablet (500 mg total) by mouth 2 (two) times daily. (Patient not taking: Reported on 07/03/2019) 6 tablet 0   dicyclomine (BENTYL) 10 MG capsule Take 1-2 capsules (10-20 mg total) by mouth every 8 (eight) hours as needed for spasms. (Patient not taking: Reported on 09/20/2021) 60 capsule 3   glucose blood (TRUE METRIX BLOOD GLUCOSE TEST) test strip Use as instructed to check blood sugar up to 3 times per day 100 each 12   ibuprofen (ADVIL,MOTRIN) 200 MG tablet Take 400 mg by mouth every 6 (six) hours as needed. (Patient not taking: Reported on 09/20/2021)     lisinopril (ZESTRIL) 2.5 MG tablet Take 1 tablet (2.5 mg total) by mouth daily. (Patient not taking: Reported on 09/20/2021) 30 tablet 5   methocarbamol (ROBAXIN) 500 MG tablet Take 500 mg by mouth 3 (three) times daily as needed. (Patient not taking: Reported on 09/20/2021)     NEURONTIN 300 MG capsule Take 300 mg by mouth 2 (two)  times daily. (Patient not taking: Reported on 09/20/2021)     omeprazole (PRILOSEC) 40 MG capsule Take 1 capsule (40 mg total) by mouth 2 (two) times daily. For acid reflux (Patient not taking: Reported on 09/20/2021) 60 capsule 3   ondansetron (ZOFRAN) 8 MG tablet Take 1 tablet (8 mg total) by mouth every 8 (eight) hours as needed for nausea or vomiting. (Patient not taking: Reported on 09/20/2021) 20 tablet 0   pantoprazole (PROTONIX) 20 MG tablet Take 20 mg by mouth daily. (Patient not taking: Reported on 09/20/2021)     pioglitazone (ACTOS) 30 MG tablet Take 30 mg by mouth daily.     TRUEplus Lancets 28G MISC Use to measure blood sugar twice a day 100 each 1   Turmeric 450 MG CAPS SMARTSIG:1 Capsule(s) By Mouth Daily     VOLTAREN 1 % GEL SMARTSIG:Sparingly Topical 4 Times Daily PRN (Patient not taking: Reported on 09/20/2021)     No current facility-administered medications on file prior to visit.    Family History  Problem Relation Age of Onset   Hypertension Mother    Diabetes Father    Hypertension Sister    Diabetes Sister    Diabetes Daughter    Pancreatitis Daughter    Migraines Daughter    Asthma Daughter    Colon cancer Neg Hx  Esophageal cancer Neg Hx    Stomach cancer Neg Hx    Rectal cancer Neg Hx    Breast cancer Neg Hx     Social History   Socioeconomic History   Marital status: Soil scientist    Spouse name: Not on file   Number of children: 3   Years of education: Not on file   Highest education level: 11th grade  Occupational History   Not on file  Tobacco Use   Smoking status: Never   Smokeless tobacco: Never  Vaping Use   Vaping Use: Never used  Substance and Sexual Activity   Alcohol use: No   Drug use: No   Sexual activity: Yes    Birth control/protection: Surgical  Other Topics Concern   Not on file  Social History Narrative   ** Merged History Encounter **       Social Determinants of Health   Financial Resource Strain: Not on file   Food Insecurity: Food Insecurity Present (09/20/2021)   Hunger Vital Sign    Worried About Running Out of Food in the Last Year: Sometimes true    Ran Out of Food in the Last Year: Sometimes true  Transportation Needs: Unmet Transportation Needs (09/20/2021)   PRAPARE - Hydrologist (Medical): Yes    Lack of Transportation (Non-Medical): Yes  Physical Activity: Not on file  Stress: Not on file  Social Connections: Not on file  Intimate Partner Violence: Not on file    Review of Systems: ROS negative except for what is noted on the assessment and plan.  Vitals:   10/05/21 1402  BP: 121/79  Pulse: 81  Temp: 98.6 F (37 C)  TempSrc: Oral  SpO2: 99%  Weight: 239 lb 14.4 oz (108.8 kg)  Height: 4' 10.5" (1.486 m)   Physical Exam: Constitutional: in no acute distress HENT: normocephalic atraumatic, mucous membranes moist Eyes: conjunctiva non-erythematous Neck: supple Cardiovascular: regular rate and rhythm, 2/6 systolic murmur right 2nd intercostal space Pulmonary/Chest: normal work of breathing on room air MSK: normal bulk and tone Neurological: alert & oriented x 3 Skin: warm and dry Psych: normal mood  Assessment & Plan:   Uncontrolled type 2 diabetes mellitus with hyperglycemia, with long-term current use of insulin (HCC) Patient presents today for follow-up of her diabetes.  Last A1c of 7.2%.  She is on a current regimen of pioglitazone as well as glipizide.  She was on insulin in the past however she has been unable to afford it recently.  Was also trialed on metformin but did not tolerate this secondary to GI side effects.  She notes that even though she was having significant GI side effects she continue to take this medicine for 20+ years.  She has a primary care physician that she follows with at [facility] [place].  She is unsure why she was instructed to follow-up in our clinic for her diabetes when they have been managing it well.   She states she appreciates her prior provider and their plan that they have been placed.  She is having frequent episodes of hypoglycemia on her glipizide.  She has follow-up with her primary provider in 2 weeks.  Because of these episodes of hypoglycemia will discontinue glipizide and add Victoza.  I believe a GLP-1 would be a good regimen for her as it would have the weight loss benefits as well.  We will start at 0.6 mg for 1 week and increase to 1.2 mg thereafter.  Patient instructed to follow-up with her primary care provider however she would like to continue following up in our clinic discussed coming back in 1 month for reevaluation after initiating these medicines.  Start Victoza 0.6 mg for a week and then uptitrate to 1.2 mg if tolerating well Continue pioglitazone Follow-up A1c in our clinic or with primary care provider in 3 months  Right-sided back pain Patient endorses right-sided back pain for the past 2 years that has been consistent.  It is a sharp pain that is worse with movements.  She denies anything making the pain better.  She has tried Voltaren gel.  While in the room she is eating cranberries as well as showing me that she takes Azo.  It is associated with hematuria as well as burning when she urinates.  She denies any fevers or chills at this time.  She states in the past she has been seen by her primary care provider who prescribed her antibiotics and performed a urinalysis.  On physical exam she is tender to palpate on the right lower back.  This is different from her history of sciatica.  With how long this has been going on low suspicion that this is an infectious etiology but MSK.  I offered a urinalysis today that would cost her $30 however she declined.  She states that her primary care provider's office can do these lab tests for free I recommended she follow-up there sooner if possible.  We discussed that if the pain worsens or she has any other symptoms she needs to go to  the emergency department as soon as possible.  Continue conservative management, suspect MSK Follow up with PCP If symptoms worsen advised to go to ED.   Patient discussed with Dr. Caffie Damme, D.O. [facility], PGY-3 Phone: [phone] Date 10/05/2021 Time 5:15 PM

## 2021-10-07 NOTE — Progress Notes (Signed)
Internal Medicine Clinic Attending  Case discussed with Dr. Katsadouros  At the time of the visit.  We reviewed the resident's history and exam and pertinent patient test results.  I agree with the assessment, diagnosis, and plan of care documented in the resident's note.  

## 2021-10-08 ENCOUNTER — Telehealth: Payer: Self-pay

## 2021-10-08 NOTE — Telephone Encounter (Signed)
Call to pt reference PREP referral Daughter usually helps with interpreter but is not home currently. Will call pt back when I have an interpreter available so she can fully understand

## 2021-11-07 ENCOUNTER — Emergency Department (HOSPITAL_COMMUNITY): Payer: No Typology Code available for payment source

## 2021-11-07 ENCOUNTER — Other Ambulatory Visit: Payer: Self-pay

## 2021-11-07 ENCOUNTER — Emergency Department (HOSPITAL_COMMUNITY)
Admission: EM | Admit: 2021-11-07 | Discharge: 2021-11-08 | Disposition: A | Discharge status: Home / Self Care | Payer: No Typology Code available for payment source | Attending: Emergency Medicine | Admitting: Emergency Medicine

## 2021-11-07 ENCOUNTER — Encounter (HOSPITAL_COMMUNITY): Payer: Self-pay | Admitting: Emergency Medicine

## 2021-11-07 DIAGNOSIS — Z79899 Other long term (current) drug therapy: Secondary | ICD-10-CM | POA: Insufficient documentation

## 2021-11-07 DIAGNOSIS — N3000 Acute cystitis without hematuria: Secondary | ICD-10-CM | POA: Insufficient documentation

## 2021-11-07 DIAGNOSIS — Z794 Long term (current) use of insulin: Secondary | ICD-10-CM | POA: Insufficient documentation

## 2021-11-07 DIAGNOSIS — I10 Essential (primary) hypertension: Secondary | ICD-10-CM | POA: Insufficient documentation

## 2021-11-07 DIAGNOSIS — Z7984 Long term (current) use of oral hypoglycemic drugs: Secondary | ICD-10-CM | POA: Insufficient documentation

## 2021-11-07 DIAGNOSIS — E1140 Type 2 diabetes mellitus with diabetic neuropathy, unspecified: Secondary | ICD-10-CM | POA: Insufficient documentation

## 2021-11-07 LAB — CBC WITH DIFFERENTIAL/PLATELET
Abs Immature Granulocytes: 0.03 10*3/uL (ref 0.00–0.07)
Basophils Absolute: 0.1 10*3/uL (ref 0.0–0.1)
Basophils Relative: 1 %
Eosinophils Absolute: 0.2 10*3/uL (ref 0.0–0.5)
Eosinophils Relative: 3 %
HCT: 40.2 % (ref 36.0–46.0)
Hemoglobin: 13.6 g/dL (ref 12.0–15.0)
Immature Granulocytes: 0 %
Lymphocytes Relative: 29 %
Lymphs Abs: 2 10*3/uL (ref 0.7–4.0)
MCH: 28.1 pg (ref 26.0–34.0)
MCHC: 33.8 g/dL (ref 30.0–36.0)
MCV: 83.1 fL (ref 80.0–100.0)
Monocytes Absolute: 0.5 10*3/uL (ref 0.1–1.0)
Monocytes Relative: 7 %
Neutro Abs: 4.1 10*3/uL (ref 1.7–7.7)
Neutrophils Relative %: 60 %
Platelets: 224 10*3/uL (ref 150–400)
RBC: 4.84 MIL/uL (ref 3.87–5.11)
RDW: 12.7 % (ref 11.5–15.5)
WBC: 6.9 10*3/uL (ref 4.0–10.5)
nRBC: 0 % (ref 0.0–0.2)

## 2021-11-07 LAB — I-STAT BETA HCG BLOOD, ED (MC, WL, AP ONLY): I-stat hCG, quantitative: <5 m[IU]/mL (ref <5)

## 2021-11-07 MED ORDER — OXYCODONE-ACETAMINOPHEN 5-325 MG PO TABS
1.0000 | ORAL_TABLET | Freq: Once | ORAL | Status: AC
  Administered 2021-11-07: 1 via ORAL
  Filled 2021-11-07: qty 1

## 2021-11-07 NOTE — ED Triage Notes (Signed)
Patient with flank pain for one week, nausea no vomiting.  Hematuria and dysuria.

## 2021-11-07 NOTE — ED Provider Triage Note (Cosign Needed Addendum)
Emergency Medicine Provider Triage Evaluation Note  Hx provided by patient with the assistance of Spanish interpreter # [phone]. [name] [place] , a 46 y.o. female  was evaluated in triage.  Pt complains of dysuria, right flank pain, and nausea.Hematuria, pain worsening today. .  Review of Systems  Positive: Back pain, nausea, hematuria, dysuria, HA Negative: Vomiting  Physical Exam  BP 133/77   Pulse 78   Temp 98 F (36.7 C) (Oral)   Resp 18   SpO2 97%  Gen:   Awake, no distress   Resp:  Normal effort  MSK:   Moves extremities without difficulty  Other:  right CVAT, suprapubic TTP, RRR no m/r/g  Medical Decision Making  Medically screening exam initiated at 11:21 PM.  Appropriate orders placed.  [name] was informed that the remainder of the evaluation will be completed by another provider, this initial triage assessment does not replace that evaluation, and the importance of remaining in the ED until their evaluation is complete.  Workup initiated.   This chart was dictated using voice recognition software, Dragon. Despite the best efforts of this provider to proofread and correct errors, errors may still occur which can change documentation meaning.    Emeline Darling, PA-C 11/07/21 2327    [name], [name], PA-C 11/07/21 2330

## 2021-11-08 ENCOUNTER — Emergency Department (HOSPITAL_COMMUNITY): Payer: No Typology Code available for payment source

## 2021-11-08 LAB — COMPREHENSIVE METABOLIC PANEL
ALT: 24 U/L (ref 0–44)
AST: 24 U/L (ref 15–41)
Albumin: 3.7 g/dL (ref 3.5–5.0)
Alkaline Phosphatase: 106 U/L (ref 38–126)
Anion gap: 5 (ref 5–15)
BUN: 13 mg/dL (ref 6–20)
CO2: 31 mmol/L (ref 22–32)
Calcium: 9.6 mg/dL (ref 8.9–10.3)
Chloride: 102 mmol/L (ref 98–111)
Creatinine, Ser: 0.59 mg/dL (ref 0.44–1.00)
GFR, Estimated: >60 mL/min (ref >60)
Glucose, Bld: 171 mg/dL — ABNORMAL HIGH (ref 70–99)
Potassium: 4.2 mmol/L (ref 3.5–5.1)
Sodium: 138 mmol/L (ref 135–145)
Total Bilirubin: 0.5 mg/dL (ref 0.3–1.2)
Total Protein: 7.4 g/dL (ref 6.5–8.1)

## 2021-11-08 LAB — URINALYSIS, ROUTINE W REFLEX MICROSCOPIC
Bilirubin Urine: NEGATIVE
Glucose, UA: NEGATIVE mg/dL
Hgb urine dipstick: NEGATIVE
Ketones, ur: NEGATIVE mg/dL
Nitrite: POSITIVE — AB
Protein, ur: 100 mg/dL — AB
Specific Gravity, Urine: 1.021 (ref 1.005–1.030)
WBC, UA: >50 WBC/hpf — ABNORMAL HIGH (ref 0–5)
pH: 7 (ref 5.0–8.0)

## 2021-11-08 MED ORDER — CEPHALEXIN 500 MG PO CAPS: 500.0000 mg | ORAL_CAPSULE | Freq: Four times a day (QID) | ORAL | 0 refills | Status: AC

## 2021-11-08 MED ORDER — CEPHALEXIN 250 MG PO CAPS
500.0000 mg | ORAL_CAPSULE | Freq: Once | ORAL | Status: AC
  Administered 2021-11-08: 500 mg via ORAL
  Filled 2021-11-08: qty 2

## 2021-11-08 MED ORDER — ONDANSETRON 4 MG PO TBDP: 4.0000 mg | ORAL_TABLET | Freq: Three times a day (TID) | ORAL | 0 refills | Status: DC | PRN

## 2021-11-08 NOTE — ED Provider Notes (Signed)
[facility] EMERGENCY DEPARTMENT Provider Note   CSN: [id number] Arrival date & time: 11/07/21  2132     History  Chief Complaint  Patient presents with   Flank Pain    [name] is a 46 y.o. female.  46 year old female presents today for evaluation of dysuria, right-sided flank pain.  She states this has been an intermittent issue for several months.  This particular episode however started on Saturday.  Has not priorly been seen for this issue.  Denies vomiting but endorses some nausea.  Denies fever.  Denies vaginal discharge, change in sexual partners.  The history is provided by the patient. No language interpreter was used.       Home Medications Prior to Admission medications   Medication Sig Start Date End Date Taking? Authorizing Provider  blood glucose meter kit and supplies KIT Dispense based on patient and insurance preference. Use up to four times daily as directed. (FOR ICD-9 250.00, 250.01). 08/11/16   Marina Goodell A, MD  Blood Glucose Monitoring Suppl (TRUE METRIX METER) w/Device KIT Use to measure blood sugar twice a day 02/26/19   Elsie Stain, MD  ciprofloxacin (CIPRO) 500 MG tablet Take 1 tablet (500 mg total) by mouth 2 (two) times daily. Patient not taking: Reported on 07/03/2019 06/12/19   Fulp, Ander Gaster, MD  dicyclomine (BENTYL) 10 MG capsule Take 1-2 capsules (10-20 mg total) by mouth every 8 (eight) hours as needed for spasms. Patient not taking: Reported on 09/20/2021 07/02/19   Yetta Flock, MD  glucose blood (TRUE METRIX BLOOD GLUCOSE TEST) test strip Use as instructed to check blood sugar up to 3 times per day 04/17/19   Fulp, Cammie, MD  ibuprofen (ADVIL,MOTRIN) 200 MG tablet Take 400 mg by mouth every 6 (six) hours as needed. Patient not taking: Reported on 09/20/2021    [provider]  Insulin Pen Needle (PEN NEEDLES) 32G X 4 MM MISC Use as directed daily. 10/05/21   Katsadouros, Vasilios, MD  liraglutide  (VICTOZA) 18 MG/3ML SOPN Inject 0.6 mg into the skin daily for 7 days, THEN 1.2 mg daily. 10/05/21 11/11/21  Katsadouros, Vasilios, MD  lisinopril (ZESTRIL) 2.5 MG tablet Take 1 tablet (2.5 mg total) by mouth daily. Patient not taking: Reported on 09/20/2021 03/06/19   Elsie Stain, MD  methocarbamol (ROBAXIN) 500 MG tablet Take 500 mg by mouth 3 (three) times daily as needed. Patient not taking: Reported on 09/20/2021 06/14/21   [provider]  NEURONTIN 300 MG capsule Take 300 mg by mouth 2 (two) times daily. Patient not taking: Reported on 09/20/2021 06/14/21   [provider]  omeprazole (PRILOSEC) 40 MG capsule Take 1 capsule (40 mg total) by mouth 2 (two) times daily. For acid reflux Patient not taking: Reported on 09/20/2021 06/12/19   Fulp, Cammie, MD  ondansetron (ZOFRAN) 8 MG tablet Take 1 tablet (8 mg total) by mouth every 8 (eight) hours as needed for nausea or vomiting. Patient not taking: Reported on 09/20/2021 05/22/19   Argentina Donovan, PA-C  pantoprazole (PROTONIX) 20 MG tablet Take 20 mg by mouth daily. Patient not taking: Reported on 09/20/2021    [provider]  pioglitazone (ACTOS) 30 MG tablet Take 30 mg by mouth daily.    [provider]  TRUEplus Lancets 28G MISC Use to measure blood sugar twice a day 03/06/19   Elsie Stain, MD  Turmeric 450 MG CAPS SMARTSIG:1 Capsule(s) By Mouth Daily 07/12/21   [provider]  VOLTAREN 1 % GEL SMARTSIG:Sparingly Topical 4 Times Daily PRN Patient not taking: Reported on 09/20/2021 04/01/21   [provider]      Allergies    Patient has no known allergies.    Review of Systems   Review of Systems  Constitutional:  Negative for chills and fever.  Gastrointestinal:  Positive for nausea. Negative for abdominal pain and vomiting.  Genitourinary:  Positive for dysuria and flank pain. Negative for vaginal bleeding and vaginal discharge.  Neurological:  Positive for headaches. Negative  for syncope and light-headedness.  All other systems reviewed and are negative.   Physical Exam Updated Vital Signs BP 110/64   Pulse 71   Temp 98.6 F (37 C) (Oral)   Resp 17   SpO2 97%  Physical Exam Vitals and nursing note reviewed.  Constitutional:      General: She is not in acute distress.    Appearance: Normal appearance. She is not ill-appearing.  HENT:     Head: Normocephalic and atraumatic.     Nose: Nose normal.  Eyes:     General: No scleral icterus.    Extraocular Movements: Extraocular movements intact.     Conjunctiva/sclera: Conjunctivae normal.  Cardiovascular:     Rate and Rhythm: Normal rate and regular rhythm.     Pulses: Normal pulses.  Pulmonary:     Effort: Pulmonary effort is normal. No respiratory distress.     Breath sounds: Normal breath sounds. No wheezing or rales.  Abdominal:     General: There is no distension.     Tenderness: There is no abdominal tenderness. There is right CVA tenderness. There is no left CVA tenderness or guarding.  Musculoskeletal:        General: Normal range of motion.     Cervical back: Normal range of motion.  Skin:    General: Skin is warm and dry.  Neurological:     General: No focal deficit present.     Mental Status: She is alert. Mental status is at baseline.     ED Results / Procedures / Treatments   Labs (all labs ordered are listed, but only abnormal results are displayed) Labs Reviewed  COMPREHENSIVE METABOLIC PANEL - Abnormal; Notable for the following components:      Result Value   Glucose, Bld 171 (*)    All other components within normal limits  CBC WITH DIFFERENTIAL/PLATELET  URINALYSIS, ROUTINE W REFLEX MICROSCOPIC  I-STAT BETA HCG BLOOD, ED ([facility], [facility], [facility] ONLY)    EKG None  Radiology CT Renal Stone Study  Result Date: 11/08/2021 CLINICAL DATA:  Flank pain EXAM: CT ABDOMEN AND PELVIS WITHOUT CONTRAST TECHNIQUE: Multidetector CT imaging of the abdomen and pelvis was performed following  the standard protocol without IV contrast. RADIATION DOSE REDUCTION: This exam was performed according to the departmental dose-optimization program which includes automated exposure control, adjustment of the mA and/or kV according to patient size and/or use of iterative reconstruction technique. COMPARISON:  11/12/2017 FINDINGS: Lower chest: Lung bases are free of acute infiltrate or sizable effusion. Hepatobiliary: No focal liver abnormality is seen. Status post cholecystectomy. No biliary dilatation. Pancreas: Unremarkable. No pancreatic ductal dilatation or surrounding inflammatory changes. Spleen: Normal in size without focal abnormality. Adrenals/Urinary Tract: Adrenal glands are within normal limits. Kidneys demonstrate a normal appearance without renal calculi or obstructive changes. The ureters are unremarkable. Bladder is decompressed. Stomach/Bowel: The appendix is within normal limits. No obstructive or inflammatory changes of the colon are seen. Stomach and  small bowel are within normal limits. Vascular/Lymphatic: No significant vascular findings are present. No enlarged abdominal or pelvic lymph nodes. Reproductive: Uterus and bilateral adnexa are unremarkable. Other: No abdominal wall hernia or abnormality. No abdominopelvic ascites. Musculoskeletal: No acute or significant osseous findings. IMPRESSION: No acute abnormality noted. Overall appearance is similar to that seen on the prior exam. Electronically Signed   By: Inez Catalina M.D.   On: 11/08/2021 00:39    Procedures Procedures    Medications Ordered in ED Medications  oxyCODONE-acetaminophen (PERCOCET/ROXICET) 5-325 MG per tablet 1 tablet (1 tablet Oral Given 11/07/21 2336)    ED Course/ Medical Decision Making/ A&P                           Medical Decision Making Risk Prescription drug management.   Medical Decision Making / ED Course   This patient presents to the ED for concern of dysuria, flank pain, this involves an  extensive number of treatment options, and is a complaint that carries with it a high risk of complications and morbidity.  The differential diagnosis includes kidney stone, pyelonephritis, UTI  MDM: 46 year old female presents today for evaluation of UTI type symptoms going on for past 3 days.  Work-up reveals CBC without leukocytosis or anemia.  CMP with elevated glucose otherwise without acute findings.  CT renal stone study without acute findings.  UA demonstrates positive leukocyte Estrace, nitrate.  Greater than 50 WBCs.  Keflex dose given in the emergency room.  Keflex and Zofran prescribed.  Patient is appropriate for discharge.  Discharged in stable condition.  Return precautions discussed.  Lab Tests: -I ordered, reviewed, and interpreted labs.   The pertinent results include:   Labs Reviewed  COMPREHENSIVE METABOLIC PANEL - Abnormal; Notable for the following components:      Result Value   Glucose, Bld 171 (*)    All other components within normal limits  CBC WITH DIFFERENTIAL/PLATELET  URINALYSIS, ROUTINE W REFLEX MICROSCOPIC  I-STAT BETA HCG BLOOD, ED ([facility], [facility], [facility] ONLY)      EKG  EKG Interpretation  Date/Time:    Ventricular Rate:    PR Interval:    QRS Duration:   QT Interval:    QTC Calculation:   R Axis:     Text Interpretation:           Imaging Studies ordered: I ordered imaging studies including CT renal stone study I independently visualized and interpreted imaging. I agree with the radiologist interpretation   Medicines ordered and prescription drug management: Meds ordered this encounter  Medications   oxyCODONE-acetaminophen (PERCOCET/ROXICET) 5-325 MG per tablet 1 tablet    -I have reviewed the patients home medicines and have made adjustments as needed  Co morbidities that complicate the patient evaluation  Past Medical History:  Diagnosis Date   Chronic chest pain    Chronic pelvic pain in female    DDD (degenerative disc disease),  cervical    Depression    Diabetes mellitus    Dizziness    Fatty liver    GERD (gastroesophageal reflux disease)    H/O echocardiogram 09/2013   normal   Headache    Hypertension    Peripheral neuropathy    Renal disorder       Dispostion: Patient is appropriate for discharge.  Discharged in stable condition.  Return precautions discussed.  Final Clinical Impression(s) / ED Diagnoses Final diagnoses:  Acute cystitis without hematuria    Rx /  DC Orders ED Discharge Orders          Ordered    cephALEXin (KEFLEX) 500 MG capsule  4 times daily        11/08/21 1434    ondansetron (ZOFRAN-ODT) 4 MG disintegrating tablet  Every 8 hours PRN        11/08/21 1434              Evlyn Courier, PA-C 11/08/21 1438    Davonna Belling, MD 11/09/21 [phone]

## 2021-11-08 NOTE — Discharge Instructions (Addendum)
Your work-up today showed that you have a UTI.  You received a dose of antibiotic in the emergency room.  Additional antibiotic and nausea medication has been sent into your pharmacy.  Your blood work, CT scan did not show any concerning cause of your pain.  If you have any worsening symptoms please return to the emergency room.  Otherwise follow-up with your primary care doctor at your scheduled appointment tomorrow.  Tu examen de hoy mostr que tienes una ITU. Recibiste una dosis de antibitico en la sala de Multimedia programmer. Se han enviado a su farmacia medicamentos antibiticos y para las nuseas adicionales. Su anlisis de [place] y su tomografa computarizada no mostraron ninguna causa preocupante de Psychiatric nurse. Si tiene algn sntoma que empeora, regrese a Designer, jewellery. De lo contrario, haga un seguimiento con su mdico de atencin primaria en su cita programada maana.

## 2021-11-08 NOTE — ED Notes (Addendum)
RN sent UA to lab

## 2021-11-10 LAB — URINE CULTURE: Culture: >=100000 — AB

## 2021-11-11 ENCOUNTER — Telehealth: Payer: Self-pay | Admitting: *Deleted

## 2021-11-11 NOTE — Telephone Encounter (Signed)
Post ED Visit - Positive Culture Follow-up: Unsuccessful Patient Follow-up  Culture assessed and recommendations reviewed by:  '[]'$  Elenor Quinones, Pharm.D. '[]'$  Heide Guile, Pharm.D., BCPS AQ-ID '[]'$  Parks Neptune, Pharm.D., BCPS '[]'$  Alycia Rossetti, Pharm.D., BCPS '[]'$  [place], [place].D., BCPS, AAHIVP '[]'$  Legrand Como, Pharm.D., BCPS, AAHIVP '[]'$  Wynell Balloon, PharmD '[]'$  Vincenza Hews, PharmD, BCPS  Positive urine culture  '[]'$  Patient discharged without antimicrobial prescription and treatment is now indicated '[x]'$  Organism is resistant to prescribed ED discharge antimicrobial '[]'$  Patient with positive blood cultures  Stop Cephalexin.  Start Macrobid '100mg'$  PO q12 hrs x 7 days, Soijett Piperton, PA  Unable to contact patient after 3 attempts, letter will be sent to address on file  Ardeen Fillers 11/11/2021, 9:46 AM

## 2021-11-11 NOTE — Progress Notes (Signed)
ED Antimicrobial Stewardship Positive Culture Follow Up   [name] is an 46 y.o. female who presented to [facility] on 11/07/2021 with a chief complaint of  Chief Complaint  Patient presents with   Flank Pain    Recent Results (from the past 720 hour(s))  Urine Culture     Status: Abnormal   Collection Time: 11/08/21  2:39 PM   Specimen: Urine, Clean Catch  Result Value Ref Range Status   Specimen Description URINE, CLEAN CATCH  Final   Special Requests   Final    NONE Performed at [facility], [address], [place], [place] [address]    Culture (A)  Final    >=100,000 COLONIES/mL ESCHERICHIA COLI Confirmed Extended Spectrum Beta-Lactamase Producer (ESBL).  In bloodstream infections from ESBL organisms, carbapenems are preferred over piperacillin/tazobactam. They are shown to have a lower risk of mortality.    Report Status 11/10/2021 FINAL  Final   Organism ID, Bacteria ESCHERICHIA COLI (A)  Final      Susceptibility   Escherichia coli - MIC*    AMPICILLIN >=32 RESISTANT Resistant     CEFAZOLIN >=64 RESISTANT Resistant     CEFEPIME 8 INTERMEDIATE Intermediate     CEFTRIAXONE >=64 RESISTANT Resistant     CIPROFLOXACIN >=4 RESISTANT Resistant     GENTAMICIN <=1 SENSITIVE Sensitive     IMIPENEM <=0.25 SENSITIVE Sensitive     NITROFURANTOIN <=16 SENSITIVE Sensitive     TRIMETH/SULFA >=320 RESISTANT Resistant     AMPICILLIN/SULBACTAM 16 INTERMEDIATE Intermediate     PIP/TAZO <=4 SENSITIVE Sensitive     * >=100,000 COLONIES/mL ESCHERICHIA COLI    '[x]'$  Treated with keflex, organism resistant to prescribed antimicrobial '[]'$  Patient discharged originally without antimicrobial agent and treatment is now indicated  Patient with R flank pain and dysuria. CT renal w/out contrast unremarkable. Given flank pain discussed pyelo vs. Cystitis with CT imaging since macrobid unable to be used for pyelo and resistance to bactrim and cipro. Per PA ok to treat as cystitis  given CT results.   New antibiotic prescription: Stop keflex. Start macrobid '100mg'$  by mouth twice daily x 7 days.   ED Provider: Steva Colder, PA-C   Cristela Felt, PharmD, BCPS Clinical Pharmacist 11/11/2021 8:08 AM  Monday - Friday phone -  [phone] Saturday - Sunday phone - [phone]

## 2021-12-08 ENCOUNTER — Telehealth: Payer: Self-pay

## 2021-12-08 NOTE — Telephone Encounter (Signed)
Left message for patient about completing HC 3 for the [facility] program. Left name and number for patient to call back. 

## 2021-12-21 NOTE — Congregational Nurse Program (Signed)
  Dept: [phone]   Congregational Nurse Program Note  Date of Encounter: 12/21/2021  Past Medical History: Past Medical History:  Diagnosis Date   Chronic chest pain    Chronic pelvic pain in female    DDD (degenerative disc disease), cervical    Depression    Diabetes mellitus    Dizziness    Fatty liver    GERD (gastroesophageal reflux disease)    H/O echocardiogram 09/2013   normal   Headache    Hypertension    Peripheral neuropathy    Renal disorder     Encounter Details:  CNP Questionnaire - 12/21/21 1502       Questionnaire   Ask client: Do you give verbal consent for me to treat you today? Yes    Insurance underwriter    Location Patient Served  [facility]    Visit Setting with Client Organization    Patient Status Unknown    Insurance Uninsured (Orange Card/Care Connects/Self-Pay/Medicaid Family Planning)    Insurance/Financial Assistance Referral N/A    Medication N/A    Medical Provider Yes    Screening Referrals Made N/A    Medical Referrals Made [facility] PCP/Clinic    Medical Appointment Made N/A    Recently w/o PCP, now 1st time PCP visit completed due to CNs referral or appointment made N/A    Food N/A    Transportation N/A    Housing/Utilities N/A    Interpersonal Safety N/A    Interventions N/A    Abnormal to Normal Screening Since Last CN Visit N/A    Screenings CN Performed N/A    Sent Client to Lab for: N/A    Did client attend any of the following based off CNs referral or appointments made? N/A    ED Visit Averted N/A    Life-Saving Intervention Made N/A             Patient here to discuss burning upon urination, patient sent to PCP to be seen and to get screened. Patient stated she would go checked out .

## 2022-01-05 ENCOUNTER — Ambulatory Visit: Payer: No Typology Code available for payment source

## 2022-01-05 DIAGNOSIS — Z23 Encounter for immunization: Secondary | ICD-10-CM

## 2022-01-31 ENCOUNTER — Ambulatory Visit
Admission: RE | Admit: 2022-01-31 | Discharge: 2022-01-31 | Disposition: A | Discharge status: Home / Self Care | Payer: No Typology Code available for payment source | Source: Ambulatory Visit | Attending: Obstetrics and Gynecology | Admitting: Obstetrics and Gynecology

## 2022-01-31 ENCOUNTER — Other Ambulatory Visit: Payer: Self-pay | Admitting: Obstetrics and Gynecology

## 2022-01-31 DIAGNOSIS — R928 Other abnormal and inconclusive findings on diagnostic imaging of breast: Secondary | ICD-10-CM

## 2022-03-08 ENCOUNTER — Ambulatory Visit (HOSPITAL_COMMUNITY)
Admission: EM | Admit: 2022-03-08 | Discharge: 2022-03-08 | Disposition: A | Discharge status: Home / Self Care | Payer: No Typology Code available for payment source

## 2022-03-29 DIAGNOSIS — E1149 Type 2 diabetes mellitus with other diabetic neurological complication: Secondary | ICD-10-CM

## 2022-03-29 LAB — GLUCOSE, POCT (MANUAL RESULT ENTRY): POC Glucose: 224 mg/dl — AB (ref 70–99)

## 2022-03-29 NOTE — Congregational Nurse Program (Signed)
  Dept: [phone]   Congregational Nurse Program Note  Date of Encounter: 03/29/2022  Past Medical History: Past Medical History:  Diagnosis Date   Chronic chest pain    Chronic pelvic pain in female    DDD (degenerative disc disease), cervical    Depression    Diabetes mellitus    Dizziness    Fatty liver    GERD (gastroesophageal reflux disease)    H/O echocardiogram 09/2013   normal   Headache    Hypertension    Peripheral neuropathy    Renal disorder     Encounter Details:  CNP Questionnaire - 03/29/22 2041       Questionnaire   Ask client: Do you give verbal consent for me to treat you today? Yes    Insurance underwriter    Location Patient Served  [facility]    Visit Setting with Client Organization    Patient Status Unknown    Insurance Uninsured ([place] Card/Care Connects/Self-Pay/Medicaid Family Planning)    Insurance/Financial Assistance Referral [employer] Card/Care Connects    Medication N/A    Medical Provider Yes    Screening Referrals Made N/A    Medical Referrals Made [facility]    Medical Appointment Made N/A    Recently w/o PCP, now 1st time PCP visit completed due to CNs referral or appointment made N/A    Food N/A    Transportation N/A    Housing/Utilities N/A    Interpersonal Safety N/A    Interventions N/A    Abnormal to Normal Screening Since Last CN Visit N/A    Screenings CN Performed Blood Glucose    Sent Client to Lab for: N/A    Did client attend any of the following based off CNs referral or appointments made? N/A    ED Visit Averted N/A    Life-Saving Intervention Made N/A           POC 224. Client wants GI referral, informed client this needs to come from PCP. Provided info of previous GI (Dr. Enis Gash at [facility]). Currently not taking meds d/t N/V.States she has intermittent abdominal pain.   PCP- sees [facility] residency clinic (provided # to cal for  appt)

## 2022-05-06 LAB — HEMOGLOBIN A1C: Hemoglobin A1C: 11.2

## 2022-05-06 LAB — AMB RESULTS CONSOLE CBG: Glucose, Fasting: 374

## 2022-05-06 NOTE — Progress Notes (Signed)
Pt fasting

## 2022-05-10 NOTE — Congregational Nurse Program (Signed)
  Dept: [phone]   Congregational Nurse Program Note  Date of Encounter: 05/10/2022  Past Medical History: Past Medical History:  Diagnosis Date   Chronic chest pain    Chronic pelvic pain in female    DDD (degenerative disc disease), cervical    Depression    Diabetes mellitus    Dizziness    Fatty liver    GERD (gastroesophageal reflux disease)    H/O echocardiogram 09/2013   normal   Headache    Hypertension    Peripheral neuropathy    Renal disorder     Encounter Details:  CNP Questionnaire - 05/10/22 0956       Questionnaire   Ask client: Do you give verbal consent for me to treat you today? Yes    Student Assistance N/A    Location Patient Served  [facility]    Visit Setting with Client Phone/Text/Email    Patient Status Unknown    Insurance Unknown    Insurance/Financial Assistance Referral N/A    Medication N/A    Medical Provider Yes    Screening Referrals Made Annual Wellness Visit    Medical Referrals Made [facility]    Medical Appointment Made N/A    Recently w/o PCP, now 1st time PCP visit completed due to CNs referral or appointment made N/A    Food N/A    Transportation N/A    Housing/Utilities N/A    Interpersonal Safety N/A    Interventions N/A    Abnormal to Normal Screening Since Last CN Visit N/A    Screenings CN Performed N/A    Sent Client to Lab for: N/A    Did client attend any of the following based off CNs referral or appointments made? N/A    ED Visit Averted N/A    Life-Saving Intervention Made N/A            Client informed she needs to make an appt with PCP for high A1C of 11.2, verbalized understanding.  Client called to follow up on A1C of 11.2. States she called PCP at [facility] clinic to make an appointment, she said they will be calling her back with an appt. States she is working with Derrick Ravel, a Product/process development scientist at [employer] action to finish applying for her orange card. Client has a glucometer at  home.

## 2022-07-28 ENCOUNTER — Ambulatory Visit: Payer: Self-pay | Admitting: Hematology and Oncology

## 2022-07-28 VITALS — Wt 209.0 lb

## 2022-07-28 DIAGNOSIS — R921 Mammographic calcification found on diagnostic imaging of breast: Secondary | ICD-10-CM

## 2022-07-28 NOTE — Progress Notes (Signed)
[name] is a 47 y.o. female who presents to [facility] clinic today with no complaints. Follow up probable benign right breast calcification.    Pap Smear: Pap not smear completed today. Last Pap smear was 04/16/2019 and was normal. Per patient has no history of an abnormal Pap smear. Last Pap smear result is available in Epic.   Physical exam: Breasts Breasts symmetrical. No skin abnormalities bilateral breasts. No nipple retraction bilateral breasts. No nipple discharge bilateral breasts. No lymphadenopathy. No lumps palpated bilateral breasts.  MS DIGITAL DIAG TOMO UNI RIGHT  Result Date: 01/31/2022 CLINICAL DATA:  First six-month follow-up for probably benign calcifications in the RIGHT breast. EXAM: DIGITAL DIAGNOSTIC UNILATERAL RIGHT MAMMOGRAM WITH TOMOSYNTHESIS TECHNIQUE: Right digital diagnostic mammography and breast tomosynthesis was performed. COMPARISON:  07/30/2021 and earlier ACR Breast Density Category b: There are scattered areas of fibroglandular density. FINDINGS: Magnified views are performed of calcifications in the LOWER OUTER QUADRANT of the RIGHT breast. These views demonstrate loosely grouped round calcifications without suspicious morphology or distribution. No suspicious mass, distortion, or microcalcifications are identified to suggest presence of malignancy. IMPRESSION: Stable, probably benign RIGHT breast calcifications. RECOMMENDATION: Bilateral diagnostic mammogram in 6 months. I have discussed the findings and recommendations with the patient with the assistance of an interpreter. If applicable, a reminder letter will be sent to the patient regarding the next appointment. BI-RADS CATEGORY  3: Probably benign. Electronically Signed   By: Norva Pavlov M.D.   On: 01/31/2022 08:59  MS DIGITAL DIAG TOMO BILAT  Result Date: 07/30/2021 CLINICAL DATA:  47 year old female recalled from screening mammogram dated 07/08/2021 for right breast calcifications and  possible left breast asymmetry. EXAM: DIGITAL DIAGNOSTIC BILATERAL MAMMOGRAM WITH TOMOSYNTHESIS AND CAD TECHNIQUE: Bilateral digital diagnostic mammography and breast tomosynthesis was performed. The images were evaluated with computer-aided detection. COMPARISON:  Previous exam(s). ACR Breast Density Category b: There are scattered areas of fibroglandular density. FINDINGS: Loosely grouped punctate calcifications in the central lateral right breast at mid depth have a probably benign morphology. Possible asymmetry in the slightly lateral left breast at mid depth on the CC projection resolves into well dispersed fibroglandular tissue on additional views. IMPRESSION: 1. Probably benign right breast calcifications. Recommend short-term follow-up. 2. No mammographic evidence of malignancy on the left. RECOMMENDATION: Diagnostic right breast mammogram in 6 months. I have discussed the findings and recommendations with the patient. If applicable, a reminder letter will be sent to the patient regarding the next appointment. BI-RADS CATEGORY  3: Probably benign. Electronically Signed   By: Sande Brothers M.D.   On: 07/30/2021 10:37  MS DIGITAL SCREENING TOMO BILATERAL  Result Date: 07/09/2021 CLINICAL DATA:  Screening. EXAM: DIGITAL SCREENING BILATERAL MAMMOGRAM WITH TOMOSYNTHESIS AND CAD TECHNIQUE: Bilateral screening digital craniocaudal and mediolateral oblique mammograms were obtained. Bilateral screening digital breast tomosynthesis was performed. The images were evaluated with computer-aided detection. COMPARISON:  Previous exam(s). ACR Breast Density Category b: There are scattered areas of fibroglandular density. FINDINGS: In the right breast calcifications requires further evaluation. In the left breast asymmetry requires further evaluation. IMPRESSION: Further evaluation is suggested for possible calcifications in the right breast. Further evaluation is suggested for possible asymmetry in the left breast.  RECOMMENDATION: Diagnostic mammogram and possibly ultrasound of both breasts. (Code:FI-B-56M) The patient will be contacted regarding the findings, and additional imaging will be scheduled. BI-RADS CATEGORY  0: Incomplete. Need additional imaging evaluation and/or prior mammograms for comparison. Electronically Signed   By: Annia Belt M.D.   On: 07/09/2021 14:29  MS DIGITAL SCREENING TOMO BILATERAL  Result Date: 07/01/2020 CLINICAL DATA:  Screening. EXAM: DIGITAL SCREENING BILATERAL MAMMOGRAM WITH TOMOSYNTHESIS AND CAD TECHNIQUE: Bilateral screening digital craniocaudal and mediolateral oblique mammograms were obtained. Bilateral screening digital breast tomosynthesis was performed. The images were evaluated with computer-aided detection. COMPARISON:  Previous exam(s). ACR Breast Density Category b: There are scattered areas of fibroglandular density. FINDINGS: There are no findings suspicious for malignancy. The images were evaluated with computer-aided detection. IMPRESSION: No mammographic evidence of malignancy. A result letter of this screening mammogram will be mailed directly to the patient. RECOMMENDATION: Screening mammogram in one year. (Code:[id number]) BI-RADS CATEGORY  1: Negative. Electronically Signed   By: Amie Portland M.D.   On: 07/01/2020 08:06   MM CLIP PLACEMENT RIGHT  Result Date: 07/09/2019 CLINICAL DATA:  Post ultrasound-guided biopsy of a mass in the right breast at 12 o'clock retroareolar. EXAM: DIAGNOSTIC RIGHT MAMMOGRAM POST ULTRASOUND BIOPSY COMPARISON:  Previous exams. FINDINGS: Mammographic images were obtained following ultrasound guided biopsy of a mass in the right breast at 12 o'clock retroareolar. A ribbon shaped biopsy marking clip is present at the site of the biopsied mass in the right breast at 12 o'clock retroareolar. IMPRESSION: Ribbon shaped biopsy marking clip at site of biopsied mass in the right breast at 12 o'clock retroareolar. Final Assessment: Post Procedure  Mammograms for Marker Placement Electronically Signed   By: Edwin Cap M.D.   On: 07/09/2019 14:44   MS DIGITAL DIAG TOMO BILAT  Addendum Date: 05/27/2019   ADDENDUM REPORT: 05/24/2019 12:29 ADDENDUM: Surgical consultation for evaluation of indeterminate LEFT breast clear nipple discharge has been arranged with Dr. Carman Ching at [facility] on May 31, 2019. Rene Kocher, RN on 05/24/2019. Electronically Signed   By: Annia Belt M.D.   On: 05/24/2019 12:29   Result Date: 05/27/2019 CLINICAL DATA:  Patient presents for evaluation of spontaneous clear left nipple discharge. EXAM: DIGITAL DIAGNOSTIC BILATERAL MAMMOGRAM WITH CAD AND TOMO ULTRASOUND LEFT BREAST COMPARISON:  None ACR Breast Density Category a: The breast tissue is almost entirely fatty. FINDINGS: No concerning masses, calcifications or distortion identified within the left or right breast. Mammographic images were processed with CAD. The patient is able to elicit a small amount of clear discharge from the left nipple with palpation. Targeted ultrasound is performed, showing no dilated duct or retroareolar mass within the left breast. There is a small 5 x 2 x 6 mm cyst left breast 10 o'clock position 2 cm from nipple. IMPRESSION: 1. Indeterminate clear spontaneous left nipple discharge. RECOMMENDATION: Bilateral breast MRI and surgical consultation for further evaluation of indeterminate left breast clear nipple discharge. I have discussed the findings and recommendations with the patient. If applicable, a reminder letter will be sent to the patient regarding the next appointment. BI-RADS CATEGORY  2: Benign. Electronically Signed: By: Annia Belt M.D. On: 05/22/2019 14:10         Pelvic/Bimanual Pap is not indicated today    Smoking History: Patient has never smoked and was not referred to quit line.    Patient Navigation: Patient education provided. Access to services provided for patient through [facility]CP program. Natale Lay interpreter provided. No transportation provided   Colorectal Cancer Screening: Per patient had colonoscopy completed 07/08/2019 revealing serrated polyp with features of a sessile serrated polyp. No complaints today.   Breast and Cervical Cancer Risk Assessment: Patient does not have family history of breast cancer, known genetic mutations, or radiation treatment to the chest before age  30. Patient has history of cervical dysplasia, immunocompromised, or DES exposure in-utero.  Risk Assessment   No risk assessment data for the current encounter  Risk Scores       07/08/2021   Last edited by: Narda Rutherford, LPN   5-year risk: 0.5 %   Lifetime risk: 5.5 %            A: [facility] exam without pap smear No complaints with benign exam. Follow up probable benign right breast calcifications.   P: Referred patient to the [facility] for a diagnostic mammogram. Appointment scheduled 08/01/22.  Pascal Lux, NP 07/28/2022 10:43 AM

## 2022-07-28 NOTE — Patient Instructions (Signed)
Taught [name] how to perform BSE and gave educational materials to take home. Patient did not need a Pap smear today due to last Pap smear was in 04/16/2019 per patient. Let her know [facility] will cover Pap smears every 5 years unless has a history of abnormal Pap smears. Referred patient to the [facility] for diagnostic mammogram. Appointment scheduled for 08/01/2022. Patient aware of appointment and will be there. Let patient know will follow up with her within the next couple weeks with results. [name] verbalized understanding.  Pascal Lux, NP 10:44 AM

## 2022-08-01 ENCOUNTER — Ambulatory Visit
Admission: RE | Admit: 2022-08-01 | Discharge: 2022-08-01 | Disposition: A | Discharge status: Home / Self Care | Payer: No Typology Code available for payment source | Source: Ambulatory Visit | Attending: Obstetrics and Gynecology | Admitting: Obstetrics and Gynecology

## 2022-08-01 DIAGNOSIS — R928 Other abnormal and inconclusive findings on diagnostic imaging of breast: Secondary | ICD-10-CM

## 2022-08-23 NOTE — Congregational Nurse Program (Signed)
  Dept: [phone]   Congregational Nurse Program Note  Date of Encounter: 08/23/2022  Past Medical History: Past Medical History:  Diagnosis Date   Chronic chest pain    Chronic pelvic pain in female    DDD (degenerative disc disease), cervical    Depression    Diabetes mellitus    Dizziness    Fatty liver    GERD (gastroesophageal reflux disease)    H/O echocardiogram 09/2013   normal   Headache    Hypertension    Peripheral neuropathy    Renal disorder     Encounter Details:  CNP Questionnaire - 08/23/22 1437       Questionnaire   Ask client: Do you give verbal consent for me to treat you today? Yes    Actuary Nurse    Location Patient Served  [employer]    Visit Setting with Client Organization    Patient Status Migrant    Insurance Uninsured ([employer] Card/Care Connects/Self-Pay/Medicaid Family Planning)    Insurance/Financial Assistance Referral [employer] Card/Care Connects    Medication Have Medication Insecurities    Medical Provider Yes    Screening Referrals Made Annual Wellness Visit    Medical Referrals Made [facility]    Medical Appointment Made [facility]    Recently w/o PCP, now 1st time PCP visit completed due to CNs referral or appointment made N/A    Food N/A    Transportation N/A    Housing/Utilities N/A    Interpersonal Safety N/A    Interventions N/A    Abnormal to Normal Screening Since Last CN Visit N/A    Screenings CN Performed N/A    Sent Client to Lab for: N/A    Did client attend any of the following based off CNs referral or appointments made? N/A    ED Visit Averted N/A    Life-Saving Intervention Made N/A             Client here needing help setting up a pcp appointment, no appt available for this month or next. States she wants to go to mobile bus for a check up instead.

## 2022-11-08 NOTE — Congregational Nurse Program (Signed)
  Dept: [phone]   Congregational Nurse Program Note  Date of Encounter: 11/08/2022  Past Medical History: Past Medical History:  Diagnosis Date   Chronic chest pain    Chronic pelvic pain in female    DDD (degenerative disc disease), cervical    Depression    Diabetes mellitus    Dizziness    Fatty liver    GERD (gastroesophageal reflux disease)    H/O echocardiogram 09/2013   normal   Headache    Hypertension    Peripheral neuropathy    Renal disorder     Encounter Details:  CNP Questionnaire - 11/08/22 1419       Questionnaire   Ask client: Do you give verbal consent for me to treat you today? 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    Location Patient Presenter, [place]    Visit Setting with Client Organization    Patient Status Migrant    Insurance Uninsured ([employer] Card/Care Connects/Self-Pay/Medicaid Family Planning)    Insurance/Financial Assistance Referral [employer] Card/Care Connects    Medication Have Medication Insecurities    Medical Provider Yes    Screening Referrals Made Annual Wellness Visit    Medical Referrals Made [facility]    Medical Appointment Made [facility]    Recently w/o PCP, now 1st time PCP visit completed due to CNs referral or appointment made N/A    Food N/A    Transportation N/A    Housing/Utilities N/A    Interpersonal Safety N/A    Interventions N/A    Abnormal to Normal Screening Since Last CN Visit N/A    Screenings CN Performed N/A    Sent Client to Lab for: N/A    Did client attend any of the following based off CNs referral or appointments made? N/A    ED Visit Averted N/A    Life-Saving Intervention Made N/A            Attempt call, no answer. Called to discuss adult diaper needs.

## 2022-12-06 ENCOUNTER — Encounter: Payer: Self-pay | Admitting: Student

## 2022-12-06 NOTE — Congregational Nurse Program (Signed)
  Dept: [phone]   Congregational Nurse Program Note  Date of Encounter: 12/06/2022  Past Medical History: Past Medical History:  Diagnosis Date   Chronic chest pain    Chronic pelvic pain in female    DDD (degenerative disc disease), cervical    Depression    Diabetes mellitus    Dizziness    Fatty liver    GERD (gastroesophageal reflux disease)    H/O echocardiogram 09/2013   normal   Headache    Hypertension    Peripheral neuropathy    Renal disorder     Encounter Details:  CNP Questionnaire - 12/06/22 1714       Questionnaire   Ask client: Do you give verbal consent for me to treat you today? 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    Location Patient Presenter, [place]    Visit Setting with Client Organization    Patient Status Migrant    Insurance Uninsured ([employer] Card/Care Connects/Self-Pay/Medicaid Family Planning)    Insurance/Financial Assistance Referral [employer] Card/Care Connects    Medication Have Medication Insecurities    Medical Provider Yes    Screening Referrals Made Annual Wellness Visit    Medical Referrals Made [facility]    Medical Appointment Made [facility]    Recently w/o PCP, now 1st time PCP visit completed due to CNs referral or appointment made N/A    Food N/A    Transportation N/A    Housing/Utilities N/A    Interpersonal Safety N/A    Interventions N/A    Abnormal to Normal Screening Since Last CN Visit N/A    Screenings CN Performed N/A    Sent Client to Lab for: N/A    Did client attend any of the following based off CNs referral or appointments made? N/A    ED Visit Averted N/A    Life-Saving Intervention Made N/A            Client here for mobile bus schedule and for adult diapers, and diapers for her child.

## 2022-12-27 NOTE — Congregational Nurse Program (Signed)
  Dept: [phone]   Congregational Nurse Program Note  Date of Encounter: 12/27/2022  Past Medical History: Past Medical History:  Diagnosis Date   Chronic chest pain    Chronic pelvic pain in female    DDD (degenerative disc disease), cervical    Depression    Diabetes mellitus    Dizziness    Fatty liver    GERD (gastroesophageal reflux disease)    H/O echocardiogram 09/2013   normal   Headache    Hypertension    Peripheral neuropathy    Renal disorder     Encounter Details:  Community Questionnaire - 12/27/22 1436       Questionnaire   Ask client: Do you give verbal consent for me to treat you today? Yes    Student Assistance N/A    Location Patient Served  [place];[employer]    Encounter Setting CN site    Population Status Unknown    Insurance Uninsured ([employer] Card/Care Connects/Self-Pay/Medicaid Family Planning)    Insurance/Financial Assistance Referral N/A    Medication N/A    Medical Provider Yes    Screening Referrals Made N/A    Medical Referrals Made N/A    Medical Appointment Completed N/A    CNP Interventions Case Management    Screenings CN Performed N/A    ED Visit Averted N/A    Life-Saving Intervention Made N/A             Client here for adult diapers, no adult diapers here at agency but instructed to come back next Tuesday to pick up diapers, baby wipes provided.

## 2023-01-17 ENCOUNTER — Ambulatory Visit: Payer: No Typology Code available for payment source

## 2023-01-17 DIAGNOSIS — Z23 Encounter for immunization: Secondary | ICD-10-CM

## 2023-01-31 NOTE — Congregational Nurse Program (Signed)
  Dept: [phone]   Congregational Nurse Program Note  Date of Encounter: 01/31/2023  Past Medical History: Past Medical History:  Diagnosis Date   Chronic chest pain    Chronic pelvic pain in female    DDD (degenerative disc disease), cervical    Depression    Diabetes mellitus    Dizziness    Fatty liver    GERD (gastroesophageal reflux disease)    H/O echocardiogram 09/2013   normal   Headache    Hypertension    Peripheral neuropathy    Renal disorder     Encounter Details:  Community Questionnaire - 01/31/23 1645       Questionnaire   Ask client: Do you give verbal consent for me to treat you today? Yes    Herbalist Nurse    Location Patient Presenter, [place]    Encounter Setting CN site    Population Status Unknown    Insurance Uninsured ([employer] Card/Care Connects/Self-Pay/Medicaid Family Planning)    Insurance/Financial Assistance Referral N/A    Medication N/A    Medical Provider Yes    Screening Referrals Made N/A    Medical Referrals Made N/A    Medical Appointment Completed N/A    CNP Interventions Advocate/Support    Screenings CN Performed N/A    ED Visit Averted N/A    Life-Saving Intervention Made N/A             Client here to obtain orange card applications for her family. Applications given.

## 2023-02-07 NOTE — Congregational Nurse Program (Signed)
  Dept: [phone]   Congregational Nurse Program Note  Date of Encounter: 02/07/2023  Past Medical History: Past Medical History:  Diagnosis Date   Chronic chest pain    Chronic pelvic pain in female    DDD (degenerative disc disease), cervical    Depression    Diabetes mellitus    Dizziness    Fatty liver    GERD (gastroesophageal reflux disease)    H/O echocardiogram 09/2013   normal   Headache    Hypertension    Peripheral neuropathy    Renal disorder     Encounter Details:  Community Questionnaire - 02/07/23 1513       Questionnaire   Ask client: Do you give verbal consent for me to treat you today? Yes    Student Assistance N/A    Location Patient Served  [employer]    Encounter Setting CN site    Population Status Unknown    Insurance Uninsured ([employer] Card/Care Connects/Self-Pay/Medicaid Family Planning)    Insurance/Financial Assistance Referral N/A    Medication N/A    Medical Provider Yes    Screening Referrals Made N/A    Medical Referrals Made N/A    Medical Appointment Completed N/A    CNP Interventions Case Management    Screenings CN Performed N/A    ED Visit Averted N/A    Life-Saving Intervention Made N/A            Client in need for adult diapers, client instructed to come back next week for adult diapers. Diapers will be obtained from [place].

## 2023-03-21 NOTE — Congregational Nurse Program (Signed)
  Dept: [phone]   Congregational Nurse Program Note  Date of Encounter: 03/21/2023  Past Medical History: Past Medical History:  Diagnosis Date   Chronic chest pain    Chronic pelvic pain in female    DDD (degenerative disc disease), cervical    Depression    Diabetes mellitus    Dizziness    Fatty liver    GERD (gastroesophageal reflux disease)    H/O echocardiogram 09/2013   normal   Headache    Hypertension    Peripheral neuropathy    Renal disorder     Encounter Details:  Community Questionnaire - 03/21/23 1711       Questionnaire   Ask client: Do you give verbal consent for me to treat you today? Yes    Student Assistance N/A    Location Patient Presenter, [place]    Encounter Setting CN site    Population Status Unknown    Insurance Uninsured ([employer] Card/Care Connects/Self-Pay/Medicaid Family Planning)    Insurance/Financial Assistance Referral N/A    Medication N/A    Medical Provider Yes    Screening Referrals Made N/A    Medical Referrals Made N/A    Medical Appointment Completed N/A    CNP Interventions Case Management    Screenings CN Performed N/A    ED Visit Averted N/A    Life-Saving Intervention Made N/A             Client in need of of feminine products. Provided by [employer]. Client here for support.

## 2023-04-25 ENCOUNTER — Encounter: Payer: Self-pay | Admitting: Internal Medicine

## 2023-04-25 ENCOUNTER — Ambulatory Visit: Payer: Self-pay | Admitting: Internal Medicine

## 2023-04-25 VITALS — BP 144/100 | HR 72 | Resp 16 | Ht 58.75 in | Wt 212.0 lb

## 2023-04-25 DIAGNOSIS — I10 Essential (primary) hypertension: Secondary | ICD-10-CM

## 2023-04-25 DIAGNOSIS — R300 Dysuria: Secondary | ICD-10-CM

## 2023-04-25 DIAGNOSIS — E1165 Type 2 diabetes mellitus with hyperglycemia: Secondary | ICD-10-CM

## 2023-04-25 DIAGNOSIS — Z794 Long term (current) use of insulin: Secondary | ICD-10-CM

## 2023-04-25 DIAGNOSIS — K760 Fatty (change of) liver, not elsewhere classified: Secondary | ICD-10-CM

## 2023-04-25 DIAGNOSIS — E039 Hypothyroidism, unspecified: Secondary | ICD-10-CM

## 2023-04-25 DIAGNOSIS — D649 Anemia, unspecified: Secondary | ICD-10-CM

## 2023-04-25 DIAGNOSIS — E785 Hyperlipidemia, unspecified: Secondary | ICD-10-CM

## 2023-04-25 DIAGNOSIS — K029 Dental caries, unspecified: Secondary | ICD-10-CM

## 2023-04-25 LAB — POCT WET PREP WITH KOH
Clue Cells Wet Prep HPF POC: NEGATIVE
KOH Prep POC: NEGATIVE
RBC Wet Prep HPF POC: NEGATIVE
Trichomonas, UA: NEGATIVE
Yeast Wet Prep HPF POC: NEGATIVE

## 2023-04-25 LAB — POCT URINALYSIS DIPSTICK
Bilirubin, UA: NEGATIVE
Blood, UA: NEGATIVE
Glucose, UA: NEGATIVE
Ketones, UA: NEGATIVE
Leukocytes, UA: NEGATIVE
Nitrite, UA: NEGATIVE
Protein, UA: NEGATIVE
Spec Grav, UA: 1.01 (ref 1.010–1.025)
Urobilinogen, UA: 0.2 U/dL
pH, UA: 5 (ref 5.0–8.0)

## 2023-04-25 MED ORDER — GABAPENTIN 100 MG PO CAPS: 100.0000 mg | ORAL_CAPSULE | Freq: Three times a day (TID) | ORAL | 11 refills | Status: AC

## 2023-04-25 MED ORDER — LISINOPRIL 10 MG PO TABS: 10.0000 mg | ORAL_TABLET | Freq: Every day | ORAL | 3 refills | Status: AC

## 2023-04-25 MED ORDER — GLIPIZIDE 10 MG PO TABS: 10.0000 mg | ORAL_TABLET | Freq: Two times a day (BID) | ORAL | 11 refills | Status: AC

## 2023-04-25 MED ORDER — AGAMATRIX ULTRA-THIN LANCETS MISC: 11 refills | Status: AC

## 2023-04-25 MED ORDER — PSYLLIUM 58.6 % PO PACK: 1.0000 | PACK | Freq: Every day | ORAL | Status: AC

## 2023-04-25 MED ORDER — AGAMATRIX PRESTO TEST VI STRP: ORAL_STRIP | 11 refills | Status: AC

## 2023-04-25 MED ORDER — AGAMATRIX PRESTO W/DEVICE KIT
PACK | 0 refills | Status: DC
Start: 2023-04-25 — End: 2023-06-28

## 2023-04-25 MED ORDER — EMPAGLIFLOZIN 10 MG PO TABS: 10.0000 mg | ORAL_TABLET | Freq: Every day | ORAL | 3 refills | Status: AC

## 2023-04-25 MED ORDER — OZEMPIC (0.25 OR 0.5 MG/DOSE) 2 MG/3ML ~~LOC~~ SOPN: PEN_INJECTOR | SUBCUTANEOUS | 11 refills | Status: DC

## 2023-04-25 NOTE — Patient Instructions (Signed)
Call when you have Ozempic so we can go over how to use

## 2023-04-25 NOTE — Progress Notes (Signed)
 Subjective:    Patient ID: [name], female   DOB: 1975-07-13, 48 y.o.   MRN: [id number]   HPI  Here to establish  [name] interprets   Dysuria:  burns when urinates.  Has discomfort in flank area bilaterally.  States has had symptoms for 5 years.  She is using AZO for the discomfort.  Worsening with time.  When drinks water, decreases the discomfort.  Burns both inside and outside.  At times has external itching.  No definite fever, though may have last week.  Feels she has nausea associated--vomited yellow greenish fluid about 4 days ago    2.  DM:  has not had meds for this for at least a year.  Diagnosed in 2003.  A1C was at 7.2% 09/2021, but up to 11.2 % in 04/2022.   + Diabetic neuropathy--took gabapentin in past with improvement.  100 mg 3 times daily.   Poor vision--not clear if she has been told due to diabetic damage.  Five years ago, was told her retinal exam was normal.   She has history of increased protein/crea ratio.   She has GI intolerance to Metformin and XR preparation as well.    3.  Dental decay:  went to an event for teeth cleaning.  She also had 6 dental extractions.  This was not through [facility]  [facility].    4.  Dyslipidemia with low HDL, high trigs and relatively high LDL.    5.  Hypertension:  Took Lisinopril 10 mg in past.    6.  Hx of adenomatous polyp-serrated on 06/2019 with Dr. Adela Lank.  Due in 2028 for repeat.  She also had internal hemorrhoids and states she was constipated.   She does intermittently have constipation and BRBPR.  Does note when constipated, but can note without passing hard stool.  No pain passing stool if not constipated  Stools often small caliber and ribbon like.    7.  HM:  has not had COVID booster this year.   Immunization History  Administered Date(s) Administered   Influenza, Seasonal, Injecte, Preservative Fre 01/17/2023   Influenza,inj,Quad PF,6+ Mos 12/05/2012,  01/30/2014, 02/26/2019, 01/05/2022   Pneumococcal Conjugate-13 06/16/2015   Pneumococcal Polysaccharide-23 09/18/2008   Td 09/18/2008   Tdap 03/06/2019    Current Meds  Medication Sig   Cranberry-Vitamin C-Probiotic (AZO CRANBERRY PO) Take 1 tablet by mouth daily.   ibuprofen (ADVIL,MOTRIN) 200 MG tablet Take 400 mg by mouth every 6 (six) hours as needed.   No Known Allergies  Past Medical History:  Diagnosis Date   Chronic chest pain    Chronic pelvic pain in female    DDD (degenerative disc disease), cervical    Depression    Diabetes mellitus    Dizziness    Fatty liver    GERD (gastroesophageal reflux disease)    H/O echocardiogram 09/2013   normal   Headache    Hypertension    Peripheral neuropathy    Past Surgical History:  Procedure Laterality Date   BREAST BIOPSY Right    2021--fibroadenoma   CESAREAN SECTION     x 3   CHOLECYSTECTOMY N/A 04/10/2015   Procedure: LAPAROSCOPIC CHOLECYSTECTOMY ;  Surgeon: Abigail Miyamoto, MD;  Location: [facility] OR;  Service: General;  Laterality: N/A;   COLONOSCOPY N/A 04/07/2014   NWG:NFAOZ HH/EPIGASTRIC PAIN/SMALL INTERNAL HEMORRHOIDS/MILD DIVERTICULOSIS IN LEFT COLON   ESOPHAGOGASTRODUODENOSCOPY N/A 04/07/2014   HYQ:MVHQI HH/EPIGASTRIC PAIN/SMALL INTERNAL HEMORRHOIDS/MILD DIVERTICULOSIS IN LEFT COLON  ROTATOR CUFF REPAIR Right    TUBAL LIGATION     Family History  Problem Relation Age of Onset   Hypertension Mother    Diabetes Father    Down syndrome Sister        congenital heart disease   Hypertension Sister    Diabetes Sister    Allergies Brother    Depression Daughter    Diabetes Daughter    Pancreatitis Daughter    Migraines Daughter    Asthma Daughter    Anemia Daughter        iron deficiency      Social History   Socioeconomic History   Marital status: Media planner    Spouse name: Francisco   Number of children: 3   Years of education: Not on file   Highest education level: 11th grade   Occupational History   Not on file  Tobacco Use   Smoking status: Never    Passive exposure: Never   Smokeless tobacco: Never  Vaping Use   Vaping status: Never Used  Substance and Sexual Activity   Alcohol use: No   Drug use: No   Sexual activity: Yes    Birth control/protection: Surgical  Other Topics Concern   Not on file  Social History Narrative   Lives at home with husband and 3 children.         Social Drivers of Corporate investment banker Strain: Not on file  Food Insecurity: Food Insecurity Present (03/21/2023)   Hunger Vital Sign    Worried About Running Out of Food in the Last Year: Sometimes true    Ran Out of Food in the Last Year: Never true  Transportation Needs: Unmet Transportation Needs (07/28/2022)   PRAPARE - Administrator, Civil Service (Medical): Yes    Lack of Transportation (Non-Medical): Yes  Physical Activity: Unknown (12/27/2022)   Exercise Vital Sign    Days of Exercise per Week: 2 days    Minutes of Exercise per Session: Not on file  Stress: Not on file  Social Connections: Unknown (07/26/2021)   [facility]   Social Network    Social Network: Not on file  Intimate Partner Violence: Not At Risk (05/06/2022)   Humiliation, Afraid, Rape, and Kick questionnaire    Fear of Current or Ex-Partner: No    Emotionally Abused: No    Physically Abused: No    Sexually Abused: No      Review of Systems    Objective:   BP (!) 144/100 (BP Location: Left Arm, Patient Position: Sitting, Cuff Size: Normal)   Pulse 72   Resp 16   Ht 4' 10.75" (1.492 m)   Wt 212 lb (96.2 kg)   BMI 43.18 kg/m   Physical Exam Obese HEENT:  PERRL, EOMI, TMs pearly gray, throat without injection.  Dental decay and multiple missing teeth. Neck:  Supple, No adenopathy, no thyromegaly Chest:  CTA CV:  RRR with normal S1 and S2, No S3, S4 or murmur.  No carotid bruits.  Carotid, radial and DP pulses normal and equal Abd:  S  + BS, No HSM or mass.  No flank tenderness.  + suprapubic tenderness.   GU:  nor erythema of vulvar area.  Minimal vaginal secretions.  No vaginal or cervical inflammation  LE;  No edema.  Wet prep unremarkable.  UA normal Assessment & Plan   Dysuria:  Send urine for culture with chronic symptoms.  Hold on Abx  for now.  2.  Morbid obesity and DM/microalbuminuria:  Glipizide 10 mg twice daily with meals until can get started on Ozempic 0.25 mg weekly and Jardiance 10 mg daily.  She is to call when she has the Ozempic so can have her come in in 2 weeks with blood glucoses to see if can wean glipizide.  Latter 2 meds through MAP application.   Glucometer and supplies orders sent. Eye referral. A1C, Urine microalbumin/crea TSH  3.  Hypertension:  Lisinopril 10 mg daily.  BP check with BMP in 1 month.  CBC, CMP  4. Anemia:  CBC  5.  Dental decay:  dental referral  6.  Hepatic steatosis and Dyslipidemia:  FLP.    7.  Diabetic peripheral neuropathy:  Gabapentin 100 mg 3 times daily.  8.   Constipation and probable internal hemorrhoids:  Metamucil

## 2023-04-26 LAB — SPECIMEN STATUS

## 2023-04-26 LAB — SPECIMEN STATUS REPORT

## 2023-04-27 LAB — COMPREHENSIVE METABOLIC PANEL
ALT: 19 [IU]/L (ref 0–32)
AST: 21 [IU]/L (ref 0–40)
Albumin: 4.1 g/dL (ref 3.9–4.9)
Alkaline Phosphatase: 148 [IU]/L — ABNORMAL HIGH (ref 44–121)
BUN/Creatinine Ratio: 25 — ABNORMAL HIGH (ref 9–23)
BUN: 13 mg/dL (ref 6–24)
Bilirubin Total: 0.5 mg/dL (ref 0.0–1.2)
CO2: 24 mmol/L (ref 20–29)
Calcium: 9.7 mg/dL (ref 8.7–10.2)
Chloride: 99 mmol/L (ref 96–106)
Creatinine, Ser: 0.52 mg/dL — ABNORMAL LOW (ref 0.57–1.00)
Globulin, Total: 3.1 g/dL (ref 1.5–4.5)
Glucose: 107 mg/dL — ABNORMAL HIGH (ref 70–99)
Potassium: 4.1 mmol/L (ref 3.5–5.2)
Sodium: 140 mmol/L (ref 134–144)
Total Protein: 7.2 g/dL (ref 6.0–8.5)
eGFR: 115 mL/min/{1.73_m2} (ref >59)

## 2023-04-27 LAB — TSH: TSH: 3.57 u[IU]/mL (ref 0.450–4.500)

## 2023-04-27 LAB — HGB A1C W/O EAG: Hgb A1c MFr Bld: 9.9 % — ABNORMAL HIGH (ref 4.8–5.6)

## 2023-04-27 LAB — CBC WITH DIFFERENTIAL/PLATELET
Basophils Absolute: 0.1 10*3/uL (ref 0.0–0.2)
Basos: 1 %
EOS (ABSOLUTE): 0.2 10*3/uL (ref 0.0–0.4)
Eos: 3 %
Hematocrit: 42.5 % (ref 34.0–46.6)
Hemoglobin: 14 g/dL (ref 11.1–15.9)
Immature Grans (Abs): 0 10*3/uL (ref 0.0–0.1)
Immature Granulocytes: 0 %
Lymphocytes Absolute: 2.1 10*3/uL (ref 0.7–3.1)
Lymphs: 30 %
MCH: 27.7 pg (ref 26.6–33.0)
MCHC: 32.9 g/dL (ref 31.5–35.7)
MCV: 84 fL (ref 79–97)
Monocytes Absolute: 0.4 10*3/uL (ref 0.1–0.9)
Monocytes: 6 %
Neutrophils Absolute: 4.2 10*3/uL (ref 1.4–7.0)
Neutrophils: 60 %
Platelets: 236 10*3/uL (ref 150–450)
RBC: 5.06 x10E6/uL (ref 3.77–5.28)
RDW: 13 % (ref 11.7–15.4)
WBC: 6.9 10*3/uL (ref 3.4–10.8)

## 2023-04-27 LAB — MICROALBUMIN / CREATININE URINE RATIO
Creatinine, Urine: 22.6 mg/dL
Microalb/Creat Ratio: 40 mg/g{creat} — ABNORMAL HIGH (ref 0–29)
Microalbumin, Urine: 9.1 ug/mL

## 2023-04-27 LAB — LIPID PANEL W/O CHOL/HDL RATIO
Cholesterol, Total: 177 mg/dL (ref 100–199)
HDL: 58 mg/dL (ref >39)
LDL Chol Calc (NIH): 99 mg/dL (ref 0–99)
Triglycerides: 110 mg/dL (ref 0–149)
VLDL Cholesterol Cal: 20 mg/dL (ref 5–40)

## 2023-04-27 LAB — SPECIMEN STATUS REPORT

## 2023-04-27 LAB — URINE CULTURE

## 2023-04-28 MED ORDER — SULFAMETHOXAZOLE-TRIMETHOPRIM 800-160 MG PO TABS: 1.0000 | ORAL_TABLET | Freq: Two times a day (BID) | ORAL | 0 refills | Status: DC

## 2023-05-23 ENCOUNTER — Other Ambulatory Visit: Payer: Self-pay

## 2023-06-02 ENCOUNTER — Other Ambulatory Visit: Payer: Self-pay

## 2023-06-19 ENCOUNTER — Telehealth: Payer: Self-pay

## 2023-06-19 NOTE — Telephone Encounter (Signed)
 Patient needs an appointment for , patient has been having diarrhea every other day and fever two or three times a week for the past month  Patient has taken advil for fever.    Patient is available from 9am to 2pm   We will call patient if there is a cancellation.

## 2023-06-20 NOTE — Telephone Encounter (Signed)
 Patient has been scheduled

## 2023-06-21 ENCOUNTER — Ambulatory Visit: Payer: Self-pay | Admitting: Internal Medicine

## 2023-06-21 ENCOUNTER — Encounter: Payer: Self-pay | Admitting: Internal Medicine

## 2023-06-21 VITALS — BP 124/78 | HR 69 | Resp 16 | Ht 58.75 in | Wt 211.5 lb

## 2023-06-21 DIAGNOSIS — E785 Hyperlipidemia, unspecified: Secondary | ICD-10-CM

## 2023-06-21 DIAGNOSIS — R300 Dysuria: Secondary | ICD-10-CM

## 2023-06-21 DIAGNOSIS — E1165 Type 2 diabetes mellitus with hyperglycemia: Secondary | ICD-10-CM

## 2023-06-21 DIAGNOSIS — R109 Unspecified abdominal pain: Secondary | ICD-10-CM

## 2023-06-21 DIAGNOSIS — I10 Essential (primary) hypertension: Secondary | ICD-10-CM

## 2023-06-21 DIAGNOSIS — Z794 Long term (current) use of insulin: Secondary | ICD-10-CM

## 2023-06-21 DIAGNOSIS — R197 Diarrhea, unspecified: Secondary | ICD-10-CM

## 2023-06-21 LAB — POCT URINALYSIS DIPSTICK
Bilirubin, UA: NEGATIVE
Blood, UA: NEGATIVE
Glucose, UA: POSITIVE — AB
Ketones, UA: NEGATIVE
Leukocytes, UA: NEGATIVE
Nitrite, UA: NEGATIVE
Protein, UA: NEGATIVE
SPECIFIC GRAVITY: 1.02
Urobilinogen, UA: 0.2 U/dL
pH, UA: 5.5 (ref 5.0–8.0)

## 2023-06-21 NOTE — Progress Notes (Signed)
 Subjective:    Patient ID: [name], female   DOB: [birth year], 48 y.o.   MRN: [id number]   HPI  [name] interprets   Diarrhea:  Started 1 month ago.  Not sure if related to medication, though started some meds just before diarrhea started.  All of her med bottles have different fill dates.  She was treated with Bactrim after her last visit in February--prescribed on the 15th, but did not have money to purchase until sometime after the 21st of Feb.  She did grow E coli from a urine culture.  She does state she developed epigastric and bilateral lower abdominal pain and diarrhea after starting the Bactrim and the glipizide at end of February.  States the diarrhea has not changed since taking the Bactrim, even with addition of other meds, perhaps a week later--this was gabapentin and Ozempic.  Another week later, started Lisinopril and Jardiance.   Has diarrhea 2-3 times weekly and will last 1/2 to all day.  Has 3 to 4 stools daily on the days she has diarrhea.  Lot of yellow water to brown with some solid stool and at times undigested food.  No blood in stool.  Has intervening days with formed stools.   At the beginning of diarrhea, states she had fever of 100.1 orally for the initial 3 weeks of diarrhea symptoms.  One episode of vomiting.   At her last visit, she complained of constipation and internal hemorrhoids and was instructed to use Metamucil.  She holds it on days she is having diarrhea.   Similar history with use of antibiotics in past.   No travel recently. She does not eat yogurt.  2.  Dysuria:  Ultimately grew E coli from urine culture as noted.  Sensitive to Bactrim.  She did not treat this, however, for at least 2 weeks after prescribed.  States her dysuria improved while she was taking Bactrim, but did not completely resolved and returned to pretreatment symptoms after completed 7 day course.    3.  DM/obesity:  sounds like she has used Glipizide and Jardiance  for approximately 5-6 weeks.  She has used 3 weeks of Ozempic starting on 05/23/2023 and last dose for the month is tomorrow.  She has not gotten her paperwork into [facility] for application to proceed.  She brings in sugars that in general are in low to high 100s, but then jumps into low 200s in the morning and evening.  She has not paid attention as to what she did with diet or physical activity prior.   Another look at her sugars shows them to be much lower since the beginning of April.  She is now ranging from 84 to 154 in the morning and 92 to 180 in the evening. Has lost 1 lb since initiation of Ozempic Has not yet heard about an eye or dental appt through [facility].    4.  Hypertension:  sounds like she has taken 5 weeks or less.    Current Meds  Medication Sig   AgaMatrix Ultra-Thin Lancets MISC Check blood sugar before meals twice daily   Blood Glucose Monitoring Suppl (AGAMATRIX PRESTO) w/Device KIT Check blood glucose before meals twice daily   Cranberry-Vitamin C-Probiotic (AZO CRANBERRY PO) Take 1 tablet by mouth daily.   empagliflozin (JARDIANCE) 10 MG TABS tablet Take 1 tablet (10 mg total) by mouth daily before breakfast.   gabapentin (NEURONTIN) 100 MG capsule Take 1 capsule (100 mg total) by mouth 3 (three)  times daily.   glipiZIDE (GLUCOTROL) 10 MG tablet Take 1 tablet (10 mg total) by mouth 2 (two) times daily before a meal.   glucose blood (AGAMATRIX PRESTO TEST) test strip Check blood sugar twice daily before meals   ibuprofen (ADVIL,MOTRIN) 200 MG tablet Take 400 mg by mouth every 6 (six) hours as needed.   lisinopril (ZESTRIL) 10 MG tablet Take 1 tablet (10 mg total) by mouth daily.   psyllium (METAMUCIL) 58.6 % packet Take 1 packet by mouth daily. In 8 oz water   Semaglutide,0.25 or 0.5MG /DOS, (OZEMPIC, 0.25 OR 0.5 MG/DOSE,) 2 MG/3ML SOPN O.25 mg injected subcutaneously once weekly.   No Known Allergies   Review of Systems    Objective:   BP 124/78 (BP Location: Left Arm,  Patient Position: Sitting, Cuff Size: Normal)   Pulse 69   Resp 16   Ht 4' 10.75" (1.492 m)   Wt 211 lb 8 oz (95.9 kg)   SpO2 99%   BMI 43.08 kg/m   Physical Exam NAD Neck:  Supple, No adenopathy Chest:  CTA CV:  RRR without murmur or rub.  Radial and DP pulses normal and equal Abd:  No flank tenderness.  Tender in RUQ without rebound or peritoneal signs.  No HSM or mass,     Assessment & Plan   Intermittent diarrhea and abdominal pain in a non-toxic appearing patient:  Very difficult history as such a staggered start with medications.  Sounds like symptoms first developed with taking Bactrim, however.  She does not have a gallbladder.  CBC, CMP, see urine w/u below.  Given vials to collect stool for O & P and for C diff antigen.  To eat [place] yogurt twice daily  2.  Dysuria:  recheck UA and urine culture.  Again, non-toxic appearing.  3.  Hypertension:  controlled on Lisinopril  4.  Dyslipidemia:  will recheck lipids with next A1C.  5.  DM/obesity:  She is to go to [facility] pharm tomorrow and pick up more Ozempic samples and bring letters from pharm for Ozempic and her Rx for Jardiance.  If she is able to continue the Ozempic, will wean her glipizide to 5 mg twice daily.

## 2023-06-22 LAB — COMPREHENSIVE METABOLIC PANEL WITH GFR
ALT: 26 IU/L (ref 0–32)
AST: 26 IU/L (ref 0–40)
Albumin: 4.3 g/dL (ref 3.9–4.9)
Alkaline Phosphatase: 137 IU/L — ABNORMAL HIGH (ref 44–121)
BUN/Creatinine Ratio: 20 (ref 9–23)
BUN: 10 mg/dL (ref 6–24)
Bilirubin Total: 0.5 mg/dL (ref 0.0–1.2)
CO2: 23 mmol/L (ref 20–29)
Calcium: 9.1 mg/dL (ref 8.7–10.2)
Chloride: 105 mmol/L (ref 96–106)
Creatinine, Ser: 0.5 mg/dL — ABNORMAL LOW (ref 0.57–1.00)
Globulin, Total: 3.1 g/dL (ref 1.5–4.5)
Glucose: 92 mg/dL (ref 70–99)
Potassium: 3.9 mmol/L (ref 3.5–5.2)
Sodium: 144 mmol/L (ref 134–144)
Total Protein: 7.4 g/dL (ref 6.0–8.5)
eGFR: 116 mL/min/{1.73_m2} (ref >59)

## 2023-06-22 LAB — CBC WITH DIFFERENTIAL/PLATELET
Basophils Absolute: 0.1 10*3/uL (ref 0.0–0.2)
Basos: 1 %
EOS (ABSOLUTE): 0.2 10*3/uL (ref 0.0–0.4)
Eos: 2 %
Hematocrit: 45.6 % (ref 34.0–46.6)
Hemoglobin: 14.8 g/dL (ref 11.1–15.9)
Immature Grans (Abs): 0 10*3/uL (ref 0.0–0.1)
Immature Granulocytes: 0 %
Lymphocytes Absolute: 1.8 10*3/uL (ref 0.7–3.1)
Lymphs: 24 %
MCH: 27.2 pg (ref 26.6–33.0)
MCHC: 32.5 g/dL (ref 31.5–35.7)
MCV: 84 fL (ref 79–97)
Monocytes Absolute: 0.4 10*3/uL (ref 0.1–0.9)
Monocytes: 5 %
Neutrophils Absolute: 5.2 10*3/uL (ref 1.4–7.0)
Neutrophils: 68 %
Platelets: 238 10*3/uL (ref 150–450)
RBC: 5.45 x10E6/uL — ABNORMAL HIGH (ref 3.77–5.28)
RDW: 13.1 % (ref 11.7–15.4)
WBC: 7.6 10*3/uL (ref 3.4–10.8)

## 2023-06-23 ENCOUNTER — Other Ambulatory Visit: Payer: Self-pay

## 2023-06-23 LAB — URINE CULTURE

## 2023-06-27 ENCOUNTER — Other Ambulatory Visit: Payer: Self-pay

## 2023-06-27 DIAGNOSIS — R921 Mammographic calcification found on diagnostic imaging of breast: Secondary | ICD-10-CM

## 2023-06-28 ENCOUNTER — Other Ambulatory Visit: Payer: Self-pay

## 2023-06-28 MED ORDER — AGAMATRIX PRESTO W/DEVICE KIT: PACK | 0 refills | Status: AC

## 2023-07-05 ENCOUNTER — Encounter: Payer: Self-pay | Admitting: Internal Medicine

## 2023-07-25 ENCOUNTER — Other Ambulatory Visit: Payer: Self-pay

## 2023-07-25 DIAGNOSIS — E1165 Type 2 diabetes mellitus with hyperglycemia: Secondary | ICD-10-CM

## 2023-07-26 LAB — HEMOGLOBIN A1C
Est. average glucose Bld gHb Est-mCnc: 186 mg/dL
Hgb A1c MFr Bld: 8.1 % — ABNORMAL HIGH (ref 4.8–5.6)

## 2023-08-01 ENCOUNTER — Ambulatory Visit: Payer: Self-pay | Admitting: Internal Medicine

## 2023-08-01 ENCOUNTER — Telehealth: Payer: Self-pay

## 2023-08-01 NOTE — Telephone Encounter (Signed)
 Patient would like an appointment for cough , fever, bone ache and sore throat.  Symptoms started 2 weeks ago . Symptoms stopped and  started again Sunday 07/30/23  Patient is also having difficulty breathing. Patient notices this when talking or when walking.  Patient has taken otc medication named XL-3.  We will call patient if there is a cancellation

## 2023-08-01 NOTE — Telephone Encounter (Signed)
 Per Dr. Jayne Mews called patient and notify her that we do not have any appointments available for the day and patient needs to be seen today as she is having difficulty breathing. Patient was asked to go to a [facility].   Patient was also asked to call us  back tomorrow to let us  know if she did go to UC or if she did not go. Patient agreed.

## 2023-08-11 ENCOUNTER — Ambulatory Visit: Payer: Self-pay | Admitting: Internal Medicine

## 2023-08-18 ENCOUNTER — Other Ambulatory Visit: Payer: Self-pay

## 2023-08-18 ENCOUNTER — Telehealth: Payer: Self-pay | Admitting: Internal Medicine

## 2023-08-21 ENCOUNTER — Other Ambulatory Visit: Payer: Self-pay

## 2023-08-23 ENCOUNTER — Other Ambulatory Visit: Payer: Self-pay

## 2023-08-25 NOTE — Progress Notes (Addendum)
 Patient is here for weight check   reports taking 3 dm medications without any problems Ozempic  0.25 mg for two months now

## 2023-08-29 ENCOUNTER — Encounter: Payer: Self-pay | Admitting: Internal Medicine

## 2023-08-29 ENCOUNTER — Ambulatory Visit: Payer: Self-pay | Admitting: Internal Medicine

## 2023-08-29 VITALS — BP 124/82 | HR 77 | Resp 16 | Ht 58.75 in | Wt 211.5 lb

## 2023-08-29 DIAGNOSIS — R300 Dysuria: Secondary | ICD-10-CM

## 2023-08-29 DIAGNOSIS — Z794 Long term (current) use of insulin: Secondary | ICD-10-CM

## 2023-08-29 DIAGNOSIS — R197 Diarrhea, unspecified: Secondary | ICD-10-CM

## 2023-08-29 DIAGNOSIS — E1165 Type 2 diabetes mellitus with hyperglycemia: Secondary | ICD-10-CM

## 2023-08-29 LAB — POCT URINALYSIS DIPSTICK
Bilirubin, UA: NEGATIVE
Blood, UA: NEGATIVE
Glucose, UA: NEGATIVE
Ketones, UA: NEGATIVE
Leukocytes, UA: NEGATIVE
Nitrite, UA: NEGATIVE
Protein, UA: NEGATIVE
Spec Grav, UA: 1.02 (ref 1.010–1.025)
Urobilinogen, UA: 0.2 U/dL
pH, UA: 5.5 (ref 5.0–8.0)

## 2023-08-29 NOTE — Progress Notes (Signed)
 Subjective:    Patient ID: [name], female   DOB: 1975-08-06, 48 y.o.   MRN: [id number]   HPI  [name] interprets   Diarrhea:  Did not bring stool sample in for infectious evaluation, including C diff ordered 2 months ago.  States she did not feel well, so did not bring in until now.   She continues to have intermittent diarrhea of up to 3 to 4 times weekly and 3-4 stools per day.  States she is now wearing a diaper because of this.   She also has intermittent nausea and can vomit--last episode of latter was a week ago.  Vomitis once to twice weekly Please see visit from 06/21/2023.  She started new meds for DM over a period of time, was on antibiotics for UTIs and was not clear if her symptoms preceded starting meds or after.   She may be having fevers at times Has not been having in past month.   She did have a respiratory illness about 1 month ago and sounds like diarrhea was worse then as well.   She never went to ED as recommended--actually went and when she found out the wait was long, so went back home.  She gradually improved at home.  Drank herbal teas.   She also continues to have burning on urination, but that is intermittent. TSH normal in February   2.  DM:  her A1C was 8.1% mid May, down from 9.9% in February.  Her weight has not changed.  We have not increased to 0.5 mg weekly with Ozempic  as needed to evaluate where she is with diarrhea.  She did not tolerate Metformin  in past due to abdominal discomfort and diarrhea.  Last treated in about 2018.    She is due for her next dose of Ozempic  this Thursday. She is keeping track of sugars, but did not bring in her sheets today.   Glucoses in the morning running around 160.  In the evening, running 180.   She states she has had low blood sugars--5 am 2 days ago, awakened not feeling well and sugar was 40. She admits there are times she does not eat and likely what occurred before her sugar dropped.       Current Meds  Medication Sig   AgaMatrix Ultra-Thin Lancets MISC Check blood sugar before meals twice daily   Blood Glucose Monitoring Suppl (AGAMATRIX PRESTO) w/Device KIT Check blood glucose before meals twice daily   Cranberry-Vitamin C-Probiotic (AZO CRANBERRY PO) Take 1 tablet by mouth daily.   empagliflozin  (JARDIANCE ) 10 MG TABS tablet Take 1 tablet (10 mg total) by mouth daily before breakfast.   gabapentin  (NEURONTIN ) 100 MG capsule Take 1 capsule (100 mg total) by mouth 3 (three) times daily.   glipiZIDE  (GLUCOTROL ) 10 MG tablet Take 1 tablet (10 mg total) by mouth 2 (two) times daily before a meal.   glucose blood (AGAMATRIX PRESTO TEST) test strip Check blood sugar twice daily before meals   ibuprofen  (ADVIL ,MOTRIN ) 200 MG tablet Take 400 mg by mouth every 6 (six) hours as needed.   lisinopril  (ZESTRIL ) 10 MG tablet Take 1 tablet (10 mg total) by mouth daily.   Semaglutide ,0.25 or 0.5MG /DOS, (OZEMPIC , 0.25 OR 0.5 MG/DOSE,) 2 MG/3ML SOPN O.25 mg injected subcutaneously once weekly.   No Known Allergies   Review of Systems    Objective:   BP 124/82 (BP Location: Left Arm, Patient Position: Sitting, Cuff Size: Normal)   Pulse 77  Resp 16   Ht 4' 10.75 (1.492 m)   Wt 211 lb 8 oz (95.9 kg)   SpO2 97%   BMI 43.08 kg/m   Physical Exam NAD Neck:  Supple, No adenopathy, no thyromegaly Lungs:  CTA CV:  RRR without murmur or rub.   Abd:  S, + BS, Mild, diffuse tenderness,   No rebound or peritoneal signs.  No HSM or mass,    Assessment & Plan   Diarrhea and intermittent nausea and vomiting:  Previous history suggested her start of Ozempic  did not coincide with onset up diarrhea, though she has difficulty being clear with history.   Despite her history of prolonged diarrhea, she has not lost weight. Check C diff and O & P.  Hold  ozempic  for now.    2.  DM:  Limited treatment as also does not tolerate Metformin .  Continue Glipizide  for now.  Weigh in with sugar  journal in 2 weeks.  A1C in 3 months and follow up with me thereafter.  3.  Dysuria:  UA normal

## 2023-08-30 ENCOUNTER — Ambulatory Visit: Payer: Self-pay | Admitting: Internal Medicine

## 2023-08-30 DIAGNOSIS — R197 Diarrhea, unspecified: Secondary | ICD-10-CM | POA: Insufficient documentation

## 2023-08-30 LAB — CBC WITH DIFFERENTIAL/PLATELET
Basophils Absolute: 0 10*3/uL (ref 0.0–0.2)
Basos: 1 %
EOS (ABSOLUTE): 0.2 10*3/uL (ref 0.0–0.4)
Eos: 2 %
Hematocrit: 43.3 % (ref 34.0–46.6)
Hemoglobin: 13.8 g/dL (ref 11.1–15.9)
Immature Grans (Abs): 0 10*3/uL (ref 0.0–0.1)
Immature Granulocytes: 0 %
Lymphocytes Absolute: 1.4 10*3/uL (ref 0.7–3.1)
Lymphs: 20 %
MCH: 27.3 pg (ref 26.6–33.0)
MCHC: 31.9 g/dL (ref 31.5–35.7)
MCV: 86 fL (ref 79–97)
Monocytes Absolute: 0.4 10*3/uL (ref 0.1–0.9)
Monocytes: 6 %
Neutrophils Absolute: 4.9 10*3/uL (ref 1.4–7.0)
Neutrophils: 71 %
Platelets: 201 10*3/uL (ref 150–450)
RBC: 5.05 x10E6/uL (ref 3.77–5.28)
RDW: 13.6 % (ref 11.7–15.4)
WBC: 6.9 10*3/uL (ref 3.4–10.8)

## 2023-08-31 LAB — CLOSTRIDIUM DIFFICILE BY PCR: Toxigenic C. Difficile by PCR: NEGATIVE

## 2023-09-04 ENCOUNTER — Ambulatory Visit: Payer: Self-pay | Admitting: Internal Medicine

## 2023-09-07 ENCOUNTER — Ambulatory Visit
Admission: RE | Admit: 2023-09-07 | Discharge: 2023-09-07 | Disposition: A | Discharge status: Home / Self Care | Payer: Self-pay | Source: Ambulatory Visit | Attending: Obstetrics and Gynecology | Admitting: Obstetrics and Gynecology

## 2023-09-07 ENCOUNTER — Ambulatory Visit: Payer: Self-pay | Admitting: *Deleted

## 2023-09-07 VITALS — BP 130/82 | Wt 214.8 lb

## 2023-09-07 DIAGNOSIS — N644 Mastodynia: Secondary | ICD-10-CM

## 2023-09-07 DIAGNOSIS — M79629 Pain in unspecified upper arm: Secondary | ICD-10-CM

## 2023-09-07 DIAGNOSIS — R921 Mammographic calcification found on diagnostic imaging of breast: Secondary | ICD-10-CM

## 2023-09-07 DIAGNOSIS — Z1239 Encounter for other screening for malignant neoplasm of breast: Secondary | ICD-10-CM

## 2023-09-07 DIAGNOSIS — Z1211 Encounter for screening for malignant neoplasm of colon: Secondary | ICD-10-CM

## 2023-09-07 LAB — OVA AND PARASITE EXAMINATION

## 2023-09-07 NOTE — Progress Notes (Signed)
 [name] is a 48 y.o. female who presents to [facility] clinic today with complaint of bilateral outer breast and axillary pain x 3-4 months. Patient stated the pain comes and goes. Patient rated the pain at a 8-9 out of 10.   Pap Smear: Pap smear not completed today. Last Pap smear was 04/16/2019 at [facility] clinic and was normal with negative HPV. Per patient has no history of an abnormal Pap smear. Last Pap smear result is available in Epic.    Physical exam: Breasts Breasts symmetrical. No skin abnormalities bilateral breasts. No nipple retraction bilateral breasts. No nipple discharge bilateral breasts. No lymphadenopathy. No lumps palpated bilateral breasts. Complaints of bilateral outer breast tenderness on exam.     MS DIGITAL DIAG TOMO BILAT Result Date: 08/01/2022 CLINICAL DATA:  Six-month follow-up of right breast calcifications. EXAM: DIGITAL DIAGNOSTIC BILATERAL MAMMOGRAM WITH TOMOSYNTHESIS TECHNIQUE: Bilateral digital diagnostic mammography and breast tomosynthesis was performed. COMPARISON:  Previous exam(s). ACR Breast Density Category b: There are scattered areas of fibroglandular density. FINDINGS: The round and punctate calcifications in the lateral central right breast at a mid depth are stable. No new or suspicious findings in either breast. IMPRESSION: Stable probably benign right breast calcifications. RECOMMENDATION: Recommend 12 month follow-up mammography of the right breast calcifications. The patient will be due for bilateral mammography at that time. I have discussed the findings and recommendations with the patient. If applicable, a reminder letter will be sent to the patient regarding the next appointment. BI-RADS CATEGORY  3: Probably benign. Electronically Signed   By: Alm Pouch III M.D.   On: 08/01/2022 11:28  MS DIGITAL DIAG TOMO UNI RIGHT Result Date: 01/31/2022 CLINICAL DATA:  First six-month follow-up for probably benign calcifications in the RIGHT  breast. EXAM: DIGITAL DIAGNOSTIC UNILATERAL RIGHT MAMMOGRAM WITH TOMOSYNTHESIS TECHNIQUE: Right digital diagnostic mammography and breast tomosynthesis was performed. COMPARISON:  07/30/2021 and earlier ACR Breast Density Category b: There are scattered areas of fibroglandular density. FINDINGS: Magnified views are performed of calcifications in the LOWER OUTER QUADRANT of the RIGHT breast. These views demonstrate loosely grouped round calcifications without suspicious morphology or distribution. No suspicious mass, distortion, or microcalcifications are identified to suggest presence of malignancy. IMPRESSION: Stable, probably benign RIGHT breast calcifications. RECOMMENDATION: Bilateral diagnostic mammogram in 6 months. I have discussed the findings and recommendations with the patient with the assistance of an interpreter. If applicable, a reminder letter will be sent to the patient regarding the next appointment. BI-RADS CATEGORY  3: Probably benign. Electronically Signed   By: Almarie Daring M.D.   On: 01/31/2022 08:59  MS DIGITAL DIAG TOMO BILAT Result Date: 07/30/2021 CLINICAL DATA:  48 year old female recalled from screening mammogram dated 07/08/2021 for right breast calcifications and possible left breast asymmetry. EXAM: DIGITAL DIAGNOSTIC BILATERAL MAMMOGRAM WITH TOMOSYNTHESIS AND CAD TECHNIQUE: Bilateral digital diagnostic mammography and breast tomosynthesis was performed. The images were evaluated with computer-aided detection. COMPARISON:  Previous exam(s). ACR Breast Density Category b: There are scattered areas of fibroglandular density. FINDINGS: Loosely grouped punctate calcifications in the central lateral right breast at mid depth have a probably benign morphology. Possible asymmetry in the slightly lateral left breast at mid depth on the CC projection resolves into well dispersed fibroglandular tissue on additional views. IMPRESSION: 1. Probably benign right breast calcifications.  Recommend short-term follow-up. 2. No mammographic evidence of malignancy on the left. RECOMMENDATION: Diagnostic right breast mammogram in 6 months. I have discussed the findings and recommendations with the patient. If applicable, a reminder letter  will be sent to the patient regarding the next appointment. BI-RADS CATEGORY  3: Probably benign. Electronically Signed   By: Serena  Chacko M.D.   On: 07/30/2021 10:37  MS DIGITAL SCREENING TOMO BILATERAL Result Date: 07/09/2021 CLINICAL DATA:  Screening. EXAM: DIGITAL SCREENING BILATERAL MAMMOGRAM WITH TOMOSYNTHESIS AND CAD TECHNIQUE: Bilateral screening digital craniocaudal and mediolateral oblique mammograms were obtained. Bilateral screening digital breast tomosynthesis was performed. The images were evaluated with computer-aided detection. COMPARISON:  Previous exam(s). ACR Breast Density Category b: There are scattered areas of fibroglandular density. FINDINGS: In the right breast calcifications requires further evaluation. In the left breast asymmetry requires further evaluation. IMPRESSION: Further evaluation is suggested for possible calcifications in the right breast. Further evaluation is suggested for possible asymmetry in the left breast. RECOMMENDATION: Diagnostic mammogram and possibly ultrasound of both breasts. (Code:FI-B-37M) The patient will be contacted regarding the findings, and additional imaging will be scheduled. BI-RADS CATEGORY  0: Incomplete. Need additional imaging evaluation and/or prior mammograms for comparison. Electronically Signed   By: Bard Moats M.D.   On: 07/09/2021 14:29   MS DIGITAL SCREENING TOMO BILATERAL Result Date: 07/01/2020 CLINICAL DATA:  Screening. EXAM: DIGITAL SCREENING BILATERAL MAMMOGRAM WITH TOMOSYNTHESIS AND CAD TECHNIQUE: Bilateral screening digital craniocaudal and mediolateral oblique mammograms were obtained. Bilateral screening digital breast tomosynthesis was performed. The images were evaluated with  computer-aided detection. COMPARISON:  Previous exam(s). ACR Breast Density Category b: There are scattered areas of fibroglandular density. FINDINGS: There are no findings suspicious for malignancy. The images were evaluated with computer-aided detection. IMPRESSION: No mammographic evidence of malignancy. A result letter of this screening mammogram will be mailed directly to the patient. RECOMMENDATION: Screening mammogram in one year. (Code:[id number]-01Y) BI-RADS CATEGORY  1: Negative. Electronically Signed   By: Alm Parkins M.D.   On: 07/01/2020 08:06   MM CLIP PLACEMENT RIGHT Result Date: 07/09/2019 CLINICAL DATA:  Post ultrasound-guided biopsy of a mass in the right breast at 12 o'clock retroareolar. EXAM: DIAGNOSTIC RIGHT MAMMOGRAM POST ULTRASOUND BIOPSY COMPARISON:  Previous exams. FINDINGS: Mammographic images were obtained following ultrasound guided biopsy of a mass in the right breast at 12 o'clock retroareolar. A ribbon shaped biopsy marking clip is present at the site of the biopsied mass in the right breast at 12 o'clock retroareolar. IMPRESSION: Ribbon shaped biopsy marking clip at site of biopsied mass in the right breast at 12 o'clock retroareolar. Final Assessment: Post Procedure Mammograms for Marker Placement Electronically Signed   By: Delon Music M.D.   On: 07/09/2019 14:44   MS DIGITAL DIAG TOMO BILAT Addendum Date: 05/27/2019 ADDENDUM REPORT: 05/24/2019 12:29 ADDENDUM: Surgical consultation for evaluation of indeterminate LEFT breast clear nipple discharge has been arranged with Dr. Georgette Poli at [facility] on May 31, 2019. Hendricks Benders, RN on 05/24/2019. Electronically Signed   By: Bard Moats M.D.   On: 05/24/2019 12:29   Result Date: 05/27/2019 CLINICAL DATA:  Patient presents for evaluation of spontaneous clear left nipple discharge. EXAM: DIGITAL DIAGNOSTIC BILATERAL MAMMOGRAM WITH CAD AND TOMO ULTRASOUND LEFT BREAST COMPARISON:  None ACR Breast Density  Category a: The breast tissue is almost entirely fatty. FINDINGS: No concerning masses, calcifications or distortion identified within the left or right breast. Mammographic images were processed with CAD. The patient is able to elicit a small amount of clear discharge from the left nipple with palpation. Targeted ultrasound is performed, showing no dilated duct or retroareolar mass within the left breast. There is a small 5 x 2 x 6  mm cyst left breast 10 o'clock position 2 cm from nipple. IMPRESSION: 1. Indeterminate clear spontaneous left nipple discharge. RECOMMENDATION: Bilateral breast MRI and surgical consultation for further evaluation of indeterminate left breast clear nipple discharge. I have discussed the findings and recommendations with the patient. If applicable, a reminder letter will be sent to the patient regarding the next appointment. BI-RADS CATEGORY  2: Benign. Electronically Signed: By: Bard Moats M.D. On: 05/22/2019 14:10    Pelvic/Bimanual Pap is not indicated today per BCCCP guidelines.    Smoking History: Patient has never smoked.   Patient Navigation: Patient education provided. Access to services provided for patient through [facility] program. Spanish interpreter Bernice Angry from [facility] provided.   Colorectal Cancer Screening: Per patient has had colonoscopy completed on 07/08/2019 at [facility]. Patient stated she had a precancerous polyp removed and a repeat colonoscopy is due in 7 years. FIT Test given to patient to complete. No complaints today.    Breast and Cervical Cancer Risk Assessment: Patient does not have family history of breast cancer, known genetic mutations, or radiation treatment to the chest before age 69. Patient does not have history of cervical dysplasia, immunocompromised, or DES exposure in-utero.  Risk Scores as of Encounter on 09/07/2023     Alisa as of 07/28/2022           5-year 1.25%   Lifetime 11.25%   This patient is [place] but has  no documented birth country, so the [place] model used data from [place] patients to calculate their risk score. Document a birth country in the Demographics activity for a more accurate score.         Last calculated by Silas, Ansyi K, CMA on 07/28/2022 at 11:00 AM        A: BCCCP exam without pap smear Complaint of bilateral outer breast pain.  P: Referred patient to the [facility] for a diagnostic mammogram. Appointment scheduled Thursday, September 07, 2023 at 1240.  Driscilla Wanda SQUIBB, RN 09/07/2023 11:21 AM

## 2023-09-07 NOTE — Patient Instructions (Signed)
 Explained breast self awareness with [employer]. Patient did not need a Pap smear today due to last Pap smear and HPV Typing was 04/16/2019. Let her know BCCCP will cover Pap smears and HPV typing every 5 years unless has a history of abnormal Pap smears. Referred patient to the [facility] for a diagnostic mammogram. Appointment scheduled Thursday, September 07, 2023 at 1240. Patient aware of appointment and will be there. [name] verbalized understanding.  Mariella Blackwelder, Wanda Ship, RN 11:21 AM

## 2023-09-08 ENCOUNTER — Other Ambulatory Visit: Payer: Self-pay

## 2023-09-12 ENCOUNTER — Other Ambulatory Visit: Payer: Self-pay

## 2023-09-14 ENCOUNTER — Other Ambulatory Visit: Payer: Self-pay

## 2023-09-14 ENCOUNTER — Telehealth: Payer: Self-pay | Admitting: Internal Medicine

## 2023-09-14 NOTE — Telephone Encounter (Signed)
 Called patient and left a voicemail to return my call to ask/inform of the following:  To inform patient the Fecal results were negative To ask patient if her Diarrhea has been resolved and to make patient aware that Insulin  treatment might be consider since she also does not tolerate Metformin .

## 2023-09-14 NOTE — Progress Notes (Unsigned)
 Patient came in for weight check and sugar logs.

## 2023-09-18 NOTE — Progress Notes (Unsigned)
 Patient was to come in and let us  know if diarrhea resolved with discontinuation of Ozempic  on 08/29/23. Will need to call and find out. Glucoses remain mainly in low 200s, but not tolerating many meds for DM.

## 2023-09-25 ENCOUNTER — Telehealth: Payer: Self-pay | Admitting: Internal Medicine

## 2023-09-25 NOTE — Telephone Encounter (Signed)
 Patient did not have menstrual cycle for past 2 years. And patient started a menstrual cycle on Saturday morning describes period is heavy with a lot of blood clots and feels blood is hot when comes out.  As of today Monday period continues heavy patient states she notices blood is a shiny red color  Patient states she is feeling some cramping and back pain.   Patient would like to be seen to get check.

## 2023-09-27 NOTE — Telephone Encounter (Signed)
 Needs appt--acute or wait list

## 2023-10-03 NOTE — Telephone Encounter (Signed)
 Called patient to offer appointment, patient states she no longer needs appointment for the moment , patient states she would like to wait for upcoming appointment in September.

## 2023-12-01 ENCOUNTER — Other Ambulatory Visit: Payer: Self-pay

## 2023-12-01 DIAGNOSIS — E1165 Type 2 diabetes mellitus with hyperglycemia: Secondary | ICD-10-CM

## 2023-12-01 DIAGNOSIS — Z794 Long term (current) use of insulin: Secondary | ICD-10-CM

## 2023-12-04 LAB — HEMOGLOBIN A1C
Est. average glucose Bld gHb Est-mCnc: 260 mg/dL
Hgb A1c MFr Bld: 10.7 % — ABNORMAL HIGH (ref 4.8–5.6)

## 2023-12-06 ENCOUNTER — Encounter: Payer: Self-pay | Admitting: Internal Medicine

## 2023-12-06 ENCOUNTER — Ambulatory Visit: Payer: Self-pay | Admitting: Internal Medicine

## 2023-12-06 DIAGNOSIS — E1165 Type 2 diabetes mellitus with hyperglycemia: Secondary | ICD-10-CM

## 2023-12-06 DIAGNOSIS — Z5948 Other specified lack of adequate food: Secondary | ICD-10-CM

## 2023-12-06 DIAGNOSIS — R300 Dysuria: Secondary | ICD-10-CM

## 2023-12-06 DIAGNOSIS — I10 Essential (primary) hypertension: Secondary | ICD-10-CM

## 2023-12-06 DIAGNOSIS — Z794 Long term (current) use of insulin: Secondary | ICD-10-CM

## 2023-12-06 DIAGNOSIS — N898 Other specified noninflammatory disorders of vagina: Secondary | ICD-10-CM

## 2023-12-06 LAB — POCT URINALYSIS DIPSTICK
Bilirubin, UA: NEGATIVE
Blood, UA: NEGATIVE
Glucose, UA: POSITIVE — AB
Ketones, UA: NEGATIVE
Leukocytes, UA: NEGATIVE
Nitrite, UA: POSITIVE
Protein, UA: NEGATIVE
Spec Grav, UA: 1.015 (ref 1.010–1.025)
Urobilinogen, UA: 0.2 U/dL
pH, UA: 6.5 (ref 5.0–8.0)

## 2023-12-06 LAB — POCT WET PREP WITH KOH
KOH Prep POC: NEGATIVE
RBC Wet Prep HPF POC: NEGATIVE
Trichomonas, UA: NEGATIVE
Yeast Wet Prep HPF POC: NEGATIVE

## 2023-12-06 MED ORDER — SITAGLIPTIN PHOSPHATE 50 MG PO TABS: 50.0000 mg | ORAL_TABLET | Freq: Every day | ORAL | 11 refills | Status: AC

## 2023-12-06 NOTE — Progress Notes (Signed)
 Subjective:    Patient ID: [name], female   DOB: 1975-06-01, 48 y.o.   MRN: [id number]   HPI  Erminio Bloomer interprets   DM:  She states she is taking Jardiance  and Glipizide  regularly.  She stopped the Ozempic  in June as she feels it caused her diarrhea--states diarrhea stopped about 1 week after stopping Ozempic .  A1C now back up to 10.7%.  Had been down to 8.1% with Ozempic  previously.  She is okay with starting Januvia  and in future insulin , if needed.    Diet Morning:  Cactus smoothie sometimes with celery and sometimes another red veggie we don't know the english name for .  Lunch:  Beans, and rice but no tortilla.  White rice.  Difficulties with food access.  2.  Menstrual irregularities:  Had period twice last month and this month only spotting one day this month.  No hot flashes or night sweats.    3.  Urinary frequency and large urine volume.  Also now with urine odor.  Unable to control urination for past 1.5 months.  She has been drinking cranberry juice for this.  Having white vaginal discharge.    Current Meds  Medication Sig   AgaMatrix Ultra-Thin Lancets MISC Check blood sugar before meals twice daily   Blood Glucose Monitoring Suppl (AGAMATRIX PRESTO) w/Device KIT Check blood glucose before meals twice daily   Cranberry-Vitamin C-Probiotic (AZO CRANBERRY PO) Take 1 tablet by mouth daily.   empagliflozin  (JARDIANCE ) 10 MG TABS tablet Take 1 tablet (10 mg total) by mouth daily before breakfast.   gabapentin  (NEURONTIN ) 100 MG capsule Take 1 capsule (100 mg total) by mouth 3 (three) times daily.   glipiZIDE  (GLUCOTROL ) 10 MG tablet Take 1 tablet (10 mg total) by mouth 2 (two) times daily before a meal.   glucose blood (AGAMATRIX PRESTO TEST) test strip Check blood sugar twice daily before meals   ibuprofen  (ADVIL ,MOTRIN ) 200 MG tablet Take 400 mg by mouth every 6 (six) hours as needed.   lisinopril  (ZESTRIL ) 10 MG tablet Take 1 tablet (10 mg total) by  mouth daily.   No Known Allergies   Review of Systems    Objective:   BP 122/76 (Patient Position: Sitting, Cuff Size: Normal)   Pulse 83   Resp 18   Ht 4' 11 (1.499 m)   Wt 210 lb (95.3 kg)   LMP 05/22/2020   BMI 42.41 kg/m   Physical Exam Constitutional:      Appearance: She is obese.  HENT:     Head: Normocephalic and atraumatic.     Right Ear: Tympanic membrane normal.     Left Ear: Tympanic membrane normal.     Mouth/Throat:     Mouth: Mucous membranes are moist.     Pharynx: Oropharynx is clear.  Eyes:     Extraocular Movements: Extraocular movements intact.     Conjunctiva/sclera: Conjunctivae normal.     Pupils: Pupils are equal, round, and reactive to light.  Cardiovascular:     Rate and Rhythm: Normal rate and regular rhythm.     Pulses: Normal pulses.     Heart sounds: Normal heart sounds. No murmur heard.    No friction rub.  Pulmonary:     Effort: Pulmonary effort is normal.     Breath sounds: Normal breath sounds.  Abdominal:     General: Bowel sounds are normal.     Palpations: Abdomen is soft. There is no hepatomegaly, splenomegaly or mass.  Tenderness: There is no abdominal tenderness.     Comments: No suprapubic or flank tenderness.    Genitourinary:    General: Normal vulva.     Comments: Scant white vaginal discharge with no underlying mucosal inflammation or odor. No CMT Musculoskeletal:     Cervical back: Normal range of motion and neck supple.  Lymphadenopathy:     Cervical: No cervical adenopathy.  Skin:    General: Skin is warm.     Findings: No rash.  Neurological:     Mental Status: She is alert.      Assessment & Plan   DM, obesity and food insecurity:  Add Januvia  50 mg once daily as did not tolerate Ozempic .  Referral to [facility] for [facility] program.  Discussed a good diet and physical activity.    2.  Urinary frequency and polyuria:  not clear if just due to high glucose.  She does have BV, but will hold on  treatment until urine culture returns.    3.  Vaginal D/C:  BV but hold treatment for now as above.  GC/chlamydia pending.  4.  Hypertension:  controlled.

## 2023-12-10 LAB — GC/CHLAMYDIA PROBE AMP
Chlamydia trachomatis, NAA: NEGATIVE
Neisseria Gonorrhoeae by PCR: NEGATIVE

## 2023-12-12 ENCOUNTER — Ambulatory Visit: Payer: Self-pay | Admitting: Internal Medicine

## 2023-12-12 LAB — URINE CULTURE

## 2023-12-12 MED ORDER — CEPHALEXIN 500 MG PO CAPS: ORAL_CAPSULE | ORAL | 0 refills | Status: AC

## 2023-12-13 ENCOUNTER — Telehealth: Payer: Self-pay | Admitting: Internal Medicine

## 2023-12-13 NOTE — Telephone Encounter (Signed)
 Patient states she has a bump on labial of vagina, patient states she noticed bump a week ago after her OV with doctor , patient states bump is hard to touch and notice bump looks like it is getting bigger,  Bump hurts and feels like burning when showers or urinates.   Patient has used a cream that is over the counter penicillin  , patient noticed with the cream the bump had a blister that seemed to burst but does noticed that after that bump is getting bigger and has not applied penicillin  any more.   Patient would like to know if she needs to be seen again or what recommendations

## 2023-12-14 LAB — GLUCOSE, POCT (MANUAL RESULT ENTRY): POC Glucose: 449 mg/dL — AB (ref 70–99)

## 2023-12-14 NOTE — Congregational Nurse Program (Signed)
  Dept: [phone]   Congregational Nurse Program Note  Date of Encounter: 12/14/2023 BP 138/80 (BP Location: Left Arm, Patient Position: Sitting)   Pulse 76   LMP 05/22/2020  Pt states her cbg this morning was 150 and last night was 180. Inst pt to take her evening medication. Inst pt to return next week for another check. Landry Slocumb, RN, CCNP   Past Medical History: Past Medical History:  Diagnosis Date   Chronic chest pain    Chronic pelvic pain in female    DDD (degenerative disc disease), cervical    Depression    Diabetes mellitus    Dizziness    Fatty liver    GERD (gastroesophageal reflux disease)    H/O echocardiogram 09/2013   normal   Headache    Hypertension    Peripheral neuropathy     Encounter Details:  Community Questionnaire - 12/14/23 1729       Questionnaire   Ask client: Do you give verbal consent for me to treat you today? Yes    Student Assistance [employer]    Location Patient Presenter, broadcasting    Encounter Setting CN site    Population Status Unknown    Insurance Uninsured ([employer]/Care Connects/Self-Pay/Medicaid Family Planning)    Insurance/Financial Assistance Referral N/A    Medication N/A    Medical Provider Yes    Screening Referrals Made N/A    Medical Referrals Made N/A    Medical Appointment Completed N/A    CNP Interventions Case Management;Educate    Screenings CN Performed Blood Pressure;Blood Glucose    ED Visit Averted Yes    Life-Saving Intervention Made N/A

## 2023-12-15 ENCOUNTER — Telehealth: Payer: Self-pay | Admitting: Internal Medicine

## 2023-12-15 ENCOUNTER — Other Ambulatory Visit: Payer: Self-pay

## 2023-12-15 NOTE — Telephone Encounter (Signed)
 Patient called to report she did not get new medication prescribed to her at her recent visit.  Patient states she went to the health department to pick up medication   sitaGLIPtin  (JANUVIA ) 50 MG tablet [[id number]]   Patient states she was told by the pharmacist that medication is through a program and she will need to apply, patient states she was given a list of requirements, patient states she called us  to notify us  that she did not get medication right away and that it may take her a while to receive medication as she needs to get all documents required.

## 2023-12-20 NOTE — Telephone Encounter (Signed)
 Patient has been notified she is to take Jardiance  to the health department once she receives medication , patient is aware she needs to do this each month to continue to receive refills.   Also , notified patient she needs to apply for Januvia .  Patient agrees.

## 2023-12-20 NOTE — Telephone Encounter (Signed)
 Spoke with patient , patient states she does not need the appointment anymore as her symptoms have resolved.

## 2024-02-10 LAB — FECAL OCCULT BLOOD, IMMUNOCHEMICAL

## 2024-02-16 ENCOUNTER — Telehealth: Payer: Self-pay

## 2024-02-16 ENCOUNTER — Other Ambulatory Visit: Payer: Self-pay

## 2024-02-16 DIAGNOSIS — Z1211 Encounter for screening for malignant neoplasm of colon: Secondary | ICD-10-CM

## 2024-02-16 NOTE — Telephone Encounter (Signed)
 Called patient via [employer] [phone]. Spoke with patient about FIT test results. Lab was unable to analyze test due to a delay. Will mail patient a new test to complete. Patient voiced understanding.

## 2024-03-02 LAB — FECAL OCCULT BLOOD, IMMUNOCHEMICAL: Fecal Occult Bld: NEGATIVE

## 2024-04-14 ENCOUNTER — Telehealth: Payer: Self-pay | Admitting: Internal Medicine

## 2024-04-17 NOTE — Telephone Encounter (Signed)
 Called patient and asked if she obtained Jardiance  and Januvia  medications.  Patient states she received Jardiance  and only took it for three months and once prescription ended in December, patient  has not taken medication since then since she did not receive medication delivered to her house again.  Patient states she did not call back to pharmacy to ask , because of language barrier.  Notified patient there is interpreter at the health department pharmacy , patient states she did no know since she would never  get interpreter when she would go to pharmacy.   Patient states she has been taking medication Januvia  since beginning on January .  Patient states her approval for MAP program has ended and patient has not been able to apply again because needs tax form from husband to renew and husband has not receive form .

## 2024-04-18 ENCOUNTER — Telehealth: Payer: Self-pay | Admitting: Psychology

## 2024-06-14 ENCOUNTER — Other Ambulatory Visit: Payer: Self-pay

## 2024-06-17 ENCOUNTER — Encounter: Payer: Self-pay | Admitting: Internal Medicine
